# Patient Record
Sex: Male | Born: 1955 | Race: White | Hispanic: No | Marital: Single | Smoking: Former smoker
Health system: Southern US, Community
[De-identification: ages and names within clinical notes are randomized; demographics above are authoritative.]

## PROBLEM LIST (undated history)

## (undated) DIAGNOSIS — E785 Hyperlipidemia, unspecified: Secondary | ICD-10-CM

## (undated) DIAGNOSIS — I872 Venous insufficiency (chronic) (peripheral): Secondary | ICD-10-CM

## (undated) DIAGNOSIS — I10 Essential (primary) hypertension: Secondary | ICD-10-CM

## (undated) DIAGNOSIS — N200 Calculus of kidney: Secondary | ICD-10-CM

## (undated) DIAGNOSIS — G8190 Hemiplegia, unspecified affecting unspecified side: Secondary | ICD-10-CM

## (undated) DIAGNOSIS — G4733 Obstructive sleep apnea (adult) (pediatric): Secondary | ICD-10-CM

## (undated) DIAGNOSIS — K219 Gastro-esophageal reflux disease without esophagitis: Secondary | ICD-10-CM

## (undated) DIAGNOSIS — E669 Obesity, unspecified: Secondary | ICD-10-CM

## (undated) DIAGNOSIS — I639 Cerebral infarction, unspecified: Secondary | ICD-10-CM

## (undated) HISTORY — DX: Cerebral infarction, unspecified: I63.9

## (undated) HISTORY — DX: Essential (primary) hypertension: I10

## (undated) HISTORY — DX: Calculus of kidney: N20.0

## (undated) HISTORY — DX: Obstructive sleep apnea (adult) (pediatric): G47.33

## (undated) HISTORY — DX: Venous insufficiency (chronic) (peripheral): I87.2

## (undated) HISTORY — DX: Hyperlipidemia, unspecified: E78.5

## (undated) HISTORY — PX: LITHOTRIPSY: SUR834

## (undated) HISTORY — DX: Obesity, unspecified: E66.9

## (undated) HISTORY — PX: NASAL POLYP EXCISION: SHX2068

## (undated) HISTORY — DX: Gastro-esophageal reflux disease without esophagitis: K21.9

---

## 2000-10-17 ENCOUNTER — Encounter: Payer: Self-pay | Admitting: Urology

## 2000-10-18 ENCOUNTER — Ambulatory Visit (HOSPITAL_COMMUNITY): Admission: RE | Admit: 2000-10-18 | Discharge: 2000-10-18 | Payer: Self-pay | Admitting: Urology

## 2000-10-18 ENCOUNTER — Encounter: Payer: Self-pay | Admitting: Urology

## 2003-10-10 ENCOUNTER — Inpatient Hospital Stay (HOSPITAL_COMMUNITY): Admission: AD | Admit: 2003-10-10 | Discharge: 2003-10-12 | Payer: Self-pay | Admitting: *Deleted

## 2005-03-07 ENCOUNTER — Encounter: Admission: RE | Admit: 2005-03-07 | Discharge: 2005-06-05 | Payer: Self-pay | Admitting: Family Medicine

## 2006-11-09 ENCOUNTER — Ambulatory Visit: Payer: Self-pay | Admitting: Cardiology

## 2006-11-09 ENCOUNTER — Encounter: Payer: Self-pay | Admitting: Cardiology

## 2006-11-09 ENCOUNTER — Encounter (INDEPENDENT_AMBULATORY_CARE_PROVIDER_SITE_OTHER): Payer: Self-pay | Admitting: Neurology

## 2006-11-09 ENCOUNTER — Inpatient Hospital Stay (HOSPITAL_COMMUNITY): Admission: EM | Admit: 2006-11-09 | Discharge: 2006-12-06 | Payer: Self-pay | Admitting: Emergency Medicine

## 2006-11-19 ENCOUNTER — Ambulatory Visit: Payer: Self-pay | Admitting: Physical Medicine & Rehabilitation

## 2007-10-08 ENCOUNTER — Ambulatory Visit (HOSPITAL_BASED_OUTPATIENT_CLINIC_OR_DEPARTMENT_OTHER): Admission: RE | Admit: 2007-10-08 | Discharge: 2007-10-08 | Payer: Self-pay | Admitting: Family Medicine

## 2007-10-12 ENCOUNTER — Ambulatory Visit: Payer: Self-pay | Admitting: Internal Medicine

## 2010-08-14 ENCOUNTER — Encounter: Payer: Self-pay | Admitting: Neurology

## 2010-10-21 ENCOUNTER — Encounter: Payer: Self-pay | Admitting: Family Medicine

## 2010-10-21 DIAGNOSIS — I639 Cerebral infarction, unspecified: Secondary | ICD-10-CM | POA: Insufficient documentation

## 2010-10-21 DIAGNOSIS — G4730 Sleep apnea, unspecified: Secondary | ICD-10-CM

## 2010-10-21 DIAGNOSIS — E119 Type 2 diabetes mellitus without complications: Secondary | ICD-10-CM

## 2010-10-21 DIAGNOSIS — G4733 Obstructive sleep apnea (adult) (pediatric): Secondary | ICD-10-CM | POA: Insufficient documentation

## 2010-10-21 DIAGNOSIS — I10 Essential (primary) hypertension: Secondary | ICD-10-CM

## 2010-10-21 DIAGNOSIS — E785 Hyperlipidemia, unspecified: Secondary | ICD-10-CM

## 2010-12-06 NOTE — Procedures (Signed)
NAME:  Eduardo Young, Eduardo Young               ACCOUNT NO.:  [id number]   MEDICAL RECORD NO.:  [id number]          PATIENT TYPE:  OUT   LOCATION:  SLEEP CENTER                 FACILITY:  [facility]   PHYSICIAN:  Clinton D. Annamaria Boots, MD, FCCP, FACPDATE OF BIRTH:  06/01/58   DATE OF STUDY:  10/08/2007                            NOCTURNAL POLYSOMNOGRAM   REFERRING PHYSICIAN:   INDICATIONS FOR STUDY:  Hypersomnia with sleep apnea.   EPWORTH SLEEPINESS SCORE:  15/24.   BMI 32, weight 260 pounds, height 76 inches.  Neck:  18.5 inches.  </MEDI  CATIONS/>  Home medications are charted and reviewed.   SLEEP ARCHITECTURE:  Total sleep time:  239 minutes with sleep  efficiency 58.5%.  Stage 1 was 17.5%, stage 2 72%, stage 3 absent, REM  10.4% of total sleep time.  Sleep latency 31 minutes.  REM latency 344  minutes.  Awake after sleep onset 133 minutes.  Arousal index 39.3.   Bedtime medication included Glucophage, Zocor, Detrol LA.   RESPIRATORY DATA:  Apnea hypopnea index (AHI) is 78.2 events per hour  indicating severe obstructive sleep apnea/hypopnea syndrome.  There were  312 scored events including 53 obstructive apneas, 2 central apneas, 142  mixed apneas and 115 hypopneas.  Most events were associated with supine  sleep position.  REM AHI 52.8.  The patient qualified for CPAP titration  by split protocol.  He tolerated initial fitting for a large Mirage  Quattro full face mask at the beginning of the study.  When he had  qualified, and the technician began applying CPAP pressure, the patient  became anxious and complained of dyspnea despite multiple pressure and  mode adjustments including trial of bilevel.  Therefore, CPAP efforts  were discontinued, and the study was concluded as a diagnostic MPSG  protocol.   OXYGEN DATA:  Very loud snoring with oxygen desaturation to a nadir of  54%.  Mean oxygen saturation through the study was 90.8%.  A total of  61.3 minutes were spent with oxygen  saturation less than 61%.   CARDIAC DATA:  Sinus rhythm with frequent PVCs.   MOVEMENT/PARASOMNIA:  No movement disturbance.  Six restroom visits  including 2 urinals.   IMPRESSION/RECOMMENDATION:  1. Severe obstructive sleep apnea/hypopnea syndrome, AHI 78.2.  Mostly      was supine.  Snoring was very loud with significant oxygen      desaturation.  2. The patient qualified for CPAP titration but was unable to tolerate      initial application of pressures despite technician efforts.      Recommend that the patient return for daytime assistance at the      sleep center where the technician can work with him to desensitize      for CPAP and complete CPAP titration.  This will be arranged.  The      patient has some timing needs related to insurance that we will try      to expedite on his behalf.  3. Note significant oxygen desaturation with 61.3 minutes spent with      oxygen saturation less than 88% and a saturation nadir of 54%.  Supplemental oxygen with CPAP is likely to be helpful.  4. Technician indicates that the patient made 6 restroom trips and      required 2 urinals suggesting that nocturia related to his medical      problems and medication may be contributing to sleep disruption.      It may be helpful at least to change the time of day which      medications are taken if that is clinically applicable.      Clinton D. Annamaria Boots, MD, [facility], FACP  Diplomate, Tax adviser of Sleep Medicine  Electronically Signed     CDY/MEDQ  D:  10/12/2007 09:07:48  T:  10/12/2007 10:02:29  Job:  [id number]

## 2010-12-09 NOTE — Discharge Summary (Signed)
NAME:  Eduardo Young, Eduardo Young               ACCOUNT NO.:  [id number]   MEDICAL RECORD NO.:  [id number]          PATIENT TYPE:  INP   LOCATION:  3017                         FACILITY:  [facility]   PHYSICIAN:  Pramod P. Leonie Man, MD    DATE OF BIRTH:  Apr 18, 1956   DATE OF ADMISSION:  11/08/2006  DATE OF DISCHARGE:                               DISCHARGE SUMMARY   ADDENDUM   The patient was initially admitted November 09, 2006 and discharged Dec 06, 2006.   Initial discharge summary completed Nov 23, 2006 with diagnosis listed  there.  Since that dictation, the patient has had an additional modified  barium swallow which has increased him to a carb modified dysphagia-3  thin liquid diet.  The patient has also maintained continued elevated  blood pressures for which blood pressure medication adjustments have  been made.  Please see list below.  At this point he is medically stable  for discharge with no significant change in his neurologic exam.  He  needs continuation of PT, OT and speech therapy, a skilled nursing home  facility and likely long-term placement.   DISCHARGE MEDICATIONS:  Catapres patch 0.2 mg per 24 hours changed q.7  days on Tuesdays.  Glucophage 500 mg b.i.d.  Cardizem 240 mg a day.  Allopurinol 100 mg b.i.d.  Charcoal 48 mg a day.  Normodyne 300 mg b.i.d.  Lantus 28 units b.i.d.  Lisinopril 20 mg b.i.d.   There have been ongoing talks with patient's brother, who is power of  attorney, and sister surrounding his future discharge plans.  They are  in agreement with skilled nursing placing and arrangements are in place  for transfer today.      Burnetta Sabin, N.P.    ______________________________  Kathie Rhodes. Leonie Man, MD    SB/MEDQ  D:  12/06/2006  T:  12/06/2006  Job:  [id number]   cc:   Pramod P. Leonie Man, MD  Darletta Moll Bubba Hales, M.D.

## 2010-12-09 NOTE — Discharge Summary (Signed)
NAME:  Eduardo Young, Eduardo Young                         ACCOUNT NO.:  [id number]   MEDICAL RECORD NO.:  [id number]                   PATIENT TYPE:  INP   LOCATION:  2008                                 FACILITY:  [facility]   PHYSICIAN:  Jon Gills, M.D.             DATE OF BIRTH:  Dec 02, 1955   DATE OF ADMISSION:  10/09/2003  DATE OF DISCHARGE:  10/12/2003                                 DISCHARGE SUMMARY   DISCHARGE DIAGNOSES:  1. Atypical chest pain, most likely a combination of GI and/or     musculoskeletal in origin with negative cardiac enzymes and negative     Cardiolite.  2. High risk for coronary artery disease with strong family history in both     parents and both grandfathers, and also patient with significant risks,     including obesity, hypertension, some suggestion of impaired glucose     tolerance and hyperlipidemia.  3. Hyperlipidemia.  4. Gastroesophageal reflux disease.  5. Mild hypokalemia, based on his laboratory workup, thought secondary to     his diuretic intake.  6. Pulmonary nodules, as noted on CT scan, which will need followup in the     next two or three months.   DISCHARGE MEDICATIONS:  1. Zocor 20 mg p.o. q.h.s.  2. Ecotrin 81 mg p.o. q. day.  3. Norvasc 5 mg p.o. q. day.  4. Benicar/HCTZ  40/25 one p.o. q. day.  5. Lopressor 25 mg p.o. b.i.d.  6. Niacin 500 mg p.o. q. day.  He is to take his usual aspirin dose one-half     hour before niacin to minimize flushing.  7. Protonix 40 mg p.o. q. day and he can take this for another month since     his reflux is only intermittent.   ACTIVITY:  No restrictions.  The patient has been encouraged to get some  exercise into his daily routine.   DISCHARGE DIET:  Low salt, low cholesterol with possibility of diabetes type  related diet.   FOLLOWUP INSTRUCTIONS:  The patient is to follow up with Dr. Bubba Hales, his  primary care physician with [facility].  The patient told me at the time of  admission that he actually  had a visit on the 29th, but he tells me today  that he actually cancelled the appointment; therefore, I have advised him to  reschedule that appointment in the next one to two weeks, since Dr. Bubba Hales  has the results of the glucose tolerance test that he had done.  I am fairly  certain that this patient will test positive; if not now, but in the near  future for diabetes if he does not make any significant changes in his  lifestyle.   HOSPITAL COURSE:  This patient was admitted initially by mistake to the  [facility] and subsequently we at [facility] took over his care.  He had come in with complaints of epigastric chest  pain, described as  pushing in nature.  He said it radiated then to his left shoulder and down  into his arm.  In the arm, it was more like numbness.  He also had a sense  of numbness in his left leg.  He did get some diaphoresis, but denied any  other accompanying symptoms.  He denied any chest tightness or pressure,  like what he had when he first presented with elevated blood pressure and  was diagnosed with hypertension.  The patient has significant cardiac risk  factors, including his age and gender and also a very strong family history,  as previously mentioned.  He also has hypertension, hyperlipidemia, obesity  and a very strong suggestion for impaired glucose tolerance.  The patient  was admitted and placed on telemetry.  He ruled out for a myocardial  infarction by serial enzymes and all of his CPKs were less than 100 and  troponins were insignificant also.  He underwent a Cardiolite stress test  and Dr. Martinique just gave me information that that was negative for any  ischemic changes.  There was some suggestion of low attenuation per what he  told me in the inferior wall, but no significant defects were noted.  He had  an EF of 53% with normal wall movements.  Because of the nature of his  complaints, I did do a CT of the chest, abdomen and pelvis to  rule out  aortic dissection and that was negative for aortic dissection.  However, of  mention was pulmonary nodules that were scattered in the lung.  The  recommendation is that he have repeat CT scan in two to three months.   For his hypertension, when he first came in, he was unsure of the medication  doses for his Benicar and hydrochlorothiazide and he was unsure what his  blood pressure medications were.  Therefore, once we found that out, the  patient was switched to Benicar and hydrochlorothiazide, but kept on  metoprolol, but decreased the dose down to 25 p.o. b.i.d.  We also restarted  him back on his Norvasc.  His blood pressure at the time of discharge is  more reasonable at 150/90.  It was certainly more elevated at the time when  he first came in.   The patient's fasting lipid panel was checked and that has come back  elevated with a total cholesterol of  227, triglyceride of 226, HDL of 25,  LDL of 157.  For this reason and because of his risk factors, he was started  on both Zocor and niacin.  Hopefully, this combination will be sufficient in  bringing all of the appropriate numbers down.  It is possible that he may  need a fibric acid group like TriCor or Lopid added to his regimen or his  niacin increased.  We will leave this to the discretion of his primary care  physician.   For his gastroesophageal reflux disease, which he described as  intermittently only, he may go through periods where he has no symptoms and  then he may have it for three or four days at a time.  Because of the  atypical nature in which he presented, we placed him on Protonix and I  advised the patient to complete out the month's treatment at least.  After  stopping the Protonix, if he has recurrence of symptoms, we will leave it to  the discretion of his primary care physician whether he will need to have  any further GI workup prior to just placing him back on a proton pump  inhibitor.   In  regards to the question of diabetes, the patient had described what  sounded like a glucose tolerance test that was done by his primary care  physician.  We do not have the results of it.  We did get fasting sugars  checks on him and they have been variable, some that were higher than 126,  as this morning's was at 132.  However, some of his certainly have been  greater than 100.  His hemoglobin A1P was 6.6, so certainly not quite at the  criteria to make the diagnosis of diabetes mellitus.  I have advised the  patient once again to make sure that he reschedules his appointment with his  primary care physician to endure that the issue of whether he is a diabetic  or not be addressed and to make sure that all things are going well post  hospitalization.   For his mild hypokalemia, he has been given replacements and on the day of  discharge, his potassium was 3.4, which was minimally decreased.  We will  give him KCL, a total of 80 mEq over a course of about a half an hour to 45  minutes, which should be sufficient in getting his potassium back up.  I  would recommend a BMP at the time of followup with his primary care  physician.   Once again, the patient was stressed the importance of risk modification for  minimizing his risks for coronary artery disease.  I have told him that he  is amazingly at high risk with all of his risk factors.  The patient, I  hope, takes it to heart and makes some significant change in his lifestyle,  which I understand he has already done some thereof.   Dr. Martinique, the cardiologist, was consulted on his case and he is the one  who gave [place] the information on his Cardiolite stress test.   The patient is being discharged to home in stable condition.  He has friends  here who were visiting with him.                                                Jon Gills, M.D.    RRV/MEDQ  D:  10/12/2003  T:  10/14/2003  Job:  [id number]   cc:   Darletta Moll. Bubba Hales,  M.D.  [address]  [place]  [place] [address]  Fax: [phone]

## 2010-12-09 NOTE — H&P (Signed)
NAME:  ADDEN, STROUT NO.:  [id number]   MEDICAL RECORD NO.:  [id number]          PATIENT TYPE:  EMS   LOCATION:  MAJO                         FACILITY:  [facility]   PHYSICIAN:  Jill Alexanders, M.D.  DATE OF BIRTH:  [birth year]   DATE OF ADMISSION:  11/08/2006  DATE OF DISCHARGE:                              HISTORY & PHYSICAL   HISTORY OF PRESENT ILLNESS:  Eduardo Young is a 55 year old left-handed  white male born 06-Jun-1956 with a history of hypertension, diabetes,  dyslipidemia who comes to the [facility] emergency room after being  found by his family at home.  The patient was last seen normal on November 06, 2006.  The patient was to go to his brother's house to watch TV but  never showed up.  The brother went to the patient's house broke in and  found him on the floor having urinated, aphasic, with right-sided  weakness.  The patient was brought to the hospital for evaluation.  CT  scan of the brain shows a 50 x 27 x 45 mm intracranial hemorrhage in the  left basal ganglia area.  Patient is being admitted at this point for  further evaluation.  NIH stroke scale is 16.   PAST MEDICAL HISTORY:  The past medical history is significant for:  1. 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  2. Hypertension.  3. Diabetes.  4. Obesity.  5. Gout.  6. Renal calculi.  7. Gastroesophageal reflux disease.  8. Atypical chest pain in the past.  9. Dyslipidemia.  10.History of pulmonary nodules by previous examination.   MEDICATIONS:  The patient is on allopurinol 100 mg b.i.d., diltiazem 240  mg daily, hydrochlorothiazide 12.5 mg daily, lisinopril 20 mg b.i.d.,  metformin 500 b.i.d., possibly is on Sular 40 mg daily, labetalol 200 mg  twice daily, aspirin 325 mg daily.   ALLERGIES:  Patient has no known allergies.   HABITS:  He does not smoke.  He drinks alcohol on occasion.  Has in the  past used cocaine  and marijuana.  The family does not believe he is  currently using this.   PRIMARY CARE PHYSICIAN:  Primary medical doctor is Granville Lewis, M.D.   SOCIAL HISTORY:  This patient is divorced x2, lives alone in the  [place],  [place] area and has no children.  The patient has  not worked since November 2007.  He used to be an Medical illustrator.   FAMILY HISTORY:  Family medical history notable in that the mother died  with an MI at age 57.  Father died of stroke at the age of 37.  The  patient has three sisters and one brother, history of hypertension,  diabetes in the family, high cholesterol in the family.   REVIEW OF SYSTEMS:  The review of systems is essentially unobtainable.  The patient is nonverbal.  The family knows that in the last week the  patient had a bout of kidney stones with severe pain with this.  Otherwise, the review  of systems is unknown.   PHYSICAL EXAMINATION:  VITALS:  Blood pressures 205/106, heart rate 88,  respiratory rate 20, temperature 99.4 rectally.  GENERAL:  In general, this patient is a moderately obese white male who  is sleepy but can be easily aroused at the time examination.  HEENT:  On examination the head is atraumatic.  Eyes: Pupils are equal,  round and react to light.  Disks are flat bilaterally.  NECK:  Neck is supple.  No carotid bruits noted.  RESPIRATORY:  Examination is clear.  CARDIOVASCULAR:  Examination reveals a regular rate and rhythm.  No  obvious murmurs or rubs noted.  EXTREMITIES:  Extremities are without significant edema.  ABDOMEN:  Is obese, positive bowel sounds.  No organomegaly or  tenderness noted.  NEUROLOGICAL:  On neurologic examination the cranial nerves are as  above.  Mild depression of the right nasolabial fold noted.  Patient has  full extraocular movements in horizontal plane; does not really blink to  threat very well from either side.  The patient has a plegic right upper  extremity, has good strength in  the left upper extremity.  There is  drift on the right lower extremity.  The patient however is able to  maintain the leg up off the bed at the count of 5.  The patient has good  strength on the left leg.  Sensory testing is difficult.  The patient  does not really respond well on either side to deep pain stimulation but  probably less well on the right as compared to the left.  Deep tendon  reflexes are fairly symmetric.  There appears to be an upgoing toe on  the right and downgoing the left.  Patient is able to perform finger-  nose-finger with the left arm and heel-to-shin with left leg. He cannot  perform this well on the right side.  The patient has no obvious ataxia.  The patient could not be ambulated.   LABORATORY:  Values notable for white count of 13.1, hemoglobin of 14.1,  hematocrit 40.6, MCV of 79.2, platelets of 259,000.  INR 1.0.  Sodium  136, potassium 3.7, chloride 104, CO2 25, glucose of 247, BUN of 14,  creatinine 1.24.  Calcium 8.4. CK level of 732. Troponin- I less than  0.05, MB fraction 2.9.  Urinalysis reveals specific gravity of 1.029.  Urine glucose greater than 1000 mg/dL, 15 mg/dL ketones, moderate blood,  protein greater than 300, 0-2 white cells, 3-6 red cells.   CLINICAL DATA:  A CT of the head is as above.  Chest x-ray and EKG are  pending.  Urine drug screen pending.   IMPRESSION:  1. Left basal ganglia intracranial hemorrhage without any      intraventricular extension.  2. Hypertension.  3. Diabetes.  4. Dyslipidemia.   DISCUSSION:  This patient clearly has a significant deficit with right  hemiparesis and aphasia.  NIH stroke scale is 16.  Patient is not a TPA  candidate due to intracranial hemorrhage.  The patient was last seen  normal on November 06, 2006.  The patient is brought in for further  evaluation at this point.   PLAN:  1. Admission to [facility] intensive care unit 3100 bed. 2. Repeat CT scan in 12 hours.  3. Blood  pressure control.  4. N.p.o. for now.  5. Bedside swallow protocol.  6. Urine drug screen.  7. Physical and occupational therapy evaluation.  8. To get chest x-ray and EKG.  9. Follow the patient's clinical course while in-house.      Jill Alexanders, M.D.  Electronically Signed     CKW/MEDQ  D:  11/09/2006  T:  11/09/2006  Job:  [id number]   cc:   Darletta Moll. Bubba Hales, M.D.

## 2010-12-09 NOTE — H&P (Signed)
NAME:  Eduardo Young, Eduardo Young                         ACCOUNT NO.:  [id number]   MEDICAL RECORD NO.:  [id number]                   PATIENT TYPE:  INP   LOCATION:  1823                                 FACILITY:  [facility]   PHYSICIAN:  Jon Gills, M.D.             DATE OF BIRTH:  Jul 04, 1956   DATE OF ADMISSION:  10/10/2003  DATE OF DISCHARGE:                                HISTORY & PHYSICAL   ID STATEMENT:  This is a 55 year old gentleman whose primary care physician  is Dr. Bubba Hales with [facility].  His significant past medical history is for  hypertension.   CHIEF COMPLAINT:  Epigastric pain with radiation to his left upper extremity  with numbness and into his left leg.   HISTORY OF PRESENT ILLNESS:  The patient notes that last night he was  sitting and watching TV when he developed the above mentioned symptoms.  The  patient says the epigastric pain feels like something pushing up into it.  The patient then notes that the pain radiated to his left arm and became a  numbness into his left upper extremity and into his left leg.  He did get  some diaphoresis with it but denies any shortness of breath.  The patient  has not had similar symptoms but felt that he has had heartburn episodically  and he may go through periods of three or four days at a time when he may  have significant heartburn and he may take Tums or any other over-the-  counter preparations.   The patient had a sense of chest pressure during the time of diagnosis of  hypertension but he has not had this at all with these current symptoms.   CARDIAC RISK FACTORS:  His gender, his age.  He has a very strong family  history consisting of both his parents dying at the age of 65 from a heart  attacks.  Both paternal grandfathers also had heart attacks in their 27s.  He does have a history of hypertension.  He is not a smoker although he does  marijuana.  He denies any cocaine use.  He is unsure of his cholesterol  value nor his  diabetes status, although he describes what sounds like a 2-  hour glucose tolerance test.   ALLERGIES:  NO KNOWN DRUG ALLERGIES.   CURRENT MEDICATIONS:  The patient says he is on a combination of  Benicar/hydrochlorothiazide which was just started two days ago.  He was  taken off of Trandate at the same time.  He was also taken off of Sular  about six months ago but he cannot remember the name of the medication it  was replaced with but he says it was a once a day dosing.  He also was just  prescribed colchicine and allopurinol for recent acute gout but he has not  started on these.  He does not take any regular aspirin.  PAST MEDICAL HISTORY:  Significant for hypertension and he says he recently  had some extensive blood work and he is scheduled to see Dr. Bubba Hales in  clinic next week for an extensive physical exam.   SOCIAL HISTORY:  He is not married.  He works as an Medical illustrator.  He  seldom drinks alcohol, he says; the brother thought he drank more, but the  patient certainly denies that he may have a few drinks once a month or so.  He does not smoke cigarettes and has not done so since the early 20s.  He  does do marijuana, denies any cocaine use.   FAMILY HISTORY:  There are four siblings.  Both his parents died in their  late 36s from myocardial infarction, also both his paternal grandfathers  died in their 21s from myocardial infarction.  There is diabetes mellitus in  the father and question of whether the mother had it.  The mother was obese.  There is hypertension in everybody.  There is stroke also in the early 26s  in the father.  There is no known prostate, colon, or breast cancer.   REVIEW OF SYSTEMS:  As per HPI.  He otherwise denies a cough, denies any  melena or hematochezia.  Denies any urinary symptoms except he says over the  past day or so he has had some frequency of urination for which he thinks is  related to increase of some of his fluids.   PHYSICAL  EXAMINATION:  VITAL SIGNS:  His latest blood pressure was 180/111,  he had just received metoprolol.  His pulse early on was 58, but it has  ranged from 80-88.  He is saturating at 98% on room air.  GENERAL:  He is in no apparent distress.  He had significant abdominal  obesity.  He is a very tall man.  HEENT:  Sitting upright he has a fat neck.  I did not check the JVD at this  level.  LUNGS:  Clear to auscultation bilaterally without any crackles or wheezes.  HEART:  Regular rate and rhythm.  ABDOMEN:  Significantly obese.  Bowel sounds are normal, soft, nontender in  all quadrants.  No epigastric tenderness either.  LOWER EXTREMITIES:  He has no edema.   LABORATORY WORKUP:  His electrolytes on I-stat showed a sodium of 142,  potassium 3.3, chloride 103, glucose 159.  His white count was 12,200, H&H  of 13.6 and 40.1, MCV is 80.5, MCHC 33.9, platelet count 372,000.   His chest x-ray showed no acute process.  I believe there was no  cardiomegaly noted also.   His initial cardiac markers were negative but this troponin at less than  0.05.  His repeat CPK came back at 91.  The troponin was ordered but I still  do not see one.  It was to have been done with the CPK at 9:20 this morning.  We will go ahead and do one now which is almost time for his next CPK to be  done.   His EKG showed normal sinus rhythm with heart rate of about 78.  The only  abnormality that I see is very minor ST suppression in the lateral leads.   ASSESSMENT/PLAN:  1. Atypical chest pain but with significant cardiac risk factors with very     strong family history with him being hypertensive and obese, question of     diabetes mellitus.  The patient will be admitted for the rule out MI  protocol which sounds like he has ruled out.  One of my concerns is also     for the nature of his complaint, whether he could have dissection of the    aorta so we will set him up for a CT of the chest.  In the meantime, we      will do the usual beta blockers which he has already received in the ER     for his blood pressure.  Continue his aspirin.  We will hold on Plavix at     that time.  We will place him on O2, give him nitroglycerin as needed.  I     have called and spoken to Dr. Martinique who will also come in and see     patient.  The plan is to at this point in time set him up for Cardiolite     stress test rather than doing CT per my discussion over the phone with     Dr. Martinique; this may change depending on what Dr. Martinique thinks.  We will     also do a fasting lipid panel in the morning and make adjustments based     on that.  The patient has been given the importance of risk modifications     considering his high risk for atherosclerosis and the patient seems to     verbalize  understanding of this.  2. Hypertension.  He has been started on beta blockers.  It is possible that     his blood pressure is elevated primarily because he was taken off of the     labetalol  so we are seeing some effect of that.  We will also place him     on an ACE inhibitor.  I am holding off on his hydrochlorothiazide at this     point in time with concerns of his acute gout history.  3. Some of his symptoms may suggest some reflux, therefore, I will do     Protonix b.i.d. dosing for a few days and then come back to once a day     dosing.  4. In regards to whether he might have diabetes mellitus with some frequency     of urination, we will do a UA just to make sure he does not have a     urinary tract infection if one has not been done and I do not see one.     In the meantime, we will check his sugar on the BMP in the morning and     see what it is.  I might set him up for a     hemoglobin A1c also __________ , what sounds like a most recent 2-hour     glucose tolerance test.  5. In regards to his recent gout.  We will place him on colchicine and see     how he does.  He says he normally gets his gout attacks in the feet  and     hands.                                                Jon Gills, M.D.    RRV/MEDQ  D:  10/10/2003  T:  10/12/2003  Job:  [id number]   cc:   Darletta Moll. Bubba Hales, M.D.  [address]  [place]  [place] [address]  Fax: [phone]

## 2010-12-09 NOTE — Discharge Summary (Signed)
NAME:  Eduardo Young, Eduardo Young               ACCOUNT NO.:  [id number]   MEDICAL RECORD NO.:  [id number]          PATIENT TYPE:  INP   LOCATION:  3017                         FACILITY:  [facility]   PHYSICIAN:  Legrand Como L. Reynolds, M.D.DATE OF BIRTH:  Jun 03, 1956   DATE OF ADMISSION:  11/08/2006  DATE OF DISCHARGE:  11/23/2006                               DISCHARGE SUMMARY   ADMISSION DIAGNOSES:  1. 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  2. Hypertension.  3. Diabetes.  4. Dyslipidemia.  5. Obesity.  6. Gout.  7. History of renal calculi.  8. History of gastroesophageal reflux disease.  9. History of pulmonary nodules.   DISCHARGE DIAGNOSES:  1. 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  2. Hypertension.  3. Diabetes.  4. Dyslipidemia.  5. Obesity.  6. Gout.  7. History of renal calculi.  8. History of gastroesophageal reflux disease.  9. History of pulmonary nodules.   DIET AT DISCHARGE:  Dysphagia 3 nectar thick liquids, moderate carb  modified with small sips and supervision.   ACTIVITY AT DISCHARGE:  Out of bed with assistance only.   MEDICATIONS AT DISCHARGE:  1. Catapres patch 0.2 mg weekly.  2. Glucophage 500 mg b.i.d.  3. Lisinopril 10 mg b.i.d.  4. Cardizem CD 240 mg daily.  5. Allopurinol 100 mg b.i.d.  6. Tri-Chlor 48 mg daily.  7. Labetalol 300 mg b.i.d.  8. Lantus insulin 25 units b.i.d.  9. Sliding scale insulin.   STUDIES:  1. CT of the head performed November 09, 2006 demonstrating focal      hematoma in the left cerebral hemisphere and the basal ganglia      measuring 5.2 x 2.7 cm with 6 mm midline shift.  No      intraventricular extension and no hydrocephalus.  2. Serial CTs on April 18, April 20 and April 22 demonstrating      stability of the intracerebral hematoma.  3. Serial chest x-rays April 18, April 21 and April 22 demonstrating  cardiomegaly without acute chest disease.  4. 2D echocardiogram done November 09, 2006 demonstrating normal EF, 55%      to 85% normal LV systolic function, limited windows but no definite      wall motion abnormalities.  5. Carotid Doppler November 09, 2006 demonstrating 40% to 50% ICA      stenosis low in the scale in the proximal ICA region, likely due to      tortuosity rather than plaque.   LABORATORY REVIEW:  CBC on admission:  White count 13.1, hemoglobin  14.1, platelets 259,000 with white counts in the 10-13 range throughout  admission.  Serial chemistries demonstrating elevated glucose's, mildly  elevated BUN with stable creatinine, hemoglobin A1c 10.6, homocysteine  level normal at 14.5.  Lipid panel:  Triglycerides elevated at 309, HDL  low at 20, total cholesterol 191.  Urinalysis on admission demonstrating  hemoglobin, much glucose, no evidence of infection.  Urinalysis on April  22 demonstrating evidence of a urinary tract  infection.  Blood cultures  on April 21 demonstrating no growth.   HOSPITAL COURSE:  Please see admission History and Physical by Dr. Floyde Parkins on November 08, 2006 for full admission details.  Briefly, this is a  55 year old left-handed man with a history of diabetes, hypertension and  dyslipidemia who was found down by his family at home with aphasia and  right hemiparesis with an unknown time of onset.  He was brought to the  emergency department.  CT of the head demonstrated a 5 x 2.5 x 4.5 cm  intracranial hemorrhage in the left basal ganglia area with midline  shift.  He was admitted to the Stroke Service.  He had serial CT scans  which demonstrated stability of the hematoma.  He was initially admitted  to the ICU but transferred to Step-Down on April 19.  Routine stroke  workup was performed and was as noted above.  He had significantly  elevated blood pressures throughout his early hospitalization which were  controlled with an intravenous Cardene  drip.  He was seen at this point  by physical, occupational and speech therapy and underwent swallow  evaluation which he initially failed.  He was able to take some p.o.'s,  but he was noted to be fairly impulsive and having difficulty doing  that.  He was transferred to the floor on November 12, 2006, but shortly  after arriving there was found to be tachypneic and febrile.  He was  transferred back to 3100 where he was started on antibiotics for  presumptive pneumonia or aspiration pneumonitis.  He was started on  vancomycin and Rocephin.  Urinalysis demonstrated evidence of infection.  His white count was slightly elevated.  Chest x-ray remained clear  without evidence of CHF.  He also had some swelling of the left knee and  had an effusion on his x-ray.  He remained on Zosyn and was placed on  prednisone taper and Ultram for presumed gout in the knee.  He was  transferred back to the floor on April 23, at which time his knee was  feeling better.  His IV antibiotics were discontinued and he was started  on Bactrim for UTI, which he remained on through Nov 22, 2006.  On November 16, 2006 he pulled out his Loni Muse and was refusing reinsertion.  At that  time he was started on a dysphagia 1 diet and actually was able to work  up to a dysphagia 3 diet with Speech Pathology clearance.  He was  evaluated by the Inpatient Rehab Service but was thought not to be a  good rehab candidate because he could not be cared for by his family at  home and therefore Skilled Nursing placement was entertained.  His blood  pressure remained well controlled on oral medications.  He did tolerate  his diet reasonably well, although he was noted still to be impulsive  and required very close supervision.  He was noted by Occupational  Therapy on November 21, 2006 to have a possible anterior subluxation of his  right shoulder.  By Nov 22, 2006 he had received a bed offer and on Nov 23, 2006 he was deemed medically stable and  ready for discharge.  Discharge is anticipated on this date.      Michael L. Doy Mince, M.D.  Electronically Signed    MLR/MEDQ  D:  11/23/2006  T:  11/23/2006  Job:  [id number]

## 2010-12-09 NOTE — H&P (Signed)
NAME:  Eduardo Young, Eduardo Young                         ACCOUNT NO.:  [id number]   MEDICAL RECORD NO.:  [id number]                   PATIENT TYPE:  INP   LOCATION:  2008                                 FACILITY:  [facility]   PHYSICIAN:  Annette Stable, M.D.               DATE OF BIRTH:  [birth year]   DATE OF ADMISSION:  10/09/2003  DATE OF DISCHARGE:                                HISTORY & PHYSICAL   PRIMARY PHYSICIAN:  Philipp Deputy, M.D.   CHIEF COMPLAINT:  Epigastric and retrosternal pain.   HISTORY OF PRESENT ILLNESS:  Patient is 55 year old Caucasian gentleman who  experienced epigastric pain radiating up his chest and into his left arm  while watching TV at about 2200 hours last night.  He describes the pain as  mild and rates it on an analog scale as 2/10.  Pain was very short lasting  and fleeting in nature.  He was however, concerned about the numbness of his  left arm which was associated with the pain.  He also states that he broke  out in a sweat.  He denies any nausea or vomiting.  He had no shortness of  breath.  He has not had this experience before.  He was accompanied by two  friends to the emergency room to be seen.   PAST MEDICAL HISTORY:  1. Hypertension.  2. Gouty arthritis.  He has recently been seen for gout that involves mainly     his left arm and wrist.   ALLERGIES:  NKDA.   MEDICATIONS:  He takes two blood pressure medications.   FAMILY HISTORY:  Both parents died from coronary disease in their 25s.  His  father also suffered multiple CVAs.   SOCIAL HISTORY:  Patient is an Medical illustrator.  He denies any smoking or  use of alcohol.   REVIEW OF SYSTEMS:  GENERAL:  Negative for malaise, fever, chills.  NEURO:  No headaches.  CARDIORESPIRATORY:  As stated above in history of present  illness.  GASTROINTESTINAL:  Gastric pain; no nausea/vomiting, no change in  bowel habits.  GENITOURINARY:  No dysuria or hematuria.  MUSCULOSKELETAL:  Pain of gout intermittently  in left wrist as well as other joints.  All  other systems reviewed negative.   PHYSICAL EXAMINATION:  GENERAL:  This is an obese middle-aged adult laying  supine; no acute distress.  VITAL SIGNS:  Temperature is 97.8, blood pressure 149/86, heart rate 80,  respiratory rate 18 per minute.  HEENT:  Exam unremarkable.  NECK:  Obese, supple; no distended veins, no carotid bruits.  LUNGS:  Good bilateral air movement, clear.  CARDIOVASCULAR:  Heart sounds 1 and 2 heard; no murmurs or gallops.  ABDOMEN:  Obese, soft, nontender, normal bowel sounds.  CNS:  Alert/oriented x3 with no focal deficits.  EXTREMITIES:  No pedal edema; good peripheral pulses.   LABORATORIES:  Patient's EKG is in normal sinus rhythm with subtle  ST  depressions in lateral leads and inverted T waves in V6.  White count is  slightly elevated at 12,000, hemoglobin 13, hematocrit 40, platelet count  332.  Troponin is less than 0.05, other cardiac markers are negative.   ASSESSMENT AND PLAN:  Chest pain:  Patient's description of pain is somewhat  atypical for coronary disease.  He currently remains painfree.  I have  ordered four baby aspirins to be given to him down in the emergency room.  He will be admitted to a telemetry bed for observation.  We will rule him  out for myocardial ischemia by serial enzymes and order a stress Cardiolite  exam for him after he rules out.  I have also ordered a fasting lipid panel  on him.  His blood pressure pills are unknown and I will start him on a beta  blocker for blood pressure control pending confirmation of his usual  medications.  Patient remains afebrile and has a slightly elevated white  count which may be stress induced.  There is currently no source of  infection identified.  He has a pending portable chest x-ray.  My suspicion  for any respiratory infection is however, very low.                                                Annette Stable, M.D.    ES/MEDQ  D:   10/10/2003  T:  10/12/2003  Job:  [id number]   cc:   Harden Mo, M.D.  [place] [address]  [place]  [place] [address]  Fax: [phone]

## 2010-12-09 NOTE — Consult Note (Signed)
NAME:  Eduardo Young, Eduardo Young                         ACCOUNT NO.:  [id number]   MEDICAL RECORD NO.:  [id number]                   PATIENT TYPE:  INP   LOCATION:  2008                                 FACILITY:  [facility]   PHYSICIAN:  Peter M. Martinique, M.D.               DATE OF BIRTH:  07-21-1956   DATE OF CONSULTATION:  10/10/2003  DATE OF DISCHARGE:                                   CONSULTATION   HISTORY OF PRESENT ILLNESS:  Mr. Eskew is a 55 year old white male with a  history of obesity, gout and hypertension who presented to the emergency  room last evening.  The patient had eaten a meal and was smoking marijuana  when he developed a boring type epigastric pain radiating to his left  shoulder.  He had no associated chest pain.  He then developed tingling in  his left arm and hand and tingling of his left thigh.  He had no nausea,  vomiting or diarrhea.  He did have diaphoresis.  He denied shortness of  breath.  These symptoms lasted 30-45 minutes and resolved.  He states he is  now pain-free.   PAST MEDICAL HISTORY:  Is significant for:  1. Hypertension.  2. Gout.  3. Renal calculi.  4. Obesity.  5. GERD.  6. Glucose intolerance.   CURRENT MEDICATIONS INCLUDE:  Benicar HCT, colchicine and allopurinol.   ALLERGIES:  NO KNOWN ALLERGIES.   SOCIAL HISTORY:  He is single.  He denies tobacco use.  He does smoke  occasional marijuana.  He denies alcohol use.   FAMILY HISTORY:  Is positive for coronary disease in both parents in their  early 32s and also his grandparents.   REVIEW OF SYSTEMS:  Is otherwise unremarkable.  He has had some recent gouty  symptoms.  He has had previous stone extraction for renal calculi.  Other  review of systems are negative.   PHYSICAL EXAM:  Patient is an obese young white male in no apparent  distress.  Blood pressure 145/92, pulse is 88, he is afebrile, respirations  are 20.  HEENT EXAM:  Pupils equal, round, reactive.  Conjunctiva are clear.  Oropharynx is clear.  NECK:  Is without JVD, adenopathy or bruits.  LUNGS:  Are clear.  CARDIAC EXAM:  Reveals regular rate and rhythm without murmurs, rubs,  gallops or clicks.  ABDOMEN:  Is soft and nontender and obese.  EXTREMITIES:  Are without edema.  Pulses are 2+ and symmetric.  NEUROLOGIC EXAM:  Is nonfocal.   LABORATORY DATA:  Hemoglobin is 13.6, sodium 142, potassium 3.3, chloride  103, CO2 30, BUN 18, creatinine 1.3, glucose of 159.  Cardiac enzymes are  negative x2.  Chest x-ray is normal.  ECG shows normal sinus rhythm with  mild T-wave inversion laterally.   IMPRESSION:  1. Atypical chest pain.  2. Hypertension.  3. Gout.  4. Obesity.  5. Gastroesophageal reflux disease.  6. Family history of early coronary disease.   PLAN:  I agree with plans for CT scan of the chest tonight.  If this is  unremarkable will plan to do a two day stress Cardiolite study in the a.m.                                               Peter M. Martinique, M.D.    PMJ/MEDQ  D:  10/10/2003  T:  10/11/2003  Job:  [id number]   cc:   Jon Gills, M.D.   Darletta Moll Bubba Hales, M.D.  [address]  [place]  [place] [address]  Fax: [phone]

## 2010-12-09 NOTE — Discharge Summary (Signed)
NAME:  Eduardo Young, Eduardo Young               ACCOUNT NO.:  [id number]   MEDICAL RECORD NO.:  [id number]          PATIENT TYPE:  INP   LOCATION:  3017                         FACILITY:  [facility]   PHYSICIAN:  Pramod P. Leonie Man, MD    DATE OF BIRTH:  01/11/1956   DATE OF ADMISSION:  11/08/2006  DATE OF DISCHARGE:                               DISCHARGE SUMMARY   ERASE DICTATION      Burnetta Sabin, N.P.    ______________________________  Kathie Rhodes. Leonie Man, MD    SB/MEDQ  D:  11/29/2006  T:  11/29/2006  Job:  [id number]   cc:   Darletta Moll. Bubba Hales, M.D.

## 2011-10-09 DIAGNOSIS — E119 Type 2 diabetes mellitus without complications: Secondary | ICD-10-CM | POA: Diagnosis not present

## 2011-10-09 DIAGNOSIS — E109 Type 1 diabetes mellitus without complications: Secondary | ICD-10-CM | POA: Diagnosis not present

## 2011-10-09 DIAGNOSIS — I10 Essential (primary) hypertension: Secondary | ICD-10-CM | POA: Diagnosis not present

## 2011-10-09 DIAGNOSIS — E785 Hyperlipidemia, unspecified: Secondary | ICD-10-CM | POA: Diagnosis not present

## 2012-01-19 ENCOUNTER — Ambulatory Visit (INDEPENDENT_AMBULATORY_CARE_PROVIDER_SITE_OTHER): Payer: Medicare Other | Admitting: Internal Medicine

## 2012-01-19 VITALS — BP 144/81 | HR 78 | Temp 98.2°F | Resp 18

## 2012-01-19 DIAGNOSIS — L03119 Cellulitis of unspecified part of limb: Secondary | ICD-10-CM | POA: Diagnosis not present

## 2012-01-19 DIAGNOSIS — M25579 Pain in unspecified ankle and joints of unspecified foot: Secondary | ICD-10-CM

## 2012-01-19 DIAGNOSIS — L02419 Cutaneous abscess of limb, unspecified: Secondary | ICD-10-CM

## 2012-01-19 MED ORDER — DOXYCYCLINE HYCLATE 100 MG PO TABS: 100.0000 mg | ORAL_TABLET | Freq: Two times a day (BID) | ORAL | Status: AC

## 2012-01-19 NOTE — Patient Instructions (Addendum)
This is an abscess Hot compresses are needed frequently to encourage further drainage Doxycycline is to be taken twice a day If not much better in 48-72 hours we should recheck the wound We will call with culture report to be sure this is the right antibiotic

## 2012-01-19 NOTE — Progress Notes (Signed)
  Subjective:    Patient ID: Eduardo Young, male    DOB: 02-14-1956, 56 y.o.   MRN: [id number]  HPIThinks he has a spider bite on his left leg/painful swollen area times 24-hour/no fever/no drainage/no itching    Review of Systems Patient Active Problem List  Diagnosis  . CVA (cerebral infarction)  . DM (diabetes mellitus)  . HLD (hyperlipidemia)  . HTN (hypertension)  . Sleep apnea  Primary care Dr. Dennard Schaumann      Objective:   Physical Exam In no acute distress 4 cm x 3 cm induration with redness and tenderness on the posterior aspect of the upper left thigh/slight pus expressible and cultured Numerous areas of dermatitis with excoriation over the lower extremities with redness and superficial infection perhaps but no abscesses and no cellulitis  Procedure by [place]:  Problem #1 cellulitis left thigh Cultured Doxycycline 100 twice a day #20 Frequent warm compresses to continue drainage Recheck in 72 hours if not progressing Copy note to Dr Dennard Schaumann

## 2012-01-19 NOTE — Progress Notes (Signed)
   Patient ID: DARREK LEASURE MRN: [id number], DOB: [birth year], 56 y.o. Date of Encounter: 01/19/2012, 11:49 AM    PROCEDURE NOTE: Verbal consent obtained. Alcohol prep. Local anesthesia obtained with 2% lidocaine plain  1 cm incision made with 11 blade along lesion.  Culture taken. Minimal purulence expressed. Lesion explored revealing no loculations. Wound not packed due to no cavity.  Dressed. Wound care instructions including precautions with patient. Patient tolerated the procedure well. Recheck if not responding     Signed, Georgiann Mccoy, PA-C 01/19/2012 11:49 AM

## 2012-01-20 ENCOUNTER — Encounter: Payer: Self-pay | Admitting: Internal Medicine

## 2012-01-22 ENCOUNTER — Encounter: Payer: Self-pay | Admitting: Internal Medicine

## 2012-01-22 LAB — WOUND CULTURE: Gram Stain: NONE SEEN

## 2012-01-23 DIAGNOSIS — L608 Other nail disorders: Secondary | ICD-10-CM | POA: Diagnosis not present

## 2012-01-24 DIAGNOSIS — A4902 Methicillin resistant Staphylococcus aureus infection, unspecified site: Secondary | ICD-10-CM | POA: Diagnosis not present

## 2012-01-24 DIAGNOSIS — L02419 Cutaneous abscess of limb, unspecified: Secondary | ICD-10-CM | POA: Diagnosis not present

## 2012-06-04 DIAGNOSIS — E109 Type 1 diabetes mellitus without complications: Secondary | ICD-10-CM | POA: Diagnosis not present

## 2012-06-04 DIAGNOSIS — L0291 Cutaneous abscess, unspecified: Secondary | ICD-10-CM | POA: Diagnosis not present

## 2012-06-04 DIAGNOSIS — I10 Essential (primary) hypertension: Secondary | ICD-10-CM | POA: Diagnosis not present

## 2012-06-04 DIAGNOSIS — E785 Hyperlipidemia, unspecified: Secondary | ICD-10-CM | POA: Diagnosis not present

## 2012-06-04 DIAGNOSIS — Z23 Encounter for immunization: Secondary | ICD-10-CM | POA: Diagnosis not present

## 2012-06-04 DIAGNOSIS — E119 Type 2 diabetes mellitus without complications: Secondary | ICD-10-CM | POA: Diagnosis not present

## 2012-06-28 DIAGNOSIS — Z5189 Encounter for other specified aftercare: Secondary | ICD-10-CM | POA: Diagnosis not present

## 2012-06-28 DIAGNOSIS — Z85820 Personal history of malignant melanoma of skin: Secondary | ICD-10-CM | POA: Diagnosis not present

## 2012-06-28 DIAGNOSIS — M6281 Muscle weakness (generalized): Secondary | ICD-10-CM | POA: Diagnosis not present

## 2012-06-28 DIAGNOSIS — I69998 Other sequelae following unspecified cerebrovascular disease: Secondary | ICD-10-CM | POA: Diagnosis not present

## 2012-06-28 DIAGNOSIS — I10 Essential (primary) hypertension: Secondary | ICD-10-CM | POA: Diagnosis not present

## 2012-06-28 DIAGNOSIS — E119 Type 2 diabetes mellitus without complications: Secondary | ICD-10-CM | POA: Diagnosis not present

## 2012-06-28 DIAGNOSIS — I69959 Hemiplegia and hemiparesis following unspecified cerebrovascular disease affecting unspecified side: Secondary | ICD-10-CM | POA: Diagnosis not present

## 2012-07-03 DIAGNOSIS — I10 Essential (primary) hypertension: Secondary | ICD-10-CM | POA: Diagnosis not present

## 2012-07-03 DIAGNOSIS — E119 Type 2 diabetes mellitus without complications: Secondary | ICD-10-CM | POA: Diagnosis not present

## 2012-07-03 DIAGNOSIS — Z5189 Encounter for other specified aftercare: Secondary | ICD-10-CM | POA: Diagnosis not present

## 2012-07-03 DIAGNOSIS — I69959 Hemiplegia and hemiparesis following unspecified cerebrovascular disease affecting unspecified side: Secondary | ICD-10-CM | POA: Diagnosis not present

## 2012-07-03 DIAGNOSIS — I69998 Other sequelae following unspecified cerebrovascular disease: Secondary | ICD-10-CM | POA: Diagnosis not present

## 2012-07-03 DIAGNOSIS — M6281 Muscle weakness (generalized): Secondary | ICD-10-CM | POA: Diagnosis not present

## 2012-07-05 DIAGNOSIS — M6281 Muscle weakness (generalized): Secondary | ICD-10-CM | POA: Diagnosis not present

## 2012-07-05 DIAGNOSIS — I69998 Other sequelae following unspecified cerebrovascular disease: Secondary | ICD-10-CM | POA: Diagnosis not present

## 2012-07-05 DIAGNOSIS — I69959 Hemiplegia and hemiparesis following unspecified cerebrovascular disease affecting unspecified side: Secondary | ICD-10-CM | POA: Diagnosis not present

## 2012-07-05 DIAGNOSIS — Z5189 Encounter for other specified aftercare: Secondary | ICD-10-CM | POA: Diagnosis not present

## 2012-07-05 DIAGNOSIS — E119 Type 2 diabetes mellitus without complications: Secondary | ICD-10-CM | POA: Diagnosis not present

## 2012-07-05 DIAGNOSIS — I10 Essential (primary) hypertension: Secondary | ICD-10-CM | POA: Diagnosis not present

## 2012-07-06 DIAGNOSIS — I10 Essential (primary) hypertension: Secondary | ICD-10-CM | POA: Diagnosis not present

## 2012-07-06 DIAGNOSIS — M6281 Muscle weakness (generalized): Secondary | ICD-10-CM | POA: Diagnosis not present

## 2012-07-06 DIAGNOSIS — I69998 Other sequelae following unspecified cerebrovascular disease: Secondary | ICD-10-CM | POA: Diagnosis not present

## 2012-07-06 DIAGNOSIS — E119 Type 2 diabetes mellitus without complications: Secondary | ICD-10-CM | POA: Diagnosis not present

## 2012-07-06 DIAGNOSIS — I69959 Hemiplegia and hemiparesis following unspecified cerebrovascular disease affecting unspecified side: Secondary | ICD-10-CM | POA: Diagnosis not present

## 2012-07-06 DIAGNOSIS — Z5189 Encounter for other specified aftercare: Secondary | ICD-10-CM | POA: Diagnosis not present

## 2012-07-11 DIAGNOSIS — I10 Essential (primary) hypertension: Secondary | ICD-10-CM | POA: Diagnosis not present

## 2012-07-11 DIAGNOSIS — Z5189 Encounter for other specified aftercare: Secondary | ICD-10-CM | POA: Diagnosis not present

## 2012-07-11 DIAGNOSIS — I69998 Other sequelae following unspecified cerebrovascular disease: Secondary | ICD-10-CM | POA: Diagnosis not present

## 2012-07-11 DIAGNOSIS — M6281 Muscle weakness (generalized): Secondary | ICD-10-CM | POA: Diagnosis not present

## 2012-07-11 DIAGNOSIS — I69959 Hemiplegia and hemiparesis following unspecified cerebrovascular disease affecting unspecified side: Secondary | ICD-10-CM | POA: Diagnosis not present

## 2012-07-11 DIAGNOSIS — E119 Type 2 diabetes mellitus without complications: Secondary | ICD-10-CM | POA: Diagnosis not present

## 2012-07-15 DIAGNOSIS — I69959 Hemiplegia and hemiparesis following unspecified cerebrovascular disease affecting unspecified side: Secondary | ICD-10-CM | POA: Diagnosis not present

## 2012-07-15 DIAGNOSIS — M6281 Muscle weakness (generalized): Secondary | ICD-10-CM | POA: Diagnosis not present

## 2012-07-15 DIAGNOSIS — Z5189 Encounter for other specified aftercare: Secondary | ICD-10-CM | POA: Diagnosis not present

## 2012-07-15 DIAGNOSIS — I10 Essential (primary) hypertension: Secondary | ICD-10-CM | POA: Diagnosis not present

## 2012-07-15 DIAGNOSIS — I69998 Other sequelae following unspecified cerebrovascular disease: Secondary | ICD-10-CM | POA: Diagnosis not present

## 2012-07-15 DIAGNOSIS — E119 Type 2 diabetes mellitus without complications: Secondary | ICD-10-CM | POA: Diagnosis not present

## 2012-07-23 DIAGNOSIS — M6281 Muscle weakness (generalized): Secondary | ICD-10-CM | POA: Diagnosis not present

## 2012-07-23 DIAGNOSIS — E119 Type 2 diabetes mellitus without complications: Secondary | ICD-10-CM | POA: Diagnosis not present

## 2012-07-23 DIAGNOSIS — I10 Essential (primary) hypertension: Secondary | ICD-10-CM | POA: Diagnosis not present

## 2012-07-23 DIAGNOSIS — I69998 Other sequelae following unspecified cerebrovascular disease: Secondary | ICD-10-CM | POA: Diagnosis not present

## 2012-07-23 DIAGNOSIS — Z5189 Encounter for other specified aftercare: Secondary | ICD-10-CM | POA: Diagnosis not present

## 2012-07-23 DIAGNOSIS — I69959 Hemiplegia and hemiparesis following unspecified cerebrovascular disease affecting unspecified side: Secondary | ICD-10-CM | POA: Diagnosis not present

## 2012-08-02 DIAGNOSIS — I69998 Other sequelae following unspecified cerebrovascular disease: Secondary | ICD-10-CM | POA: Diagnosis not present

## 2012-08-02 DIAGNOSIS — I69959 Hemiplegia and hemiparesis following unspecified cerebrovascular disease affecting unspecified side: Secondary | ICD-10-CM | POA: Diagnosis not present

## 2012-08-02 DIAGNOSIS — I10 Essential (primary) hypertension: Secondary | ICD-10-CM | POA: Diagnosis not present

## 2012-08-02 DIAGNOSIS — E119 Type 2 diabetes mellitus without complications: Secondary | ICD-10-CM | POA: Diagnosis not present

## 2012-08-02 DIAGNOSIS — Z5189 Encounter for other specified aftercare: Secondary | ICD-10-CM | POA: Diagnosis not present

## 2012-08-02 DIAGNOSIS — M6281 Muscle weakness (generalized): Secondary | ICD-10-CM | POA: Diagnosis not present

## 2012-08-08 DIAGNOSIS — I10 Essential (primary) hypertension: Secondary | ICD-10-CM | POA: Diagnosis not present

## 2012-08-08 DIAGNOSIS — I69998 Other sequelae following unspecified cerebrovascular disease: Secondary | ICD-10-CM | POA: Diagnosis not present

## 2012-08-08 DIAGNOSIS — Z5189 Encounter for other specified aftercare: Secondary | ICD-10-CM | POA: Diagnosis not present

## 2012-08-08 DIAGNOSIS — M6281 Muscle weakness (generalized): Secondary | ICD-10-CM | POA: Diagnosis not present

## 2012-08-08 DIAGNOSIS — E119 Type 2 diabetes mellitus without complications: Secondary | ICD-10-CM | POA: Diagnosis not present

## 2012-08-08 DIAGNOSIS — I69959 Hemiplegia and hemiparesis following unspecified cerebrovascular disease affecting unspecified side: Secondary | ICD-10-CM | POA: Diagnosis not present

## 2012-08-09 DIAGNOSIS — Z5189 Encounter for other specified aftercare: Secondary | ICD-10-CM | POA: Diagnosis not present

## 2012-08-09 DIAGNOSIS — E119 Type 2 diabetes mellitus without complications: Secondary | ICD-10-CM | POA: Diagnosis not present

## 2012-08-09 DIAGNOSIS — I69998 Other sequelae following unspecified cerebrovascular disease: Secondary | ICD-10-CM | POA: Diagnosis not present

## 2012-08-09 DIAGNOSIS — I69959 Hemiplegia and hemiparesis following unspecified cerebrovascular disease affecting unspecified side: Secondary | ICD-10-CM | POA: Diagnosis not present

## 2012-08-09 DIAGNOSIS — M6281 Muscle weakness (generalized): Secondary | ICD-10-CM | POA: Diagnosis not present

## 2012-08-09 DIAGNOSIS — I10 Essential (primary) hypertension: Secondary | ICD-10-CM | POA: Diagnosis not present

## 2012-08-21 DIAGNOSIS — I10 Essential (primary) hypertension: Secondary | ICD-10-CM | POA: Diagnosis not present

## 2012-08-21 DIAGNOSIS — Z5189 Encounter for other specified aftercare: Secondary | ICD-10-CM | POA: Diagnosis not present

## 2012-08-21 DIAGNOSIS — I69998 Other sequelae following unspecified cerebrovascular disease: Secondary | ICD-10-CM | POA: Diagnosis not present

## 2012-08-21 DIAGNOSIS — E119 Type 2 diabetes mellitus without complications: Secondary | ICD-10-CM | POA: Diagnosis not present

## 2012-08-21 DIAGNOSIS — M6281 Muscle weakness (generalized): Secondary | ICD-10-CM | POA: Diagnosis not present

## 2012-08-21 DIAGNOSIS — I69959 Hemiplegia and hemiparesis following unspecified cerebrovascular disease affecting unspecified side: Secondary | ICD-10-CM | POA: Diagnosis not present

## 2012-11-21 ENCOUNTER — Other Ambulatory Visit: Payer: Self-pay | Admitting: Family Medicine

## 2012-11-21 ENCOUNTER — Telehealth: Payer: Self-pay | Admitting: Physician Assistant

## 2012-11-21 MED ORDER — LISINOPRIL 20 MG PO TABS: 20.0000 mg | ORAL_TABLET | Freq: Two times a day (BID) | ORAL | Status: DC

## 2012-11-21 NOTE — Telephone Encounter (Signed)
Pt pharmacy entered into system and finally sent to pharmacy for refill

## 2012-11-21 NOTE — Telephone Encounter (Signed)
Lisinopril 20 mg take one tablet twice daily #60 last refill 10/31/2012

## 2012-11-21 NOTE — Telephone Encounter (Signed)
Medication refilled per protocol. 

## 2012-11-30 ENCOUNTER — Other Ambulatory Visit: Payer: Self-pay | Admitting: Physician Assistant

## 2012-12-04 NOTE — Telephone Encounter (Signed)
Medication refilled per protocol. 

## 2012-12-10 ENCOUNTER — Telehealth: Payer: Self-pay | Admitting: Family Medicine

## 2012-12-10 ENCOUNTER — Other Ambulatory Visit: Payer: Self-pay | Admitting: Family Medicine

## 2012-12-10 MED ORDER — SIMVASTATIN 40 MG PO TABS: 40.0000 mg | ORAL_TABLET | Freq: Every day | ORAL | Status: DC

## 2012-12-10 NOTE — Telephone Encounter (Signed)
Medication refilled per protocol. 

## 2013-01-03 ENCOUNTER — Other Ambulatory Visit: Payer: Self-pay | Admitting: Physician Assistant

## 2013-02-04 ENCOUNTER — Other Ambulatory Visit: Payer: Self-pay | Admitting: Physician Assistant

## 2013-02-04 DIAGNOSIS — L608 Other nail disorders: Secondary | ICD-10-CM | POA: Diagnosis not present

## 2013-02-04 DIAGNOSIS — I872 Venous insufficiency (chronic) (peripheral): Secondary | ICD-10-CM | POA: Diagnosis not present

## 2013-02-04 DIAGNOSIS — L97309 Non-pressure chronic ulcer of unspecified ankle with unspecified severity: Secondary | ICD-10-CM | POA: Diagnosis not present

## 2013-02-05 ENCOUNTER — Encounter: Payer: Self-pay | Admitting: Family Medicine

## 2013-02-05 NOTE — Telephone Encounter (Signed)
Need chart

## 2013-02-05 NOTE — Telephone Encounter (Signed)
need ov been 32mo,left message to return call/ss

## 2013-02-05 NOTE — Telephone Encounter (Signed)
Medication refill for one time only.  Patient needs to be seen.  Letter sent for patient to call and schedule 

## 2013-02-08 DIAGNOSIS — E119 Type 2 diabetes mellitus without complications: Secondary | ICD-10-CM | POA: Diagnosis not present

## 2013-02-08 DIAGNOSIS — L97809 Non-pressure chronic ulcer of other part of unspecified lower leg with unspecified severity: Secondary | ICD-10-CM | POA: Diagnosis not present

## 2013-02-14 DIAGNOSIS — L97809 Non-pressure chronic ulcer of other part of unspecified lower leg with unspecified severity: Secondary | ICD-10-CM | POA: Diagnosis not present

## 2013-02-14 DIAGNOSIS — E119 Type 2 diabetes mellitus without complications: Secondary | ICD-10-CM | POA: Diagnosis not present

## 2013-02-20 ENCOUNTER — Encounter: Payer: Self-pay | Admitting: Family Medicine

## 2013-02-20 ENCOUNTER — Ambulatory Visit (INDEPENDENT_AMBULATORY_CARE_PROVIDER_SITE_OTHER): Payer: Medicare Other | Admitting: Family Medicine

## 2013-02-20 ENCOUNTER — Ambulatory Visit: Payer: Medicare Other | Admitting: Family Medicine

## 2013-02-20 VITALS — BP 110/78 | HR 78 | Temp 98.1°F | Resp 22

## 2013-02-20 DIAGNOSIS — I872 Venous insufficiency (chronic) (peripheral): Secondary | ICD-10-CM

## 2013-02-20 DIAGNOSIS — I10 Essential (primary) hypertension: Secondary | ICD-10-CM | POA: Diagnosis not present

## 2013-02-20 DIAGNOSIS — E119 Type 2 diabetes mellitus without complications: Secondary | ICD-10-CM

## 2013-02-20 DIAGNOSIS — E785 Hyperlipidemia, unspecified: Secondary | ICD-10-CM

## 2013-02-20 LAB — COMPLETE METABOLIC PANEL WITH GFR
ALT: 44 U/L (ref 0–53)
AST: 25 U/L (ref 0–37)
Albumin: 4.1 g/dL (ref 3.5–5.2)
Alkaline Phosphatase: 85 U/L (ref 39–117)
BUN: 16 mg/dL (ref 6–23)
CO2: 26 mEq/L (ref 19–32)
Calcium: 9.4 mg/dL (ref 8.4–10.5)
Chloride: 107 mEq/L (ref 96–112)
Creat: 1.55 mg/dL — ABNORMAL HIGH (ref 0.50–1.35)
GFR, Est African American: 58 mL/min — ABNORMAL LOW
GFR, Est Non African American: 50 mL/min — ABNORMAL LOW
Glucose, Bld: 122 mg/dL — ABNORMAL HIGH (ref 70–99)
Potassium: 4.4 mEq/L (ref 3.5–5.3)
Sodium: 146 mEq/L — ABNORMAL HIGH (ref 135–145)
Total Bilirubin: 0.4 mg/dL (ref 0.3–1.2)
Total Protein: 7 g/dL (ref 6.0–8.3)

## 2013-02-20 LAB — LIPID PANEL
Cholesterol: 149 mg/dL (ref 0–200)
HDL: 26 mg/dL — ABNORMAL LOW (ref >39)
LDL Cholesterol: 73 mg/dL (ref 0–99)
Total CHOL/HDL Ratio: 5.7 Ratio
Triglycerides: 252 mg/dL — ABNORMAL HIGH (ref <150)
VLDL: 50 mg/dL — ABNORMAL HIGH (ref 0–40)

## 2013-02-20 LAB — HEMOGLOBIN A1C
Hgb A1c MFr Bld: 6.5 % — ABNORMAL HIGH (ref <5.7)
Mean Plasma Glucose: 140 mg/dL — ABNORMAL HIGH (ref <117)

## 2013-02-20 NOTE — Progress Notes (Signed)
Subjective:    Patient ID: Eduardo Young, male    DOB: 05/05/1956, 57 y.o.   MRN: [id number]  HPI  Patient is here for followup of his hypertension. He is currently taking Norvasc 10 mg by mouth daily, Cardura 2 mg by mouth daily, labetalol 300 mg, lisinopril 20 mg by mouth daily.  Blood pressure is well controlled. He denies any chest pain, shortness of breath, dyspnea on exertion. He does have +1 pitting edema which is made worse by the amlodipine in both legs. This leads to chronic venous stasis ulcers and bullae.  He has been wearing an Haematologist on his right leg which has significantly helped the edema and allowed to be ulcers to heal. There are chronic venous stasis changes on the dorsum of the right shin which is worse than the left shin. The edema tends to be worse in his right arm and right leg due to his history of CVA and right hemiparesis. He also has hyperlipidemia for which he takes simvastatin 40 mg by mouth daily. There are no myalgias or right upper quadrant pain. He also has insulin-dependent diabetes mellitus.  He is currently on Lantus 12 units twice a day and metformin. Past Medical History  Diagnosis Date  . Diabetes mellitus   . Hyperlipidemia   . Hypertension   . Gout   . Sleep apnea, obstructive   . Stroke     [id number]  . GERD (gastroesophageal reflux disease)   . Obesity   . Renal calculi   . Chronic venous insufficiency    Current Outpatient Prescriptions on File Prior to Visit  Medication Sig Dispense Refill  . allopurinol (ZYLOPRIM) 100 MG tablet TAKE ONE TABLET BY MOUTH TWICE DAILY  60 tablet  2  . amLODipine (NORVASC) 10 MG tablet TAKE ONE TABLET BY MOUTH EVERY DAY  30 tablet  2  . aspirin 81 MG tablet Take 81 mg by mouth daily.      Marland Kitchen doxazosin (CARDURA) 2 MG tablet Take 2 mg by mouth at bedtime.        . insulin glargine (LANTUS) 100 UNIT/ML injection Inject 12 Units into the skin 2 (two) times daily.       Marland Kitchen labetalol (NORMODYNE) 300 MG tablet TAKE ONE  TABLET BY MOUTH TWICE DAILY  60 tablet  0  . lisinopril (PRINIVIL,ZESTRIL) 20 MG tablet Take 1 tablet (20 mg total) by mouth 2 (two) times daily.  60 tablet  2  . metFORMIN (GLUCOPHAGE) 500 MG tablet TAKE ONE TABLET BY MOUTH TWICE DAILY  60 tablet  2  . simvastatin (ZOCOR) 40 MG tablet TAKE ONE TABLET BY MOUTH AT BEDTIME  30 tablet  2  . mometasone (ELOCON) 0.1 % ointment Apply topically daily.       No current facility-administered medications on file prior to visit.   No Known Allergies History   Social History  . Marital Status: Single    Spouse Name: N/A    Number of Children: N/A  . Years of Education: N/A   Occupational History  . Not on file.   Social History Main Topics  . Smoking status: Former Research scientist (life sciences)  . Smokeless tobacco: Not on file  . Alcohol Use: Yes     Comment: Occasional  . Drug Use: Yes    Special: Cocaine, Marijuana     Comment: Past Hx  . Sexually Active: Not on file   Other Topics Concern  . Not on file   Social History Narrative  .  No narrative on file   No family history on file.   Review of Systems  All other systems reviewed and are negative.       Objective:   Physical Exam  Vitals reviewed. Constitutional: He is oriented to person, place, and time. He appears well-developed and well-nourished.  Cardiovascular: Normal rate, regular rhythm, normal heart sounds and intact distal pulses.   No murmur heard. Pulmonary/Chest: Effort normal and breath sounds normal. No respiratory distress. He has no wheezes. He has no rales. He exhibits no tenderness.  Abdominal: Soft. Bowel sounds are normal. He exhibits no distension and no mass. There is no tenderness. There is no rebound and no guarding.  Musculoskeletal: He exhibits edema.  Neurological: He is alert and oriented to person, place, and time. No cranial nerve deficit. He exhibits abnormal muscle tone.  Skin: Rash noted.   patient has right hemiparesis and is unable to use his right arm or  right leg. He has dependent edema in the right arm and the right leg. He has +1 pitting edema in the right leg with chronic venous stasis changes on the right shin. There are no active venous ulcers or bullae.  Diabetic foot exam is performed. He is unable to feel his right foot due to his stroke.        Assessment & Plan:  1. Type II or unspecified type diabetes mellitus without mention of complication, not stated as uncontrolled Check hemoglobin A1c. The goal A1c is less than 6.5.   - COMPLETE METABOLIC PANEL WITH GFR - Hemoglobin A1c  2. HTN (hypertension) Blood pressure is currently well controlled. Continue current medications at the present dosages. - COMPLETE METABOLIC PANEL WITH GFR  3. HLD (hyperlipidemia) Check fasting lipid panel. Goal LDL is less than 70 due to his history of stroke he - COMPLETE METABOLIC PANEL WITH GFR - Lipid panel  4. Chronic venous insufficiency Rather than put the patient back in Unna boot, I recommended wearing compression stockings that are knee-high prescription for 20-30 mm of mercury  Bilaterally.  He is these every day. His venous stasis ulcers develop we can then use and inability as needed. Can also consider referral to a vascular surgeon. We can also consider discontinuing amlodipine and adding a diuretic. Patient is hesitant to add a diuretic due to his hemiparesis and difficulty going to the restroom.

## 2013-02-25 ENCOUNTER — Encounter: Payer: Self-pay | Admitting: Family Medicine

## 2013-02-26 ENCOUNTER — Telehealth: Payer: Self-pay | Admitting: Family Medicine

## 2013-02-27 NOTE — Telephone Encounter (Signed)
Manuela Schwartz called back and I gave her the results and told her that we sent a letter on 02/25/13.

## 2013-03-08 ENCOUNTER — Other Ambulatory Visit: Payer: Self-pay | Admitting: Physician Assistant

## 2013-03-24 ENCOUNTER — Encounter (HOSPITAL_COMMUNITY): Payer: Self-pay | Admitting: Emergency Medicine

## 2013-03-24 ENCOUNTER — Emergency Department (HOSPITAL_COMMUNITY): Payer: Medicare Other

## 2013-03-24 ENCOUNTER — Emergency Department (HOSPITAL_COMMUNITY)
Admission: EM | Admit: 2013-03-24 | Discharge: 2013-03-25 | Disposition: A | Discharge status: Home / Self Care | Payer: Medicare Other | Attending: Emergency Medicine | Admitting: Emergency Medicine

## 2013-03-24 ENCOUNTER — Emergency Department (INDEPENDENT_AMBULATORY_CARE_PROVIDER_SITE_OTHER)
Admission: EM | Admit: 2013-03-24 | Discharge: 2013-03-24 | Disposition: A | Discharge status: Home / Self Care | Payer: Medicare Other | Source: Home / Self Care | Attending: Emergency Medicine | Admitting: Emergency Medicine

## 2013-03-24 ENCOUNTER — Emergency Department (INDEPENDENT_AMBULATORY_CARE_PROVIDER_SITE_OTHER): Payer: Medicare Other

## 2013-03-24 DIAGNOSIS — J189 Pneumonia, unspecified organism: Secondary | ICD-10-CM

## 2013-03-24 DIAGNOSIS — Z87891 Personal history of nicotine dependence: Secondary | ICD-10-CM | POA: Insufficient documentation

## 2013-03-24 DIAGNOSIS — E669 Obesity, unspecified: Secondary | ICD-10-CM | POA: Insufficient documentation

## 2013-03-24 DIAGNOSIS — R0902 Hypoxemia: Secondary | ICD-10-CM

## 2013-03-24 DIAGNOSIS — Z8669 Personal history of other diseases of the nervous system and sense organs: Secondary | ICD-10-CM | POA: Diagnosis not present

## 2013-03-24 DIAGNOSIS — R0602 Shortness of breath: Secondary | ICD-10-CM | POA: Diagnosis not present

## 2013-03-24 DIAGNOSIS — Z79899 Other long term (current) drug therapy: Secondary | ICD-10-CM | POA: Diagnosis not present

## 2013-03-24 DIAGNOSIS — E785 Hyperlipidemia, unspecified: Secondary | ICD-10-CM | POA: Diagnosis not present

## 2013-03-24 DIAGNOSIS — Z794 Long term (current) use of insulin: Secondary | ICD-10-CM | POA: Insufficient documentation

## 2013-03-24 DIAGNOSIS — E119 Type 2 diabetes mellitus without complications: Secondary | ICD-10-CM | POA: Diagnosis not present

## 2013-03-24 DIAGNOSIS — M109 Gout, unspecified: Secondary | ICD-10-CM | POA: Insufficient documentation

## 2013-03-24 DIAGNOSIS — H109 Unspecified conjunctivitis: Secondary | ICD-10-CM | POA: Diagnosis not present

## 2013-03-24 DIAGNOSIS — Z8673 Personal history of transient ischemic attack (TIA), and cerebral infarction without residual deficits: Secondary | ICD-10-CM | POA: Diagnosis not present

## 2013-03-24 DIAGNOSIS — Z87442 Personal history of urinary calculi: Secondary | ICD-10-CM | POA: Insufficient documentation

## 2013-03-24 DIAGNOSIS — J159 Unspecified bacterial pneumonia: Secondary | ICD-10-CM | POA: Diagnosis not present

## 2013-03-24 DIAGNOSIS — H1030 Unspecified acute conjunctivitis, unspecified eye: Secondary | ICD-10-CM | POA: Diagnosis not present

## 2013-03-24 DIAGNOSIS — R141 Gas pain: Secondary | ICD-10-CM | POA: Diagnosis not present

## 2013-03-24 DIAGNOSIS — K5900 Constipation, unspecified: Secondary | ICD-10-CM | POA: Diagnosis not present

## 2013-03-24 DIAGNOSIS — Z7982 Long term (current) use of aspirin: Secondary | ICD-10-CM | POA: Insufficient documentation

## 2013-03-24 DIAGNOSIS — J398 Other specified diseases of upper respiratory tract: Secondary | ICD-10-CM | POA: Diagnosis not present

## 2013-03-24 DIAGNOSIS — I10 Essential (primary) hypertension: Secondary | ICD-10-CM | POA: Diagnosis not present

## 2013-03-24 LAB — CBC WITH DIFFERENTIAL/PLATELET
Basophils Absolute: 0 10*3/uL (ref 0.0–0.1)
Basophils Relative: 0 % (ref 0–1)
Eosinophils Absolute: 0.2 10*3/uL (ref 0.0–0.7)
Eosinophils Relative: 2 % (ref 0–5)
HCT: 37.3 % — ABNORMAL LOW (ref 39.0–52.0)
Hemoglobin: 12.9 g/dL — ABNORMAL LOW (ref 13.0–17.0)
Lymphocytes Relative: 15 % (ref 12–46)
Lymphs Abs: 1.6 10*3/uL (ref 0.7–4.0)
MCH: 29.3 pg (ref 26.0–34.0)
MCHC: 34.6 g/dL (ref 30.0–36.0)
MCV: 84.8 fL (ref 78.0–100.0)
Monocytes Absolute: 1.4 10*3/uL — ABNORMAL HIGH (ref 0.1–1.0)
Monocytes Relative: 13 % — ABNORMAL HIGH (ref 3–12)
Neutro Abs: 7.7 10*3/uL (ref 1.7–7.7)
Neutrophils Relative %: 71 % (ref 43–77)
Platelets: 233 10*3/uL (ref 150–400)
RBC: 4.4 MIL/uL (ref 4.22–5.81)
RDW: 15.2 % (ref 11.5–15.5)
WBC: 10.9 10*3/uL — ABNORMAL HIGH (ref 4.0–10.5)

## 2013-03-24 LAB — POCT I-STAT, CHEM 8
BUN: 22 mg/dL (ref 6–23)
Calcium, Ion: 1.03 mmol/L — ABNORMAL LOW (ref 1.12–1.23)
Chloride: 105 mEq/L (ref 96–112)
Creatinine, Ser: 1.8 mg/dL — ABNORMAL HIGH (ref 0.50–1.35)
Glucose, Bld: 134 mg/dL — ABNORMAL HIGH (ref 70–99)
HCT: 39 % (ref 39.0–52.0)
Hemoglobin: 13.3 g/dL (ref 13.0–17.0)
Potassium: 3.8 mEq/L (ref 3.5–5.1)
Sodium: 140 mEq/L (ref 135–145)
TCO2: 25 mmol/L (ref 0–100)

## 2013-03-24 LAB — PRO B NATRIURETIC PEPTIDE: Pro B Natriuretic peptide (BNP): 1278 pg/mL — ABNORMAL HIGH (ref 0–125)

## 2013-03-24 MED ORDER — MINERAL OIL RE ENEM
1.0000 | ENEMA | Freq: Once | RECTAL | Status: AC
  Administered 2013-03-24: 1 via RECTAL
  Filled 2013-03-24: qty 1

## 2013-03-24 MED ORDER — ERYTHROMYCIN 5 MG/GM OP OINT
TOPICAL_OINTMENT | Freq: Once | OPHTHALMIC | Status: AC
  Administered 2013-03-24: 23:00:00 via OPHTHALMIC
  Filled 2013-03-24: qty 1

## 2013-03-24 MED ORDER — SODIUM CHLORIDE 0.9 % IV BOLUS (SEPSIS)
1000.0000 mL | Freq: Once | INTRAVENOUS | Status: AC
  Administered 2013-03-24: 1000 mL via INTRAVENOUS

## 2013-03-24 NOTE — ED Notes (Signed)
Pt has hx of CVA with minimal movement to right leg and no movement to right arm. Pt has minimal ambulation at home.

## 2013-03-24 NOTE — ED Notes (Signed)
[facility] does not have a truck.  [facility] called

## 2013-03-24 NOTE — ED Provider Notes (Signed)
Medical screening examination/treatment/procedure(s) were performed by non-physician practitioner and as supervising physician I was immediately available for consultation/collaboration.  Philipp Deputy, M.D.  Harden Mo, MD 03/24/13 2149

## 2013-03-24 NOTE — ED Notes (Addendum)
Patient has multiple complaints.  Patient reports flu-like symptoms:  Achy, cough, sore throat, eys with drainage, fever ( 100.9)  Patient also reports no bm for 6 days.  No abdominal pain, does have nausea, no appetite, took 2 dulcolax tabs last night and 1 today--no results.  History of constipation, but never this bad.

## 2013-03-24 NOTE — ED Provider Notes (Addendum)
CSN: [id number]     Arrival date & time 03/24/13  2141 History   First MD Initiated Contact with Patient 03/24/13 2155     Chief Complaint  Patient presents with  . Pneumonia   (Consider location/radiation/quality/duration/timing/severity/associated sxs/prior Treatment) HPI Comments: For the past 5 days has had non productive cough for the past 3 days has had chills, appetite change- eats at home rather than out because he did not feels well, but no decrease in amount or frequency consumed.  R eye discharge for the past 24 hours, and no BM for 5 days   Normal bowel pattern daily has tried Dulcolax tabs without results.  Denies abdominal pain X ray at [facility] shows stool in colon without obstruction  Reviewed labs drawn at [facility] there is a slight increase in BUN and Creatinine from 16-22 and 1.55- 1.8 which is insignificant   Patient is a 57 y.o. male presenting with pneumonia. The history is provided by the patient.  Pneumonia This is a new problem. The problem has been unchanged. Associated symptoms include chills and coughing. Pertinent negatives include no abdominal pain, fever or nausea. Nothing aggravates the symptoms. He has tried nothing for the symptoms.    Past Medical History  Diagnosis Date  . Diabetes mellitus   . Hyperlipidemia   . Hypertension   . Gout   . Sleep apnea, obstructive   . Stroke     [id number]  . GERD (gastroesophageal reflux disease)   . Obesity   . Renal calculi   . Chronic venous insufficiency    History reviewed. No pertinent past surgical history. No family history on file. History  Substance Use Topics  . Smoking status: Former Research scientist (life sciences)  . Smokeless tobacco: Not on file  . Alcohol Use: Yes     Comment: Occasional    Review of Systems  Constitutional: Positive for chills. Negative for fever.  Respiratory: Positive for cough.   Gastrointestinal: Negative for nausea and abdominal pain.    Allergies  Review of patient's allergies indicates no known  allergies.  Home Medications   Current Outpatient Rx  Name  Route  Sig  Dispense  Refill  . allopurinol (ZYLOPRIM) 100 MG tablet   Oral   Take 100 mg by mouth 2 (two) times daily.         Marland Kitchen amLODipine (NORVASC) 10 MG tablet   Oral   Take 10 mg by mouth daily.         Marland Kitchen aspirin 81 MG tablet   Oral   Take 81 mg by mouth every evening.          . bisacodyl (DULCOLAX) 5 MG EC tablet   Oral   Take 5 mg by mouth daily as needed for constipation.         Marland Kitchen doxazosin (CARDURA) 2 MG tablet   Oral   Take 2 mg by mouth every morning.          . insulin glargine (LANTUS) 100 UNIT/ML injection   Subcutaneous   Inject 12 Units into the skin 2 (two) times daily.          Marland Kitchen labetalol (NORMODYNE) 300 MG tablet   Oral   Take 300 mg by mouth 2 (two) times daily.         Marland Kitchen lisinopril (PRINIVIL,ZESTRIL) 20 MG tablet   Oral   Take 1 tablet (20 mg total) by mouth 2 (two) times daily.   60 tablet   2   . metFORMIN (  GLUCOPHAGE) 500 MG tablet   Oral   Take 500 mg by mouth 2 (two) times daily with a meal.         . Multiple Vitamin (MULTIVITAMIN WITH MINERALS) TABS tablet   Oral   Take 1 tablet by mouth daily.         . Pseudoeph-Doxylamine-DM-APAP (DAYQUIL/NYQUIL COLD/FLU RELIEF PO)   Oral   Take 2 capsules by mouth 2 (two) times daily as needed (for cold).         . simvastatin (ZOCOR) 40 MG tablet   Oral   Take 40 mg by mouth every evening.         Marland Kitchen azithromycin (ZITHROMAX) 250 MG tablet   Oral   Take 1 tablet (250 mg total) by mouth daily.   4 tablet   0   . levofloxacin (LEVAQUIN) 500 MG tablet   Oral   Take 1 tablet (500 mg total) by mouth daily. X 7 days   7 tablet   0   . polyethylene glycol powder (GLYCOLAX/MIRALAX) powder   Oral   Take 17 g by mouth daily.   255 g   0    BP 144/60  Pulse 91  Temp(Src) 99.7 F (37.6 C) (Oral)  Resp 18  SpO2 93% Physical Exam  Nursing note and vitals reviewed. Constitutional: He is oriented to  person, place, and time. He appears well-developed and well-nourished.  HENT:  Head: Normocephalic.  Mouth/Throat: Oropharynx is clear and moist.  Eyes: Pupils are equal, round, and reactive to light. Right eye exhibits discharge.  Neck: Normal range of motion.  Cardiovascular: Normal rate.   Pulmonary/Chest: Effort normal. No respiratory distress. He has no wheezes. He exhibits no tenderness.  Abdominal: Soft. Bowel sounds are normal. He exhibits distension.  Musculoskeletal: Normal range of motion.  Neurological: He is alert and oriented to person, place, and time.  Skin: Skin is warm.    ED Course  Procedures (including critical care time) Labs Review Labs Reviewed - No data to display Imaging Review No results found.  MDM   1. CAP (community acquired pneumonia)   2. Conjunctivitis   3. Constipation     Will obtain AP/Lat chest xray rather than portable for definitive Dx of pneumonia  Will treat conjunctivitis and give patient fleets enema.   Moderate results from enema  Vital signs with normal range  Patient does not want to stay in hospital.  He's been informed of worsening symptoms to prompt a return for further evaluation and possible admission.  Garald Balding, NP 03/25/13 0217  Garald Balding, NP 03/31/13 0054  Garald Balding, NP 03/31/13 0054  Garald Balding, NP 04/14/13 1955

## 2013-03-24 NOTE — ED Notes (Signed)
Per EMS - patient from urgent care. Xray done at urgent care and patient diagnosed with pneumonia.

## 2013-03-24 NOTE — ED Provider Notes (Signed)
CSN: [id number]     Arrival date & time 03/24/13  1900 History   First MD Initiated Contact with Patient 03/24/13 1930     Chief Complaint  Patient presents with  . URI  . Constipation   (Consider location/radiation/quality/duration/timing/severity/associated sxs/prior Treatment) HPI Comments: 57 year old male with a history of hypertension, diabetes, stroke with residual right-sided weakness presents complaining of cough, sore throat, fever, chills, body aches, eye drainage, and constipation. This began one week ago with cough. The rest of his symptoms have begun since then. He has mild subjective shortness of breath and pleuritic discomfort. All symptoms have been getting gradually worse throughout the week. He has taken over-the-counter medicines to try to help his symptoms but they have not helped at all. He has not had a bowel movement for 6 days either. This is very abnormal for him. His diet has changed in the last 6 days because he is taking at home instead of eating out due to feeling ill. No abdominal pain. He is taking Dulcolax yesterday and today but this has not helped.  Patient is a 57 y.o. male presenting with URI and constipation.  URI Presenting symptoms: cough and sore throat   Presenting symptoms: no fatigue and no fever   Associated symptoms: arthralgias and myalgias   Associated symptoms: no neck pain   Constipation Associated symptoms: no abdominal pain, no diarrhea, no dysuria, no fever, no nausea and no vomiting     Past Medical History  Diagnosis Date  . Diabetes mellitus   . Hyperlipidemia   . Hypertension   . Gout   . Sleep apnea, obstructive   . Stroke     [id number]  . GERD (gastroesophageal reflux disease)   . Obesity   . Renal calculi   . Chronic venous insufficiency    History reviewed. No pertinent past surgical history. History reviewed. No pertinent family history. History  Substance Use Topics  . Smoking status: Former Research scientist (life sciences)  . Smokeless  tobacco: Not on file  . Alcohol Use: Yes     Comment: Occasional    Review of Systems  Constitutional: Negative for fever, chills and fatigue.  HENT: Positive for sore throat. Negative for neck pain and neck stiffness.   Eyes: Positive for discharge and itching. Negative for pain, redness and visual disturbance.  Respiratory: Positive for cough, chest tightness and shortness of breath.   Cardiovascular: Negative for chest pain, palpitations and leg swelling.  Gastrointestinal: Positive for constipation. Negative for nausea, vomiting, abdominal pain and diarrhea.  Genitourinary: Negative for dysuria, urgency, frequency and hematuria.  Musculoskeletal: Positive for myalgias, arthralgias and gait problem (confined to wheelchair, stroke sequela).  Skin: Negative for rash.  Neurological: Positive for facial asymmetry (Chronic, stroke sequela) and weakness (chronic, stroke sequela). Negative for dizziness and light-headedness.    Allergies  Review of patient's allergies indicates no known allergies.  Home Medications   Current Outpatient Rx  Name  Route  Sig  Dispense  Refill  . allopurinol (ZYLOPRIM) 100 MG tablet      TAKE ONE TABLET BY MOUTH TWICE DAILY   60 tablet   2   . amLODipine (NORVASC) 10 MG tablet      TAKE ONE TABLET BY MOUTH EVERY DAY   30 tablet   2   . aspirin 81 MG tablet   Oral   Take 81 mg by mouth daily.         Marland Kitchen doxazosin (CARDURA) 2 MG tablet   Oral   Take  2 mg by mouth at bedtime.           . insulin glargine (LANTUS) 100 UNIT/ML injection   Subcutaneous   Inject 12 Units into the skin 2 (two) times daily.          Marland Kitchen labetalol (NORMODYNE) 300 MG tablet      TAKE ONE TABLET BY MOUTH TWICE DAILY **PATIENT NEEDS TO BE SEEN BEFORE ANY FURTHER REFILLS PER MD**   60 tablet   5     Please take out pt needs to be seen as i am unable ...   . labetalol (NORMODYNE) 300 MG tablet      TAKE ONE TABLET BY MOUTH TWICE DAILY   60 tablet   3   .  lisinopril (PRINIVIL,ZESTRIL) 20 MG tablet   Oral   Take 1 tablet (20 mg total) by mouth 2 (two) times daily.   60 tablet   2   . metFORMIN (GLUCOPHAGE) 500 MG tablet      TAKE ONE TABLET BY MOUTH TWICE DAILY   60 tablet   2   . mometasone (ELOCON) 0.1 % ointment   Topical   Apply topically daily.         . simvastatin (ZOCOR) 40 MG tablet      TAKE ONE TABLET BY MOUTH AT BEDTIME   30 tablet   2    BP 130/76  Pulse 55  Temp(Src) 100.9 F (38.3 C) (Oral)  Resp 18  SpO2 94% Physical Exam  Nursing note and vitals reviewed. Constitutional: He is oriented to person, place, and time. He appears well-developed and well-nourished. No distress.  Morbidly obese body habitus  HENT:  Head: Normocephalic and atraumatic.  Eyes: Conjunctivae are normal. Right eye exhibits discharge. Left eye exhibits discharge.  Cardiovascular: Normal rate, regular rhythm and normal heart sounds.  Exam reveals no gallop and no friction rub.   No murmur heard. Pulmonary/Chest: Effort normal. No respiratory distress. He has no wheezes. He has rales (left lower lobe).  Abdominal: Soft. He exhibits distension. He exhibits no mass. There is no tenderness. There is no rebound and no guarding.  Neurological: He is alert and oriented to person, place, and time. Coordination normal.  Skin: Skin is warm and dry. No rash noted. He is not diaphoretic.  Psychiatric: He has a normal mood and affect. Judgment normal.    ED Course  Procedures (including critical care time) Labs Review Labs Reviewed  POCT I-STAT, CHEM 8 - Abnormal; Notable for the following:    Creatinine, Ser 1.80 (*)    Glucose, Bld 134 (*)    Calcium, Ion 1.03 (*)    All other components within normal limits  CBC WITH DIFFERENTIAL  PRO B NATRIURETIC PEPTIDE   Imaging Review Dg Chest 1 View  03/24/2013   *RADIOLOGY REPORT*  Clinical Data: Cough and shortness of breath  CHEST - 1 VIEW  Comparison: 11/13/2006 and 11/12/2006  Findings:  Stable heart, mediastinal, hilar contours.  Normal pulmonary vascularity.  Low lung volumes. Peribronchial thickening is seen at the lung bases, and may be accentuated by low lung volumes. No definite pleural effusion.  No visible pneumothorax.  IMPRESSION:   Low lung volumes and bibasilar peribronchial thickening.  Peribronchial thickening may be accentuated by low lung volumes and can be seen in the setting of viral infection/bronchitis/smoking.  If there is clinical concern for pneumonia, consider follow-up PA and lateral chest radiographs when the patient can tolerate.   Original Report Authenticated By:  Curlene Dolphin, M.D.   Dg Abd 1 View  03/24/2013   *RADIOLOGY REPORT*  Clinical Data: Abdominal distention  ABDOMEN - 1 VIEW  Comparison: 11/15/2006  Findings: Retained stool noted in the transverse colon.  Negative for obstruction or ileus.  Calcifications over the renal shadows bilaterally suspicious for nonobstructing nephrolithiasis. Degenerative changes of both hips.  IMPRESSION: Nonobstructive bowel gas pattern.   Original Report Authenticated By: Jerilynn Mages. Annamaria Boots, M.D.    MDM   1. Community acquired pneumonia   2. Hypoxemia    Chest x-ray shows probable pneumonia. There is a significant creatinine bump. He will probably need at least monitoring and IV fluids, may be admitted for observation. Transferred to the emergency department for further evaluation and treatment/disposition    Liam Graham, PA-C 03/24/13 2126

## 2013-03-24 NOTE — ED Notes (Signed)
Report to ems

## 2013-03-25 MED ORDER — DEXTROSE 5 % IV SOLN
1.0000 g | Freq: Once | INTRAVENOUS | Status: AC
  Administered 2013-03-25: 1 g via INTRAVENOUS
  Filled 2013-03-25: qty 10

## 2013-03-25 MED ORDER — POLYETHYLENE GLYCOL 3350 17 GM/SCOOP PO POWD: 17.0000 g | Freq: Every day | ORAL | Status: DC

## 2013-03-25 MED ORDER — LACTULOSE 10 GM/15ML PO SOLN
20.0000 g | Freq: Once | ORAL | Status: AC
  Administered 2013-03-25: 20 g via ORAL
  Filled 2013-03-25: qty 30

## 2013-03-25 MED ORDER — AZITHROMYCIN 250 MG PO TABS
500.0000 mg | ORAL_TABLET | Freq: Once | ORAL | Status: AC
  Administered 2013-03-25: 500 mg via ORAL
  Filled 2013-03-25: qty 2

## 2013-03-25 MED ORDER — AZITHROMYCIN 250 MG PO TABS: 250.0000 mg | ORAL_TABLET | Freq: Every day | ORAL | Status: DC

## 2013-03-25 NOTE — Discharge Instructions (Signed)
Bacterial Conjunctivitis Bacterial conjunctivitis (commonly called pink eye) is redness, soreness, or puffiness (inflammation) of the white part of your eye. It is caused by a germ called bacteria. These germs can easily spread from person to person (contagious). Your eye often will become red or pink. Your eye may also become irritated, watery, or have a thick discharge.  HOME CARE   Apply a cool, clean washcloth over closed eyelids. Do this for 10 20 minutes, 3 4 times a day while you have pain.  Gently wipe away any fluid coming from the eye with a warm, wet washcloth or cotton ball.  Wash your hands often with soap and water. Use paper towels to dry your hands.  Do not share towels or washcloths.  Change or wash your pillowcase every day.  Do not use eye makeup until the infection is gone.  Do not use machines or drive if your vision is blurry.  Stop using contact lenses. Do not use them again until your doctor says it is okay.  Do not touch the tip of the eye drop bottle or medicine tube with your fingers when you put medicine on the eye. GET HELP RIGHT AWAY IF:   Your eye is not better after 3 days of starting your medicine.  You have a yellowish fluid coming out of the eye.  You have more pain in the eye.  Your eye redness is spreading.  Your vision becomes blurry.  You have a fever or lasting symptoms for more than 2-3 days.  You have a fever and your symptoms suddenly get worse.  You have pain in the face.  Your face gets red or puffy (swollen). MAKE SURE YOU:   Understand these instructions.  Will watch this condition.  Will get help right away if you are not doing well or get worse. Document Released: 04/18/2008 Document Revised: 06/26/2012 Document Reviewed: 04/18/2008 [facility] Patient Information 2014 [place].  Constipation, Adult Constipation is when a person:  Poops (bowel movement) less than 3 times a week.  Has a hard time  pooping.  Has poop that is dry, hard, or bigger than normal. HOME CARE   Eat more fiber, such as fruits, vegetables, whole grains like brown rice, and beans.  Eat less fatty foods and sugar. This includes [place] fries, hamburgers, cookies, candy, and soda.  If you are not getting enough fiber from food, take products with added fiber in them (supplements).  Drink enough fluid to keep your pee (urine) clear or pale yellow.  Go to the restroom when you feel like you need to poop. Do not hold it.  Only take medicine as told by your doctor. Do not take medicines that help you poop (laxatives) without talking to your doctor first.  Exercise on a regular basis, or as told by your doctor. GET HELP RIGHT AWAY IF:   You have bright red blood in your poop (stool).  Your constipation lasts more than 4 days or gets worse.  You have belly (abdomen) or butt (rectal) pain.  You have thin poop (as thin as a pencil).  You lose weight, and it cannot be explained. MAKE SURE YOU:   Understand these instructions.  Will watch your condition.  Will get help right away if you are not doing well or get worse. Document Released: 12/27/2007 Document Revised: 10/02/2011 Document Reviewed: 06/13/2011 [facility] Patient Information 2014 [place], [place]. Take the antibiotic starting tomorrow, as directed, one pill at bedtime.  Use the provided erythromycin ointment  4 times a day, to your right eye, uses for 24 hours.  After symptoms have resolved.  Also, start taking MiraLAX on a regular basis to bulk his stools if you find that you are having too frequent bowel movements.  Back off to every other day.  Please followup with your primary care physician if you develop worsening.  Symptoms.  Fever.  That will not resolve with the use of Tylenol or ibuprofen, he become acutely short of breath.  Please return for further treatment and possible admission

## 2013-03-28 ENCOUNTER — Telehealth: Payer: Self-pay | Admitting: Family Medicine

## 2013-03-28 MED ORDER — LEVOFLOXACIN 500 MG PO TABS: 500.0000 mg | ORAL_TABLET | Freq: Every day | ORAL | Status: DC

## 2013-03-28 NOTE — Telephone Encounter (Signed)
Eduardo Young aware and med sent to [facility]

## 2013-03-28 NOTE — Telephone Encounter (Signed)
levaquin 500 poqd for 7 days

## 2013-04-01 NOTE — ED Provider Notes (Signed)
Medical screening examination/treatment/procedure(s) were performed by non-physician practitioner and as supervising physician I was immediately available for consultation/collaboration. Rolland Porter, MD, Abram Sander   Janice Norrie, MD 04/01/13 [phone]

## 2013-04-08 ENCOUNTER — Other Ambulatory Visit: Payer: Self-pay | Admitting: Family Medicine

## 2013-04-08 NOTE — Telephone Encounter (Signed)
Medication refilled per protocol. 

## 2013-04-16 NOTE — ED Provider Notes (Signed)
Medical screening examination/treatment/procedure(s) were performed by non-physician practitioner and as supervising physician I was immediately available for consultation/collaboration. Rolland Porter, MD, FACEP   Janice Norrie, MD 04/16/13 [phone]

## 2013-05-06 DIAGNOSIS — E119 Type 2 diabetes mellitus without complications: Secondary | ICD-10-CM | POA: Diagnosis not present

## 2013-05-06 DIAGNOSIS — L608 Other nail disorders: Secondary | ICD-10-CM | POA: Diagnosis not present

## 2013-05-06 DIAGNOSIS — I872 Venous insufficiency (chronic) (peripheral): Secondary | ICD-10-CM | POA: Diagnosis not present

## 2013-05-07 ENCOUNTER — Other Ambulatory Visit: Payer: Self-pay | Admitting: Family Medicine

## 2013-06-08 ENCOUNTER — Other Ambulatory Visit: Payer: Self-pay | Admitting: Family Medicine

## 2013-06-20 ENCOUNTER — Other Ambulatory Visit: Payer: Self-pay | Admitting: Physician Assistant

## 2013-06-20 NOTE — Telephone Encounter (Signed)
Medication refilled per protocol. 

## 2013-07-09 ENCOUNTER — Other Ambulatory Visit: Payer: Self-pay | Admitting: Family Medicine

## 2013-07-09 DIAGNOSIS — I872 Venous insufficiency (chronic) (peripheral): Secondary | ICD-10-CM | POA: Diagnosis not present

## 2013-07-09 DIAGNOSIS — L608 Other nail disorders: Secondary | ICD-10-CM | POA: Diagnosis not present

## 2013-07-09 DIAGNOSIS — L97309 Non-pressure chronic ulcer of unspecified ankle with unspecified severity: Secondary | ICD-10-CM | POA: Diagnosis not present

## 2013-07-09 DIAGNOSIS — E119 Type 2 diabetes mellitus without complications: Secondary | ICD-10-CM | POA: Diagnosis not present

## 2013-08-10 ENCOUNTER — Other Ambulatory Visit: Payer: Self-pay | Admitting: Family Medicine

## 2013-09-06 ENCOUNTER — Other Ambulatory Visit: Payer: Self-pay | Admitting: Family Medicine

## 2013-09-30 ENCOUNTER — Telehealth: Payer: Self-pay | Admitting: Family Medicine

## 2013-09-30 DIAGNOSIS — E119 Type 2 diabetes mellitus without complications: Secondary | ICD-10-CM | POA: Diagnosis not present

## 2013-09-30 DIAGNOSIS — L608 Other nail disorders: Secondary | ICD-10-CM | POA: Diagnosis not present

## 2013-09-30 NOTE — Telephone Encounter (Signed)
Call back number is [phone] Pt is needing a referral for a new mattress and new wheelchair

## 2013-10-01 NOTE — Telephone Encounter (Signed)
Needs order for new mattress faxed to Adult & Peds specialist - Attn Forde Radon at [phone] Also needs order for new wheel chair, will need to call - Dian Situ [phone]

## 2013-10-03 NOTE — Telephone Encounter (Signed)
Faxed order for Mattress and received transmission complete.

## 2013-10-08 NOTE — Telephone Encounter (Signed)
Spoke to [place] - need to fax over a rx for a manual wheelchair to [phone]

## 2013-10-08 NOTE — Telephone Encounter (Signed)
Order faxed.

## 2013-10-08 NOTE — Telephone Encounter (Signed)
Call placed to [facility], had to leave message to call back.

## 2013-10-09 ENCOUNTER — Ambulatory Visit: Payer: Medicare Other | Admitting: Family Medicine

## 2013-10-24 ENCOUNTER — Telehealth: Payer: Self-pay | Admitting: Family Medicine

## 2013-10-24 NOTE — Telephone Encounter (Signed)
Spoke to [employer] and she stated that the Adult and peds specialist could not get him another mattress and wanted another order faxed to [facility]. I [facility]ed in another order to [facility] and received confirmation that they received it. Diana aware.

## 2013-10-24 NOTE — Telephone Encounter (Signed)
Message copied by Alyson Locket on Fri Oct 24, 2013  9:10 AM ------      Message from: Lenore Manner      Created: Mon Oct 20, 2013  4:49 PM      Regarding: RX for Bed      Contact: [phone]       Vincents sister Konrad Hoak is wanting to speak to you about the prescription for the bed. She is stating that it is needing to be re faxed to [facility] [address] ------

## 2013-11-15 ENCOUNTER — Encounter (HOSPITAL_COMMUNITY): Payer: Self-pay | Admitting: Emergency Medicine

## 2013-11-15 ENCOUNTER — Emergency Department (HOSPITAL_COMMUNITY): Payer: Medicare Other

## 2013-11-15 ENCOUNTER — Emergency Department (HOSPITAL_COMMUNITY)
Admission: EM | Admit: 2013-11-15 | Discharge: 2013-11-15 | Disposition: A | Discharge status: Home / Self Care | Payer: Medicare Other | Attending: Emergency Medicine | Admitting: Emergency Medicine

## 2013-11-15 DIAGNOSIS — Z8669 Personal history of other diseases of the nervous system and sense organs: Secondary | ICD-10-CM | POA: Diagnosis not present

## 2013-11-15 DIAGNOSIS — I10 Essential (primary) hypertension: Secondary | ICD-10-CM | POA: Diagnosis not present

## 2013-11-15 DIAGNOSIS — Z792 Long term (current) use of antibiotics: Secondary | ICD-10-CM | POA: Diagnosis not present

## 2013-11-15 DIAGNOSIS — Z87891 Personal history of nicotine dependence: Secondary | ICD-10-CM | POA: Insufficient documentation

## 2013-11-15 DIAGNOSIS — R748 Abnormal levels of other serum enzymes: Secondary | ICD-10-CM | POA: Insufficient documentation

## 2013-11-15 DIAGNOSIS — N509 Disorder of male genital organs, unspecified: Secondary | ICD-10-CM | POA: Insufficient documentation

## 2013-11-15 DIAGNOSIS — Z8719 Personal history of other diseases of the digestive system: Secondary | ICD-10-CM | POA: Diagnosis not present

## 2013-11-15 DIAGNOSIS — Z7982 Long term (current) use of aspirin: Secondary | ICD-10-CM | POA: Insufficient documentation

## 2013-11-15 DIAGNOSIS — E785 Hyperlipidemia, unspecified: Secondary | ICD-10-CM | POA: Diagnosis not present

## 2013-11-15 DIAGNOSIS — Z8673 Personal history of transient ischemic attack (TIA), and cerebral infarction without residual deficits: Secondary | ICD-10-CM | POA: Diagnosis not present

## 2013-11-15 DIAGNOSIS — E669 Obesity, unspecified: Secondary | ICD-10-CM | POA: Insufficient documentation

## 2013-11-15 DIAGNOSIS — M7989 Other specified soft tissue disorders: Secondary | ICD-10-CM | POA: Insufficient documentation

## 2013-11-15 DIAGNOSIS — M109 Gout, unspecified: Secondary | ICD-10-CM | POA: Diagnosis not present

## 2013-11-15 DIAGNOSIS — Z79899 Other long term (current) drug therapy: Secondary | ICD-10-CM | POA: Insufficient documentation

## 2013-11-15 DIAGNOSIS — N200 Calculus of kidney: Secondary | ICD-10-CM | POA: Diagnosis not present

## 2013-11-15 DIAGNOSIS — N201 Calculus of ureter: Secondary | ICD-10-CM | POA: Diagnosis not present

## 2013-11-15 DIAGNOSIS — E119 Type 2 diabetes mellitus without complications: Secondary | ICD-10-CM | POA: Insufficient documentation

## 2013-11-15 HISTORY — DX: Hemiplegia, unspecified affecting unspecified side: G81.90

## 2013-11-15 LAB — URINALYSIS, ROUTINE W REFLEX MICROSCOPIC
Bilirubin Urine: NEGATIVE
Glucose, UA: NEGATIVE mg/dL
Ketones, ur: NEGATIVE mg/dL
Leukocytes, UA: NEGATIVE
Nitrite: NEGATIVE
Protein, ur: 30 mg/dL — AB
Specific Gravity, Urine: 1.013 (ref 1.005–1.030)
Urobilinogen, UA: 1 mg/dL (ref 0.0–1.0)
pH: 6 (ref 5.0–8.0)

## 2013-11-15 LAB — COMPREHENSIVE METABOLIC PANEL
ALT: 64 U/L — ABNORMAL HIGH (ref 0–53)
AST: 64 U/L — ABNORMAL HIGH (ref 0–37)
Albumin: 3.6 g/dL (ref 3.5–5.2)
Alkaline Phosphatase: 89 U/L (ref 39–117)
BUN: 23 mg/dL (ref 6–23)
CO2: 25 mEq/L (ref 19–32)
Calcium: 9.5 mg/dL (ref 8.4–10.5)
Chloride: 100 mEq/L (ref 96–112)
Creatinine, Ser: 1.39 mg/dL — ABNORMAL HIGH (ref 0.50–1.35)
GFR calc Af Amer: 64 mL/min — ABNORMAL LOW (ref >90)
GFR calc non Af Amer: 56 mL/min — ABNORMAL LOW (ref >90)
Glucose, Bld: 165 mg/dL — ABNORMAL HIGH (ref 70–99)
Potassium: 3.7 mEq/L (ref 3.7–5.3)
Sodium: 140 mEq/L (ref 137–147)
Total Bilirubin: 0.5 mg/dL (ref 0.3–1.2)
Total Protein: 7.3 g/dL (ref 6.0–8.3)

## 2013-11-15 LAB — CBC WITH DIFFERENTIAL/PLATELET
Basophils Absolute: 0 10*3/uL (ref 0.0–0.1)
Basophils Relative: 0 % (ref 0–1)
Eosinophils Absolute: 0.9 10*3/uL — ABNORMAL HIGH (ref 0.0–0.7)
Eosinophils Relative: 8 % — ABNORMAL HIGH (ref 0–5)
HCT: 37.5 % — ABNORMAL LOW (ref 39.0–52.0)
Hemoglobin: 12.6 g/dL — ABNORMAL LOW (ref 13.0–17.0)
Lymphocytes Relative: 16 % (ref 12–46)
Lymphs Abs: 1.7 10*3/uL (ref 0.7–4.0)
MCH: 28.6 pg (ref 26.0–34.0)
MCHC: 33.6 g/dL (ref 30.0–36.0)
MCV: 85 fL (ref 78.0–100.0)
Monocytes Absolute: 0.8 10*3/uL (ref 0.1–1.0)
Monocytes Relative: 7 % (ref 3–12)
Neutro Abs: 7.8 10*3/uL — ABNORMAL HIGH (ref 1.7–7.7)
Neutrophils Relative %: 70 % (ref 43–77)
Platelets: 223 10*3/uL (ref 150–400)
RBC: 4.41 MIL/uL (ref 4.22–5.81)
RDW: 14.5 % (ref 11.5–15.5)
WBC: 11.2 10*3/uL — ABNORMAL HIGH (ref 4.0–10.5)

## 2013-11-15 LAB — URINE MICROSCOPIC-ADD ON

## 2013-11-15 MED ORDER — OXYCODONE-ACETAMINOPHEN 5-325 MG PO TABS
2.0000 | ORAL_TABLET | Freq: Once | ORAL | Status: AC
  Administered 2013-11-15: 2 via ORAL
  Filled 2013-11-15: qty 2

## 2013-11-15 MED ORDER — KETOROLAC TROMETHAMINE 30 MG/ML IJ SOLN
30.0000 mg | Freq: Once | INTRAMUSCULAR | Status: AC
  Administered 2013-11-15: 30 mg via INTRAVENOUS
  Filled 2013-11-15: qty 1

## 2013-11-15 MED ORDER — SODIUM CHLORIDE 0.9 % IV BOLUS (SEPSIS)
1000.0000 mL | Freq: Once | INTRAVENOUS | Status: AC
  Administered 2013-11-15: 1000 mL via INTRAVENOUS

## 2013-11-15 MED ORDER — ONDANSETRON 4 MG PO TBDP: 4.0000 mg | ORAL_TABLET | Freq: Three times a day (TID) | ORAL | Status: DC | PRN

## 2013-11-15 MED ORDER — HYDROMORPHONE HCL PF 1 MG/ML IJ SOLN
0.5000 mg | Freq: Once | INTRAMUSCULAR | Status: AC
  Administered 2013-11-15: 0.5 mg via INTRAVENOUS
  Filled 2013-11-15: qty 1

## 2013-11-15 MED ORDER — OXYCODONE-ACETAMINOPHEN 5-325 MG PO TABS: 2.0000 | ORAL_TABLET | ORAL | Status: DC | PRN

## 2013-11-15 MED ORDER — TAMSULOSIN HCL 0.4 MG PO CAPS: 0.4000 mg | ORAL_CAPSULE | Freq: Every day | ORAL | Status: DC

## 2013-11-15 MED ORDER — HYDROMORPHONE HCL PF 1 MG/ML IJ SOLN
1.0000 mg | Freq: Once | INTRAMUSCULAR | Status: AC
  Administered 2013-11-15: 1 mg via INTRAVENOUS
  Filled 2013-11-15: qty 1

## 2013-11-15 NOTE — ED Provider Notes (Signed)
Medical screening examination/treatment/procedure(s) were performed by non-physician practitioner and as supervising physician I was immediately available for consultation/collaboration.   EKG Interpretation None       Leota Jacobsen, MD 11/15/13 [phone]

## 2013-11-15 NOTE — ED Provider Notes (Signed)
CSN: [id number]     Arrival date & time 11/15/13  3545 History   None    Chief Complaint  Patient presents with  . Flank Pain   HPI  Eduardo Young is a 58 y.o. male with a PMH of kidney stones, diabetes mellitus, hyperlipidemia, HTN, gout, sleep apnea, stroke with residual left sided hemiparesis, GERD, chronic venous insufficiency who presents to the ED for evaluation of flank pain. History was provided by the patient. Patient states he developed left flank pain suddenly over the past two hours PTA. Patient states the pain woke up out of his sleep. Pain is described as a constant sharp pain with radiation to the LLQ, left lower back and left testicle. Movement makes his pain worse. Nothing improves his pain. He did not take anything for pain PTA. Has hx of kidney stones but unsure if this is similar to his pain today. Has had kidney stones pass at home off and on for years but states this is worse than his usual pain. He has a hx of lithotripsy in the past. No urologist. No previous abdominal surgeries. No emesis, nausea, diarrhea, constipation, dysuria, hematuria. Patient otherwise has been well with no fevers, chills, change in appetite/activity. Has bilateral leg edema which is his baseline. No chest pain, SOB, or wheezing.     Past Medical History  Diagnosis Date  . Diabetes mellitus   . Hyperlipidemia   . Hypertension   . Gout   . Sleep apnea, obstructive   . Stroke     [id number]  . GERD (gastroesophageal reflux disease)   . Obesity   . Renal calculi   . Chronic venous insufficiency   . Hemiparesis    History reviewed. No pertinent past surgical history. No family history on file. History  Substance Use Topics  . Smoking status: Former Research scientist (life sciences)  . Smokeless tobacco: Not on file  . Alcohol Use: Yes     Comment: Occasional    Review of Systems  Constitutional: Negative for fever, chills, diaphoresis, activity change, appetite change and fatigue.  Respiratory: Negative for  cough and shortness of breath.   Cardiovascular: Positive for leg swelling (baseline). Negative for chest pain.  Gastrointestinal: Positive for abdominal pain. Negative for nausea, vomiting, diarrhea, constipation and blood in stool.  Genitourinary: Positive for flank pain and testicular pain. Negative for dysuria, urgency, frequency, hematuria, decreased urine volume, discharge, difficulty urinating, genital sores and penile pain.  Musculoskeletal: Positive for back pain. Negative for joint swelling and myalgias.  Neurological: Negative for dizziness, weakness, light-headedness and headaches.    Allergies  Review of patient's allergies indicates no known allergies.  Home Medications   Prior to Admission medications   Medication Sig Start Date End Date Taking? Authorizing Provider  allopurinol (ZYLOPRIM) 100 MG tablet Take 100 mg by mouth 2 (two) times daily.    Historical Provider, MD  allopurinol (ZYLOPRIM) 100 MG tablet TAKE ONE TABLET BY MOUTH TWICE DAILY 08/10/13   Susy Frizzle, MD  amLODipine (NORVASC) 10 MG tablet TAKE ONE TABLET BY MOUTH ONCE DAILY 08/10/13   Susy Frizzle, MD  aspirin 81 MG tablet Take 81 mg by mouth every evening.     Historical Provider, MD  azithromycin (ZITHROMAX) 250 MG tablet Take 1 tablet (250 mg total) by mouth daily. 03/25/13   Garald Balding, NP  bisacodyl (DULCOLAX) 5 MG EC tablet Take 5 mg by mouth daily as needed for constipation.    Historical Provider, MD  doxazosin (CARDURA)  2 MG tablet Take 2 mg by mouth every morning.     Historical Provider, MD  doxazosin (CARDURA) 4 MG tablet Take 1 tablet (4 mg total) by mouth daily. 08/10/13   Susy Frizzle, MD  labetalol (NORMODYNE) 300 MG tablet Take 300 mg by mouth 2 (two) times daily.    Historical Provider, MD  labetalol (NORMODYNE) 300 MG tablet TAKE ONE TABLET BY MOUTH TWICE DAILY    Susy Frizzle, MD  LANTUS 100 UNIT/ML injection INJECT 12 UNITS SUBCUTANEOUSLY TWICE DAILY 06/20/13   Susy Frizzle, MD  levofloxacin (LEVAQUIN) 500 MG tablet Take 1 tablet (500 mg total) by mouth daily. X 7 days 03/28/13   Susy Frizzle, MD  lisinopril (PRINIVIL,ZESTRIL) 20 MG tablet TAKE ONE TABLET BY MOUTH TWICE DAILY 08/10/13   Susy Frizzle, MD  metFORMIN (GLUCOPHAGE) 500 MG tablet TAKE ONE TABLET BY MOUTH TWICE DAILY 08/10/13   Susy Frizzle, MD  Multiple Vitamin (MULTIVITAMIN WITH MINERALS) TABS tablet Take 1 tablet by mouth daily.    Historical Provider, MD  polyethylene glycol powder (GLYCOLAX/MIRALAX) powder Take 17 g by mouth daily. 03/25/13   Garald Balding, NP  Pseudoeph-Doxylamine-DM-APAP (DAYQUIL/NYQUIL COLD/FLU RELIEF PO) Take 2 capsules by mouth 2 (two) times daily as needed (for cold).    Historical Provider, MD  simvastatin (ZOCOR) 40 MG tablet Take 1 tablet (40 mg total) by mouth at bedtime. 08/10/13   Susy Frizzle, MD   BP 174/91  Pulse 66  Temp(Src) 97.9 F (36.6 C) (Oral)  Resp 18  Ht 6' 4"  (1.93 m)  Wt 295 lb (133.811 kg)  BMI 35.92 kg/m2  SpO2 93%  Filed Vitals:   11/15/13 0637 11/15/13 0703 11/15/13 0832  BP: 174/91 169/94 162/87  Pulse: 66 62 57  Temp: 97.9 F (36.6 C)    TempSrc: Oral    Resp: 18 20 18   Height: 6' 4"  (1.93 m)    Weight: 295 lb (133.811 kg)    SpO2: 93% 94% 94%    Physical Exam  Nursing note and vitals reviewed. Constitutional: He is oriented to person, place, and time. He appears well-developed and well-nourished. No distress.  HENT:  Head: Normocephalic and atraumatic.  Right Ear: External ear normal.  Left Ear: External ear normal.  Nose: Nose normal.  Mouth/Throat: Oropharynx is clear and moist.  Eyes: Conjunctivae are normal. Right eye exhibits no discharge. Left eye exhibits no discharge.  Neck: Normal range of motion. Neck supple.  Cardiovascular: Normal rate, regular rhythm and normal heart sounds.  Exam reveals no gallop and no friction rub.   No murmur heard. Pulmonary/Chest: Effort normal and breath sounds normal. No  respiratory distress. He has no wheezes. He has no rales. He exhibits no tenderness.  Abdominal: Soft. Bowel sounds are normal. He exhibits no distension and no mass. There is tenderness. There is no rebound and no guarding.  Left flank, LLQ, and left lumbar tenderness to palpation.   Genitourinary:  No testicular tenderness bilaterally. No genital sores, penile swelling/discharge, or scrotal edema/erythema.   Musculoskeletal: Normal range of motion. He exhibits edema. He exhibits no tenderness.  Pitting 1+ edema bilaterally. Right arm weakness (baseline).   Neurological: He is alert and oriented to person, place, and time.  Skin: Skin is warm and dry. He is not diaphoretic.    ED Course  Procedures (including critical care time) Labs Review Labs Reviewed - No data to display  Imaging Review Ct Abdomen Pelvis Wo Contrast  11/15/2013   CLINICAL DATA:  Left flank pain with history of kidney stones and lithotripsy  EXAM: CT ABDOMEN AND PELVIS WITHOUT CONTRAST  TECHNIQUE: Multidetector CT imaging of the abdomen and pelvis was performed following the standard protocol without intravenous contrast.  COMPARISON:  DG ABD 1 VIEW dated 03/24/2013; DG ABD PORTABLE 1V dated 11/15/2006; CT ABD W/CM dated 10/10/2003  FINDINGS: No acute musculoskeletal abnormalities. Visualized portions of the lung bases are clear.  Liver, gallbladder, spleen, pancreas, and right adrenal gland are normal. 1 cm nodule inferior left adrenal gland stable from 10/10/2003. Numerous bilateral renal calculi. No hydronephrosis on the right. On the left, the calices and renal pelvis are mildly to moderately dilated as is the proximal left ureter. There is a 9 x 5 x 5 mm calculus at the junction of the proximal and middle thirds of the left ureter. There are no stones distally. Within the left kidney there are numerous calculi in the middle and lower pole, largest measuring about 7 mm.  Bowel is normal. No ascites. Bladder and reproductive  organs are normal.  IMPRESSION: Obstructive nephropathy on the left due to the presence of a stone in the proximal left ureter.   Electronically Signed   By: Skipper Cliche M.D.   On: 11/15/2013 08:15     EKG Interpretation None      Results for orders placed during the hospital encounter of 11/15/13  CBC WITH DIFFERENTIAL      Result Value Ref Range   WBC 11.2 (*) 4.0 - 10.5 K/uL   RBC 4.41  4.22 - 5.81 MIL/uL   Hemoglobin 12.6 (*) 13.0 - 17.0 g/dL   HCT 37.5 (*) 39.0 - 52.0 %   MCV 85.0  78.0 - 100.0 fL   MCH 28.6  26.0 - 34.0 pg   MCHC 33.6  30.0 - 36.0 g/dL   RDW 14.5  11.5 - 15.5 %   Platelets 223  150 - 400 K/uL   Neutrophils Relative % 70  43 - 77 %   Neutro Abs 7.8 (*) 1.7 - 7.7 K/uL   Lymphocytes Relative 16  12 - 46 %   Lymphs Abs 1.7  0.7 - 4.0 K/uL   Monocytes Relative 7  3 - 12 %   Monocytes Absolute 0.8  0.1 - 1.0 K/uL   Eosinophils Relative 8 (*) 0 - 5 %   Eosinophils Absolute 0.9 (*) 0.0 - 0.7 K/uL   Basophils Relative 0  0 - 1 %   Basophils Absolute 0.0  0.0 - 0.1 K/uL  COMPREHENSIVE METABOLIC PANEL      Result Value Ref Range   Sodium 140  137 - 147 mEq/L   Potassium 3.7  3.7 - 5.3 mEq/L   Chloride 100  96 - 112 mEq/L   CO2 25  19 - 32 mEq/L   Glucose, Bld 165 (*) 70 - 99 mg/dL   BUN 23  6 - 23 mg/dL   Creatinine, Ser 1.39 (*) 0.50 - 1.35 mg/dL   Calcium 9.5  8.4 - 10.5 mg/dL   Total Protein 7.3  6.0 - 8.3 g/dL   Albumin 3.6  3.5 - 5.2 g/dL   AST 64 (*) 0 - 37 U/L   ALT 64 (*) 0 - 53 U/L   Alkaline Phosphatase 89  39 - 117 U/L   Total Bilirubin 0.5  0.3 - 1.2 mg/dL   GFR calc non Af Amer 56 (*) >90 mL/min   GFR calc Af Amer 64 (*) >  90 mL/min  URINALYSIS, ROUTINE W REFLEX MICROSCOPIC      Result Value Ref Range   Color, Urine YELLOW  YELLOW   APPearance CLEAR  CLEAR   Specific Gravity, Urine 1.013  1.005 - 1.030   pH 6.0  5.0 - 8.0   Glucose, UA NEGATIVE  NEGATIVE mg/dL   Hgb urine dipstick SMALL (*) NEGATIVE   Bilirubin Urine NEGATIVE   NEGATIVE   Ketones, ur NEGATIVE  NEGATIVE mg/dL   Protein, ur 30 (*) NEGATIVE mg/dL   Urobilinogen, UA 1.0  0.0 - 1.0 mg/dL   Nitrite NEGATIVE  NEGATIVE   Leukocytes, UA NEGATIVE  NEGATIVE  URINE MICROSCOPIC-ADD ON      Result Value Ref Range   Squamous Epithelial / LPF RARE  RARE   RBC / HPF 7-10  <3 RBC/hpf    MDM   MANAV PIEROTTI is a 58 y.o. male with a PMH of kidney stones, diabetes mellitus, hyperlipidemia, HTN, gout, sleep apnea, stroke with residual left sided hemiparesis, GERD, chronic venous insufficiency who presents to the ED for evaluation of flank pain  Rechecks  8:45 AM = Pain 8/10. Improved since Dilaudid, however, not controlled. Ordering Toradol.  11:00 AM = Pain controlled. States ok to be discharged home. States did not take BP medications this morning.    Etiology of left flank pain likely due to to an obstructed left kidney stone (9 x 5 x 5 mm). No evidence of a UTI. Pain controlled during ED visit. Patient afebrile and non-toxic in appearance. Mile leukocytosis (11.2). Mild elevated creatinine (1.39) improved from baseline. Mild elevated liver enzymes. CT report given to family per request. Incidental findings discussed. Patient instructed to follow-up with PCP and was given urology referral. BP elevated during ED visit. Did not take BP medications. Instructed to take these when he returns home. Return precautions, discharge instructions, and follow-up was discussed with the patient before discharge.     Discharge Medication List as of 11/15/2013 11:05 AM    START taking these medications   Details  ondansetron (ZOFRAN ODT) 4 MG disintegrating tablet Take 1 tablet (4 mg total) by mouth every 8 (eight) hours as needed for nausea., Starting 11/15/2013, Until Discontinued, Print    oxyCODONE-acetaminophen (PERCOCET/ROXICET) 5-325 MG per tablet Take 2 tablets by mouth every 4 (four) hours as needed for severe pain., Starting 11/15/2013, Until Discontinued, Print     tamsulosin (FLOMAX) 0.4 MG CAPS capsule Take 1 capsule (0.4 mg total) by mouth daily., Starting 11/15/2013, Until Discontinued, Print         Final impressions: 1. Left nephrolithiasis   2. Elevated liver enzymes   3. Hypertension      Mercy Moore PA-C   This patient was discussed with Dr. Marella Bile, PA-C 11/15/13 1251

## 2013-11-15 NOTE — Discharge Instructions (Signed)
Take Percocet for severe pain - Please be careful with this medication.  It can cause drowsiness.  Use caution while driving, operating machinery, drinking alcohol, or any other activities that may impair your physical or mental abilities.   Take Zofran as needed for nausea and vomiting  Take Flomax - this will help you pass the stone  Your labs showed mild elevation in liver enzymes and kidney injury (improved from your baseline) - please follow-up with your doctor regarding this  Drink plenty of fluids and rest  Strain your urine to try to catch the stone  Your BP was elevated during your ED visit - please follow-up with your doctor regarding this  Follow-up with urology as soon as possible - you may need intervention  Return to the emergency department if you develop any changing/worsening condition, worsening pain, fever, repeated vomiting, or any other concerns (please read additional information regarding your condition below)   Kidney Stones Kidney stones (urolithiasis) are deposits that form inside your kidneys. The intense pain is caused by the stone moving through the urinary tract. When the stone moves, the ureter goes into spasm around the stone. The stone is usually passed in the urine.  CAUSES   A disorder that makes certain neck glands produce too much parathyroid hormone (primary hyperparathyroidism).  A buildup of uric acid crystals, similar to gout in your joints.  Narrowing (stricture) of the ureter.  A kidney obstruction present at birth (congenital obstruction).  Previous surgery on the kidney or ureters.  Numerous kidney infections. SYMPTOMS   Feeling sick to your stomach (nauseous).  Throwing up (vomiting).  Blood in the urine (hematuria).  Pain that usually spreads (radiates) to the groin.  Frequency or urgency of urination. DIAGNOSIS   Taking a history and physical exam.  Blood or urine tests.  CT scan.  Occasionally, an examination of the inside  of the urinary bladder (cystoscopy) is performed. TREATMENT   Observation.  Increasing your fluid intake.  Extracorporeal shock wave lithotripsy This is a noninvasive procedure that uses shock waves to break up kidney stones.  Surgery may be needed if you have severe pain or persistent obstruction. There are various surgical procedures. Most of the procedures are performed with the use of small instruments. Only small incisions are needed to accommodate these instruments, so recovery time is minimized. The size, location, and chemical composition are all important variables that will determine the proper choice of action for you. Talk to your health care provider to better understand your situation so that you will minimize the risk of injury to yourself and your kidney.  HOME CARE INSTRUCTIONS   Drink enough water and fluids to keep your urine clear or pale yellow. This will help you to pass the stone or stone fragments.  Strain all urine through the provided strainer. Keep all particulate matter and stones for your health care provider to see. The stone causing the pain may be as small as a grain of salt. It is very important to use the strainer each and every time you pass your urine. The collection of your stone will allow your health care provider to analyze it and verify that a stone has actually passed. The stone analysis will often identify what you can do to reduce the incidence of recurrences.  Only take over-the-counter or prescription medicines for pain, discomfort, or fever as directed by your health care provider.  Make a follow-up appointment with your health care provider as directed.  Get follow-up  X-rays if required. The absence of pain does not always mean that the stone has passed. It may have only stopped moving. If the urine remains completely obstructed, it can cause loss of kidney function or even complete destruction of the kidney. It is your responsibility to make sure  X-rays and follow-ups are completed. Ultrasounds of the kidney can show blockages and the status of the kidney. Ultrasounds are not associated with any radiation and can be performed easily in a matter of minutes. SEEK MEDICAL CARE IF:  You experience pain that is progressive and unresponsive to any pain medicine you have been prescribed. SEEK IMMEDIATE MEDICAL CARE IF:   Pain cannot be controlled with the prescribed medicine.  You have a fever or shaking chills.  The severity or intensity of pain increases over 18 hours and is not relieved by pain medicine.  You develop a new onset of abdominal pain.  You feel faint or pass out.  You are unable to urinate. MAKE SURE YOU:   Understand these instructions.  Will watch your condition.  Will get help right away if you are not doing well or get worse. Document Released: 07/10/2005 Document Revised: 03/12/2013 Document Reviewed: 12/11/2012 [facility] Patient Information 2014 [place].   Emergency Department Resource Guide 1) Find a Doctor and Pay Out of Pocket Although you won't have to find out who is covered by your insurance plan, it is a good idea to ask around and get recommendations. You will then need to call the office and see if the doctor you have chosen will accept you as a new patient and what types of options they offer for patients who are self-pay. Some doctors offer discounts or will set up payment plans for their patients who do not have insurance, but you will need to ask so you aren't surprised when you get to your appointment.  2) Contact Your Local Health Department Not all health departments have doctors that can see patients for sick visits, but many do, so it is worth a call to see if yours does. If you don't know where your local health department is, you can check in your phone book. The CDC also has a tool to help you locate your state's health department, and many state websites also have listings of all of  their local health departments.  3) Find a [facility] If your illness is not likely to be very severe or complicated, you may want to try a walk in clinic. These are popping up all over the country in pharmacies, drugstores, and shopping centers. They're usually staffed by nurse practitioners or physician assistants that have been trained to treat common illnesses and complaints. They're usually fairly quick and inexpensive. However, if you have serious medical issues or chronic medical problems, these are probably not your best option.  No Primary Care Doctor: - Call Health Connect at  [phone] - they can help you locate a primary care doctor that  accepts your insurance, provides certain services, etc. - Physician Referral Service- [phone]  Chronic Pain Problems: Organization         Address  Phone   Notes  [facility]  [phone] Patients need to be referred by their primary care doctor.   Medication Assistance: Organization         Address  Phone   Notes  [facility] [address] [place], [place] [address] [phone] --Must be a resident of  [place] -- Must have NO insurance coverage whatsoever (no Medicaid/ Medicare, etc.) -- The pt. MUST have a primary care doctor that directs their care regularly and follows them in the community   MedAssist  [phone]   [employer]  [phone]    Agencies that provide inexpensive medical care: Organization         Address  Phone   Notes  [place]  [phone]   [facility]    [phone]   [facility] [place], [place] [address] [phone]   [place] [address] [place] [phone]   Planned Parenthood    [phone]   [facility]    [phone]   [place] and [place],  [place] Phone:  [phone], Fax:  [phone] Hours of Operation:  9 am - 6 pm, M-F.  Also accepts Medicaid/Medicare and self-pay.  [facility] for [place] [address], [address], [place] Phone: [phone], Fax: [phone]. Hours of Operation:  8:30 am - 5:30 pm, M-F.  Also accepts Medicaid and self-pay.  [facility] [place] [address] [place] Phone: [phone]   [place], [employer], [place] [phone], Ext. [phone] Mondays & Thursdays: 7-9 AM.  First 15 patients are seen on a first come, first serve basis.    [place] Providers:  Organization         Address  Phone   Notes  [facility] [address], [place] [phone] Also accepts self-pay patients.  [facility] [address], [place]  [phone]   [place], [address], [place] [phone]   [facility] [address], [place] [phone]   Lucianne Lei [address], [place]   [phone] Only accepts [place] Access [place] patients after they have their name applied to their card.   Self-Pay (no insurance) in [facility]:  Organization         Address  Phone   Notes  Sickle Cell Patients, [facility] [place] [phone]   [facility] [phone]   [facility] [place], [place], [place] [phone]   [facility]/Dr. Osei-Bonsu  [address] [place] or [address], [place] [phone] Phone number for both [place] and [place] locations is the same.  Urgent Medical and [facility] [address], [place] [phone]   [facility] [address] [place] or [address] [phone]   [facility] [address] [place] [phone], phone; [phone], fax Sees patients 1st and 3rd Saturday of every month.  Must not qualify for public or private insurance (i.e. Medicaid, Medicare, [place] Health Choice, Veterans' Benefits)  Household income should be no more than 200% of the poverty level The clinic cannot treat you if you are pregnant or think you are pregnant  Sexually transmitted diseases are not treated at the clinic.    Dental Care: Organization         Address  Phone  Notes  [facility] Department of [facility] [place], [place] [phone] Accepts children up to age 77 who are enrolled in [place] or [place]; pregnant women with a Medicaid card; and children who have applied for Medicaid or [place] Health Choice, but were declined, whose parents can pay a reduced fee at time of service.  [facility] Department of [facility]  [address], [place] [phone] Accepts children up to age 33 who are enrolled in [place] or [place]; pregnant women with a Medicaid card; and children who have applied for Medicaid or [place] Health Choice, but were declined, whose parents can pay a reduced fee at time of service.  [place] [facility] Access PROGRAM  [place] [phone] Patients are seen by appointment only. Walk-ins are not accepted. [place] will see patients 26 years of age and older. Monday - Tuesday (8am-5pm) Most Wednesdays (8:30-5pm) $30 per visit, cash only  [facility] Access PRO[facility]  [address], [facility] [phone] Patients are seen by appointment only. Walk-ins are not accepted. [place] will see patients 36 years of age and older. One Wednesday Evening (Monthly: Volunteer Based).  $30 per visit, cash only  [place]   [phone] for adults; Children under age 23, call Graduate Pediatric Dentistry at [phone]. Children aged 29-14, please call [phone] to request a pediatric application.  Dental services are provided in all areas of dental care including fillings, crowns and bridges, complete and partial dentures, implants, gum treatment, root canals, and extractions. Preventive care is also provided. Treatment is provided to both adults and children. Patients are selected via a lottery and there is often a waiting list.   [facility] [address], [place]  [phone] [email or web address]   [facility] [address] [place], [place] [phone], Ext. [phone] Second and Fourth Thursday of each month, opens at 6:30 AM; Clinic ends at 9 AM.  Patients are seen on a first-come first-served basis, and a limited number are seen during each clinic.   [facility]  [address] Hillard Danker [place], [place] [phone]   Eligibility Requirements You must have lived in [place], [place], or [place] counties for at least the last three months.   You cannot be eligible for state or federal sponsored [employer], including [employer], [place], or [employer].   You generally cannot be eligible for healthcare insurance through your employer.    How to apply: Eligibility screenings are held every Tuesday and Wednesday afternoon from 1:00 pm until 4:00 pm. You do not need an appointment for the interview!  [facility] [address], [place], [place]   [place]  [place] Department  [place]  [phone]    Behavioral Health Resources in the Community: Intensive Outpatient Programs Organization         Address  Phone  Notes  [place]. [address], [place], [place] [phone]   [facility] [address] [place], [place]   ADS: Alcohol & Drug Svcs [address] [place], [place]   [place] [address]  [place], [place] or [phone]   Substance Abuse Resources Organization  Address  Phone  Notes  Alcohol and Drug Services  [phone]   [place]  [phone]   [facility] [place]  [phone]   [place]inita Pester  [phone]   Residential & Outpatient Substance Abuse Program  [phone]   Psychological Services Organization         Address  Phone  Notes  [facility] [place]  [place]  [phone]   [place] [address], [place] or [phone]    Mobile Crisis Teams Organization         Address  Phone  Notes  [facility], Mobile Crisis Care Unit  [phone]   [facility]  [address] [place], [place]   [place] [address], [place] [phone]    Self-Help/Support Groups Organization         Address  Phone             Notes  [place] of [place] - variety of support groups  [place] Call for more information  Narcotics Anonymous (NA), [facility] [address], [employer] [place]  2 meetings at this location   Special educational needs teacher         Address  Phone  Notes  [facility] [place],    [place]  [phone]   [facility]  [address], [place] 381771, [place], [place]   [place], [place] [phone] Admissions: 8am-3pm M-F  Incentives Substance [place] [address]    [place], [place] [phone]   [facility] [address] [place], [place], [place]   [facility] [address],  [place], [place]     [facility] Programs - Intensive Outpatient [address] Kristeen [address], [place], [place]   [facility] ([place].) [place].,  [place], [place] [phone] or [phone]   [facility] (RTS) [address], [place], [place] Accepts Medicaid  [facility] [place] [address]  [place] [phone] Substance Abuse/Addiction Treatment   [facility] Organization         Address  Phone  Notes  [facility]  [phone]   Domenic Schwab, PhD [address] Arlis Porta [place], [place]   [phone] or [phone]   [facility] [place] [facility] [place], [place] [phone]   [facility] [address], [place], [place] [phone] Insurance/Medicaid/sponsorship through [facility] and Families [address] [place]                                    [place], [place] [phone] [place] [address], [place] [phone]    Dr. Adele Schilder  [phone]   [facility] [place] Dept. 1) [address] [place] 2) [place] 3)  [place] [address], [place] [phone]  [phone]   [place] [phone] or [phone] (After Hours)

## 2013-11-15 NOTE — ED Notes (Signed)
PA at bedside.

## 2013-11-15 NOTE — ED Notes (Signed)
Pt c/o L flank pain x 2 hours, severe, sharp radiating to LLQ, hx of kidney stones.

## 2013-11-15 NOTE — ED Notes (Signed)
Pt aware of the need for a urine sample.

## 2013-12-09 DIAGNOSIS — I872 Venous insufficiency (chronic) (peripheral): Secondary | ICD-10-CM | POA: Diagnosis not present

## 2013-12-09 DIAGNOSIS — L608 Other nail disorders: Secondary | ICD-10-CM | POA: Diagnosis not present

## 2013-12-09 DIAGNOSIS — L2089 Other atopic dermatitis: Secondary | ICD-10-CM | POA: Diagnosis not present

## 2013-12-09 DIAGNOSIS — E119 Type 2 diabetes mellitus without complications: Secondary | ICD-10-CM | POA: Diagnosis not present

## 2014-01-14 ENCOUNTER — Other Ambulatory Visit: Payer: Self-pay | Admitting: Family Medicine

## 2014-02-10 ENCOUNTER — Other Ambulatory Visit: Payer: Self-pay | Admitting: Family Medicine

## 2014-02-10 DIAGNOSIS — E119 Type 2 diabetes mellitus without complications: Secondary | ICD-10-CM | POA: Diagnosis not present

## 2014-02-10 DIAGNOSIS — L608 Other nail disorders: Secondary | ICD-10-CM | POA: Diagnosis not present

## 2014-02-10 NOTE — Telephone Encounter (Signed)
Pt has not been seen in the office in over one year.  Called and spoke to sister Manuela Schwartz.  Appt made for next week.  Refills for one month only.

## 2014-02-11 ENCOUNTER — Other Ambulatory Visit: Payer: Self-pay | Admitting: Family Medicine

## 2014-02-18 ENCOUNTER — Other Ambulatory Visit: Payer: Self-pay | Admitting: Podiatry

## 2014-02-18 DIAGNOSIS — I872 Venous insufficiency (chronic) (peripheral): Secondary | ICD-10-CM

## 2014-02-19 ENCOUNTER — Ambulatory Visit (INDEPENDENT_AMBULATORY_CARE_PROVIDER_SITE_OTHER): Payer: Medicare Other | Admitting: Family Medicine

## 2014-02-19 ENCOUNTER — Encounter: Payer: Self-pay | Admitting: Family Medicine

## 2014-02-19 VITALS — BP 126/72 | HR 84 | Temp 98.2°F | Resp 22 | Ht 76.0 in | Wt 295.0 lb

## 2014-02-19 DIAGNOSIS — E785 Hyperlipidemia, unspecified: Secondary | ICD-10-CM

## 2014-02-19 DIAGNOSIS — Z8673 Personal history of transient ischemic attack (TIA), and cerebral infarction without residual deficits: Secondary | ICD-10-CM | POA: Diagnosis not present

## 2014-02-19 DIAGNOSIS — E119 Type 2 diabetes mellitus without complications: Secondary | ICD-10-CM | POA: Diagnosis not present

## 2014-02-19 DIAGNOSIS — I10 Essential (primary) hypertension: Secondary | ICD-10-CM | POA: Diagnosis not present

## 2014-02-19 LAB — COMPLETE METABOLIC PANEL WITH GFR
ALT: 51 U/L (ref 0–53)
AST: 59 U/L — ABNORMAL HIGH (ref 0–37)
Albumin: 3.7 g/dL (ref 3.5–5.2)
Alkaline Phosphatase: 76 U/L (ref 39–117)
BUN: 17 mg/dL (ref 6–23)
CO2: 25 mEq/L (ref 19–32)
Calcium: 8.8 mg/dL (ref 8.4–10.5)
Chloride: 105 mEq/L (ref 96–112)
Creat: 1.24 mg/dL (ref 0.50–1.35)
GFR, Est African American: 75 mL/min
GFR, Est Non African American: 65 mL/min
Glucose, Bld: 191 mg/dL — ABNORMAL HIGH (ref 70–99)
Potassium: 3.5 mEq/L (ref 3.5–5.3)
Sodium: 143 mEq/L (ref 135–145)
Total Bilirubin: 0.3 mg/dL (ref 0.2–1.2)
Total Protein: 6.1 g/dL (ref 6.0–8.3)

## 2014-02-19 LAB — LIPID PANEL
Cholesterol: 148 mg/dL (ref 0–200)
HDL: 24 mg/dL — ABNORMAL LOW (ref >39)
LDL Cholesterol: 73 mg/dL (ref 0–99)
Total CHOL/HDL Ratio: 6.2 Ratio
Triglycerides: 255 mg/dL — ABNORMAL HIGH (ref <150)
VLDL: 51 mg/dL — ABNORMAL HIGH (ref 0–40)

## 2014-02-19 LAB — HEMOGLOBIN A1C
Hgb A1c MFr Bld: 8.3 % — ABNORMAL HIGH (ref <5.7)
Mean Plasma Glucose: 192 mg/dL — ABNORMAL HIGH (ref <117)

## 2014-02-19 NOTE — Progress Notes (Signed)
Subjective:    Patient ID: Eduardo Young, male    DOB: 10/20/1955, 58 y.o.   MRN: [id number]  HPI Patient is here for followup of his multiple medical problems. He has a history of stroke with right hemiparalysis, hypertension, hyperlipidemia, insulin-dependent diabetes mellitus. He is currently on Lantus 12 units twice a day. He is not checking his sugars. He denies any hypoglycemic episodes. He denies any polyuria, polydipsia, or blurred vision. His blood pressure is well-controlled at 126/72. He denies any chest pain or shortness of breath. Patient is inactive and wheelchair-bound. Therefore he does not get a lot of physical activity. He denies any myalgias right quadrant pain on his statin medication. He is overdue for fasting lab work. He is also overdue for an annual eye exam. He sees a podiatrist for diabetic foot exams. He is currently taking aspirin 81 mg by mouth daily. Past Medical History  Diagnosis Date  . Diabetes mellitus   . Hyperlipidemia   . Hypertension   . Gout   . Sleep apnea, obstructive   . Stroke     [id number]  . GERD (gastroesophageal reflux disease)   . Obesity   . Renal calculi   . Chronic venous insufficiency   . Hemiparesis    No past surgical history on file. Current Outpatient Prescriptions on File Prior to Visit  Medication Sig Dispense Refill  . allopurinol (ZYLOPRIM) 100 MG tablet TAKE ONE TABLET BY MOUTH TWICE DAILY  60 tablet  0  . amLODipine (NORVASC) 10 MG tablet TAKE ONE TABLET BY MOUTH ONCE DAILY  30 tablet  0  . aspirin 81 MG tablet Take 81 mg by mouth every evening.       . bisacodyl (DULCOLAX) 5 MG EC tablet Take 5 mg by mouth daily as needed for constipation.      Marland Kitchen doxazosin (CARDURA) 4 MG tablet TAKE ONE TABLET BY MOUTH ONCE DAILY  30 tablet  0  . insulin glargine (LANTUS) 100 UNIT/ML injection Inject 12 Units into the skin 2 (two) times daily.      Marland Kitchen labetalol (NORMODYNE) 300 MG tablet Take 1 tablet (300 mg total) by mouth 2 (two) times  daily.  60 tablet  0  . lisinopril (PRINIVIL,ZESTRIL) 20 MG tablet TAKE ONE TABLET BY MOUTH TWICE DAILY  60 tablet  0  . metFORMIN (GLUCOPHAGE) 500 MG tablet TAKE ONE TABLET BY MOUTH TWICE DAILY  60 tablet  0  . Multiple Vitamin (MULTIVITAMIN WITH MINERALS) TABS tablet Take 1 tablet by mouth daily.      . simvastatin (ZOCOR) 40 MG tablet TAKE ONE TABLET BY MOUTH AT BEDTIME  30 tablet  0   No current facility-administered medications on file prior to visit.   No Known Allergies History   Social History  . Marital Status: Single    Spouse Name: N/A    Number of Children: N/A  . Years of Education: N/A   Occupational History  . Not on file.   Social History Main Topics  . Smoking status: Former Research scientist (life sciences)  . Smokeless tobacco: Not on file  . Alcohol Use: Yes     Comment: Occasional  . Drug Use: Yes    Special: Cocaine, Marijuana     Comment: Past Hx  . Sexual Activity: Not on file   Other Topics Concern  . Not on file   Social History Narrative  . No narrative on file       Review of Systems  All other systems  reviewed and are negative.      Objective:   Physical Exam  Vitals reviewed. Neck: No JVD present. No thyromegaly present.  Cardiovascular: Normal rate, regular rhythm and normal heart sounds.  Exam reveals no gallop.   No murmur heard. Pulmonary/Chest: Effort normal and breath sounds normal. No respiratory distress. He has no wheezes. He has no rales. He exhibits no tenderness.  Abdominal: Soft. Bowel sounds are normal. He exhibits no distension. There is no tenderness. There is no rebound and no guarding.  Musculoskeletal: He exhibits edema.  Lymphadenopathy:    He has no cervical adenopathy.   chronic venous stasis changes in both legs.        Assessment & Plan:  1. Type II or unspecified type diabetes mellitus without mention of complication, not stated as uncontrolled Check hemoglobin A1c. I recommended the patient see his eye doctor for an annual  eye exam. Have also asked the patient to bring [place] a urine sample when he is able to void and we can check for urine microalbumin. - COMPLETE METABOLIC PANEL WITH GFR - Hemoglobin A1c - Microalbumin, urine  2. Essential hypertension Patient's blood pressures except. On a no changes in his medication. - COMPLETE METABOLIC PANEL WITH GFR  3. HLD (hyperlipidemia) Check fasting lipid panel. Due to his history of stroke, the patient's LDL cholesterol goal is less than 70. - COMPLETE METABOLIC PANEL WITH GFR - Lipid panel  4. History of stroke - Lipid panel

## 2014-03-03 ENCOUNTER — Ambulatory Visit
Admission: RE | Admit: 2014-03-03 | Discharge: 2014-03-03 | Disposition: A | Discharge status: Home / Self Care | Payer: Medicare Other | Source: Ambulatory Visit | Attending: Podiatry | Admitting: Podiatry

## 2014-03-03 ENCOUNTER — Encounter (INDEPENDENT_AMBULATORY_CARE_PROVIDER_SITE_OTHER): Payer: Self-pay

## 2014-03-03 ENCOUNTER — Other Ambulatory Visit: Payer: Self-pay | Admitting: Family Medicine

## 2014-03-03 DIAGNOSIS — M799 Soft tissue disorder, unspecified: Secondary | ICD-10-CM | POA: Diagnosis not present

## 2014-03-03 DIAGNOSIS — L299 Pruritus, unspecified: Secondary | ICD-10-CM | POA: Diagnosis not present

## 2014-03-03 DIAGNOSIS — I872 Venous insufficiency (chronic) (peripheral): Secondary | ICD-10-CM

## 2014-03-03 DIAGNOSIS — R238 Other skin changes: Secondary | ICD-10-CM | POA: Diagnosis not present

## 2014-03-03 MED ORDER — PIOGLITAZONE HCL 30 MG PO TABS: 30.0000 mg | ORAL_TABLET | Freq: Every day | ORAL | Status: DC

## 2014-03-13 ENCOUNTER — Other Ambulatory Visit: Payer: Medicare Other

## 2014-03-13 ENCOUNTER — Other Ambulatory Visit: Payer: Self-pay | Admitting: Family Medicine

## 2014-03-13 DIAGNOSIS — E119 Type 2 diabetes mellitus without complications: Secondary | ICD-10-CM | POA: Diagnosis not present

## 2014-03-14 LAB — MICROALBUMIN, URINE: Microalb, Ur: 59.15 mg/dL — ABNORMAL HIGH (ref 0.00–1.89)

## 2014-03-14 NOTE — Telephone Encounter (Signed)
Refill appropriate and filled per protocol. 

## 2014-04-14 DIAGNOSIS — L608 Other nail disorders: Secondary | ICD-10-CM | POA: Diagnosis not present

## 2014-04-14 DIAGNOSIS — E119 Type 2 diabetes mellitus without complications: Secondary | ICD-10-CM | POA: Diagnosis not present

## 2014-06-23 DIAGNOSIS — E1159 Type 2 diabetes mellitus with other circulatory complications: Secondary | ICD-10-CM | POA: Diagnosis not present

## 2014-06-23 DIAGNOSIS — L602 Onychogryphosis: Secondary | ICD-10-CM | POA: Diagnosis not present

## 2014-06-23 DIAGNOSIS — L97201 Non-pressure chronic ulcer of unspecified calf limited to breakdown of skin: Secondary | ICD-10-CM | POA: Diagnosis not present

## 2014-07-20 ENCOUNTER — Other Ambulatory Visit: Payer: Self-pay | Admitting: Family Medicine

## 2014-09-02 ENCOUNTER — Telehealth: Payer: Self-pay | Admitting: Family Medicine

## 2014-09-02 NOTE — Telephone Encounter (Signed)
[phone]  Eduardo Young the pt sister is calling to see if we could call a z-pack or something in for this pt because he is sick and he is not getting better on any over counter medications.

## 2014-09-02 NOTE — Telephone Encounter (Signed)
Call placed to patient sister, Manuela Schwartz.   Reports that patient has cold and congestion x2 weeks.   Reports that she has been trying OTC medications with no relief.   Advised that patient would need appointment to be seen.   Appointment scheduled.

## 2014-09-03 ENCOUNTER — Encounter: Payer: Self-pay | Admitting: Family Medicine

## 2014-09-03 ENCOUNTER — Ambulatory Visit (INDEPENDENT_AMBULATORY_CARE_PROVIDER_SITE_OTHER): Payer: Medicare Other | Admitting: Family Medicine

## 2014-09-03 VITALS — BP 120/70 | HR 88 | Temp 98.0°F | Resp 22

## 2014-09-03 DIAGNOSIS — B370 Candidal stomatitis: Secondary | ICD-10-CM

## 2014-09-03 DIAGNOSIS — J069 Acute upper respiratory infection, unspecified: Secondary | ICD-10-CM

## 2014-09-03 DIAGNOSIS — H10023 Other mucopurulent conjunctivitis, bilateral: Secondary | ICD-10-CM | POA: Diagnosis not present

## 2014-09-03 NOTE — Progress Notes (Signed)
Subjective:    Patient ID: Eduardo Young, male    DOB: September 08, 1955, 59 y.o.   MRN: [id number]  HPI Patient presents with congestion, sore throat, and nonproductive cough for 4 days. He had body aches and fever for the first 3 days but the fever finally broke. He continues to have a sore throat. He also has bilateral pinkeye with purulent discharge that is white pus. He denies any shortness of breath. He denies any chest pain. The cough is nonproductive. He denies any hemoptysis. Past Medical History  Diagnosis Date  . Diabetes mellitus   . Hyperlipidemia   . Hypertension   . Gout   . Sleep apnea, obstructive   . Stroke     [id number]  . GERD (gastroesophageal reflux disease)   . Obesity   . Renal calculi   . Chronic venous insufficiency   . Hemiparesis    No past surgical history on file. Current Outpatient Prescriptions on File Prior to Visit  Medication Sig Dispense Refill  . allopurinol (ZYLOPRIM) 100 MG tablet Take 1 tablet (100 mg total) by mouth 2 (two) times daily. 60 tablet 6  . amLODipine (NORVASC) 10 MG tablet Take 1 tablet (10 mg total) by mouth daily. 30 tablet 6  . aspirin 81 MG tablet Take 81 mg by mouth every evening.     . bisacodyl (DULCOLAX) 5 MG EC tablet Take 5 mg by mouth daily as needed for constipation.    Marland Kitchen doxazosin (CARDURA) 4 MG tablet Take 1 tablet (4 mg total) by mouth daily. 30 tablet 6  . insulin glargine (LANTUS) 100 UNIT/ML injection Inject 12 Units into the skin 2 (two) times daily.    Marland Kitchen labetalol (NORMODYNE) 300 MG tablet Take 1 tablet (300 mg total) by mouth 2 (two) times daily. 60 tablet 6  . LANTUS 100 UNIT/ML injection INJECT 12 UNITS SUBCUTANEOUSLY TWICE DAILY 10 mL 0  . lisinopril (PRINIVIL,ZESTRIL) 20 MG tablet Take 1 tablet (20 mg total) by mouth 2 (two) times daily. 60 tablet 6  . metFORMIN (GLUCOPHAGE) 500 MG tablet Take 1 tablet (500 mg total) by mouth 2 (two) times daily with a meal. 60 tablet 6  . Multiple Vitamin (MULTIVITAMIN WITH  MINERALS) TABS tablet Take 1 tablet by mouth daily.    . pioglitazone (ACTOS) 30 MG tablet Take 1 tablet (30 mg total) by mouth daily. 30 tablet 3  . simvastatin (ZOCOR) 40 MG tablet Take 1 tablet (40 mg total) by mouth at bedtime. 30 tablet 6   No current facility-administered medications on file prior to visit.   No Known Allergies History   Social History  . Marital Status: Single    Spouse Name: N/A  . Number of Children: N/A  . Years of Education: N/A   Occupational History  . Not on file.   Social History Main Topics  . Smoking status: Former Research scientist (life sciences)  . Smokeless tobacco: Not on file  . Alcohol Use: Yes     Comment: Occasional  . Drug Use: Yes    Special: Cocaine, Marijuana     Comment: Past Hx  . Sexual Activity: Not on file   Other Topics Concern  . Not on file   Social History Narrative      Review of Systems  All other systems reviewed and are negative.      Objective:   Physical Exam  Constitutional: He appears well-developed and well-nourished.  HENT:  Right Ear: External ear normal.  Left Ear: External ear  normal.  Nose: Nose normal.  Mouth/Throat: Oropharyngeal exudate present.  Eyes: Right eye exhibits discharge. Left eye exhibits discharge. Right conjunctiva is injected. Left conjunctiva is injected.  Neck: Neck supple.  Cardiovascular: Normal rate, regular rhythm and normal heart sounds.   Pulmonary/Chest: Effort normal and breath sounds normal. No respiratory distress. He has no wheezes. He has no rales.  Lymphadenopathy:    He has no cervical adenopathy.  Vitals reviewed.         Assessment & Plan:  URI, acute  Pink eye disease of both eyes  Thrush  Patient has a viral upper respiratory infection. However it has been complicated by thrush and pink eye. I will treat the pinkeye with Polytrim drops 2 drops each eye every 6 hours for 7 days. I will treat the thrush with 1 teaspoon of nystatin every 6 hours for 7 days. I did give the  patient prescription for a Z-Pak but I explained to him I did not want him to fill the prescription unless his symptoms worsen dramatically.

## 2014-09-23 ENCOUNTER — Other Ambulatory Visit: Payer: Self-pay | Admitting: Family Medicine

## 2014-09-23 DIAGNOSIS — L602 Onychogryphosis: Secondary | ICD-10-CM | POA: Diagnosis not present

## 2014-09-23 DIAGNOSIS — E1151 Type 2 diabetes mellitus with diabetic peripheral angiopathy without gangrene: Secondary | ICD-10-CM | POA: Diagnosis not present

## 2014-09-24 NOTE — Telephone Encounter (Signed)
Refill appropriate and filled per protocol. 

## 2014-10-29 ENCOUNTER — Other Ambulatory Visit: Payer: Self-pay | Admitting: *Deleted

## 2014-10-29 MED ORDER — ALLOPURINOL 100 MG PO TABS: 100.0000 mg | ORAL_TABLET | Freq: Two times a day (BID) | ORAL | Status: DC

## 2014-10-29 MED ORDER — LABETALOL HCL 300 MG PO TABS: 300.0000 mg | ORAL_TABLET | Freq: Two times a day (BID) | ORAL | Status: DC

## 2014-10-29 MED ORDER — METFORMIN HCL 500 MG PO TABS: 500.0000 mg | ORAL_TABLET | Freq: Two times a day (BID) | ORAL | Status: DC

## 2014-10-29 MED ORDER — LISINOPRIL 20 MG PO TABS: 20.0000 mg | ORAL_TABLET | Freq: Two times a day (BID) | ORAL | Status: DC

## 2014-10-29 MED ORDER — SIMVASTATIN 40 MG PO TABS
40.0000 mg | ORAL_TABLET | Freq: Every day | ORAL | Status: DC
Start: 2014-10-29 — End: 2015-06-14

## 2014-10-29 MED ORDER — AMLODIPINE BESYLATE 10 MG PO TABS: 10.0000 mg | ORAL_TABLET | Freq: Every day | ORAL | Status: DC

## 2014-10-29 MED ORDER — PIOGLITAZONE HCL 30 MG PO TABS: 30.0000 mg | ORAL_TABLET | Freq: Every day | ORAL | Status: DC

## 2014-10-29 MED ORDER — DOXAZOSIN MESYLATE 4 MG PO TABS: 4.0000 mg | ORAL_TABLET | Freq: Every day | ORAL | Status: DC

## 2014-10-29 NOTE — Telephone Encounter (Signed)
Received call from patient sister, Manuela Schwartz.   Requested refill on medications.   Prescription sent to pharmacy.

## 2014-11-25 DIAGNOSIS — E1151 Type 2 diabetes mellitus with diabetic peripheral angiopathy without gangrene: Secondary | ICD-10-CM | POA: Diagnosis not present

## 2014-11-25 DIAGNOSIS — L602 Onychogryphosis: Secondary | ICD-10-CM | POA: Diagnosis not present

## 2014-12-10 ENCOUNTER — Encounter: Payer: Self-pay | Admitting: Family Medicine

## 2014-12-10 ENCOUNTER — Ambulatory Visit (INDEPENDENT_AMBULATORY_CARE_PROVIDER_SITE_OTHER): Payer: Medicare Other | Admitting: Family Medicine

## 2014-12-10 VITALS — BP 136/68 | HR 80 | Temp 98.5°F | Resp 24

## 2014-12-10 DIAGNOSIS — I10 Essential (primary) hypertension: Secondary | ICD-10-CM

## 2014-12-10 DIAGNOSIS — L97529 Non-pressure chronic ulcer of other part of left foot with unspecified severity: Secondary | ICD-10-CM

## 2014-12-10 MED ORDER — SILVER SULFADIAZINE 1 % EX CREA: 1.0000 "application " | TOPICAL_CREAM | Freq: Every day | CUTANEOUS | Status: DC

## 2014-12-10 MED ORDER — HYDROCHLOROTHIAZIDE 25 MG PO TABS: 25.0000 mg | ORAL_TABLET | Freq: Every day | ORAL | Status: DC

## 2014-12-10 NOTE — Progress Notes (Signed)
Subjective:    Patient ID: Eduardo Young, male    DOB: [birth year], 59 y.o.   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  He subsequently has developed dependent edema in his right leg and right arm.  Because of this he has a history of venous stasis ulcers in the right leg. Recently he developed a blister/weeping ulcer on the dorsum of the right first MP joint. Earlier this year I started the patient on Actos as an insulin sensitizer. This is contributed more to his edema. Now he is developed a weeping ulcer on the dorsum of his left foot at the MTP joints.  Ulcer on the left foot is 8 cm x 3 cm. Ulcer on the first MP joint is 2 cm x 3 cm. Past Medical History  Diagnosis Date  . Diabetes mellitus   . Hyperlipidemia   . Hypertension   . Gout   . Sleep apnea, obstructive   . Stroke     [id number]  . GERD (gastroesophageal reflux disease)   . Obesity   . Renal calculi   . Chronic venous insufficiency   . Hemiparesis    No past surgical history on file. Current Outpatient Prescriptions on File Prior to Visit  Medication Sig Dispense Refill  . allopurinol (ZYLOPRIM) 100 MG tablet Take 1 tablet (100 mg total) by mouth 2 (two) times daily. 60 tablet 6  . amLODipine (NORVASC) 10 MG tablet Take 1 tablet (10 mg total) by mouth daily. 30 tablet 6  . aspirin 81 MG tablet Take 81 mg by mouth every evening.     . bisacodyl (DULCOLAX) 5 MG EC tablet Take 5 mg by mouth daily as needed for constipation.    Marland Kitchen doxazosin (CARDURA) 4 MG tablet Take 1 tablet (4 mg total) by mouth daily. 30 tablet 6  . insulin glargine (LANTUS) 100 UNIT/ML injection Inject 0.12 mLs (12 Units total) into the skin 2 (two) times daily. 10 vial 3  . labetalol (NORMODYNE) 300 MG tablet Take 1 tablet (300 mg total) by mouth 2 (two) times daily. 60 tablet 6  . lisinopril (PRINIVIL,ZESTRIL) 20 MG tablet Take 1 tablet (20 mg total) by mouth 2 (two) times daily. 60 tablet 6  . metFORMIN (GLUCOPHAGE) 500 MG  tablet Take 1 tablet (500 mg total) by mouth 2 (two) times daily with a meal. 60 tablet 6  . Multiple Vitamin (MULTIVITAMIN WITH MINERALS) TABS tablet Take 1 tablet by mouth daily.    . pioglitazone (ACTOS) 30 MG tablet Take 1 tablet (30 mg total) by mouth daily. 30 tablet 3  . simvastatin (ZOCOR) 40 MG tablet Take 1 tablet (40 mg total) by mouth at bedtime. 30 tablet 6   No current facility-administered medications on file prior to visit.   No Known Allergies History   Social History  . Marital Status: Single    Spouse Name: N/A  . Number of Children: N/A  . Years of Education: N/A   Occupational History  . Not on file.   Social History Main Topics  . Smoking status: Former Research scientist (life sciences)  . Smokeless tobacco: Not on file  . Alcohol Use: Yes     Comment: Occasional  . Drug Use: Yes    Special: Cocaine, Marijuana     Comment: Past Hx  . Sexual Activity: Not on file   Other Topics Concern  . Not on file   Social History Narrative      Review of Systems  All other systems reviewed and are negative.      Objective:   Physical Exam  Cardiovascular: Normal rate, regular rhythm and normal heart sounds.   Pulmonary/Chest: Effort normal and breath sounds normal.  Skin: Rash noted. There is erythema.  Vitals reviewed.         Assessment & Plan:  Ulcer of left foot - Plan: silver sulfADIAZINE (SILVADENE) 1 % cream  Benign essential HTN - Plan: hydrochlorothiazide (HYDRODIURIL) 25 MG tablet  All the patient's ulcers are due to dependent edema and swelling. If we can focus on managing the swelling, I believe his ulcers from forming. Therefore I will have the patient discontinue amlodipine and discontinued Actos.  I will replace amlodipine with hydrochlorothiazide 25 mg a day to both address his blood pressure and help manage the swelling. I will recheck his blood pressure and renal function next week. I recommended the patient wear compression stockings on his legs at all  times. I recommended he elevate his right arm as well as his legs is much as possible. I will treat disorders on his hand and feet similar to a burn. Cover them with Silvadene cream, nonadherent gauze, and Coban and on a daily basis. Recheck in one week

## 2014-12-24 ENCOUNTER — Ambulatory Visit (INDEPENDENT_AMBULATORY_CARE_PROVIDER_SITE_OTHER): Payer: Medicare Other | Admitting: Family Medicine

## 2014-12-24 ENCOUNTER — Encounter: Payer: Self-pay | Admitting: Family Medicine

## 2014-12-24 VITALS — BP 148/86 | HR 84 | Temp 98.7°F | Resp 22

## 2014-12-24 DIAGNOSIS — Z794 Long term (current) use of insulin: Secondary | ICD-10-CM

## 2014-12-24 DIAGNOSIS — IMO0001 Reserved for inherently not codable concepts without codable children: Secondary | ICD-10-CM

## 2014-12-24 DIAGNOSIS — L97529 Non-pressure chronic ulcer of other part of left foot with unspecified severity: Secondary | ICD-10-CM

## 2014-12-24 DIAGNOSIS — E119 Type 2 diabetes mellitus without complications: Secondary | ICD-10-CM

## 2014-12-24 DIAGNOSIS — I10 Essential (primary) hypertension: Secondary | ICD-10-CM

## 2014-12-24 NOTE — Progress Notes (Signed)
Subjective:    Patient ID: Eduardo Young, male    DOB: Jun 22, 1956, 59 y.o.   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  He subsequently has developed dependent edema in his right leg and right arm.  Because of this he has a history of venous stasis ulcers in the right leg. Recently he developed a blister/weeping ulcer on the dorsum of the right first MP joint. Earlier this year I started the patient on Actos as an insulin sensitizer. This is contributed more to his edema. Now he is developed a weeping ulcer on the dorsum of his left foot at the MTP joints.  Ulcer on the left foot is 8 cm x 3 cm. Ulcer on the first MP joint is 2 cm x 3 cm.  At that time, my plan was: All the patient's ulcers are due to dependent edema and swelling. If we can focus on managing the swelling, I believe we can prevent his ulcers from forming. Therefore I will have the patient discontinue amlodipine and discontinue Actos.  I will replace amlodipine with hydrochlorothiazide 25 mg a day to both address his blood pressure and help manage the swelling. I will recheck his blood pressure and renal function next week. I recommended the patient wear compression stockings on his legs at all times. I recommended he elevate his right arm as well as his legs is much as possible. I will treat the ulcers on his hand and feet similar to a burn. Cover them with Silvadene cream, nonadherent gauze, and Coban and on a daily basis. Recheck in one week.  12/24/14 Patient is here today for recheck.  The ulcer on left foot has completely healed. The sore and wound on the right hand has completely healed. The swelling in both legs and in both arms is much improved with the medication changes we made last time. Unfortunately his blood pressure slightly elevated at 148/86. He is not checking his blood pressure at home. He is not checking his blood sugar at home. He is overdue for fasting lab work  Past Medical History    Diagnosis Date  . Diabetes mellitus   . Hyperlipidemia   . Hypertension   . Gout   . Sleep apnea, obstructive   . Stroke     [id number]  . GERD (gastroesophageal reflux disease)   . Obesity   . Renal calculi   . Chronic venous insufficiency   . Hemiparesis    No past surgical history on file. Current Outpatient Prescriptions on File Prior to Visit  Medication Sig Dispense Refill  . allopurinol (ZYLOPRIM) 100 MG tablet Take 1 tablet (100 mg total) by mouth 2 (two) times daily. 60 tablet 6  . amLODipine (NORVASC) 10 MG tablet Take 1 tablet (10 mg total) by mouth daily. 30 tablet 6  . aspirin 81 MG tablet Take 81 mg by mouth every evening.     . bisacodyl (DULCOLAX) 5 MG EC tablet Take 5 mg by mouth daily as needed for constipation.    Marland Kitchen doxazosin (CARDURA) 4 MG tablet Take 1 tablet (4 mg total) by mouth daily. 30 tablet 6  . hydrochlorothiazide (HYDRODIURIL) 25 MG tablet Take 1 tablet (25 mg total) by mouth daily. 90 tablet 3  . insulin glargine (LANTUS) 100 UNIT/ML injection Inject 0.12 mLs (12 Units total) into the skin 2 (two) times daily. 10 vial 3  . labetalol (NORMODYNE) 300 MG tablet Take 1 tablet (300 mg total)  by mouth 2 (two) times daily. 60 tablet 6  . lisinopril (PRINIVIL,ZESTRIL) 20 MG tablet Take 1 tablet (20 mg total) by mouth 2 (two) times daily. 60 tablet 6  . metFORMIN (GLUCOPHAGE) 500 MG tablet Take 1 tablet (500 mg total) by mouth 2 (two) times daily with a meal. 60 tablet 6  . Multiple Vitamin (MULTIVITAMIN WITH MINERALS) TABS tablet Take 1 tablet by mouth daily.    . pioglitazone (ACTOS) 30 MG tablet Take 1 tablet (30 mg total) by mouth daily. 30 tablet 3  . silver sulfADIAZINE (SILVADENE) 1 % cream Apply 1 application topically daily. 50 g 0  . simvastatin (ZOCOR) 40 MG tablet Take 1 tablet (40 mg total) by mouth at bedtime. 30 tablet 6   No current facility-administered medications on file prior to visit.   No Known Allergies History   Social History  .  Marital Status: Single    Spouse Name: N/A  . Number of Children: N/A  . Years of Education: N/A   Occupational History  . Not on file.   Social History Main Topics  . Smoking status: Former Research scientist (life sciences)  . Smokeless tobacco: Not on file  . Alcohol Use: Yes     Comment: Occasional  . Drug Use: Yes    Special: Cocaine, Marijuana     Comment: Past Hx  . Sexual Activity: Not on file   Other Topics Concern  . Not on file   Social History Narrative      Review of Systems  All other systems reviewed and are negative.      Objective:   Physical Exam  Cardiovascular: Normal rate, regular rhythm and normal heart sounds.   Pulmonary/Chest: Effort normal and breath sounds normal.  Skin: No rash noted. No erythema.  Vitals reviewed.         Assessment & Plan:  Benign essential HTN  IDDM (insulin dependent diabetes mellitus) - Plan: CBC with Differential/Platelet, COMPLETE METABOLIC PANEL WITH GFR, Lipid panel, Hemoglobin A1c  Ulcer of left foot  It is apparent to me that the blisters were coming from chronic venous insufficiency and peripheral edema. This is much better since we made the medication changes last time. However we now need to make a focus controlling his blood pressure and controlling his blood sugar. I have asked his sister to check his blood pressure everyday for the next week and report the values to me. I would also like to check fasting blood sugars and two-hour postprandial sugars. I will get hemoglobin A1c today as well as a fasting lipid panel. I suspect that we will start the patient on Invokana 300 mg poqday to help address the swelling, possibly lower his blood pressure, and also help address his diabetes as long as his kidney function can tolerate it

## 2014-12-25 LAB — COMPLETE METABOLIC PANEL WITH GFR
ALT: 27 U/L (ref 0–53)
AST: 21 U/L (ref 0–37)
Albumin: 3.7 g/dL (ref 3.5–5.2)
Alkaline Phosphatase: 80 U/L (ref 39–117)
BUN: 25 mg/dL — ABNORMAL HIGH (ref 6–23)
CO2: 27 mEq/L (ref 19–32)
Calcium: 8.5 mg/dL (ref 8.4–10.5)
Chloride: 101 mEq/L (ref 96–112)
Creat: 1.76 mg/dL — ABNORMAL HIGH (ref 0.50–1.35)
GFR, Est African American: 49 mL/min — ABNORMAL LOW
GFR, Est Non African American: 42 mL/min — ABNORMAL LOW
Glucose, Bld: 125 mg/dL — ABNORMAL HIGH (ref 70–99)
Potassium: 3.2 mEq/L — ABNORMAL LOW (ref 3.5–5.3)
Sodium: 142 mEq/L (ref 135–145)
Total Bilirubin: 0.4 mg/dL (ref 0.2–1.2)
Total Protein: 6.3 g/dL (ref 6.0–8.3)

## 2014-12-25 LAB — CBC WITH DIFFERENTIAL/PLATELET
Basophils Absolute: 0 10*3/uL (ref 0.0–0.1)
Basophils Relative: 0 % (ref 0–1)
Eosinophils Absolute: 0.7 10*3/uL (ref 0.0–0.7)
Eosinophils Relative: 7 % — ABNORMAL HIGH (ref 0–5)
HCT: 39.5 % (ref 39.0–52.0)
Hemoglobin: 12.7 g/dL — ABNORMAL LOW (ref 13.0–17.0)
Lymphocytes Relative: 22 % (ref 12–46)
Lymphs Abs: 2.1 10*3/uL (ref 0.7–4.0)
MCH: 28.3 pg (ref 26.0–34.0)
MCHC: 32.2 g/dL (ref 30.0–36.0)
MCV: 88 fL (ref 78.0–100.0)
MPV: 9.8 fL (ref 8.6–12.4)
Monocytes Absolute: 0.5 10*3/uL (ref 0.1–1.0)
Monocytes Relative: 5 % (ref 3–12)
Neutro Abs: 6.3 10*3/uL (ref 1.7–7.7)
Neutrophils Relative %: 66 % (ref 43–77)
Platelets: 238 10*3/uL (ref 150–400)
RBC: 4.49 MIL/uL (ref 4.22–5.81)
RDW: 15.6 % — ABNORMAL HIGH (ref 11.5–15.5)
WBC: 9.6 10*3/uL (ref 4.0–10.5)

## 2014-12-25 LAB — LIPID PANEL
Cholesterol: 132 mg/dL (ref 0–200)
HDL: 20 mg/dL — ABNORMAL LOW (ref >=40)
LDL Cholesterol: 58 mg/dL (ref 0–99)
Total CHOL/HDL Ratio: 6.6 Ratio
Triglycerides: 271 mg/dL — ABNORMAL HIGH (ref <150)
VLDL: 54 mg/dL — ABNORMAL HIGH (ref 0–40)

## 2014-12-25 LAB — HEMOGLOBIN A1C
Hgb A1c MFr Bld: 6.8 % — ABNORMAL HIGH (ref <5.7)
Mean Plasma Glucose: 148 mg/dL — ABNORMAL HIGH (ref <117)

## 2015-02-10 DIAGNOSIS — E1151 Type 2 diabetes mellitus with diabetic peripheral angiopathy without gangrene: Secondary | ICD-10-CM | POA: Diagnosis not present

## 2015-02-10 DIAGNOSIS — L602 Onychogryphosis: Secondary | ICD-10-CM | POA: Diagnosis not present

## 2015-02-22 ENCOUNTER — Telehealth: Payer: Self-pay | Admitting: Family Medicine

## 2015-02-22 NOTE — Telephone Encounter (Signed)
Sister called in states that she has been recently dx with ovarian cancer and she was supposed to be monitoring patient's blood sugar and blood pressure. She hasn't been able to monitor that as requested and would like to discuss with you.

## 2015-02-22 NOTE — Telephone Encounter (Signed)
Pt's FBS are running 130-140 and his BP's are 140-130/80-70. She has had a hysterectomy and is unable to bring him in for f/u BW but should be able to get him in here in the next couple of weeks. He is off the actos and amlodipine.

## 2015-02-23 NOTE — Telephone Encounter (Signed)
Eduardo Young aware via vm

## 2015-02-23 NOTE — Telephone Encounter (Signed)
These sound good.  No changes in meds at this time.

## 2015-04-15 DIAGNOSIS — M79672 Pain in left foot: Secondary | ICD-10-CM | POA: Diagnosis not present

## 2015-04-15 DIAGNOSIS — E1151 Type 2 diabetes mellitus with diabetic peripheral angiopathy without gangrene: Secondary | ICD-10-CM | POA: Diagnosis not present

## 2015-04-15 DIAGNOSIS — L138 Other specified bullous disorders: Secondary | ICD-10-CM | POA: Diagnosis not present

## 2015-04-19 DIAGNOSIS — E119 Type 2 diabetes mellitus without complications: Secondary | ICD-10-CM | POA: Diagnosis not present

## 2015-04-19 DIAGNOSIS — L03116 Cellulitis of left lower limb: Secondary | ICD-10-CM | POA: Diagnosis not present

## 2015-04-19 DIAGNOSIS — E1151 Type 2 diabetes mellitus with diabetic peripheral angiopathy without gangrene: Secondary | ICD-10-CM | POA: Diagnosis not present

## 2015-04-19 DIAGNOSIS — M79672 Pain in left foot: Secondary | ICD-10-CM | POA: Diagnosis not present

## 2015-04-19 DIAGNOSIS — L84 Corns and callosities: Secondary | ICD-10-CM | POA: Diagnosis not present

## 2015-04-19 DIAGNOSIS — L602 Onychogryphosis: Secondary | ICD-10-CM | POA: Diagnosis not present

## 2015-04-19 DIAGNOSIS — L138 Other specified bullous disorders: Secondary | ICD-10-CM | POA: Diagnosis not present

## 2015-04-27 DIAGNOSIS — L03116 Cellulitis of left lower limb: Secondary | ICD-10-CM | POA: Diagnosis not present

## 2015-05-12 DIAGNOSIS — I639 Cerebral infarction, unspecified: Secondary | ICD-10-CM | POA: Diagnosis not present

## 2015-05-12 DIAGNOSIS — H31003 Unspecified chorioretinal scars, bilateral: Secondary | ICD-10-CM | POA: Diagnosis not present

## 2015-05-12 DIAGNOSIS — E109 Type 1 diabetes mellitus without complications: Secondary | ICD-10-CM | POA: Diagnosis not present

## 2015-06-09 ENCOUNTER — Other Ambulatory Visit: Payer: Self-pay | Admitting: Family Medicine

## 2015-06-10 ENCOUNTER — Encounter: Payer: Self-pay | Admitting: Family Medicine

## 2015-06-14 ENCOUNTER — Other Ambulatory Visit: Payer: Self-pay | Admitting: Family Medicine

## 2015-06-14 NOTE — Telephone Encounter (Signed)
Refill appropriate and filled per protocol. 

## 2015-06-29 ENCOUNTER — Other Ambulatory Visit: Payer: Medicare Other

## 2015-06-29 ENCOUNTER — Other Ambulatory Visit: Payer: Self-pay | Admitting: Family Medicine

## 2015-06-29 DIAGNOSIS — E785 Hyperlipidemia, unspecified: Secondary | ICD-10-CM

## 2015-06-29 DIAGNOSIS — I10 Essential (primary) hypertension: Secondary | ICD-10-CM

## 2015-06-29 DIAGNOSIS — E119 Type 2 diabetes mellitus without complications: Secondary | ICD-10-CM

## 2015-06-29 DIAGNOSIS — Z79899 Other long term (current) drug therapy: Secondary | ICD-10-CM

## 2015-06-29 LAB — CBC WITH DIFFERENTIAL/PLATELET
Basophils Absolute: 0 10*3/uL (ref 0.0–0.1)
Basophils Relative: 0 % (ref 0–1)
Eosinophils Absolute: 0.9 10*3/uL — ABNORMAL HIGH (ref 0.0–0.7)
Eosinophils Relative: 9 % — ABNORMAL HIGH (ref 0–5)
HCT: 39.4 % (ref 39.0–52.0)
Hemoglobin: 13 g/dL (ref 13.0–17.0)
Lymphocytes Relative: 21 % (ref 12–46)
Lymphs Abs: 2.1 10*3/uL (ref 0.7–4.0)
MCH: 28.4 pg (ref 26.0–34.0)
MCHC: 33 g/dL (ref 30.0–36.0)
MCV: 86 fL (ref 78.0–100.0)
MPV: 9.8 fL (ref 8.6–12.4)
Monocytes Absolute: 0.4 10*3/uL (ref 0.1–1.0)
Monocytes Relative: 4 % (ref 3–12)
Neutro Abs: 6.6 10*3/uL (ref 1.7–7.7)
Neutrophils Relative %: 66 % (ref 43–77)
Platelets: 219 10*3/uL (ref 150–400)
RBC: 4.58 MIL/uL (ref 4.22–5.81)
RDW: 15.4 % (ref 11.5–15.5)
WBC: 10 10*3/uL (ref 4.0–10.5)

## 2015-06-29 LAB — COMPLETE METABOLIC PANEL WITH GFR
ALT: 36 U/L (ref 9–46)
AST: 28 U/L (ref 10–35)
Albumin: 3.6 g/dL (ref 3.6–5.1)
Alkaline Phosphatase: 82 U/L (ref 40–115)
BUN: 17 mg/dL (ref 7–25)
CO2: 29 mmol/L (ref 20–31)
Calcium: 8.3 mg/dL — ABNORMAL LOW (ref 8.6–10.3)
Chloride: 103 mmol/L (ref 98–110)
Creat: 1.42 mg/dL — ABNORMAL HIGH (ref 0.70–1.33)
GFR, Est African American: 63 mL/min (ref >=60)
GFR, Est Non African American: 54 mL/min — ABNORMAL LOW (ref >=60)
Glucose, Bld: 191 mg/dL — ABNORMAL HIGH (ref 70–99)
Potassium: 3.2 mmol/L — ABNORMAL LOW (ref 3.5–5.3)
Sodium: 142 mmol/L (ref 135–146)
Total Bilirubin: 0.4 mg/dL (ref 0.2–1.2)
Total Protein: 6 g/dL — ABNORMAL LOW (ref 6.1–8.1)

## 2015-06-29 LAB — LIPID PANEL
Cholesterol: 136 mg/dL (ref 125–200)
HDL: 19 mg/dL — ABNORMAL LOW (ref >=40)
LDL Cholesterol: 61 mg/dL (ref <130)
Total CHOL/HDL Ratio: 7.2 Ratio — ABNORMAL HIGH (ref <=5.0)
Triglycerides: 282 mg/dL — ABNORMAL HIGH (ref <150)
VLDL: 56 mg/dL — ABNORMAL HIGH (ref <30)

## 2015-06-29 LAB — TSH: TSH: 3.659 u[IU]/mL (ref 0.350–4.500)

## 2015-06-30 DIAGNOSIS — E1351 Other specified diabetes mellitus with diabetic peripheral angiopathy without gangrene: Secondary | ICD-10-CM | POA: Diagnosis not present

## 2015-06-30 DIAGNOSIS — L602 Onychogryphosis: Secondary | ICD-10-CM | POA: Diagnosis not present

## 2015-06-30 LAB — MICROALBUMIN / CREATININE URINE RATIO
Creatinine, Urine: 117 mg/dL (ref 20–370)
Microalb Creat Ratio: 334 mcg/mg creat — ABNORMAL HIGH (ref <30)
Microalb, Ur: 39.1 mg/dL

## 2015-06-30 LAB — HEMOGLOBIN A1C
Hgb A1c MFr Bld: 8.5 % — ABNORMAL HIGH (ref <5.7)
Mean Plasma Glucose: 197 mg/dL — ABNORMAL HIGH (ref <117)

## 2015-07-07 ENCOUNTER — Other Ambulatory Visit: Payer: Self-pay | Admitting: Family Medicine

## 2015-07-08 ENCOUNTER — Encounter: Payer: Self-pay | Admitting: Family Medicine

## 2015-07-08 ENCOUNTER — Ambulatory Visit (INDEPENDENT_AMBULATORY_CARE_PROVIDER_SITE_OTHER): Payer: Medicare Other | Admitting: Family Medicine

## 2015-07-08 VITALS — BP 178/98 | HR 80 | Temp 98.7°F | Resp 18

## 2015-07-08 DIAGNOSIS — E119 Type 2 diabetes mellitus without complications: Secondary | ICD-10-CM | POA: Diagnosis not present

## 2015-07-08 DIAGNOSIS — IMO0001 Reserved for inherently not codable concepts without codable children: Secondary | ICD-10-CM

## 2015-07-08 DIAGNOSIS — I10 Essential (primary) hypertension: Secondary | ICD-10-CM | POA: Diagnosis not present

## 2015-07-08 DIAGNOSIS — Z794 Long term (current) use of insulin: Secondary | ICD-10-CM

## 2015-07-08 DIAGNOSIS — E785 Hyperlipidemia, unspecified: Secondary | ICD-10-CM

## 2015-07-08 DIAGNOSIS — Z23 Encounter for immunization: Secondary | ICD-10-CM | POA: Diagnosis not present

## 2015-07-08 DIAGNOSIS — Z8673 Personal history of transient ischemic attack (TIA), and cerebral infarction without residual deficits: Secondary | ICD-10-CM

## 2015-07-08 MED ORDER — HYDRALAZINE HCL 25 MG PO TABS: 25.0000 mg | ORAL_TABLET | Freq: Three times a day (TID) | ORAL | Status: DC

## 2015-07-08 NOTE — Progress Notes (Signed)
Subjective:    Patient ID: Eduardo Young, male    DOB: Jun 06, 1956, 59 y.o.   MRN: [id number]  HPI 12/10/14 Has history of right hemiparesis due to CVA.  He subsequently has developed dependent edema in his right leg and right arm.  Because of this he has a history of venous stasis ulcers in the right leg. Recently he developed a blister/weeping ulcer on the dorsum of the right first MP joint. Earlier this year I started the patient on Actos as an insulin sensitizer. This is contributed more to his edema. Now he is developed a weeping ulcer on the dorsum of his left foot at the MTP joints.  Ulcer on the left foot is 8 cm x 3 cm. Ulcer on the first MP joint is 2 cm x 3 cm.  At that time, my plan was: All the patient's ulcers are due to dependent edema and swelling. If we can focus on managing the swelling, I believe we can prevent his ulcers from forming. Therefore I will have the patient discontinue amlodipine and discontinue Actos.  I will replace amlodipine with hydrochlorothiazide 25 mg a day to both address his blood pressure and help manage the swelling. I will recheck his blood pressure and renal function next week. I recommended the patient wear compression stockings on his legs at all times. I recommended he elevate his right arm as well as his legs is much as possible. I will treat the ulcers on his hand and feet similar to a burn. Cover them with Silvadene cream, nonadherent gauze, and Coban and on a daily basis. Recheck in one week.  12/24/14 Patient is here today for recheck.  The ulcer on left foot has completely healed. The sore and wound on the right hand has completely healed. The swelling in both legs and in both arms is much improved with the medication changes we made last time. Unfortunately his blood pressure slightly elevated at 148/86. He is not checking his blood pressure at home. He is not checking his blood sugar at home. He is overdue for fasting lab work.  At that time, my plan  was: It is apparent to me that the blisters were coming from chronic venous insufficiency and peripheral edema. This is much better since we made the medication changes last time. However we now need to make a focus controlling his blood pressure and controlling his blood sugar. I have asked his sister to check his blood pressure everyday for the next week and report the values to me. I would also like to check fasting blood sugars and two-hour postprandial sugars. I will get hemoglobin A1c today as well as a fasting lipid panel. I suspect that we will start the patient on Invokana 300 mg poqday to help address the swelling, possibly lower his blood pressure, and also help address his diabetes as long as his kidney function can tolerate it.  07/08/15 Most recent LABS are listed below.  Never added invokana as HgA1c was 6.8 at last ov. No visits with results within 1 Week(s) from this visit. Latest known visit with results is:  Appointment on 06/29/2015  Component Date Value Ref Range Status  . Creatinine, Urine 06/29/2015 117  20 - 370 mg/dL Final  . Microalb, Ur 06/29/2015 39.1  Not estab mg/dL Final   Comment: Result confirmed by automatic dilution. Result repeated and verified.   . Microalb Creat Ratio 06/29/2015 334* <30 mcg/mg creat Final   Comment: The ADA has defined  abnormalities in albumin excretion as follows:           Category           Result                            (mcg/mg creatinine)                 Normal:    <30       Microalbuminuria:    30 - 299   Clinical albuminuria:    > or = 300   The ADA recommends that at least two of three specimens collected within a 3 - 6 month period be abnormal before considering a patient to be within a diagnostic category.     . Hgb A1c MFr Bld 06/29/2015 8.5* <5.7 % Final   Comment:                                                                        According to the ADA Clinical Practice Recommendations for 2011, when HbA1c is  used as a screening test:     >=6.5%   Diagnostic of Diabetes Mellitus            (if abnormal result is confirmed)   5.7-6.4%   Increased risk of developing Diabetes Mellitus   References:Diagnosis and Classification of Diabetes Mellitus,Diabetes WUJW,1191,47(WGNFA 1):S62-S69 and Standards of Medical Care in         Diabetes - 2011,Diabetes OZHY,8657,84 (Suppl 1):S11-S61.     . Mean Plasma Glucose 06/29/2015 197* <117 mg/dL Final  . WBC 06/29/2015 10.0  4.0 - 10.5 K/uL Final  . RBC 06/29/2015 4.58  4.22 - 5.81 MIL/uL Final  . Hemoglobin 06/29/2015 13.0  13.0 - 17.0 g/dL Final  . HCT 06/29/2015 39.4  39.0 - 52.0 % Final  . MCV 06/29/2015 86.0  78.0 - 100.0 fL Final  . MCH 06/29/2015 28.4  26.0 - 34.0 pg Final  . MCHC 06/29/2015 33.0  30.0 - 36.0 g/dL Final  . RDW 06/29/2015 15.4  11.5 - 15.5 % Final  . Platelets 06/29/2015 219  150 - 400 K/uL Final  . MPV 06/29/2015 9.8  8.6 - 12.4 fL Final  . Neutrophils Relative % 06/29/2015 66  43 - 77 % Final  . Neutro Abs 06/29/2015 6.6  1.7 - 7.7 K/uL Final  . Lymphocytes Relative 06/29/2015 21  12 - 46 % Final  . Lymphs Abs 06/29/2015 2.1  0.7 - 4.0 K/uL Final  . Monocytes Relative 06/29/2015 4  3 - 12 % Final  . Monocytes Absolute 06/29/2015 0.4  0.1 - 1.0 K/uL Final  . Eosinophils Relative 06/29/2015 9* 0 - 5 % Final  . Eosinophils Absolute 06/29/2015 0.9* 0.0 - 0.7 K/uL Final  . Basophils Relative 06/29/2015 0  0 - 1 % Final  . Basophils Absolute 06/29/2015 0.0  0.0 - 0.1 K/uL Final  . Smear Review 06/29/2015 Criteria for review not met   Final  . Cholesterol 06/29/2015 136  125 - 200 mg/dL Final  . Triglycerides 06/29/2015 282* <150 mg/dL Final  . HDL 06/29/2015 19* >=40 mg/dL Final  . Total CHOL/HDL Ratio 06/29/2015 7.2* <=5.0 Ratio Final  .  VLDL 06/29/2015 56* <30 mg/dL Final  . LDL Cholesterol 06/29/2015 61  <130 mg/dL Final   Comment:   Total Cholesterol/HDL Ratio:CHD Risk                        Coronary Heart Disease Risk  Table                                        Men       Women          1/2 Average Risk              3.4        3.3              Average Risk              5.0        4.4           2X Average Risk              9.6        7.1           3X Average Risk             23.4       11.0 Use the calculated Patient Ratio above and the CHD Risk table  to determine the patient's CHD Risk.   Marland Kitchen TSH 06/29/2015 3.659  0.350 - 4.500 uIU/mL Final  . Sodium 06/29/2015 142  135 - 146 mmol/L Final  . Potassium 06/29/2015 3.2* 3.5 - 5.3 mmol/L Final  . Chloride 06/29/2015 103  98 - 110 mmol/L Final  . CO2 06/29/2015 29  20 - 31 mmol/L Final  . Glucose, Bld 06/29/2015 191* 70 - 99 mg/dL Final  . BUN 06/29/2015 17  7 - 25 mg/dL Final  . Creat 06/29/2015 1.42* 0.70 - 1.33 mg/dL Final  . Total Bilirubin 06/29/2015 0.4  0.2 - 1.2 mg/dL Final  . Alkaline Phosphatase 06/29/2015 82  40 - 115 U/L Final  . AST 06/29/2015 28  10 - 35 U/L Final  . ALT 06/29/2015 36  9 - 46 U/L Final  . Total Protein 06/29/2015 6.0* 6.1 - 8.1 g/dL Final  . Albumin 06/29/2015 3.6  3.6 - 5.1 g/dL Final  . Calcium 06/29/2015 8.3* 8.6 - 10.3 mg/dL Final  . GFR, Est African American 06/29/2015 63  >=60 mL/min Final  . GFR, Est Non African American 06/29/2015 54* >=60 mL/min Final   Comment:   The estimated GFR is a calculation valid for adults (>=11 years old) that uses the CKD-EPI algorithm to adjust for age and sex. It is   not to be used for children, pregnant women, hospitalized patients,    patients on dialysis, or with rapidly changing kidney function. According to the NKDEP, eGFR >89 is normal, 60-89 shows mild impairment, 30-59 shows moderate impairment, 15-29 shows severe impairment and <15 is ESRD.      Since his last office visit his edema in his legs have dramatically improved off Actos and off amlodipine. He no longer has bullae with weeping edema in either leg.  Leg pain and itching has improved dramatically. Unfortunately  blood pressure has risen dangerously high at 178/98. Furthermore diabetes has fallen out of control his hemoglobin A1c has risen to greater than 8.5. He is currently taking 12 units of  Lantus twice a day. He denies any chest pain shortness of breath or dyspnea on exertion. He denies any myalgias or right upper quadrant pain. He denies any hypoglycemic episodes or blurry vision. He denies any changes in his diet  Past Medical History  Diagnosis Date  . Diabetes mellitus   . Hyperlipidemia   . Hypertension   . Gout   . Sleep apnea, obstructive   . Stroke     [id number]  . GERD (gastroesophageal reflux disease)   . Obesity   . Renal calculi   . Chronic venous insufficiency   . Hemiparesis    No past surgical history on file. Current Outpatient Prescriptions on File Prior to Visit  Medication Sig Dispense Refill  . allopurinol (ZYLOPRIM) 100 MG tablet TAKE ONE TABLET BY MOUTH TWICE DAILY 60 tablet 0  . amLODipine (NORVASC) 10 MG tablet Take 1 tablet (10 mg total) by mouth daily. (Patient not taking: Reported on 12/24/2014) 30 tablet 6  . aspirin 81 MG tablet Take 81 mg by mouth every evening.     . bisacodyl (DULCOLAX) 5 MG EC tablet Take 5 mg by mouth daily as needed for constipation.    Marland Kitchen doxazosin (CARDURA) 4 MG tablet TAKE ONE TABLET BY MOUTH ONCE DAILY 30 tablet 0  . hydrochlorothiazide (HYDRODIURIL) 25 MG tablet Take 1 tablet (25 mg total) by mouth daily. 90 tablet 3  . insulin glargine (LANTUS) 100 UNIT/ML injection Inject 0.12 mLs (12 Units total) into the skin 2 (two) times daily. 10 vial 3  . labetalol (NORMODYNE) 300 MG tablet TAKE ONE TABLET BY MOUTH TWICE DAILY 60 tablet 0  . lisinopril (PRINIVIL,ZESTRIL) 20 MG tablet TAKE ONE TABLET BY MOUTH TWICE DAILY 60 tablet 0  . metFORMIN (GLUCOPHAGE) 500 MG tablet Take 1 tablet (500 mg total) by mouth 2 (two) times daily with a meal. 60 tablet 5  . Multiple Vitamin (MULTIVITAMIN WITH MINERALS) TABS tablet Take 1 tablet by mouth daily.    .  pioglitazone (ACTOS) 30 MG tablet Take 1 tablet (30 mg total) by mouth daily. (Patient not taking: Reported on 12/24/2014) 30 tablet 3  . silver sulfADIAZINE (SILVADENE) 1 % cream Apply 1 application topically daily. 50 g 0  . simvastatin (ZOCOR) 40 MG tablet TAKE ONE TABLET BY MOUTH AT BEDTIME 30 tablet 3   No current facility-administered medications on file prior to visit.   No Known Allergies Social History   Social History  . Marital Status: Single    Spouse Name: N/A  . Number of Children: N/A  . Years of Education: N/A   Occupational History  . Not on file.   Social History Main Topics  . Smoking status: Former Research scientist (life sciences)  . Smokeless tobacco: Not on file  . Alcohol Use: Yes     Comment: Occasional  . Drug Use: Yes    Special: Cocaine, Marijuana     Comment: Past Hx  . Sexual Activity: Not on file   Other Topics Concern  . Not on file   Social History Narrative      Review of Systems  All other systems reviewed and are negative.      Objective:   Physical Exam  Constitutional: He appears well-developed and well-nourished.  Cardiovascular: Normal rate, regular rhythm and normal heart sounds.   Pulmonary/Chest: Effort normal and breath sounds normal. No respiratory distress. He has no wheezes. He has no rales.  Abdominal: Soft. Bowel sounds are normal. He exhibits no distension. There is no tenderness.  There is no rebound and no guarding.  Skin: No rash noted. No erythema.  Vitals reviewed. Sitting in wheelchair with chronic right hemiplegia.         Assessment & Plan:  Benign essential HTN - Plan: hydrALAZINE (APRESOLINE) 25 MG tablet  IDDM (insulin dependent diabetes mellitus) (HCC)  HLD (hyperlipidemia)  History of stroke  Blood pressure is elevated. Begin hydralazine 25 mg by mouth 3 times a day and recheck blood pressure in one month. LDL cholesterol is acceptable at less than 70 given his history of stroke.  Continue simvastatin at his current dose  as it seems to be working well. Blood sugar is out of control. Begin Lantus 24 units subcutaneous every morning. Increase Lantus 1 unit every day until fasting blood sugar falls below 130. Once fasting blood sugar has fallen below 130, I would target postprandial sugars and add rapid acting insulin with meals until two-hour postprandial sugars are less than 160. Recheck in one month. He received his flu shot today.

## 2015-07-09 ENCOUNTER — Encounter: Payer: Self-pay | Admitting: Family Medicine

## 2015-07-09 MED ORDER — METFORMIN HCL 500 MG PO TABS: 500.0000 mg | ORAL_TABLET | Freq: Two times a day (BID) | ORAL | Status: DC

## 2015-07-09 MED ORDER — ALLOPURINOL 100 MG PO TABS: 100.0000 mg | ORAL_TABLET | Freq: Two times a day (BID) | ORAL | Status: DC

## 2015-07-09 MED ORDER — HYDRALAZINE HCL 25 MG PO TABS: 25.0000 mg | ORAL_TABLET | Freq: Three times a day (TID) | ORAL | Status: DC

## 2015-07-09 MED ORDER — DOXAZOSIN MESYLATE 4 MG PO TABS: 4.0000 mg | ORAL_TABLET | Freq: Every day | ORAL | Status: DC

## 2015-07-09 MED ORDER — SIMVASTATIN 40 MG PO TABS: 40.0000 mg | ORAL_TABLET | Freq: Every day | ORAL | Status: DC

## 2015-07-09 MED ORDER — LISINOPRIL 20 MG PO TABS: 20.0000 mg | ORAL_TABLET | Freq: Two times a day (BID) | ORAL | Status: DC

## 2015-07-09 MED ORDER — LABETALOL HCL 300 MG PO TABS: 300.0000 mg | ORAL_TABLET | Freq: Two times a day (BID) | ORAL | Status: DC

## 2015-07-09 MED ORDER — HYDROCHLOROTHIAZIDE 25 MG PO TABS: 25.0000 mg | ORAL_TABLET | Freq: Every day | ORAL | Status: DC

## 2015-07-09 MED ORDER — INSULIN GLARGINE 100 UNIT/ML ~~LOC~~ SOLN: 12.0000 [IU] | Freq: Two times a day (BID) | SUBCUTANEOUS | Status: DC

## 2015-07-09 NOTE — Addendum Note (Signed)
Addended by: Shary Decamp B on: 07/09/2015 08:19 AM   Modules accepted: Orders

## 2015-08-11 DIAGNOSIS — H31011 Macula scars of posterior pole (postinflammatory) (post-traumatic), right eye: Secondary | ICD-10-CM | POA: Diagnosis not present

## 2015-09-01 DIAGNOSIS — E1351 Other specified diabetes mellitus with diabetic peripheral angiopathy without gangrene: Secondary | ICD-10-CM | POA: Diagnosis not present

## 2015-09-01 DIAGNOSIS — L602 Onychogryphosis: Secondary | ICD-10-CM | POA: Diagnosis not present

## 2015-09-14 ENCOUNTER — Telehealth: Payer: Self-pay | Admitting: Family Medicine

## 2015-09-14 MED ORDER — INSULIN GLARGINE 100 UNIT/ML ~~LOC~~ SOLN: 100.0000 [IU] | Freq: Every day | SUBCUTANEOUS | Status: DC

## 2015-09-14 NOTE — Telephone Encounter (Addendum)
Patient's sister and caregiver calling to talk to you regarding his insulin  [phone] Eduardo Young she was going to call every hour because he is out of insulin

## 2015-09-14 NOTE — Telephone Encounter (Signed)
Spoke to pt's sister and she is needing a refill on his insulin he is now taking 100 units qd and he uses vials. Med sent to pharm. Also needed a f/u one month after starting new dose and appt made. She will check 2 pp for the next few nights and bring those in with her to his appt.

## 2015-09-17 ENCOUNTER — Encounter: Payer: Self-pay | Admitting: Family Medicine

## 2015-09-17 ENCOUNTER — Ambulatory Visit (INDEPENDENT_AMBULATORY_CARE_PROVIDER_SITE_OTHER): Payer: Medicare Other | Admitting: Family Medicine

## 2015-09-17 VITALS — BP 190/90 | HR 88 | Temp 98.0°F | Resp 20

## 2015-09-17 DIAGNOSIS — I10 Essential (primary) hypertension: Secondary | ICD-10-CM | POA: Diagnosis not present

## 2015-09-17 DIAGNOSIS — Z794 Long term (current) use of insulin: Secondary | ICD-10-CM

## 2015-09-17 DIAGNOSIS — IMO0001 Reserved for inherently not codable concepts without codable children: Secondary | ICD-10-CM

## 2015-09-17 DIAGNOSIS — E119 Type 2 diabetes mellitus without complications: Secondary | ICD-10-CM | POA: Diagnosis not present

## 2015-09-17 DIAGNOSIS — E785 Hyperlipidemia, unspecified: Secondary | ICD-10-CM

## 2015-09-17 MED ORDER — HYDRALAZINE HCL 25 MG PO TABS: 50.0000 mg | ORAL_TABLET | Freq: Three times a day (TID) | ORAL | Status: DC

## 2015-09-17 NOTE — Progress Notes (Signed)
Subjective:    Patient ID: Eduardo Young, male    DOB: 10-05-1955, 60 y.o.   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  He subsequently has developed dependent edema in his right leg and right arm.  Because of this he has a history of venous stasis ulcers in the right leg. Recently he developed a blister/weeping ulcer on the dorsum of the right first MP joint. Earlier this year I started the patient on Actos as an insulin sensitizer. This is contributed more to his edema. Now he is developed a weeping ulcer on the dorsum of his left foot at the MTP joints.  Ulcer on the left foot is 8 cm x 3 cm. Ulcer on the first MP joint is 2 cm x 3 cm.  At that time, my plan was: All the patient's ulcers are due to dependent edema and swelling. If we can focus on managing the swelling, I believe we can prevent his ulcers from forming. Therefore I will have the patient discontinue amlodipine and discontinue Actos.  I will replace amlodipine with hydrochlorothiazide 25 mg a day to both address his blood pressure and help manage the swelling. I will recheck his blood pressure and renal function next week. I recommended the patient wear compression stockings on his legs at all times. I recommended he elevate his right arm as well as his legs is much as possible. I will treat the ulcers on his hand and feet similar to a burn. Cover them with Silvadene cream, nonadherent gauze, and Coban and on a daily basis. Recheck in one week.  12/24/14 Patient is here today for recheck.  The ulcer on left foot has completely healed. The sore and wound on the right hand has completely healed. The swelling in both legs and in both arms is much improved with the medication changes we made last time. Unfortunately his blood pressure slightly elevated at 148/86. He is not checking his blood pressure at home. He is not checking his blood sugar at home. He is overdue for fasting lab work.  At that time, my plan  was: It is apparent to me that the blisters were coming from chronic venous insufficiency and peripheral edema. This is much better since we made the medication changes last time. However we now need to make a focus controlling his blood pressure and controlling his blood sugar. I have asked his sister to check his blood pressure everyday for the next week and report the values to me. I would also like to check fasting blood sugars and two-hour postprandial sugars. I will get hemoglobin A1c today as well as a fasting lipid panel. I suspect that we will start the patient on Invokana 300 mg poqday to help address the swelling, possibly lower his blood pressure, and also help address his diabetes as long as his kidney function can tolerate it.  07/08/15 Most recent LABS are listed below.  Never added invokana as HgA1c was 6.8 at last ov. No visits with results within 1 Week(s) from this visit. Latest known visit with results is:  Appointment on 06/29/2015  Component Date Value Ref Range Status  . Creatinine, Urine 06/29/2015 117  20 - 370 mg/dL Final  . Microalb, Ur 06/29/2015 39.1  Not estab mg/dL Final   Comment: Result confirmed by automatic dilution. Result repeated and verified.   . Microalb Creat Ratio 06/29/2015 334* <30 mcg/mg creat Final   Comment: The ADA has defined  abnormalities in albumin excretion as follows:           Category           Result                            (mcg/mg creatinine)                 Normal:    <30       Microalbuminuria:    30 - 299   Clinical albuminuria:    > or = 300   The ADA recommends that at least two of three specimens collected within a 3 - 6 month period be abnormal before considering a patient to be within a diagnostic category.     . Hgb A1c MFr Bld 06/29/2015 8.5* <5.7 % Final   Comment:                                                                        According to the ADA Clinical Practice Recommendations for 2011, when HbA1c is  used as a screening test:     >=6.5%   Diagnostic of Diabetes Mellitus            (if abnormal result is confirmed)   5.7-6.4%   Increased risk of developing Diabetes Mellitus   References:Diagnosis and Classification of Diabetes Mellitus,Diabetes DGLO,7564,33(IRJJO 1):S62-S69 and Standards of Medical Care in         Diabetes - 2011,Diabetes ACZY,6063,01 (Suppl 1):S11-S61.     . Mean Plasma Glucose 06/29/2015 197* <117 mg/dL Final  . WBC 06/29/2015 10.0  4.0 - 10.5 K/uL Final  . RBC 06/29/2015 4.58  4.22 - 5.81 MIL/uL Final  . Hemoglobin 06/29/2015 13.0  13.0 - 17.0 g/dL Final  . HCT 06/29/2015 39.4  39.0 - 52.0 % Final  . MCV 06/29/2015 86.0  78.0 - 100.0 fL Final  . MCH 06/29/2015 28.4  26.0 - 34.0 pg Final  . MCHC 06/29/2015 33.0  30.0 - 36.0 g/dL Final  . RDW 06/29/2015 15.4  11.5 - 15.5 % Final  . Platelets 06/29/2015 219  150 - 400 K/uL Final  . MPV 06/29/2015 9.8  8.6 - 12.4 fL Final  . Neutrophils Relative % 06/29/2015 66  43 - 77 % Final  . Neutro Abs 06/29/2015 6.6  1.7 - 7.7 K/uL Final  . Lymphocytes Relative 06/29/2015 21  12 - 46 % Final  . Lymphs Abs 06/29/2015 2.1  0.7 - 4.0 K/uL Final  . Monocytes Relative 06/29/2015 4  3 - 12 % Final  . Monocytes Absolute 06/29/2015 0.4  0.1 - 1.0 K/uL Final  . Eosinophils Relative 06/29/2015 9* 0 - 5 % Final  . Eosinophils Absolute 06/29/2015 0.9* 0.0 - 0.7 K/uL Final  . Basophils Relative 06/29/2015 0  0 - 1 % Final  . Basophils Absolute 06/29/2015 0.0  0.0 - 0.1 K/uL Final  . Smear Review 06/29/2015 Criteria for review not met   Final  . Cholesterol 06/29/2015 136  125 - 200 mg/dL Final  . Triglycerides 06/29/2015 282* <150 mg/dL Final  . HDL 06/29/2015 19* >=40 mg/dL Final  . Total CHOL/HDL Ratio 06/29/2015 7.2* <=5.0 Ratio Final  .  VLDL 06/29/2015 56* <30 mg/dL Final  . LDL Cholesterol 06/29/2015 61  <130 mg/dL Final   Comment:   Total Cholesterol/HDL Ratio:CHD Risk                        Coronary Heart Disease Risk  Table                                        Men       Women          1/2 Average Risk              3.4        3.3              Average Risk              5.0        4.4           2X Average Risk              9.6        7.1           3X Average Risk             23.4       11.0 Use the calculated Patient Ratio above and the CHD Risk table  to determine the patient's CHD Risk.   Marland Kitchen TSH 06/29/2015 3.659  0.350 - 4.500 uIU/mL Final  . Sodium 06/29/2015 142  135 - 146 mmol/L Final  . Potassium 06/29/2015 3.2* 3.5 - 5.3 mmol/L Final  . Chloride 06/29/2015 103  98 - 110 mmol/L Final  . CO2 06/29/2015 29  20 - 31 mmol/L Final  . Glucose, Bld 06/29/2015 191* 70 - 99 mg/dL Final  . BUN 06/29/2015 17  7 - 25 mg/dL Final  . Creat 06/29/2015 1.42* 0.70 - 1.33 mg/dL Final  . Total Bilirubin 06/29/2015 0.4  0.2 - 1.2 mg/dL Final  . Alkaline Phosphatase 06/29/2015 82  40 - 115 U/L Final  . AST 06/29/2015 28  10 - 35 U/L Final  . ALT 06/29/2015 36  9 - 46 U/L Final  . Total Protein 06/29/2015 6.0* 6.1 - 8.1 g/dL Final  . Albumin 06/29/2015 3.6  3.6 - 5.1 g/dL Final  . Calcium 06/29/2015 8.3* 8.6 - 10.3 mg/dL Final  . GFR, Est African American 06/29/2015 63  >=60 mL/min Final  . GFR, Est Non African American 06/29/2015 54* >=60 mL/min Final   Comment:   The estimated GFR is a calculation valid for adults (>=33 years old) that uses the CKD-EPI algorithm to adjust for age and sex. It is   not to be used for children, pregnant women, hospitalized patients,    patients on dialysis, or with rapidly changing kidney function. According to the NKDEP, eGFR >89 is normal, 60-89 shows mild impairment, 30-59 shows moderate impairment, 15-29 shows severe impairment and <15 is ESRD.      Since his last office visit his edema in his legs have dramatically improved off Actos and off amlodipine. He no longer has bullae with weeping edema in either leg.  Leg pain and itching has improved dramatically. Unfortunately  blood pressure has risen dangerously high at 178/98. Furthermore diabetes has fallen out of control his hemoglobin A1c has risen to greater than 8.5. He is currently taking 12 units of  Lantus twice a day. He denies any chest pain shortness of breath or dyspnea on exertion. He denies any myalgias or right upper quadrant pain. He denies any hypoglycemic episodes or blurry vision. He denies any changes in his diet.  At that time, my plan was: Blood pressure is elevated. Begin hydralazine 25 mg by mouth 3 times a day and recheck blood pressure in one month. LDL cholesterol is acceptable at less than 70 given his history of stroke.  Continue simvastatin at his current dose as it seems to be working well. Blood sugar is out of control. Begin Lantus 24 units subcutaneous every morning. Increase Lantus 1 unit every day until fasting blood sugar falls below 130. Once fasting blood sugar has fallen below 130, I would target postprandial sugars and add rapid acting insulin with meals until two-hour postprandial sugars are less than 160. Recheck in one month. He received his flu shot today.  09/17/15 He is here for follow up.  Patient ultimately wound up being on 100 units of Lantus once a day. However on that dosage, his fasting blood sugars are between 101 120 consistently. He denies any hypoglycemic episodes. He has started the hydralazine prescribed at last office visit however his blood pressure today is markedly elevated at 190/90. He denies any chest pain shortness of breath or dyspnea on exertion. He is compliant with his medication.  Past Medical History  Diagnosis Date  . Diabetes mellitus   . Hyperlipidemia   . Hypertension   . Gout   . Sleep apnea, obstructive   . Stroke [facility])     [id number]  . GERD (gastroesophageal reflux disease)   . Obesity   . Renal calculi   . Chronic venous insufficiency   . Hemiparesis ([place])    No past surgical history on file. Current Outpatient Prescriptions on File  Prior to Visit  Medication Sig Dispense Refill  . allopurinol (ZYLOPRIM) 100 MG tablet Take 1 tablet (100 mg total) by mouth 2 (two) times daily. 60 tablet 11  . aspirin 81 MG tablet Take 81 mg by mouth every evening.     . bisacodyl (DULCOLAX) 5 MG EC tablet Take 5 mg by mouth daily as needed for constipation.    Marland Kitchen doxazosin (CARDURA) 4 MG tablet Take 1 tablet (4 mg total) by mouth daily. 30 tablet 11  . hydrALAZINE (APRESOLINE) 25 MG tablet Take 1 tablet (25 mg total) by mouth 3 (three) times daily. 90 tablet 11  . hydrochlorothiazide (HYDRODIURIL) 25 MG tablet Take 1 tablet (25 mg total) by mouth daily. 30 tablet 11  . insulin glargine (LANTUS) 100 UNIT/ML injection Inject 1 mL (100 Units total) into the skin daily. 30 vial 3  . labetalol (NORMODYNE) 300 MG tablet Take 1 tablet (300 mg total) by mouth 2 (two) times daily. 60 tablet 11  . lisinopril (PRINIVIL,ZESTRIL) 20 MG tablet Take 1 tablet (20 mg total) by mouth 2 (two) times daily. 60 tablet 11  . metFORMIN (GLUCOPHAGE) 500 MG tablet Take 1 tablet (500 mg total) by mouth 2 (two) times daily with a meal. 60 tablet 5  . Multiple Vitamin (MULTIVITAMIN WITH MINERALS) TABS tablet Take 1 tablet by mouth daily.    . simvastatin (ZOCOR) 40 MG tablet Take 1 tablet (40 mg total) by mouth at bedtime. 30 tablet 5   No current facility-administered medications on file prior to visit.   No Known Allergies Social History   Social History  . Marital Status: Single  Spouse Name: N/A  . Number of Children: N/A  . Years of Education: N/A   Occupational History  . Not on file.   Social History Main Topics  . Smoking status: Former Research scientist (life sciences)  . Smokeless tobacco: Not on file  . Alcohol Use: Yes     Comment: Occasional  . Drug Use: Yes    Special: Cocaine, Marijuana     Comment: Past Hx  . Sexual Activity: Not on file   Other Topics Concern  . Not on file   Social History Narrative      Review of Systems  All other systems reviewed  and are negative.      Objective:   Physical Exam  Constitutional: He appears well-developed and well-nourished.  Cardiovascular: Normal rate, regular rhythm and normal heart sounds.   Pulmonary/Chest: Effort normal and breath sounds normal. No respiratory distress. He has no wheezes. He has no rales.  Abdominal: Soft. Bowel sounds are normal. He exhibits no distension. There is no tenderness. There is no rebound and no guarding.  Skin: No rash noted. No erythema.  Vitals reviewed. Sitting in wheelchair with chronic right hemiplegia.         Assessment & Plan:  Benign essential HTN - Plan: hydrALAZINE (APRESOLINE) 25 MG tablet  IDDM (insulin dependent diabetes mellitus) (HCC)  HLD (hyperlipidemia)  I am very happy with his blood sugars. However I would split the dose of Lantus to try to improve his body's absorption of the subcutaneous insulin. Decrease Lantus dose to 45 units subcutaneous twice daily. This should be adequate to control his blood sugars. I would like him to return fasting for hemoglobin A1c and fasting lipid panel in 3 weeks. His blood pressure is poorly controlled. Increase hydralazine to 50 mg by mouth 3 times a day and recheck blood pressures next week to further titrate medication. I will check his blood pressure 2-3 times a day at home and reporting these values to me next week to determine if this is white coat syndrome or fingers consistent.

## 2015-11-03 DIAGNOSIS — L602 Onychogryphosis: Secondary | ICD-10-CM | POA: Diagnosis not present

## 2015-11-03 DIAGNOSIS — I70293 Other atherosclerosis of native arteries of extremities, bilateral legs: Secondary | ICD-10-CM | POA: Diagnosis not present

## 2015-11-03 DIAGNOSIS — E1351 Other specified diabetes mellitus with diabetic peripheral angiopathy without gangrene: Secondary | ICD-10-CM | POA: Diagnosis not present

## 2016-01-19 DIAGNOSIS — E1351 Other specified diabetes mellitus with diabetic peripheral angiopathy without gangrene: Secondary | ICD-10-CM | POA: Diagnosis not present

## 2016-01-19 DIAGNOSIS — L602 Onychogryphosis: Secondary | ICD-10-CM | POA: Diagnosis not present

## 2016-03-28 DIAGNOSIS — L602 Onychogryphosis: Secondary | ICD-10-CM | POA: Diagnosis not present

## 2016-03-28 DIAGNOSIS — E1351 Other specified diabetes mellitus with diabetic peripheral angiopathy without gangrene: Secondary | ICD-10-CM | POA: Diagnosis not present

## 2016-04-05 ENCOUNTER — Other Ambulatory Visit: Payer: Self-pay | Admitting: Family Medicine

## 2016-04-15 ENCOUNTER — Other Ambulatory Visit: Payer: Self-pay | Admitting: Family Medicine

## 2016-04-25 ENCOUNTER — Other Ambulatory Visit: Payer: Self-pay | Admitting: Family Medicine

## 2016-06-07 DIAGNOSIS — L602 Onychogryphosis: Secondary | ICD-10-CM | POA: Diagnosis not present

## 2016-06-07 DIAGNOSIS — E1151 Type 2 diabetes mellitus with diabetic peripheral angiopathy without gangrene: Secondary | ICD-10-CM | POA: Diagnosis not present

## 2016-07-13 ENCOUNTER — Other Ambulatory Visit: Payer: Self-pay | Admitting: Family Medicine

## 2016-07-13 DIAGNOSIS — I10 Essential (primary) hypertension: Secondary | ICD-10-CM

## 2016-08-22 ENCOUNTER — Ambulatory Visit (INDEPENDENT_AMBULATORY_CARE_PROVIDER_SITE_OTHER): Payer: Medicare Other | Admitting: Family Medicine

## 2016-08-22 ENCOUNTER — Encounter: Payer: Self-pay | Admitting: Family Medicine

## 2016-08-22 VITALS — BP 176/94 | HR 78 | Temp 98.7°F | Resp 20

## 2016-08-22 DIAGNOSIS — E1100 Type 2 diabetes mellitus with hyperosmolarity without nonketotic hyperglycemic-hyperosmolar coma (NKHHC): Secondary | ICD-10-CM

## 2016-08-22 DIAGNOSIS — I10 Essential (primary) hypertension: Secondary | ICD-10-CM

## 2016-08-22 DIAGNOSIS — Z23 Encounter for immunization: Secondary | ICD-10-CM

## 2016-08-22 LAB — COMPLETE METABOLIC PANEL WITH GFR
ALT: 20 U/L (ref 9–46)
AST: 18 U/L (ref 10–35)
Albumin: 3.6 g/dL (ref 3.6–5.1)
Alkaline Phosphatase: 74 U/L (ref 40–115)
BUN: 20 mg/dL (ref 7–25)
CO2: 29 mmol/L (ref 20–31)
Calcium: 8.7 mg/dL (ref 8.6–10.3)
Chloride: 104 mmol/L (ref 98–110)
Creat: 1.7 mg/dL — ABNORMAL HIGH (ref 0.70–1.33)
GFR, Est African American: 50 mL/min — ABNORMAL LOW (ref >=60)
GFR, Est Non African American: 43 mL/min — ABNORMAL LOW (ref >=60)
Glucose, Bld: 166 mg/dL — ABNORMAL HIGH (ref 70–99)
Potassium: 3.4 mmol/L — ABNORMAL LOW (ref 3.5–5.3)
Sodium: 143 mmol/L (ref 135–146)
Total Bilirubin: 0.3 mg/dL (ref 0.2–1.2)
Total Protein: 6.2 g/dL (ref 6.1–8.1)

## 2016-08-22 LAB — LIPID PANEL
Cholesterol: 140 mg/dL (ref <200)
HDL: 21 mg/dL — ABNORMAL LOW (ref >40)
LDL Cholesterol: 68 mg/dL (ref <100)
Total CHOL/HDL Ratio: 6.7 Ratio — ABNORMAL HIGH (ref <5.0)
Triglycerides: 255 mg/dL — ABNORMAL HIGH (ref <150)
VLDL: 51 mg/dL — ABNORMAL HIGH (ref <30)

## 2016-08-22 LAB — CBC WITH DIFFERENTIAL/PLATELET
Basophils Absolute: 0 cells/uL (ref 0–200)
Basophils Relative: 0 %
Eosinophils Absolute: 828 cells/uL — ABNORMAL HIGH (ref 15–500)
Eosinophils Relative: 9 %
HCT: 37.7 % — ABNORMAL LOW (ref 38.5–50.0)
Hemoglobin: 12.3 g/dL — ABNORMAL LOW (ref 13.0–17.0)
Lymphocytes Relative: 22 %
Lymphs Abs: 2024 cells/uL (ref 850–3900)
MCH: 27.9 pg (ref 27.0–33.0)
MCHC: 32.6 g/dL (ref 32.0–36.0)
MCV: 85.5 fL (ref 80.0–100.0)
MPV: 9.2 fL (ref 7.5–12.5)
Monocytes Absolute: 460 cells/uL (ref 200–950)
Monocytes Relative: 5 %
Neutro Abs: 5888 cells/uL (ref 1500–7800)
Neutrophils Relative %: 64 %
Platelets: 214 10*3/uL (ref 140–400)
RBC: 4.41 MIL/uL (ref 4.20–5.80)
RDW: 15.7 % — ABNORMAL HIGH (ref 11.0–15.0)
WBC: 9.2 10*3/uL (ref 3.8–10.8)

## 2016-08-22 MED ORDER — CLONIDINE HCL 0.1 MG PO TABS: 0.1000 mg | ORAL_TABLET | Freq: Three times a day (TID) | ORAL | 3 refills | Status: DC

## 2016-08-22 NOTE — Addendum Note (Signed)
Addended by: Shary Decamp B on: 08/22/2016 02:14 PM   Modules accepted: Orders

## 2016-08-22 NOTE — Progress Notes (Signed)
Subjective:    Patient ID: Eduardo Young, male    DOB: [birth year], 61 y.o.   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  He subsequently has developed dependent edema in his right leg and right arm.  Because of this he has a history of venous stasis ulcers in the right leg. Recently he developed a blister/weeping ulcer on the dorsum of the right first MP joint. Earlier this year I started the patient on Actos as an insulin sensitizer. This is contributed more to his edema. Now he is developed a weeping ulcer on the dorsum of his left foot at the MTP joints.  Ulcer on the left foot is 8 cm x 3 cm. Ulcer on the first MP joint is 2 cm x 3 cm.  At that time, my plan was: All the patient's ulcers are due to dependent edema and swelling. If we can focus on managing the swelling, I believe we can prevent his ulcers from forming. Therefore I will have the patient discontinue amlodipine and discontinue Actos.  I will replace amlodipine with hydrochlorothiazide 25 mg a day to both address his blood pressure and help manage the swelling. I will recheck his blood pressure and renal function next week. I recommended the patient wear compression stockings on his legs at all times. I recommended he elevate his right arm as well as his legs is much as possible. I will treat the ulcers on his hand and feet similar to a burn. Cover them with Silvadene cream, nonadherent gauze, and Coban and on a daily basis. Recheck in one week.  12/24/14 Patient is here today for recheck.  The ulcer on left foot has completely healed. The sore and wound on the right hand has completely healed. The swelling in both legs and in both arms is much improved with the medication changes we made last time. Unfortunately his blood pressure slightly elevated at 148/86. He is not checking his blood pressure at home. He is not checking his blood sugar at home. He is overdue for fasting lab work.  At that time, my plan  was: It is apparent to me that the blisters were coming from chronic venous insufficiency and peripheral edema. This is much better since we made the medication changes last time. However we now need to make a focus controlling his blood pressure and controlling his blood sugar. I have asked his sister to check his blood pressure everyday for the next week and report the values to me. I would also like to check fasting blood sugars and two-hour postprandial sugars. I will get hemoglobin A1c today as well as a fasting lipid panel. I suspect that we will start the patient on Invokana 300 mg poqday to help address the swelling, possibly lower his blood pressure, and also help address his diabetes as long as his kidney function can tolerate it.  07/08/15 Most recent LABS are listed below.  Never added invokana as HgA1c was 6.8 at last ov. No visits with results within 1 Week(s) from this visit.  Latest known visit with results is:  Appointment on 06/29/2015  Component Date Value Ref Range Status  . Creatinine, Urine 06/29/2015 117  20 - 370 mg/dL Final  . Microalb, Ur 06/29/2015 39.1  Not estab mg/dL Final   Comment: Result confirmed by automatic dilution. Result repeated and verified.   . Microalb Creat Ratio 06/29/2015 334* <30 mcg/mg creat Final   Comment: The ADA has defined  abnormalities in albumin excretion as follows:           Category           Result                            (mcg/mg creatinine)                 Normal:    <30       Microalbuminuria:    30 - 299   Clinical albuminuria:    > or = 300   The ADA recommends that at least two of three specimens collected within a 3 - 6 month period be abnormal before considering a patient to be within a diagnostic category.     . Hgb A1c MFr Bld 06/29/2015 8.5* <5.7 % Final   Comment:                                                                        According to the ADA Clinical Practice Recommendations for 2011, when HbA1c is  used as a screening test:     >=6.5%   Diagnostic of Diabetes Mellitus            (if abnormal result is confirmed)   5.7-6.4%   Increased risk of developing Diabetes Mellitus   References:Diagnosis and Classification of Diabetes Mellitus,Diabetes NGEX,5284,13(KGMWN 1):S62-S69 and Standards of Medical Care in         Diabetes - 2011,Diabetes UUVO,5366,44 (Suppl 1):S11-S61.     . Mean Plasma Glucose 06/29/2015 197* <117 mg/dL Final  . WBC 06/29/2015 10.0  4.0 - 10.5 K/uL Final  . RBC 06/29/2015 4.58  4.22 - 5.81 MIL/uL Final  . Hemoglobin 06/29/2015 13.0  13.0 - 17.0 g/dL Final  . HCT 06/29/2015 39.4  39.0 - 52.0 % Final  . MCV 06/29/2015 86.0  78.0 - 100.0 fL Final  . MCH 06/29/2015 28.4  26.0 - 34.0 pg Final  . MCHC 06/29/2015 33.0  30.0 - 36.0 g/dL Final  . RDW 06/29/2015 15.4  11.5 - 15.5 % Final  . Platelets 06/29/2015 219  150 - 400 K/uL Final  . MPV 06/29/2015 9.8  8.6 - 12.4 fL Final  . Neutrophils Relative % 06/29/2015 66  43 - 77 % Final  . Neutro Abs 06/29/2015 6.6  1.7 - 7.7 K/uL Final  . Lymphocytes Relative 06/29/2015 21  12 - 46 % Final  . Lymphs Abs 06/29/2015 2.1  0.7 - 4.0 K/uL Final  . Monocytes Relative 06/29/2015 4  3 - 12 % Final  . Monocytes Absolute 06/29/2015 0.4  0.1 - 1.0 K/uL Final  . Eosinophils Relative 06/29/2015 9* 0 - 5 % Final  . Eosinophils Absolute 06/29/2015 0.9* 0.0 - 0.7 K/uL Final  . Basophils Relative 06/29/2015 0  0 - 1 % Final  . Basophils Absolute 06/29/2015 0.0  0.0 - 0.1 K/uL Final  . Smear Review 06/29/2015 Criteria for review not met   Final  . Cholesterol 06/29/2015 136  125 - 200 mg/dL Final  . Triglycerides 06/29/2015 282* <150 mg/dL Final  . HDL 06/29/2015 19* >=40 mg/dL Final  . Total CHOL/HDL Ratio 06/29/2015 7.2* <=5.0 Ratio Final  .  VLDL 06/29/2015 56* <30 mg/dL Final  . LDL Cholesterol 06/29/2015 61  <130 mg/dL Final   Comment:   Total Cholesterol/HDL Ratio:CHD Risk                        Coronary Heart Disease Risk  Table                                        Men       Women          1/2 Average Risk              3.4        3.3              Average Risk              5.0        4.4           2X Average Risk              9.6        7.1           3X Average Risk             23.4       11.0 Use the calculated Patient Ratio above and the CHD Risk table  to determine the patient's CHD Risk.   Marland Kitchen TSH 06/29/2015 3.659  0.350 - 4.500 uIU/mL Final  . Sodium 06/29/2015 142  135 - 146 mmol/L Final  . Potassium 06/29/2015 3.2* 3.5 - 5.3 mmol/L Final  . Chloride 06/29/2015 103  98 - 110 mmol/L Final  . CO2 06/29/2015 29  20 - 31 mmol/L Final  . Glucose, Bld 06/29/2015 191* 70 - 99 mg/dL Final  . BUN 06/29/2015 17  7 - 25 mg/dL Final  . Creat 06/29/2015 1.42* 0.70 - 1.33 mg/dL Final  . Total Bilirubin 06/29/2015 0.4  0.2 - 1.2 mg/dL Final  . Alkaline Phosphatase 06/29/2015 82  40 - 115 U/L Final  . AST 06/29/2015 28  10 - 35 U/L Final  . ALT 06/29/2015 36  9 - 46 U/L Final  . Total Protein 06/29/2015 6.0* 6.1 - 8.1 g/dL Final  . Albumin 06/29/2015 3.6  3.6 - 5.1 g/dL Final  . Calcium 06/29/2015 8.3* 8.6 - 10.3 mg/dL Final  . GFR, Est African American 06/29/2015 63  >=60 mL/min Final  . GFR, Est Non African American 06/29/2015 54* >=60 mL/min Final   Comment:   The estimated GFR is a calculation valid for adults (>=52 years old) that uses the CKD-EPI algorithm to adjust for age and sex. It is   not to be used for children, pregnant women, hospitalized patients,    patients on dialysis, or with rapidly changing kidney function. According to the NKDEP, eGFR >89 is normal, 60-89 shows mild impairment, 30-59 shows moderate impairment, 15-29 shows severe impairment and <15 is ESRD.      Since his last office visit his edema in his legs have dramatically improved off Actos and off amlodipine. He no longer has bullae with weeping edema in either leg.  Leg pain and itching has improved dramatically. Unfortunately  blood pressure has risen dangerously high at 178/98. Furthermore diabetes has fallen out of control his hemoglobin A1c has risen to greater than 8.5. He is currently taking 12 units of  Lantus twice a day. He denies any chest pain shortness of breath or dyspnea on exertion. He denies any myalgias or right upper quadrant pain. He denies any hypoglycemic episodes or blurry vision. He denies any changes in his diet.  At that time, my plan was: Blood pressure is elevated. Begin hydralazine 25 mg by mouth 3 times a day and recheck blood pressure in one month. LDL cholesterol is acceptable at less than 70 given his history of stroke.  Continue simvastatin at his current dose as it seems to be working well. Blood sugar is out of control. Begin Lantus 24 units subcutaneous every morning. Increase Lantus 1 unit every day until fasting blood sugar falls below 130. Once fasting blood sugar has fallen below 130, I would target postprandial sugars and add rapid acting insulin with meals until two-hour postprandial sugars are less than 160. Recheck in one month. He received his flu shot today.  09/17/15 He is here for follow up.  Patient ultimately wound up being on 100 units of Lantus once a day. However on that dosage, his fasting blood sugars are between 101 120 consistently. He denies any hypoglycemic episodes. He has started the hydralazine prescribed at last office visit however his blood pressure today is markedly elevated at 190/90. He denies any chest pain shortness of breath or dyspnea on exertion. He is compliant with his medication.  At that time, my plan was: I am very happy with his blood sugars. However I would split the dose of Lantus to try to improve his body's absorption of the subcutaneous insulin. Decrease Lantus dose to 45 units subcutaneous twice daily. This should be adequate to control his blood sugars. I would like him to return fasting for hemoglobin A1c and fasting lipid panel in 3 weeks. His blood  pressure is poorly controlled. Increase hydralazine to 50 mg by mouth 3 times a day and recheck blood pressures next week to further titrate medication. I will check his blood pressure 2-3 times a day at home and reporting these values to me next week to determine if this is white coat syndrome or fingers consistent.  08/22/16 Patient has not been seen in almost a year. His insurance stopped paying for his Lantus. Therefore he is been taking less insulin. He is using approximately 50 units of Lantus a day. He is not checking his blood sugar at all. He denies any hypoglycemic episodes. He denies any polyuria or polydipsia or blurry vision. He is not checking his blood pressure. His blood pressure today is extremely high. His sister states that he is only taking hydralazine 25 mg 3 times a day but otherwise he is compliant with his blood pressure medication. He denies any chest pain shortness of breath or dyspnea on exertion  Past Medical History:  Diagnosis Date  . Chronic venous insufficiency   . Diabetes mellitus   . GERD (gastroesophageal reflux disease)   . Gout   . Hemiparesis ([place])   . Hyperlipidemia   . Hypertension   . Obesity   . Renal calculi   . Sleep apnea, obstructive   . Stroke [facility])    [id number]   No past surgical history on file. Current Outpatient Prescriptions on File Prior to Visit  Medication Sig Dispense Refill  . allopurinol (ZYLOPRIM) 100 MG tablet TAKE ONE TABLET BY MOUTH TWICE DAILY 60 tablet 11  . aspirin 81 MG tablet Take 81 mg by mouth every evening.     . bisacodyl (DULCOLAX) 5 MG EC  tablet Take 5 mg by mouth daily as needed for constipation.    Marland Kitchen doxazosin (CARDURA) 4 MG tablet TAKE ONE TABLET BY MOUTH ONCE DAILY 30 tablet 11  . hydrALAZINE (APRESOLINE) 25 MG tablet Take 2 tablets (50 mg total) by mouth 3 (three) times daily. 180 tablet 11  . hydrochlorothiazide (HYDRODIURIL) 25 MG tablet TAKE ONE TABLET BY MOUTH ONCE DAILY 30 tablet 11  . labetalol (NORMODYNE)  300 MG tablet TAKE ONE TABLET BY MOUTH TWICE DAILY 60 tablet 11  . LANTUS 100 UNIT/ML injection INJECT 100 UNITS SUBCUTANEOUSLY ONCE DAILY (Patient taking differently: INJECT 25 UNITS SUBCUTANEOUSLY BID) 30 mL 3  . lisinopril (PRINIVIL,ZESTRIL) 20 MG tablet TAKE ONE TABLET BY MOUTH TWICE DAILY 60 tablet 11  . metFORMIN (GLUCOPHAGE) 500 MG tablet TAKE ONE TABLET BY MOUTH TWICE DAILY WITH A MEAL 60 tablet 5  . Multiple Vitamin (MULTIVITAMIN WITH MINERALS) TABS tablet Take 1 tablet by mouth daily.    . simvastatin (ZOCOR) 40 MG tablet TAKE ONE TABLET BY MOUTH AT BEDTIME 30 tablet 3   No current facility-administered medications on file prior to visit.    No Known Allergies Social History   Social History  . Marital status: Single    Spouse name: N/A  . Number of children: N/A  . Years of education: N/A   Occupational History  . Not on file.   Social History Main Topics  . Smoking status: Former Research scientist (life sciences)  . Smokeless tobacco: Never Used  . Alcohol use Yes     Comment: Occasional  . Drug use: Yes    Types: Cocaine, Marijuana     Comment: Past Hx  . Sexual activity: Not on file   Other Topics Concern  . Not on file   Social History Narrative  . No narrative on file      Review of Systems  All other systems reviewed and are negative.      Objective:   Physical Exam  Constitutional: He appears well-developed and well-nourished.  Cardiovascular: Normal rate, regular rhythm and normal heart sounds.   Pulmonary/Chest: Effort normal and breath sounds normal. No respiratory distress. He has no wheezes. He has no rales.  Abdominal: Soft. Bowel sounds are normal. He exhibits no distension. There is no tenderness. There is no rebound and no guarding.  Skin: No rash noted. No erythema.  Vitals reviewed. Sitting in wheelchair with chronic right hemiplegia.         Assessment & Plan:  Benign essential HTN - Plan: cloNIDine (CATAPRES) 0.1 MG tablet  Uncontrolled type 2 diabetes  mellitus with hyperosmolarity without coma, without long-term current use of insulin (HCC) - Plan: CBC with Differential/Platelet, COMPLETE METABOLIC PANEL WITH GFR, Hemoglobin A1c, Lipid panel  I spent 20 minutes today explaining what's going to happen to this patient. This was done in an effort to illustrate what can happen if he continues to disregard his diabetes. He has suffered a lot in his life. He is confined to wheelchair with right hemiplegia. I explained to the patient that things can actually get worse. I expect that he will go blind and be on dialysis given his uncontrolled blood pressure and blood sugars. I begged the patient to help me help him. He drinks grape soda every day. He drinks multiple grape sodas. He eats a high carbohydrate diet rich in bread and potatoes rice and pasta. He is making no effort to control her sugars. In no uncertain terms, I explained to the patient that this disease  will eat him alive. It is up to him and him alone to control his blood sugars. I will do everything in my power to help him but he must make an effort. I will check his hemoglobin A1c today. I will likely start the patient on basaglar 45 units twice a day. Add clonidine 0.1 mg by mouth 3 times a day and recheck blood pressure in one week. Spent 20 minutes explaining a low carbohydrate diet again and the dietary changes that need to happen in order for this patient to control his sugars. Time will tell

## 2016-08-23 LAB — HEMOGLOBIN A1C
Hgb A1c MFr Bld: 7.2 % — ABNORMAL HIGH (ref <5.7)
Mean Plasma Glucose: 160 mg/dL

## 2016-08-25 ENCOUNTER — Telehealth: Payer: Self-pay | Admitting: Family Medicine

## 2016-08-25 MED ORDER — BASAGLAR KWIKPEN 100 UNIT/ML ~~LOC~~ SOPN: 55.0000 [IU] | PEN_INJECTOR | Freq: Every day | SUBCUTANEOUS | 3 refills | Status: DC

## 2016-08-25 NOTE — Telephone Encounter (Signed)
Pt calling in to see about new diabetic med that would be called in after A1C test.

## 2016-08-25 NOTE — Telephone Encounter (Signed)
Pt's sister aware and med sent to pharmacy

## 2016-09-06 DIAGNOSIS — E1351 Other specified diabetes mellitus with diabetic peripheral angiopathy without gangrene: Secondary | ICD-10-CM | POA: Diagnosis not present

## 2016-09-06 DIAGNOSIS — L602 Onychogryphosis: Secondary | ICD-10-CM | POA: Diagnosis not present

## 2016-09-07 ENCOUNTER — Encounter (HOSPITAL_COMMUNITY): Payer: Self-pay

## 2016-09-07 ENCOUNTER — Observation Stay (HOSPITAL_COMMUNITY)
Admission: EM | Admit: 2016-09-07 | Discharge: 2016-09-08 | Disposition: A | Discharge status: Rehabilitation Facility | Payer: Medicare Other | Attending: Internal Medicine | Admitting: Internal Medicine

## 2016-09-07 ENCOUNTER — Emergency Department (HOSPITAL_COMMUNITY): Payer: Medicare Other

## 2016-09-07 DIAGNOSIS — N189 Chronic kidney disease, unspecified: Secondary | ICD-10-CM | POA: Diagnosis not present

## 2016-09-07 DIAGNOSIS — E876 Hypokalemia: Secondary | ICD-10-CM | POA: Insufficient documentation

## 2016-09-07 DIAGNOSIS — G459 Transient cerebral ischemic attack, unspecified: Principal | ICD-10-CM | POA: Diagnosis present

## 2016-09-07 DIAGNOSIS — E119 Type 2 diabetes mellitus without complications: Secondary | ICD-10-CM

## 2016-09-07 DIAGNOSIS — I639 Cerebral infarction, unspecified: Secondary | ICD-10-CM | POA: Diagnosis present

## 2016-09-07 DIAGNOSIS — Z6837 Body mass index (BMI) 37.0-37.9, adult: Secondary | ICD-10-CM | POA: Diagnosis not present

## 2016-09-07 DIAGNOSIS — R9431 Abnormal electrocardiogram [ECG] [EKG]: Secondary | ICD-10-CM | POA: Diagnosis not present

## 2016-09-07 DIAGNOSIS — Z7982 Long term (current) use of aspirin: Secondary | ICD-10-CM | POA: Diagnosis not present

## 2016-09-07 DIAGNOSIS — K219 Gastro-esophageal reflux disease without esophagitis: Secondary | ICD-10-CM | POA: Insufficient documentation

## 2016-09-07 DIAGNOSIS — I69351 Hemiplegia and hemiparesis following cerebral infarction affecting right dominant side: Secondary | ICD-10-CM | POA: Diagnosis not present

## 2016-09-07 DIAGNOSIS — E785 Hyperlipidemia, unspecified: Secondary | ICD-10-CM | POA: Diagnosis present

## 2016-09-07 DIAGNOSIS — Z87891 Personal history of nicotine dependence: Secondary | ICD-10-CM | POA: Insufficient documentation

## 2016-09-07 DIAGNOSIS — Z823 Family history of stroke: Secondary | ICD-10-CM | POA: Diagnosis not present

## 2016-09-07 DIAGNOSIS — G4733 Obstructive sleep apnea (adult) (pediatric): Secondary | ICD-10-CM | POA: Diagnosis present

## 2016-09-07 DIAGNOSIS — R569 Unspecified convulsions: Secondary | ICD-10-CM

## 2016-09-07 DIAGNOSIS — R269 Unspecified abnormalities of gait and mobility: Secondary | ICD-10-CM | POA: Insufficient documentation

## 2016-09-07 DIAGNOSIS — G4730 Sleep apnea, unspecified: Secondary | ICD-10-CM | POA: Diagnosis present

## 2016-09-07 DIAGNOSIS — Z79899 Other long term (current) drug therapy: Secondary | ICD-10-CM | POA: Diagnosis not present

## 2016-09-07 DIAGNOSIS — Z794 Long term (current) use of insulin: Secondary | ICD-10-CM | POA: Insufficient documentation

## 2016-09-07 DIAGNOSIS — R531 Weakness: Secondary | ICD-10-CM

## 2016-09-07 DIAGNOSIS — I872 Venous insufficiency (chronic) (peripheral): Secondary | ICD-10-CM | POA: Insufficient documentation

## 2016-09-07 DIAGNOSIS — R404 Transient alteration of awareness: Secondary | ICD-10-CM | POA: Diagnosis not present

## 2016-09-07 DIAGNOSIS — I10 Essential (primary) hypertension: Secondary | ICD-10-CM | POA: Diagnosis present

## 2016-09-07 DIAGNOSIS — I129 Hypertensive chronic kidney disease with stage 1 through stage 4 chronic kidney disease, or unspecified chronic kidney disease: Secondary | ICD-10-CM | POA: Insufficient documentation

## 2016-09-07 DIAGNOSIS — G253 Myoclonus: Secondary | ICD-10-CM

## 2016-09-07 LAB — URINALYSIS, ROUTINE W REFLEX MICROSCOPIC
Bacteria, UA: NONE SEEN
Bilirubin Urine: NEGATIVE
Glucose, UA: NEGATIVE mg/dL
Hgb urine dipstick: NEGATIVE
Ketones, ur: NEGATIVE mg/dL
Leukocytes, UA: NEGATIVE
Nitrite: NEGATIVE
Protein, ur: 30 mg/dL — AB
Specific Gravity, Urine: 1.017 (ref 1.005–1.030)
pH: 5 (ref 5.0–8.0)

## 2016-09-07 LAB — PROTIME-INR
INR: 0.98
Prothrombin Time: 12.9 seconds (ref 11.4–15.2)

## 2016-09-07 LAB — COMPREHENSIVE METABOLIC PANEL
ALT: 27 U/L (ref 17–63)
AST: 24 U/L (ref 15–41)
Albumin: 3.7 g/dL (ref 3.5–5.0)
Alkaline Phosphatase: 72 U/L (ref 38–126)
Anion gap: 12 (ref 5–15)
BUN: 24 mg/dL — ABNORMAL HIGH (ref 6–20)
CO2: 26 mmol/L (ref 22–32)
Calcium: 9.1 mg/dL (ref 8.9–10.3)
Chloride: 101 mmol/L (ref 101–111)
Creatinine, Ser: 1.8 mg/dL — ABNORMAL HIGH (ref 0.61–1.24)
GFR calc Af Amer: 46 mL/min — ABNORMAL LOW (ref >60)
GFR calc non Af Amer: 40 mL/min — ABNORMAL LOW (ref >60)
Glucose, Bld: 112 mg/dL — ABNORMAL HIGH (ref 65–99)
Potassium: 3.1 mmol/L — ABNORMAL LOW (ref 3.5–5.1)
Sodium: 139 mmol/L (ref 135–145)
Total Bilirubin: 0.8 mg/dL (ref 0.3–1.2)
Total Protein: 6.5 g/dL (ref 6.5–8.1)

## 2016-09-07 LAB — CBC
HCT: 38 % — ABNORMAL LOW (ref 39.0–52.0)
Hemoglobin: 12.3 g/dL — ABNORMAL LOW (ref 13.0–17.0)
MCH: 27.7 pg (ref 26.0–34.0)
MCHC: 32.4 g/dL (ref 30.0–36.0)
MCV: 85.6 fL (ref 78.0–100.0)
Platelets: 224 10*3/uL (ref 150–400)
RBC: 4.44 MIL/uL (ref 4.22–5.81)
RDW: 15 % (ref 11.5–15.5)
WBC: 11.3 10*3/uL — ABNORMAL HIGH (ref 4.0–10.5)

## 2016-09-07 LAB — I-STAT TROPONIN, ED: Troponin i, poc: 0 ng/mL (ref 0.00–0.08)

## 2016-09-07 LAB — DIFFERENTIAL
Basophils Absolute: 0 10*3/uL (ref 0.0–0.1)
Basophils Relative: 0 %
Eosinophils Absolute: 0.7 10*3/uL (ref 0.0–0.7)
Eosinophils Relative: 6 %
Lymphocytes Relative: 13 %
Lymphs Abs: 1.5 10*3/uL (ref 0.7–4.0)
Monocytes Absolute: 0.5 10*3/uL (ref 0.1–1.0)
Monocytes Relative: 4 %
Neutro Abs: 8.5 10*3/uL — ABNORMAL HIGH (ref 1.7–7.7)
Neutrophils Relative %: 77 %

## 2016-09-07 LAB — RAPID URINE DRUG SCREEN, HOSP PERFORMED
Amphetamines: NOT DETECTED
Barbiturates: NOT DETECTED
Benzodiazepines: NOT DETECTED
Cocaine: NOT DETECTED
Opiates: NOT DETECTED
Tetrahydrocannabinol: NOT DETECTED

## 2016-09-07 LAB — ETHANOL: Alcohol, Ethyl (B): <5 mg/dL (ref <5)

## 2016-09-07 LAB — APTT: aPTT: 39 seconds — ABNORMAL HIGH (ref 24–36)

## 2016-09-07 MED ORDER — SODIUM CHLORIDE 0.9 % IV SOLN
Freq: Once | INTRAVENOUS | Status: AC
  Administered 2016-09-07: 23:00:00 via INTRAVENOUS
  Filled 2016-09-07: qty 1000

## 2016-09-07 NOTE — ED Triage Notes (Addendum)
Pt presents to the ed with ems from home, he had a stroke in 2008 and is paralyzed in his right arm, has weakness in his right leg and has right facial droop deficit, today at 1830 he felt as if his right leg is weaker than normal, he could not walk as well as normal at home, he feels his speech is the same and is not having any pain, he feels back to normal now patient alert and oriented, edp notified of patients complaints on arrival

## 2016-09-07 NOTE — H&P (Signed)
History and Physical    Eduardo Young:[id number] DOB: 12/11/1955 DOA: 09/07/2016  PCP: Odette Fraction, MD   Patient coming from: Home.  Chief Complaint: Right lower extremity weakness.  HPI: Eduardo Young is a 61 y.o. male with medical history significant of chronic venous insufficiency, type 2 diabetes, GERD, gout, CVA with residual right-sided hemiparesis, hyperlipidemia, hypertension, morbid obesity, obstructive sleep apnea not using CPAP since 2 years ago, urolithiasis who was brought to the emergency department with complaints of worsening right lower extremity weakness.  Per patient, he noticed this while he was going to the bathroom. He mentions that he sat down on the toilet and was unable to get up. He states that he gets some of these episodes several times a week, but never has been this intense. Today EMS had to be called since he was unable to stand on his own. He and his sister deny headache, vision changes, or slurred speech. He denies chest pain, palpitations, dizziness, diaphoresis or pitting edema of the lower extremities. His sister mentions that he frequently has pulses while sleeping and has not used his CPAP in a while.  ED Course: CT scan of the brain did not show any acute abnormalities. UDS was negative. Urine analysis showed mild proteinuria 30 mg/dL, PT/INR/PTT within normal limits, WBC 11.3, hemoglobin 12.3 and platelets 224. Troponin was negative, alcohol level was less than 5 mg/dL. His sodium 139, potassium 3.1, chloride 101, bicarbonate 26 mmol/L. BUN was 24, creatinine 1.8 and glucose 112 mg/dL.   Review of Systems: As per HPI otherwise 10 point review of systems negative.    Past Medical History:  Diagnosis Date  . Chronic venous insufficiency   . Diabetes mellitus   . GERD (gastroesophageal reflux disease)   . Gout   . Hemiparesis ([place])   . Hyperlipidemia   . Hypertension   . Obesity   . Renal calculi   . Sleep apnea, obstructive   .  Stroke [facility])    [id number]    History reviewed. No pertinent surgical history.   reports that he has quit smoking. He has never used smokeless tobacco. He reports that he drinks alcohol. He reports that he uses drugs, including Cocaine and Marijuana.  No Known Allergies  Family History  Problem Relation Age of Onset  . Heart attack Mother   . Hypertension Mother   . Hyperlipidemia Mother   . Hypertension Father   . Hyperlipidemia Father   . CVA Father   . Heart attack Sister   . Lung cancer Brother   . Heart attack Brother   . Ovarian cancer Sister   . Heart attack Sister   . CVA Sister     Prior to Admission medications   Medication Sig Start Date End Date Taking? Authorizing Provider  allopurinol (ZYLOPRIM) 100 MG tablet TAKE ONE TABLET BY MOUTH TWICE DAILY 07/13/16  Yes Eduardo Frizzle, MD  aspirin 81 MG tablet Take 81 mg by mouth every evening.    Yes Historical Provider, MD  bisacodyl (DULCOLAX) 5 MG EC tablet Take 5 mg by mouth daily as needed for constipation.   Yes Historical Provider, MD  cloNIDine (CATAPRES) 0.1 MG tablet Take 1 tablet (0.1 mg total) by mouth 3 (three) times daily. Patient taking differently: Take 0.1 mg by mouth at bedtime.  08/22/16  Yes Eduardo Frizzle, MD  doxazosin (CARDURA) 4 MG tablet TAKE ONE TABLET BY MOUTH ONCE DAILY 07/13/16  Yes Eduardo Frizzle, MD  hydrALAZINE (  APRESOLINE) 25 MG tablet Take 2 tablets (50 mg total) by mouth 3 (three) times daily. 09/17/15  Yes Eduardo Frizzle, MD  hydrochlorothiazide (HYDRODIURIL) 25 MG tablet TAKE ONE TABLET BY MOUTH ONCE DAILY 07/13/16  Yes Eduardo Frizzle, MD  insulin glargine (LANTUS) 100 UNIT/ML injection Inject 20 Units into the skin 2 (two) times daily.   Yes Historical Provider, MD  labetalol (NORMODYNE) 300 MG tablet TAKE ONE TABLET BY MOUTH TWICE DAILY 07/13/16  Yes Eduardo Frizzle, MD  lisinopril (PRINIVIL,ZESTRIL) 20 MG tablet TAKE ONE TABLET BY MOUTH TWICE DAILY 07/13/16  Yes Eduardo Frizzle, MD  metFORMIN (GLUCOPHAGE) 500 MG tablet TAKE ONE TABLET BY MOUTH TWICE DAILY WITH A MEAL 07/13/16  Yes Eduardo Frizzle, MD  Multiple Vitamin (MULTIVITAMIN WITH MINERALS) TABS tablet Take 1 tablet by mouth daily.   Yes Historical Provider, MD  simvastatin (ZOCOR) 40 MG tablet TAKE ONE TABLET BY MOUTH AT BEDTIME 04/25/16  Yes Eduardo Frizzle, MD  Insulin Glargine (BASAGLAR KWIKPEN) 100 UNIT/ML SOPN Inject 0.55 mLs (55 Units total) into the skin daily. Patient not taking: Reported on 09/07/2016 08/25/16   Eduardo Frizzle, MD    Physical Exam:  Constitutional: NAD, calm, comfortable Vitals:   09/07/16 2200 09/07/16 2230 09/07/16 2300 09/07/16 2323  BP: 164/96 181/90 188/94 198/94  Pulse: (!) 56 (!) 57 (!) 56 63  Resp: 19 20 17 25   Temp:    97.7 F (36.5 C)  TempSrc:    Oral  SpO2: 95% 94% 94% 95%  Weight:      Height:       Eyes: PERRL, lids and conjunctivae normal ENMT: Mucous membranes are moist. Posterior pharynx clear of any exudate or lesions. Neck: normal, supple, no masses, no thyromegaly Respiratory: clear to auscultation bilaterally, no wheezing, no crackles. Normal respiratory effort. No accessory muscle use.  Cardiovascular: Regular rate and rhythm, no murmurs / rubs / gallops. No extremity edema. 2+ pedal pulses. No carotid bruits.  Abdomen: Obese, soft, no tenderness, no masses palpated. No hepatosplenomegaly. Bowel sounds positive.  Musculoskeletal: no clubbing / cyanosis. Right sided extremities decreased ROM, no contractures. Spastic paresis of right upper extremity.  Skin: no rashes, lesions, ulcers on limited skin exam. Neurologic: CN 2-12 grossly intact. Decreased sensation on right extremities, RUA > LUA, RUA 0/5 spastic, RLE 3/5. Psychiatric: Normal judgment and insight. Alert and oriented x 3. Mildly anxious mood.    Labs on Admission: I have personally reviewed following labs and imaging studies  CBC:  Recent Labs Lab 09/07/16 2036  WBC 11.3*    NEUTROABS 8.5*  HGB 12.3*  HCT 38.0*  MCV 85.6  PLT 161   Basic Metabolic Panel:  Recent Labs Lab 09/07/16 2036  NA 139  K 3.1*  CL 101  CO2 26  GLUCOSE 112*  BUN 24*  CREATININE 1.80*  CALCIUM 9.1   GFR: Estimated Creatinine Clearance: 66.5 mL/min (by C-G formula based on SCr of 1.8 mg/dL (H)). Liver Function Tests:  Recent Labs Lab 09/07/16 2036  AST 24  ALT 27  ALKPHOS 72  BILITOT 0.8  PROT 6.5  ALBUMIN 3.7   No results for input(s): LIPASE, AMYLASE in the last 168 hours. No results for input(s): AMMONIA in the last 168 hours. Coagulation Profile:  Recent Labs Lab 09/07/16 2036  INR 0.98   Cardiac Enzymes: No results for input(s): CKTOTAL, CKMB, CKMBINDEX, TROPONINI in the last 168 hours. BNP (last 3 results) No results for input(s): PROBNP in the  last 8760 hours. HbA1C: No results for input(s): HGBA1C in the last 72 hours. CBG: No results for input(s): GLUCAP in the last 168 hours. Lipid Profile: No results for input(s): CHOL, HDL, LDLCALC, TRIG, CHOLHDL, LDLDIRECT in the last 72 hours. Thyroid Function Tests: No results for input(s): TSH, T4TOTAL, FREET4, T3FREE, THYROIDAB in the last 72 hours. Anemia Panel: No results for input(s): VITAMINB12, FOLATE, FERRITIN, TIBC, IRON, RETICCTPCT in the last 72 hours. Urine analysis:    Component Value Date/Time   COLORURINE YELLOW 09/07/2016 2243   APPEARANCEUR CLEAR 09/07/2016 2243   LABSPEC 1.017 09/07/2016 2243   PHURINE 5.0 09/07/2016 2243   GLUCOSEU NEGATIVE 09/07/2016 2243   HGBUR NEGATIVE 09/07/2016 2243   BILIRUBINUR NEGATIVE 09/07/2016 2243   KETONESUR NEGATIVE 09/07/2016 2243   PROTEINUR 30 (A) 09/07/2016 2243   UROBILINOGEN 1.0 11/15/2013 0952   NITRITE NEGATIVE 09/07/2016 2243   LEUKOCYTESUR NEGATIVE 09/07/2016 2243    Radiological Exams on Admission: Ct Head Wo Contrast  Result Date: 09/07/2016 CLINICAL DATA:  Right leg weakness.  History of stroke. EXAM: CT HEAD WITHOUT CONTRAST  TECHNIQUE: Contiguous axial images were obtained from the base of the skull through the vertex without intravenous contrast. 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 Slight ex vacuo dilatation of body of the left lateral ventricle. Chronic small vessel ischemic disease of periventricular white matter. No acute intracranial hemorrhage, midline shift or edema. No extra-axial fluid. Vascular: No hyperdense vessels or unexpected calcifications. Skull: Intact. Sinuses/Orbits: Clear mastoids. Moderate ethmoid and frontal sinus mucosal thickening. Mild bilateral maxillary and ethmoid sinus mucosal thickening. Other: None IMPRESSION: 1. Chronic small vessel ischemic disease of periventricular white matter. 2. 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 3. No acute intracranial abnormality. 4. Chronic paranasal sinusitis. Electronically Signed   By: Ashley Royalty M.D.   On: 09/07/2016 21:11    EKG: Independently reviewed. Vent. rate 67 BPM PR interval * ms QRS duration 123 ms QT/QTc 441/466 ms P-R-T axes 13 16 65 Sinus rhythm Nonspecific intraventricular conduction delay Consider anterior infarct Baseline wander in lead(s) V3  Assessment/Plan Principal Problem:   TIA (transient ischemic attack) Admit to telemetry/observation. Frequent neuro checks. Check fasting lipids and hemoglobin A1c. Check echocardiogram and carotid Doppler. Check MRI of the brain. EEG per neurology. He needs to start [place].  Active Problems:   History of cerebral infarction (HCC) Right-sided hemiparesis. Supportive care. PT/OT evaluation in a.m.    DM (diabetes mellitus) ([place]) Carbohydrate modified diet. Continue long-acting insulin. CBG monitoring with regular insulin sliding scale.    HLD (hyperlipidemia)  Continue simvastatin 40 mg by mouth at bedtime. Monitor LFTs periodically.    HTN  (hypertension) Continue Cardura 4 mg by mouth daily. Continue hydralazine 50 mg by mouth 3 times a day. Continue lisinopril 20 mg by mouth daily. Continue labetalol 300 mg by mouth twice a day Continue hydrochlorothiazide 25 mg by mouth daily during I will start potassium supplementation due to hypokalemia.    Sleep apnea Per patient's sister, he is having significant apnea episodes. Restart CPAP daily at bedtime. I advised the patient to continue use at home to decrease the risk of complications.    DVT prophylaxis: Lovenox SQ. Code Status: Full code. Family Communication: Sister was present in the room. Disposition Plan: Admit for overnight observation and TIA workup. Consults called: Neurology Roland Rack, MD) Admission status: Observation/telemetry.   Reubin Milan MD [facility] Pager [phone]  If 7PM-7AM,  please contact night-coverage [email or web address] Password [email or web address]  09/07/2016, 11:40 PM

## 2016-09-07 NOTE — ED Provider Notes (Signed)
[facility] DEPT Provider Note   CSN: [id number] Arrival date & time: 09/07/16  2009     History   Chief Complaint Chief Complaint  Patient presents with  . Weakness    HPI Eduardo Young is a 61 y.o. male.  HPI Patient with history of right-sided weakness after stroke 10 years ago. He has some recovery of the strength of his right lower extremity. Continues to have paralysis of his right upper extremity. Has residual right-sided facial droop. Patient states around 6:30 pmhe was using the bathroom and noticed he had increased right leg weakness while trying to get up from the toilet. Denies numbness, visual changes or speech changes. The weakness has since resolved. Patient states he is at his baseline. Denies any lightheadedness, chest pain or shortness of breath. No nausea or vomiting. Past Medical History:  Diagnosis Date  . Chronic venous insufficiency   . Diabetes mellitus   . GERD (gastroesophageal reflux disease)   . Gout   . Hemiparesis ([place])   . Hyperlipidemia   . Hypertension   . Obesity   . Renal calculi   . Sleep apnea, obstructive   . Stroke [facility])    [id number]    Patient Active Problem List   Diagnosis Date Noted  . TIA (transient ischemic attack) 09/07/2016  . Chronic venous insufficiency   . CVA (cerebral infarction) 10/21/2010  . DM (diabetes mellitus) ([place]) 10/21/2010  . HLD (hyperlipidemia) 10/21/2010  . HTN (hypertension) 10/21/2010  . Sleep apnea 10/21/2010    History reviewed. No pertinent surgical history.     Home Medications    Prior to Admission medications   Medication Sig Start Date End Date Taking? Authorizing Provider  allopurinol (ZYLOPRIM) 100 MG tablet TAKE ONE TABLET BY MOUTH TWICE DAILY 07/13/16  Yes Susy Frizzle, MD  aspirin 81 MG tablet Take 81 mg by mouth every evening.    Yes Historical Provider, MD  bisacodyl (DULCOLAX) 5 MG EC tablet Take 5 mg by mouth daily as needed for constipation.   Yes Historical Provider,  MD  cloNIDine (CATAPRES) 0.1 MG tablet Take 1 tablet (0.1 mg total) by mouth 3 (three) times daily. Patient taking differently: Take 0.1 mg by mouth at bedtime.  08/22/16  Yes Susy Frizzle, MD  doxazosin (CARDURA) 4 MG tablet TAKE ONE TABLET BY MOUTH ONCE DAILY 07/13/16  Yes Susy Frizzle, MD  hydrALAZINE (APRESOLINE) 25 MG tablet Take 2 tablets (50 mg total) by mouth 3 (three) times daily. 09/17/15  Yes Susy Frizzle, MD  hydrochlorothiazide (HYDRODIURIL) 25 MG tablet TAKE ONE TABLET BY MOUTH ONCE DAILY 07/13/16  Yes Susy Frizzle, MD  insulin glargine (LANTUS) 100 UNIT/ML injection Inject 20 Units into the skin 2 (two) times daily.   Yes Historical Provider, MD  labetalol (NORMODYNE) 300 MG tablet TAKE ONE TABLET BY MOUTH TWICE DAILY 07/13/16  Yes Susy Frizzle, MD  lisinopril (PRINIVIL,ZESTRIL) 20 MG tablet TAKE ONE TABLET BY MOUTH TWICE DAILY 07/13/16  Yes Susy Frizzle, MD  metFORMIN (GLUCOPHAGE) 500 MG tablet TAKE ONE TABLET BY MOUTH TWICE DAILY WITH A MEAL 07/13/16  Yes Susy Frizzle, MD  Multiple Vitamin (MULTIVITAMIN WITH MINERALS) TABS tablet Take 1 tablet by mouth daily.   Yes Historical Provider, MD  simvastatin (ZOCOR) 40 MG tablet TAKE ONE TABLET BY MOUTH AT BEDTIME 04/25/16  Yes Susy Frizzle, MD  Insulin Glargine (BASAGLAR KWIKPEN) 100 UNIT/ML SOPN Inject 0.55 mLs (55 Units total) into the skin daily. Patient  not taking: Reported on 09/07/2016 08/25/16   Susy Frizzle, MD    Family History No family history on file.  Social History Social History  Substance Use Topics  . Smoking status: Former Research scientist (life sciences)  . Smokeless tobacco: Never Used  . Alcohol use Yes     Comment: Occasional     Allergies   Patient has no known allergies.   Review of Systems Review of Systems  Constitutional: Negative for chills and fever.  HENT: Negative for facial swelling.   Eyes: Negative for visual disturbance.  Respiratory: Negative for cough and shortness of breath.     Cardiovascular: Negative for chest pain.  Gastrointestinal: Negative for abdominal pain, diarrhea, nausea and vomiting.  Musculoskeletal: Negative for back pain, neck pain and neck stiffness.  Skin: Negative for rash and wound.  Neurological: Positive for weakness. Negative for dizziness, syncope, speech difficulty, numbness and headaches.  All other systems reviewed and are negative.    Physical Exam Updated Vital Signs BP 198/94 (BP Location: Right Arm)   Pulse 63   Temp 97.7 F (36.5 C) (Oral)   Resp 25   Ht 6' 3"  (1.905 m)   Wt 300 lb (136.1 kg)   SpO2 95%   BMI 37.50 kg/m   Physical Exam  Constitutional: He is oriented to person, place, and time. He appears well-developed and well-nourished. No distress.  HENT:  Head: Normocephalic and atraumatic.  Mouth/Throat: Oropharynx is clear and moist. No oropharyngeal exudate.  Patient has mild right lower facial droop. Slightly slurred speech.  Eyes: EOM are normal. Pupils are equal, round, and reactive to light.  Neck: Normal range of motion. Neck supple.  Cardiovascular: Normal rate and regular rhythm.  Exam reveals no gallop and no friction rub.   No murmur heard. Pulmonary/Chest: Effort normal and breath sounds normal. No respiratory distress. He has no wheezes. He has no rales. He exhibits no tenderness.  Abdominal: Soft. Bowel sounds are normal. There is no tenderness. There is no rebound and no guarding.  Musculoskeletal: Normal range of motion. He exhibits no edema or tenderness.  No lower extremity swelling or asymmetry.  Neurological: He is alert and oriented to person, place, and time.  Patient is alert and oriented x3 with mildly slurred speech. Patient has 5/5 motor in left upper and lower extremities. Has 3/5 motor in right lower extremity. Patient has 0/5 motor in right upper extremity. Sensation grossly intact. Finger to nose testing with left upper extremity is intact   Skin: Skin is warm and dry. No rash noted.  No erythema.  Psychiatric: He has a normal mood and affect. His behavior is normal.  Nursing note and vitals reviewed.    ED Treatments / Results  Labs (all labs ordered are listed, but only abnormal results are displayed) Labs Reviewed  APTT - Abnormal; Notable for the following:       Result Value   aPTT 39 (*)    All other components within normal limits  CBC - Abnormal; Notable for the following:    WBC 11.3 (*)    Hemoglobin 12.3 (*)    HCT 38.0 (*)    All other components within normal limits  DIFFERENTIAL - Abnormal; Notable for the following:    Neutro Abs 8.5 (*)    All other components within normal limits  COMPREHENSIVE METABOLIC PANEL - Abnormal; Notable for the following:    Potassium 3.1 (*)    Glucose, Bld 112 (*)    BUN 24 (*)  Creatinine, Ser 1.80 (*)    GFR calc non Af Amer 40 (*)    GFR calc Af Amer 46 (*)    All other components within normal limits  URINALYSIS, ROUTINE W REFLEX MICROSCOPIC - Abnormal; Notable for the following:    Protein, ur 30 (*)    Squamous Epithelial / LPF 0-5 (*)    All other components within normal limits  ETHANOL  PROTIME-INR  RAPID URINE DRUG SCREEN, HOSP PERFORMED  I-STAT TROPOININ, ED    EKG  EKG Interpretation  Date/Time:  Thursday September 07 2016 20:21:41 EST Ventricular Rate:  67 PR Interval:    QRS Duration: 123 QT Interval:  441 QTC Calculation: 466 R Axis:   16 Text Interpretation:  Sinus rhythm Nonspecific intraventricular conduction delay Consider anterior infarct Baseline wander in lead(s) V3 Confirmed by Lita Mains  MD, Simrat Kendrick ([id number]) on 09/07/2016 8:54:50 PM       Radiology Ct Head Wo Contrast  Result Date: 09/07/2016 CLINICAL DATA:  Right leg weakness.  History of stroke. EXAM: CT HEAD WITHOUT CONTRAST TECHNIQUE: Contiguous axial images were obtained from the base of the skull through the vertex without intravenous contrast. COMPARISON:  11/13/2006 CT FINDINGS: Brain: Encephalomalacia of the left  basal ganglia from chronic left basal ganglial hematoma. Slight ex vacuo dilatation of body of the left lateral ventricle. Chronic small vessel ischemic disease of periventricular white matter. No acute intracranial hemorrhage, midline shift or edema. No extra-axial fluid. Vascular: No hyperdense vessels or unexpected calcifications. Skull: Intact. Sinuses/Orbits: Clear mastoids. Moderate ethmoid and frontal sinus mucosal thickening. Mild bilateral maxillary and ethmoid sinus mucosal thickening. Other: None IMPRESSION: 1. Chronic small vessel ischemic disease of periventricular white matter. 2. 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 3. No acute intracranial abnormality. 4. Chronic paranasal sinusitis. Electronically Signed   By: Ashley Royalty M.D.   On: 09/07/2016 21:11    Procedures Procedures (including critical care time)  Medications Ordered in ED Medications  sodium chloride 0.9 % 1,000 mL with potassium chloride 80 mEq infusion ( Intravenous Given 09/07/16 2245)     Initial Impression / Assessment and Plan / ED Course  I have reviewed the triage vital signs and the nursing notes.  Pertinent labs & imaging results that were available during my care of the patient were reviewed by me and considered in my medical decision making (see chart for details).    CT without evidence of acute change. Hospitalist to see and admit. Discussed with Dr. Leonel Ramsay and will consult on patient. Initiated potassium replacement in the emergency department.  Final Clinical Impressions(s) / ED Diagnoses   Final diagnoses:  Weakness  Hypokalemia    New Prescriptions New Prescriptions   No medications on file     Julianne Rice, MD 09/07/16 2330

## 2016-09-08 ENCOUNTER — Observation Stay (HOSPITAL_COMMUNITY): Payer: Medicare Other

## 2016-09-08 ENCOUNTER — Inpatient Hospital Stay (HOSPITAL_COMMUNITY)
Admission: RE | Admit: 2016-09-08 | Discharge: 2016-09-17 | DRG: 057 | Disposition: A | Discharge status: Home / Self Care | Payer: Medicare Other | Source: Intra-hospital | Attending: Physical Medicine & Rehabilitation | Admitting: Physical Medicine & Rehabilitation

## 2016-09-08 ENCOUNTER — Encounter (HOSPITAL_COMMUNITY): Payer: Self-pay | Admitting: Internal Medicine

## 2016-09-08 DIAGNOSIS — E785 Hyperlipidemia, unspecified: Secondary | ICD-10-CM | POA: Diagnosis present

## 2016-09-08 DIAGNOSIS — E1122 Type 2 diabetes mellitus with diabetic chronic kidney disease: Secondary | ICD-10-CM | POA: Diagnosis present

## 2016-09-08 DIAGNOSIS — E1140 Type 2 diabetes mellitus with diabetic neuropathy, unspecified: Secondary | ICD-10-CM | POA: Diagnosis present

## 2016-09-08 DIAGNOSIS — I69351 Hemiplegia and hemiparesis following cerebral infarction affecting right dominant side: Principal | ICD-10-CM

## 2016-09-08 DIAGNOSIS — I129 Hypertensive chronic kidney disease with stage 1 through stage 4 chronic kidney disease, or unspecified chronic kidney disease: Secondary | ICD-10-CM | POA: Diagnosis present

## 2016-09-08 DIAGNOSIS — D62 Acute posthemorrhagic anemia: Secondary | ICD-10-CM | POA: Diagnosis not present

## 2016-09-08 DIAGNOSIS — G8111 Spastic hemiplegia affecting right dominant side: Secondary | ICD-10-CM | POA: Diagnosis not present

## 2016-09-08 DIAGNOSIS — I10 Essential (primary) hypertension: Secondary | ICD-10-CM

## 2016-09-08 DIAGNOSIS — Z8249 Family history of ischemic heart disease and other diseases of the circulatory system: Secondary | ICD-10-CM | POA: Diagnosis not present

## 2016-09-08 DIAGNOSIS — Z794 Long term (current) use of insulin: Secondary | ICD-10-CM | POA: Diagnosis not present

## 2016-09-08 DIAGNOSIS — Z7982 Long term (current) use of aspirin: Secondary | ICD-10-CM

## 2016-09-08 DIAGNOSIS — G253 Myoclonus: Secondary | ICD-10-CM

## 2016-09-08 DIAGNOSIS — N183 Chronic kidney disease, stage 3 unspecified: Secondary | ICD-10-CM

## 2016-09-08 DIAGNOSIS — I872 Venous insufficiency (chronic) (peripheral): Secondary | ICD-10-CM | POA: Diagnosis present

## 2016-09-08 DIAGNOSIS — F4323 Adjustment disorder with mixed anxiety and depressed mood: Secondary | ICD-10-CM | POA: Diagnosis not present

## 2016-09-08 DIAGNOSIS — M109 Gout, unspecified: Secondary | ICD-10-CM | POA: Diagnosis present

## 2016-09-08 DIAGNOSIS — Z79899 Other long term (current) drug therapy: Secondary | ICD-10-CM | POA: Diagnosis not present

## 2016-09-08 DIAGNOSIS — N1831 Chronic kidney disease, stage 3a: Secondary | ICD-10-CM

## 2016-09-08 DIAGNOSIS — N179 Acute kidney failure, unspecified: Secondary | ICD-10-CM | POA: Diagnosis not present

## 2016-09-08 DIAGNOSIS — R531 Weakness: Secondary | ICD-10-CM | POA: Diagnosis not present

## 2016-09-08 DIAGNOSIS — R29810 Facial weakness: Secondary | ICD-10-CM | POA: Diagnosis present

## 2016-09-08 DIAGNOSIS — R29898 Other symptoms and signs involving the musculoskeletal system: Secondary | ICD-10-CM | POA: Diagnosis not present

## 2016-09-08 DIAGNOSIS — G4730 Sleep apnea, unspecified: Secondary | ICD-10-CM | POA: Diagnosis present

## 2016-09-08 DIAGNOSIS — K219 Gastro-esophageal reflux disease without esophagitis: Secondary | ICD-10-CM | POA: Diagnosis present

## 2016-09-08 DIAGNOSIS — E1142 Type 2 diabetes mellitus with diabetic polyneuropathy: Secondary | ICD-10-CM | POA: Diagnosis present

## 2016-09-08 DIAGNOSIS — R569 Unspecified convulsions: Secondary | ICD-10-CM | POA: Diagnosis not present

## 2016-09-08 DIAGNOSIS — E876 Hypokalemia: Secondary | ICD-10-CM

## 2016-09-08 DIAGNOSIS — G4733 Obstructive sleep apnea (adult) (pediatric): Secondary | ICD-10-CM | POA: Diagnosis present

## 2016-09-08 DIAGNOSIS — K5909 Other constipation: Secondary | ICD-10-CM | POA: Diagnosis present

## 2016-09-08 DIAGNOSIS — Z823 Family history of stroke: Secondary | ICD-10-CM

## 2016-09-08 DIAGNOSIS — G459 Transient cerebral ischemic attack, unspecified: Principal | ICD-10-CM

## 2016-09-08 DIAGNOSIS — E119 Type 2 diabetes mellitus without complications: Secondary | ICD-10-CM | POA: Diagnosis not present

## 2016-09-08 LAB — CBC
HCT: 35.7 % — ABNORMAL LOW (ref 39.0–52.0)
Hemoglobin: 11.5 g/dL — ABNORMAL LOW (ref 13.0–17.0)
MCH: 27.4 pg (ref 26.0–34.0)
MCHC: 32.2 g/dL (ref 30.0–36.0)
MCV: 85.2 fL (ref 78.0–100.0)
Platelets: 205 10*3/uL (ref 150–400)
RBC: 4.19 MIL/uL — ABNORMAL LOW (ref 4.22–5.81)
RDW: 14.9 % (ref 11.5–15.5)
WBC: 9.9 10*3/uL (ref 4.0–10.5)

## 2016-09-08 LAB — LIPID PANEL
Cholesterol: 124 mg/dL (ref 0–200)
HDL: 20 mg/dL — ABNORMAL LOW (ref >40)
LDL Cholesterol: 51 mg/dL (ref 0–99)
Total CHOL/HDL Ratio: 6.2 RATIO
Triglycerides: 263 mg/dL — ABNORMAL HIGH (ref <150)
VLDL: 53 mg/dL — ABNORMAL HIGH (ref 0–40)

## 2016-09-08 LAB — PHOSPHORUS: Phosphorus: 3.3 mg/dL (ref 2.5–4.6)

## 2016-09-08 LAB — GLUCOSE, CAPILLARY
Glucose-Capillary: 114 mg/dL — ABNORMAL HIGH (ref 65–99)
Glucose-Capillary: 142 mg/dL — ABNORMAL HIGH (ref 65–99)

## 2016-09-08 LAB — CREATININE, SERUM
Creatinine, Ser: 1.76 mg/dL — ABNORMAL HIGH (ref 0.61–1.24)
GFR calc Af Amer: 47 mL/min — ABNORMAL LOW (ref >60)
GFR calc non Af Amer: 41 mL/min — ABNORMAL LOW (ref >60)

## 2016-09-08 LAB — MAGNESIUM: Magnesium: 1.8 mg/dL (ref 1.7–2.4)

## 2016-09-08 MED ORDER — HYDRALAZINE HCL 50 MG PO TABS
50.0000 mg | ORAL_TABLET | Freq: Three times a day (TID) | ORAL | Status: DC
  Administered 2016-09-08 – 2016-09-17 (×26): 50 mg via ORAL
  Filled 2016-09-08 (×26): qty 1

## 2016-09-08 MED ORDER — DOXAZOSIN MESYLATE 4 MG PO TABS
4.0000 mg | ORAL_TABLET | Freq: Every day | ORAL | Status: DC
  Administered 2016-09-08: 4 mg via ORAL
  Filled 2016-09-08: qty 1

## 2016-09-08 MED ORDER — ENOXAPARIN SODIUM 80 MG/0.8ML ~~LOC~~ SOLN: 0.5000 mg/kg | SUBCUTANEOUS | Status: DC

## 2016-09-08 MED ORDER — METFORMIN HCL 500 MG PO TABS
500.0000 mg | ORAL_TABLET | Freq: Two times a day (BID) | ORAL | Status: DC
  Administered 2016-09-08: 500 mg via ORAL
  Filled 2016-09-08: qty 1

## 2016-09-08 MED ORDER — ALLOPURINOL 100 MG PO TABS
100.0000 mg | ORAL_TABLET | Freq: Two times a day (BID) | ORAL | Status: DC
  Administered 2016-09-08 – 2016-09-17 (×18): 100 mg via ORAL
  Filled 2016-09-08 (×19): qty 1

## 2016-09-08 MED ORDER — PROCHLORPERAZINE EDISYLATE 5 MG/ML IJ SOLN: 5.0000 mg | Freq: Four times a day (QID) | INTRAMUSCULAR | Status: DC | PRN

## 2016-09-08 MED ORDER — SIMVASTATIN 40 MG PO TABS
40.0000 mg | ORAL_TABLET | Freq: Every day | ORAL | Status: DC
  Administered 2016-09-08: 40 mg via ORAL
  Filled 2016-09-08: qty 1

## 2016-09-08 MED ORDER — ACETAMINOPHEN 160 MG/5ML PO SOLN: 650.0000 mg | ORAL | Status: DC | PRN

## 2016-09-08 MED ORDER — ASPIRIN EC 81 MG PO TBEC
81.0000 mg | DELAYED_RELEASE_TABLET | Freq: Every evening | ORAL | Status: DC
  Administered 2016-09-08: 81 mg via ORAL
  Filled 2016-09-08: qty 1

## 2016-09-08 MED ORDER — SENNOSIDES-DOCUSATE SODIUM 8.6-50 MG PO TABS
2.0000 | ORAL_TABLET | Freq: Every day | ORAL | Status: DC
  Administered 2016-09-08 – 2016-09-16 (×9): 2 via ORAL
  Filled 2016-09-08 (×9): qty 2

## 2016-09-08 MED ORDER — ENOXAPARIN SODIUM 80 MG/0.8ML ~~LOC~~ SOLN
70.0000 mg | SUBCUTANEOUS | Status: DC
  Administered 2016-09-09 – 2016-09-16 (×8): 70 mg via SUBCUTANEOUS
  Filled 2016-09-08 (×10): qty 0.8

## 2016-09-08 MED ORDER — DIPHENHYDRAMINE HCL 12.5 MG/5ML PO ELIX: 12.5000 mg | ORAL_SOLUTION | Freq: Four times a day (QID) | ORAL | Status: DC | PRN

## 2016-09-08 MED ORDER — LISINOPRIL 20 MG PO TABS
20.0000 mg | ORAL_TABLET | Freq: Two times a day (BID) | ORAL | Status: DC
  Administered 2016-09-08 (×2): 20 mg via ORAL
  Filled 2016-09-08 (×2): qty 1

## 2016-09-08 MED ORDER — ALUM & MAG HYDROXIDE-SIMETH 200-200-20 MG/5ML PO SUSP: 30.0000 mL | ORAL | Status: DC | PRN

## 2016-09-08 MED ORDER — BISACODYL 5 MG PO TBEC: 5.0000 mg | DELAYED_RELEASE_TABLET | Freq: Every day | ORAL | Status: DC | PRN

## 2016-09-08 MED ORDER — GUAIFENESIN-DM 100-10 MG/5ML PO SYRP: 5.0000 mL | ORAL_SOLUTION | Freq: Four times a day (QID) | ORAL | Status: DC | PRN

## 2016-09-08 MED ORDER — FLEET ENEMA 7-19 GM/118ML RE ENEM
1.0000 | ENEMA | Freq: Once | RECTAL | Status: DC | PRN
  Filled 2016-09-08: qty 1

## 2016-09-08 MED ORDER — TRAZODONE HCL 50 MG PO TABS: 25.0000 mg | ORAL_TABLET | Freq: Every evening | ORAL | Status: DC | PRN

## 2016-09-08 MED ORDER — LORAZEPAM 2 MG/ML IJ SOLN
1.0000 mg | Freq: Once | INTRAMUSCULAR | Status: AC
  Administered 2016-09-08: 1 mg via INTRAVENOUS
  Filled 2016-09-08: qty 1

## 2016-09-08 MED ORDER — ACETAMINOPHEN 325 MG PO TABS: 325.0000 mg | ORAL_TABLET | ORAL | Status: DC | PRN

## 2016-09-08 MED ORDER — INSULIN ASPART 100 UNIT/ML ~~LOC~~ SOLN
0.0000 [IU] | Freq: Three times a day (TID) | SUBCUTANEOUS | Status: DC
  Administered 2016-09-09: 3 [IU] via SUBCUTANEOUS
  Administered 2016-09-09 – 2016-09-10 (×4): 2 [IU] via SUBCUTANEOUS
  Administered 2016-09-11: 3 [IU] via SUBCUTANEOUS
  Administered 2016-09-11 – 2016-09-17 (×14): 2 [IU] via SUBCUTANEOUS

## 2016-09-08 MED ORDER — HYDROCHLOROTHIAZIDE 25 MG PO TABS
25.0000 mg | ORAL_TABLET | Freq: Every day | ORAL | Status: DC
  Administered 2016-09-09 – 2016-09-17 (×9): 25 mg via ORAL
  Filled 2016-09-08 (×9): qty 1

## 2016-09-08 MED ORDER — LABETALOL HCL 300 MG PO TABS
300.0000 mg | ORAL_TABLET | Freq: Two times a day (BID) | ORAL | Status: DC
  Administered 2016-09-08 – 2016-09-17 (×18): 300 mg via ORAL
  Filled 2016-09-08 (×18): qty 1

## 2016-09-08 MED ORDER — CLONIDINE HCL 0.1 MG PO TABS
0.1000 mg | ORAL_TABLET | Freq: Every day | ORAL | Status: DC
  Administered 2016-09-08: 0.1 mg via ORAL
  Filled 2016-09-08: qty 1

## 2016-09-08 MED ORDER — ASPIRIN EC 81 MG PO TBEC
81.0000 mg | DELAYED_RELEASE_TABLET | Freq: Every evening | ORAL | Status: DC
  Administered 2016-09-09 – 2016-09-16 (×8): 81 mg via ORAL
  Filled 2016-09-08 (×8): qty 1

## 2016-09-08 MED ORDER — HYDRALAZINE HCL 50 MG PO TABS
50.0000 mg | ORAL_TABLET | Freq: Three times a day (TID) | ORAL | Status: DC
  Administered 2016-09-08 (×3): 50 mg via ORAL
  Filled 2016-09-08 (×3): qty 1

## 2016-09-08 MED ORDER — PROCHLORPERAZINE 25 MG RE SUPP: 12.5000 mg | Freq: Four times a day (QID) | RECTAL | Status: DC | PRN

## 2016-09-08 MED ORDER — CLONAZEPAM 0.5 MG PO TABS
0.2500 mg | ORAL_TABLET | Freq: Two times a day (BID) | ORAL | Status: DC
  Administered 2016-09-08 – 2016-09-10 (×4): 0.25 mg via ORAL
  Filled 2016-09-08 (×4): qty 1

## 2016-09-08 MED ORDER — INSULIN GLARGINE 100 UNIT/ML ~~LOC~~ SOLN
20.0000 [IU] | Freq: Two times a day (BID) | SUBCUTANEOUS | Status: DC
  Administered 2016-09-08 (×2): 20 [IU] via SUBCUTANEOUS
  Filled 2016-09-08 (×4): qty 0.2

## 2016-09-08 MED ORDER — DOXAZOSIN MESYLATE 4 MG PO TABS
4.0000 mg | ORAL_TABLET | Freq: Every day | ORAL | Status: DC
  Administered 2016-09-09 – 2016-09-17 (×9): 4 mg via ORAL
  Filled 2016-09-08 (×9): qty 1

## 2016-09-08 MED ORDER — LISINOPRIL 20 MG PO TABS
20.0000 mg | ORAL_TABLET | Freq: Two times a day (BID) | ORAL | Status: DC
  Administered 2016-09-08 – 2016-09-17 (×18): 20 mg via ORAL
  Filled 2016-09-08 (×18): qty 1

## 2016-09-08 MED ORDER — ACETAMINOPHEN 325 MG PO TABS: 650.0000 mg | ORAL_TABLET | ORAL | Status: DC | PRN

## 2016-09-08 MED ORDER — INSULIN ASPART 100 UNIT/ML ~~LOC~~ SOLN
0.0000 [IU] | Freq: Every day | SUBCUTANEOUS | Status: DC
  Administered 2016-09-15: 2 [IU] via SUBCUTANEOUS

## 2016-09-08 MED ORDER — ACETAMINOPHEN 650 MG RE SUPP: 650.0000 mg | RECTAL | Status: DC | PRN

## 2016-09-08 MED ORDER — PROCHLORPERAZINE MALEATE 5 MG PO TABS: 5.0000 mg | ORAL_TABLET | Freq: Four times a day (QID) | ORAL | Status: DC | PRN

## 2016-09-08 MED ORDER — STROKE: EARLY STAGES OF RECOVERY BOOK
Freq: Once | Status: AC
  Administered 2016-09-08: 02:00:00
  Filled 2016-09-08 (×2): qty 1

## 2016-09-08 MED ORDER — ENOXAPARIN SODIUM 40 MG/0.4ML ~~LOC~~ SOLN
40.0000 mg | Freq: Every day | SUBCUTANEOUS | Status: DC
  Administered 2016-09-08: 40 mg via SUBCUTANEOUS
  Filled 2016-09-08: qty 0.4

## 2016-09-08 MED ORDER — POTASSIUM CHLORIDE CRYS ER 20 MEQ PO TBCR
40.0000 meq | EXTENDED_RELEASE_TABLET | Freq: Every day | ORAL | Status: DC
  Administered 2016-09-09: 40 meq via ORAL
  Filled 2016-09-08: qty 2

## 2016-09-08 MED ORDER — INSULIN GLARGINE 100 UNIT/ML ~~LOC~~ SOLN
20.0000 [IU] | Freq: Two times a day (BID) | SUBCUTANEOUS | Status: DC
  Administered 2016-09-08 – 2016-09-17 (×17): 20 [IU] via SUBCUTANEOUS
  Filled 2016-09-08 (×18): qty 0.2

## 2016-09-08 MED ORDER — LABETALOL HCL 200 MG PO TABS
300.0000 mg | ORAL_TABLET | Freq: Two times a day (BID) | ORAL | Status: DC
  Administered 2016-09-08 (×2): 300 mg via ORAL
  Filled 2016-09-08 (×2): qty 2

## 2016-09-08 MED ORDER — POTASSIUM CHLORIDE CRYS ER 20 MEQ PO TBCR
40.0000 meq | EXTENDED_RELEASE_TABLET | Freq: Every day | ORAL | Status: DC
  Administered 2016-09-08: 40 meq via ORAL
  Filled 2016-09-08: qty 2

## 2016-09-08 MED ORDER — ALLOPURINOL 100 MG PO TABS
100.0000 mg | ORAL_TABLET | Freq: Two times a day (BID) | ORAL | Status: DC
  Administered 2016-09-08 (×2): 100 mg via ORAL
  Filled 2016-09-08 (×2): qty 1

## 2016-09-08 MED ORDER — SIMVASTATIN 40 MG PO TABS
40.0000 mg | ORAL_TABLET | Freq: Every day | ORAL | Status: DC
  Administered 2016-09-08 – 2016-09-16 (×9): 40 mg via ORAL
  Filled 2016-09-08 (×9): qty 1

## 2016-09-08 MED ORDER — HYDROCHLOROTHIAZIDE 25 MG PO TABS
25.0000 mg | ORAL_TABLET | Freq: Every day | ORAL | Status: DC
  Administered 2016-09-08: 25 mg via ORAL
  Filled 2016-09-08: qty 1

## 2016-09-08 MED ORDER — BISACODYL 10 MG RE SUPP
10.0000 mg | Freq: Every day | RECTAL | Status: DC | PRN
  Administered 2016-09-13: 10 mg via RECTAL
  Filled 2016-09-08: qty 1

## 2016-09-08 NOTE — H&P (Signed)
Physical Medicine and Rehabilitation Admission H&P     Chief Complaint  Patient presents with  . Myoclonus in setting of CVA with right spastic hemiparesis  HPI: Eduardo Young is a 61 y.o. 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 MRI with old infarct in left hemisphere affecting thalamus, basal gangli and deep white matter--unchanged. Patient with reports of intermittent uncontrolled movements of RLE followed by weakness. Therapy evaluations done and patient noted to have limitations in mobility and self care tasks. CIR recommended for follow up therapy.   Review of Systems  HENT: Negative for hearing loss and tinnitus.  Eyes: Negative for blurred vision and double vision.  Respiratory: Negative for cough and sputum production.  Cardiovascular: Negative for chest pain and palpitations.  Gastrointestinal: Positive for constipation ("tennis balls every 3 days" ). Negative for heartburn and nausea.  Genitourinary: Negative for dysuria and urgency.  Musculoskeletal: Negative for myalgias.  Skin: Negative for itching and rash.  Neurological: Positive for sensory change, speech change, focal weakness and weakness.  Psychiatric/Behavioral: The patient has insomnia (up at nighs and sleeps till afternoon).       Past Medical History:  Diagnosis Date  . Chronic venous insufficiency   . Diabetes mellitus   . GERD (gastroesophageal reflux disease)   . Gout   . Hemiparesis ([place])   . Hyperlipidemia   . Hypertension   . Obesity   . Renal calculi   . Sleep apnea, obstructive   . Stroke [facility])    [id number]        Past Surgical History:  Procedure Laterality Date  . LITHOTRIPSY    . NASAL POLYP EXCISION          Family History  Problem Relation Age of Onset  . Heart attack Mother   . Hypertension Mother   . Hyperlipidemia Mother   . Hypertension  Father   . Hyperlipidemia Father   . CVA Father   . Heart attack Sister   . Lung cancer Brother   . Heart attack Brother   . Ovarian cancer Sister   . Heart attack Sister   . CVA Sister    Social History:  Allergies: No Known Allergies        Medications Prior to Admission  Medication Sig Dispense Refill  . allopurinol (ZYLOPRIM) 100 MG tablet TAKE ONE TABLET BY MOUTH TWICE DAILY 60 tablet 11  . aspirin 81 MG tablet Take 81 mg by mouth every evening.     . bisacodyl (DULCOLAX) 5 MG EC tablet Take 5 mg by mouth daily as needed for constipation.    . cloNIDine (CATAPRES) 0.1 MG tablet Take 1 tablet (0.1 mg total) by mouth 3 (three) times daily. (Patient taking differently: Take 0.1 mg by mouth at bedtime. ) 90 tablet 3  . doxazosin (CARDURA) 4 MG tablet TAKE ONE TABLET BY MOUTH ONCE DAILY 30 tablet 11  . hydrALAZINE (APRESOLINE) 25 MG tablet Take 2 tablets (50 mg total) by mouth 3 (three) times daily. 180 tablet 11  . hydrochlorothiazide (HYDRODIURIL) 25 MG tablet TAKE ONE TABLET BY MOUTH ONCE DAILY 30 tablet 11  . insulin glargine (LANTUS) 100 UNIT/ML injection Inject 20 Units into the skin 2 (two) times daily.    Marland Kitchen labetalol (NORMODYNE) 300 MG tablet TAKE ONE TABLET BY MOUTH TWICE DAILY 60 tablet 11  . lisinopril (PRINIVIL,ZESTRIL) 20 MG tablet TAKE  ONE TABLET BY MOUTH TWICE DAILY 60 tablet 11  . metFORMIN (GLUCOPHAGE) 500 MG tablet TAKE ONE TABLET BY MOUTH TWICE DAILY WITH A MEAL 60 tablet 5  . Multiple Vitamin (MULTIVITAMIN WITH MINERALS) TABS tablet Take 1 tablet by mouth daily.    . simvastatin (ZOCOR) 40 MG tablet TAKE ONE TABLET BY MOUTH AT BEDTIME 30 tablet 3  . Insulin Glargine (BASAGLAR KWIKPEN) 100 UNIT/ML SOPN Inject 0.55 mLs (55 Units total) into the skin daily. (Patient not taking: Reported on 09/07/2016) 6 pen 3   Home:  [place] expects to be discharged to:: Private residence  Living Arrangements: Other relatives (sister)  Available Help at  Discharge: Family, Available PRN/intermittently (sister works 2, 24 hour shifts a week.)  Type of Home: House  Home Access: (a threshold step)  Home Layout: One level  Bathroom Shower/Tub: Administrator, Civil Service: Standard  Bathroom Accessibility: Yes  Home Equipment: Grab bars - toilet, Wheelchair - manual, Tub bench (hemi -walker)  Functional History:  Prior Function  Level of Independence: Needs assistance  Gait / Transfers Assistance Needed: uses hemi-walker to walk 5-10 steps to get into bedroom and bathroom, in w/c primarily  ADL's / Homemaking Assistance Needed: sister does cooking, assist with dressing and transfers out of shower. pt can microwave  Communication / Swallowing Assistance Needed: expressive difficulties at baeline  Functional Status:  Mobility:  Bed Mobility  Overal bed mobility: Needs Assistance  Bed Mobility: Supine to Sit  Supine to sit: Min assist  General bed mobility comments: OOB in chair  Transfers  Overall transfer level: Needs assistance  Equipment used: (hemi-walker)  Transfers: Sit to/from Stand  Sit to Stand: Supervision  Stand pivot transfers: Min assist  General transfer comment: limited due to lack of shoes  Ambulation/Gait  General Gait Details: unable at this time   ADL:  ADL  Overall ADL's : Needs assistance/impaired  Grooming: Set up  Upper Body Bathing: Set up, Sitting  Lower Body Bathing: Minimal assistance  Lower Body Bathing Details (indicate cue type and reason): for pericare  Lower Body Dressing: Minimal assistance  Toilet Transfer: Minimal assistance, Stand-pivot (with hemi walker)  Toileting- Clothing Manipulation and Hygiene: Moderate assistance  Toileting - Clothing Manipulation Details (indicate cue type and reason): Pt incontinenet during session  Functional mobility during ADLs: Minimal assistance (hemi walker; limited as pt does not have his shoes)  General ADL Comments: Pt does not have his shoes or his  glasses and states he does much better with shoes on and when he can see  Cognition:  Cognition  Overall Cognitive Status: Within Functional Limits for tasks assessed  Orientation Level: Oriented X4  Cognition  Arousal/Alertness: Awake/alert  Behavior During Therapy: WFL for tasks assessed/performed  Overall Cognitive Status: Within Functional Limits for tasks assessed  Blood pressure (!) 172/87, pulse 66, temperature 97 F (36.1 C), temperature source Oral, resp. rate 20, height 6' 3"  (1.905 m), weight 136 kg (299 lb 13.2 oz), SpO2 96 %.  Physical Exam  Nursing note and vitals reviewed.  Constitutional: He is oriented to person, place, and time. He appears well-developed and well-nourished.  HENT:  Head: Normocephalic and atraumatic.  Mouth/Throat: Oropharynx is clear and moist.  Eyes: Conjunctivae are normal. Pupils are equal, round, and reactive to light.  Neck: Normal range of motion. Neck supple.  Cardiovascular: Normal rate and regular rhythm.  Respiratory: Effort normal and breath sounds normal. No respiratory distress. He has no wheezes.  GI: Soft. Bowel  sounds are normal. He exhibits no distension.  Musculoskeletal: He exhibits edema.  BLE with stasis changes and healed lesions. Min edema RLE > LLE and right hand.  Neurological: He is alert and oriented to person, place, and time.  Right facial weakness with slow measured speech. Able to follow commands without difficulty. Moves LUE/LLE without difficulty.  LUE and LLE 5/5.  RUE 0/5 RLE HF 3/5 KE 3-/5 0/5 ADF/PF.  Myoclonus in RUE and RLE reproduced during exam. DTR's 3+ RUE and RLE. Plantar flexor contracture right heel.  Skin: Skin is warm and dry.  Psychiatric: He has a normal mood and affect. His behavior is normal.   Lab Results Last 48 Hours  Imaging Results (Last 48 hours)     Medical Problem List and Plan:  1. 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 DVT Prophylaxis/Anticoagulation: Pharmaceutical: Lovenox  3. Pain Management: N/A  4. Mood: LCSW to follow for evaluation and support.  5. Neuropsych: This patient is capable of making decisions on his own behalf.  6. Skin/Wound Care: routine pressure relief measures. TEDs for LE edema control.  7. Fluids/Electrolytes/Nutrition: Monitor I/O. Check lytes in am.  8. HTN: Monitor BP bid. On catapres, labetalol, hydralazine, HCTZ, cardura and lisinopril  9.T2DM with neuropathy and nephropathy: Monitor BS ac/hs. Continue lantus 20 units bid--metformin held due to CKD.  10. CKD: Monitor renal status serially. Baseline SCr- 1.7?  11. Hypokalemia: Will recheck in am. Encourage PO  12. Chronic constipation: will start senna at nights.  13. Spastic hemiparesis with myoclonus: Add low dose klonopin for myoclonus  -consider baclofen trial as well.    Post Admission Physician Evaluation:  1. Functional deficits secondary to spastic left hemiparesis/myoclonus. 2. Patient is admitted to  receive collaborative, interdisciplinary care between the physiatrist, rehab nursing staff, and therapy team. 3. Patient's level of medical complexity and substantial therapy needs in context of that medical necessity cannot be provided at a lesser intensity of care such as a SNF. 4. Patient has experienced substantial functional loss from his/her baseline which was documented above under the "Functional History" and "Functional Status" headings. Judging by the patient's diagnosis, physical exam, and functional history, the patient has potential for functional progress which will result in measurable gains while on inpatient rehab. These gains will be of substantial and practical use upon discharge in facilitating mobility and self-care at the household level. 5. Physiatrist will provide 24 hour management of medical needs as well as oversight of the therapy plan/treatment and provide guidance as appropriate regarding the interaction of the two. 6. The Preadmission Screening has been reviewed and patient status is unchanged unless otherwise stated above. 7. 24 hour rehab nursing will assist with bladder management, bowel management, safety, skin/wound care, disease management, medication administration, pain management and patient education and help integrate therapy concepts, techniques,education, etc. 8. PT will assess and treat for/with: Lower extremity strength, range of motion, stamina, balance, functional mobility, safety, adaptive techniques and equipment, NMR, spasticity mgt, orthotics, family ed. Goals are: supervision. 9. OT will assess and treat for/with: ADL's, functional mobility, safety, upper extremity strength, adaptive techniques and equipment, NMR, spasticity mgt, orthotics, family ed. Goals are: supervision to min assist. Therapy may proceed with showering this patient. 10. SLP will assess and treat for/with: n/a. Goals are: n/a. 11. Case Management and Social Worker will assess and treat  for psychological issues and discharge planning. 12. Team conference will be held weekly to assess progress toward goals and to determine barriers to discharge. 13. Patient will receive at least 3 hours of therapy per day at least 5 days per week. 14. ELOS: 8-15 days  15. Prognosis: excellent   Meredith Staggers, MD, [place] [facility]  09/08/2016   Bary Leriche, Hershal Coria  09/08/2016

## 2016-09-08 NOTE — Care Management Note (Signed)
Case Management Note  Patient Details  Name: Eduardo Young MRN: [id number] Date of Birth: 1956/05/19  Subjective/Objective:    Patient is in with TIA. He lives at home with his sister who is not able to provide 24 hour care. Pt states she works 24 hour shifts and he is home alone during this time. Pt states he has been self sufficient for the most part until yesterday. Pt does have rt sided weakness and uses walker and wheelchair at home. Pt does state he is unable to get into his bedroom and bathroom with his wheelchair but that they are looking into widening the doorways.  CM inquired about private duty sitters and home health. Pt wanting to discuss with his sister when she visits today.           Action/Plan: PT recommending CIR and OT HH with 24 hour supervision. CM following for d/c disposition and will meet with sister when she arrives.  Expected Discharge Date:                  Expected Discharge Plan:  [place]  In-House Referral:     Discharge planning Services  CM Consult  Post Acute Care Choice:    Choice offered to:     DME Arranged:    DME Agency:     HH Arranged:    [place] Agency:     Status of Service:  In process, will continue to follow  If discussed at Long Length of Stay Meetings, dates discussed:    Additional Comments:  Pollie Friar, RN 09/08/2016, 12:46 PM

## 2016-09-08 NOTE — Progress Notes (Signed)
EEG completed, results pending. 

## 2016-09-08 NOTE — Progress Notes (Signed)
Occupational Therapy Evaluation Patient Details Name: Eduardo Young MRN: [id number] DOB: August 21, 1955 Today's Date: 09/08/2016    History of Present Illness Eduardo Young is a 61 y.o. male with medical history significant of chronic venous insufficiency, type 2 diabetes, GERD, gout, CVA with residual right-sided hemiparesis, hyperlipidemia, hypertension, morbid obesity, obstructive sleep apnea not using CPAP since 2 years ago, urolithiasis who was brought to the emergency department with complaints of worsening right lower extremity weakness.. MRI (-) for acute findings.   Clinical Impression   PTA, pt lived at home with his sister and was modified independent primarily at w/c level, although used hemi-walker to walk into bedroom and bathroom as they are not w/c accessible. Sister assisted pt with tub transfers and dressing. Pt currently requiress mod A for hygiene after incontinent episode, otherwise, he appears close to his baseline level with ADL Pt able to complete sit to stand with superivsion at sink with supervsion, pushing up from his recliner to simulate using his w/c.  Unable to safely ambulate because he does not have his shoes or glasses which he states he needs to safely walk. If pt does not qualify for CIR, recommend DC home with [place] and [place]. Also recommend any community resources available to help with home modifications/make home w/c accessible. Will follow acutely to facilitate safe DC home.     Follow Up Recommendations  Supervision - Intermittent;Home health OT;Other (comment) (home health Aide)    Equipment Recommendations  3 in 1 bedside commode (wide BSC - unsure if pt has one)    Recommendations for Other Services       Precautions / Restrictions Precautions Precautions: Fall Precaution Comments: R hemiparesis (R inferiorly subluxed shoulder) Restrictions Weight Bearing Restrictions: No      Mobility Bed Mobility Overal bed mobility: Needs  Assistance Bed Mobility: Supine to Sit     Supine to sit: Min assist     General bed mobility comments: OOB in chair  Transfers Overall transfer level: Needs assistance Equipment used:  (hemi-walker) Transfers: Sit to/from Stand Sit to Stand: Supervision Stand pivot transfers: Min assist       General transfer comment: limited due to lack of shoes    Balance Overall balance assessment: Needs assistance Sitting-balance support: Feet supported;Single extremity supported Sitting balance-Leahy Scale: Good     Standing balance support: Single extremity supported;During functional activity Standing balance-Leahy Scale: Fair Standing balance comment: pt stood at sink for pericare                            ADL Overall ADL's : Needs assistance/impaired     Grooming: Set up   Upper Body Bathing: Set up;Sitting   Lower Body Bathing: Minimal assistance Lower Body Bathing Details (indicate cue type and reason): for pericare     Lower Body Dressing: Minimal assistance   Toilet Transfer: Minimal assistance;Stand-pivot (with hemi walker)   Toileting- Clothing Manipulation and Hygiene: Moderate assistance Toileting - Clothing Manipulation Details (indicate cue type and reason): Pt incontinenet during session  Pt states that is he can't make it to the bathroom, that he will have a BM in the bed and "clean up as best as he can"     Functional mobility during ADLs: Minimal assistance (hemi walker; limited to sit - stand as pt does not have his shoes) General ADL Comments: Pt does not have his shoes or his glasses and states he does much better with  shoes on and when he can see     Vision   Wears glasses "have to have them because I can't see anything"  Perception     Praxis      Pertinent Vitals/Pain Pain Assessment: No/denies pain     Hand Dominance Left   Extremity/Trunk Assessment Upper Extremity Assessment Upper Extremity Assessment: RUE  deficits/detail RUE Deficits / Details: flaccid, can not use functionally, subluxed shoulder. States he has sling at home RUE Coordination: decreased fine motor;decreased gross motor   Lower Extremity Assessment Lower Extremity Assessment: Defer to PT evaluation RLE Deficits / Details: + clonus with movement; R knee hyperextension with mobility   Cervical / Trunk Assessment Cervical / Trunk Assessment: Normal   Communication Communication Communication: Expressive difficulties   Cognition Arousal/Alertness: Awake/alert Behavior During Therapy: WFL for tasks assessed/performed Overall Cognitive Status: Within Functional Limits for tasks assessed                     General Comments   Pt appears aware of his needs and the obstacles he faces daily.    Exercises       Shoulder Instructions      Home Living Family/patient expects to be discharged to:: Private residence Living Arrangements: Other relatives (sister) Available Help at Discharge: Family;Available PRN/intermittently (sister works 2, 24 hour shifts a week.) Type of Home: House Home Access:  (a threshold step)     Home Layout: One level     Bathroom Shower/Tub: Tub/shower unit Shower/tub characteristics: Architectural technologist: Standard Bathroom Accessibility: Yes How Accessible: Accessible via walker Home Equipment: Grab bars - toilet;Wheelchair - manual;Tub bench (hemi -walker)          Prior Functioning/Environment Level of Independence: Needs assistance  Gait / Transfers Assistance Needed: uses hemi-walker to walk 5-10 steps to get into bedroom and bathroom, in w/c primarily ADL's / Homemaking Assistance Needed: sister does cooking, assist with dressing and transfers out of shower. pt can microwave Communication / Swallowing Assistance Needed: expressive difficulties at baeline          OT Problem List: Decreased strength;Impaired balance (sitting and/or standing);Decreased activity  tolerance;Decreased knowledge of use of DME or AE;Impaired sensation;Impaired tone;Obesity;Impaired UE functional use   OT Treatment/Interventions: Self-care/ADL training;Neuromuscular education;DME and/or AE instruction;Therapeutic activities;Patient/family education;Balance training    OT Goals(Current goals can be found in the care plan section) Acute Rehab OT Goals Patient Stated Goal: home OT Goal Formulation: With patient Time For Goal Achievement: 09/15/16 Potential to Achieve Goals: Good  OT Frequency: Min 2X/week   Barriers to D/C:            Co-evaluation              End of Session Equipment Utilized During Treatment: Gait belt (hemi walker) Nurse Communication: Mobility status (need for shoes)  Activity Tolerance: Patient tolerated treatment well Patient left: in chair;with call bell/phone within reach;with chair alarm set   Time: 1023-1050 OT Time Calculation (min): 27 min Charges:  OT General Charges $OT Visit: 1 Procedure OT Evaluation $OT Eval Moderate Complexity: 1 Procedure OT Treatments $Self Care/Home Management : 8-22 mins G-Codes: OT G-codes **NOT FOR INPATIENT CLASS** Functional Assessment Tool Used: clinical judgement Functional Limitation: Self care Self Care Current Status (D1761): At least 40 percent but less than 60 percent impaired, limited or restricted Self Care Goal Status (Y0737): At least 1 percent but less than 20 percent impaired, limited or restricted  [facility] 09/08/2016, 11:26 AM   Maurie Boettcher,  OT/L  [phone] 09/08/2016

## 2016-09-08 NOTE — H&P (Signed)
Physical Medicine and Rehabilitation Admission H&P    Chief Complaint  Patient presents with  . Myoclonus in setting of CVA with right spastic hemiparesis    HPI:  Eduardo Young is a 61 y.o. 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 MRI with old infarct in left hemisphere affecting thalamus, basal gangli and deep white matter--unchanged. Patient with reports of intermittent uncontrolled movements of RLE followed by weakness. Therapy evaluations done and patient noted to have limitations in mobility and self care tasks. CIR recommended for follow up therapy.     Review of Systems  HENT: Negative for hearing loss and tinnitus.   Eyes: Negative for blurred vision and double vision.  Respiratory: Negative for cough and sputum production.   Cardiovascular: Negative for chest pain and palpitations.  Gastrointestinal: Positive for constipation ("tennis balls every 3 days" ). Negative for heartburn and nausea.  Genitourinary: Negative for dysuria and urgency.  Musculoskeletal: Negative for myalgias.  Skin: Negative for itching and rash.  Neurological: Positive for sensory change, speech change, focal weakness and weakness.  Psychiatric/Behavioral: The patient has insomnia (up at  nighs and sleeps till afternoon).       Past Medical History:  Diagnosis Date  . Chronic venous insufficiency   . Diabetes mellitus   . GERD (gastroesophageal reflux disease)   . Gout   . Hemiparesis ([place])   . Hyperlipidemia   . Hypertension   . Obesity   . Renal calculi   . Sleep apnea, obstructive   . Stroke [facility])    [id number]    Past Surgical History:  Procedure Laterality Date  . LITHOTRIPSY    . NASAL POLYP EXCISION      Family History  Problem Relation Age of Onset  . Heart attack Mother   . Hypertension Mother   . Hyperlipidemia Mother   .  Hypertension Father   . Hyperlipidemia Father   . CVA Father   . Heart attack Sister   . Lung cancer Brother   . Heart attack Brother   . Ovarian cancer Sister   . Heart attack Sister   . CVA Sister     Social History:     Allergies: No Known Allergies    Medications Prior to Admission  Medication Sig Dispense Refill  . allopurinol (ZYLOPRIM) 100 MG tablet TAKE ONE TABLET BY MOUTH TWICE DAILY 60 tablet 11  . aspirin 81 MG tablet Take 81 mg by mouth every evening.     . bisacodyl (DULCOLAX) 5 MG EC tablet Take 5 mg by mouth daily as needed for constipation.    . cloNIDine (CATAPRES) 0.1 MG tablet Take 1 tablet (0.1 mg total) by mouth 3 (three) times daily. (Patient taking differently: Take 0.1 mg by mouth at bedtime. ) 90 tablet 3  . doxazosin (CARDURA) 4 MG tablet TAKE ONE TABLET BY MOUTH ONCE DAILY 30 tablet 11  . hydrALAZINE (APRESOLINE) 25 MG tablet Take 2 tablets (50 mg total) by mouth 3 (three) times daily. 180 tablet 11  . hydrochlorothiazide (HYDRODIURIL) 25 MG tablet TAKE ONE TABLET BY MOUTH ONCE DAILY 30 tablet 11  . insulin glargine (LANTUS) 100 UNIT/ML injection Inject 20 Units into the skin 2 (two) times daily.    Marland Kitchen labetalol (NORMODYNE) 300 MG tablet TAKE ONE TABLET BY MOUTH TWICE DAILY 60 tablet 11  . lisinopril (PRINIVIL,ZESTRIL) 20 MG tablet  TAKE ONE TABLET BY MOUTH TWICE DAILY 60 tablet 11  . metFORMIN (GLUCOPHAGE) 500 MG tablet TAKE ONE TABLET BY MOUTH TWICE DAILY WITH A MEAL 60 tablet 5  . Multiple Vitamin (MULTIVITAMIN WITH MINERALS) TABS tablet Take 1 tablet by mouth daily.    . simvastatin (ZOCOR) 40 MG tablet TAKE ONE TABLET BY MOUTH AT BEDTIME 30 tablet 3  . Insulin Glargine (BASAGLAR KWIKPEN) 100 UNIT/ML SOPN Inject 0.55 mLs (55 Units total) into the skin daily. (Patient not taking: Reported on 09/07/2016) 6 pen 3    Home: [place] expects to be discharged to:: Private residence Living Arrangements: Other relatives (sister) Available  Help at Discharge: Family, Available PRN/intermittently (sister works 2, 24 hour shifts a week.) Type of Home: House Home Access:  (a threshold step) Home Layout: One level Bathroom Shower/Tub: Chiropodist: Standard Bathroom Accessibility: Yes Home Equipment: Grab bars - toilet, Wheelchair - manual, Tub bench (hemi -walker)   Functional History: Prior Function Level of Independence: Needs assistance Gait / Transfers Assistance Needed: uses hemi-walker to walk 5-10 steps to get into bedroom and bathroom, in w/c primarily ADL's / Homemaking Assistance Needed: sister does cooking, assist with dressing and transfers out of shower. pt can microwave Communication / Swallowing Assistance Needed: expressive difficulties at baeline  Functional Status:  Mobility: Bed Mobility Overal bed mobility: Needs Assistance Bed Mobility: Supine to Sit Supine to sit: Min assist General bed mobility comments: OOB in chair Transfers Overall transfer level: Needs assistance Equipment used:  (hemi-walker) Transfers: Sit to/from Stand Sit to Stand: Supervision Stand pivot transfers: Min assist General transfer comment: limited due to lack of shoes Ambulation/Gait General Gait Details: unable at this time    ADL: ADL Overall ADL's : Needs assistance/impaired Grooming: Set up Upper Body Bathing: Set up, Sitting Lower Body Bathing: Minimal assistance Lower Body Bathing Details (indicate cue type and reason): for pericare Lower Body Dressing: Minimal assistance Toilet Transfer: Minimal assistance, Stand-pivot (with hemi walker) Toileting- Clothing Manipulation and Hygiene: Moderate assistance Toileting - Clothing Manipulation Details (indicate cue type and reason): Pt incontinenet during session Functional mobility during ADLs: Minimal assistance (hemi walker; limited as pt does not have his shoes) General ADL Comments: Pt does not have his shoes or his glasses and states he does  much better with shoes on and when he can see  Cognition: Cognition Overall Cognitive Status: Within Functional Limits for tasks assessed Orientation Level: Oriented X4 Cognition Arousal/Alertness: Awake/alert Behavior During Therapy: WFL for tasks assessed/performed Overall Cognitive Status: Within Functional Limits for tasks assessed    Blood pressure (!) 172/87, pulse 66, temperature 97 F (36.1 C), temperature source Oral, resp. rate 20, height 6' 3"  (1.905 m), weight 136 kg (299 lb 13.2 oz), SpO2 96 %. Physical Exam  Nursing note and vitals reviewed. Constitutional: He is oriented to person, place, and time. He appears well-developed and well-nourished.  HENT:  Head: Normocephalic and atraumatic.  Mouth/Throat: Oropharynx is clear and moist.  Eyes: Conjunctivae are normal. Pupils are equal, round, and reactive to light.  Neck: Normal range of motion. Neck supple.  Cardiovascular: Normal rate and regular rhythm.   Respiratory: Effort normal and breath sounds normal. No respiratory distress. He has no wheezes.  GI: Soft. Bowel sounds are normal. He exhibits no distension.  Musculoskeletal: He exhibits edema.  BLE with stasis changes and healed lesions. Min edema RLE > LLE and right hand.  Neurological: He is alert and oriented to person, place, and time.  Right  facial weakness with slow measured speech. Able to follow commands without difficulty. Moves LUE/LLE without difficulty.  LUE and LLE 5/5.  RUE 0/5 RLE HF 3/5 KE 3-/5 0/5 ADF/PF.  Myoclonus in RUE and RLE reproduced during exam. DTR's 3+ RUE and RLE. Plantar flexor contracture right heel.     Skin: Skin is warm and dry.  Psychiatric: He has a normal mood and affect. His behavior is normal.    Results for orders placed or performed during the hospital encounter of 09/07/16 (from the past 48 hour(s))  Ethanol     Status: None   Collection Time: 09/07/16  8:36 PM  Result Value Ref Range   Alcohol, Ethyl (B) <5 <5  mg/dL    Comment:        LOWEST DETECTABLE LIMIT FOR SERUM ALCOHOL IS 5 mg/dL FOR MEDICAL PURPOSES ONLY   Protime-INR     Status: None   Collection Time: 09/07/16  8:36 PM  Result Value Ref Range   Prothrombin Time 12.9 11.4 - 15.2 seconds   INR 0.98   APTT     Status: Abnormal   Collection Time: 09/07/16  8:36 PM  Result Value Ref Range   aPTT 39 (H) 24 - 36 seconds    Comment:        IF BASELINE aPTT IS ELEVATED, SUGGEST PATIENT RISK ASSESSMENT BE USED TO DETERMINE APPROPRIATE ANTICOAGULANT THERAPY.   CBC     Status: Abnormal   Collection Time: 09/07/16  8:36 PM  Result Value Ref Range   WBC 11.3 (H) 4.0 - 10.5 K/uL   RBC 4.44 4.22 - 5.81 MIL/uL   Hemoglobin 12.3 (L) 13.0 - 17.0 g/dL   HCT 38.0 (L) 39.0 - 52.0 %   MCV 85.6 78.0 - 100.0 fL   MCH 27.7 26.0 - 34.0 pg   MCHC 32.4 30.0 - 36.0 g/dL   RDW 15.0 11.5 - 15.5 %   Platelets 224 150 - 400 K/uL  Differential     Status: Abnormal   Collection Time: 09/07/16  8:36 PM  Result Value Ref Range   Neutrophils Relative % 77 %   Neutro Abs 8.5 (H) 1.7 - 7.7 K/uL   Lymphocytes Relative 13 %   Lymphs Abs 1.5 0.7 - 4.0 K/uL   Monocytes Relative 4 %   Monocytes Absolute 0.5 0.1 - 1.0 K/uL   Eosinophils Relative 6 %   Eosinophils Absolute 0.7 0.0 - 0.7 K/uL   Basophils Relative 0 %   Basophils Absolute 0.0 0.0 - 0.1 K/uL  Comprehensive metabolic panel     Status: Abnormal   Collection Time: 09/07/16  8:36 PM  Result Value Ref Range   Sodium 139 135 - 145 mmol/L   Potassium 3.1 (L) 3.5 - 5.1 mmol/L   Chloride 101 101 - 111 mmol/L   CO2 26 22 - 32 mmol/L   Glucose, Bld 112 (H) 65 - 99 mg/dL   BUN 24 (H) 6 - 20 mg/dL   Creatinine, Ser 1.80 (H) 0.61 - 1.24 mg/dL   Calcium 9.1 8.9 - 10.3 mg/dL   Total Protein 6.5 6.5 - 8.1 g/dL   Albumin 3.7 3.5 - 5.0 g/dL   AST 24 15 - 41 U/L   ALT 27 17 - 63 U/L   Alkaline Phosphatase 72 38 - 126 U/L   Total Bilirubin 0.8 0.3 - 1.2 mg/dL   GFR calc non Af Amer 40 (L) >60 mL/min    GFR calc Af Amer 46 (L) >60  mL/min    Comment: (NOTE) The eGFR has been calculated using the CKD EPI equation. This calculation has not been validated in all clinical situations. eGFR's persistently <60 mL/min signify possible Chronic Kidney Disease.    Anion gap 12 5 - 15  I-stat troponin, ED (not at [facility], [facility])     Status: None   Collection Time: 09/07/16  8:50 PM  Result Value Ref Range   Troponin i, poc 0.00 0.00 - 0.08 ng/mL   Comment 3            Comment: Due to the release kinetics of cTnI, a negative result within the first hours of the onset of symptoms does not rule out myocardial infarction with certainty. If myocardial infarction is still suspected, repeat the test at appropriate intervals.   Urine rapid drug screen (hosp performed)not at [facility]     Status: None   Collection Time: 09/07/16 10:43 PM  Result Value Ref Range   Opiates NONE DETECTED NONE DETECTED   Cocaine NONE DETECTED NONE DETECTED   Benzodiazepines NONE DETECTED NONE DETECTED   Amphetamines NONE DETECTED NONE DETECTED   Tetrahydrocannabinol NONE DETECTED NONE DETECTED   Barbiturates NONE DETECTED NONE DETECTED    Comment:        DRUG SCREEN FOR MEDICAL PURPOSES ONLY.  IF CONFIRMATION IS NEEDED FOR ANY PURPOSE, NOTIFY LAB WITHIN 5 DAYS.        LOWEST DETECTABLE LIMITS FOR URINE DRUG SCREEN Drug Class       Cutoff (ng/mL) Amphetamine      1000 Barbiturate      200 Benzodiazepine   161 Tricyclics       096 Opiates          300 Cocaine          300 THC              50   Urinalysis, Routine w reflex microscopic     Status: Abnormal   Collection Time: 09/07/16 10:43 PM  Result Value Ref Range   Color, Urine YELLOW YELLOW   APPearance CLEAR CLEAR   Specific Gravity, Urine 1.017 1.005 - 1.030   pH 5.0 5.0 - 8.0   Glucose, UA NEGATIVE NEGATIVE mg/dL   Hgb urine dipstick NEGATIVE NEGATIVE   Bilirubin Urine NEGATIVE NEGATIVE   Ketones, ur NEGATIVE NEGATIVE mg/dL   Protein, ur 30 (A) NEGATIVE  mg/dL   Nitrite NEGATIVE NEGATIVE   Leukocytes, UA NEGATIVE NEGATIVE   RBC / HPF 0-5 0 - 5 RBC/hpf   WBC, UA 0-5 0 - 5 WBC/hpf   Bacteria, UA NONE SEEN NONE SEEN   Squamous Epithelial / LPF 0-5 (A) NONE SEEN   Hyaline Casts, UA PRESENT   Glucose, capillary     Status: Abnormal   Collection Time: 09/08/16  1:17 AM  Result Value Ref Range   Glucose-Capillary 114 (H) 65 - 99 mg/dL  Lipid panel     Status: Abnormal   Collection Time: 09/08/16  2:51 AM  Result Value Ref Range   Cholesterol 124 0 - 200 mg/dL   Triglycerides 263 (H) <150 mg/dL   HDL 20 (L) >40 mg/dL   Total CHOL/HDL Ratio 6.2 RATIO   VLDL 53 (H) 0 - 40 mg/dL   LDL Cholesterol 51 0 - 99 mg/dL    Comment:        Total Cholesterol/HDL:CHD Risk Coronary Heart Disease Risk Table  Men   Women  1/2 Average Risk   3.4   3.3  Average Risk       5.0   4.4  2 X Average Risk   9.6   7.1  3 X Average Risk  23.4   11.0        Use the calculated Patient Ratio above and the CHD Risk Table to determine the patient's CHD Risk.        ATP III CLASSIFICATION (LDL):  <100     mg/dL   Optimal  100-129  mg/dL   Near or Above                    Optimal  130-159  mg/dL   Borderline  160-189  mg/dL   High  >190     mg/dL   Very High   CBC     Status: Abnormal   Collection Time: 09/08/16  2:51 AM  Result Value Ref Range   WBC 9.9 4.0 - 10.5 K/uL   RBC 4.19 (L) 4.22 - 5.81 MIL/uL   Hemoglobin 11.5 (L) 13.0 - 17.0 g/dL   HCT 35.7 (L) 39.0 - 52.0 %   MCV 85.2 78.0 - 100.0 fL   MCH 27.4 26.0 - 34.0 pg   MCHC 32.2 30.0 - 36.0 g/dL   RDW 14.9 11.5 - 15.5 %   Platelets 205 150 - 400 K/uL  Creatinine, serum     Status: Abnormal   Collection Time: 09/08/16  2:51 AM  Result Value Ref Range   Creatinine, Ser 1.76 (H) 0.61 - 1.24 mg/dL   GFR calc non Af Amer 41 (L) >60 mL/min   GFR calc Af Amer 47 (L) >60 mL/min    Comment: (NOTE) The eGFR has been calculated using the CKD EPI equation. This calculation has not  been validated in all clinical situations. eGFR's persistently <60 mL/min signify possible Chronic Kidney Disease.   Magnesium     Status: None   Collection Time: 09/08/16  2:51 AM  Result Value Ref Range   Magnesium 1.8 1.7 - 2.4 mg/dL  Phosphorus     Status: None   Collection Time: 09/08/16  2:51 AM  Result Value Ref Range   Phosphorus 3.3 2.5 - 4.6 mg/dL   Dg Chest 2 View  Result Date: 09/08/2016 CLINICAL DATA:  Left-sided weakness EXAM: CHEST  2 VIEW COMPARISON:  03/24/2013 FINDINGS: Cardiac shadow is stable. The lungs are well aerated bilaterally. No focal infiltrate or sizable effusion is seen. No bony abnormality is noted. IMPRESSION: No acute abnormality seen. Electronically Signed   By: Inez Catalina M.D.   On: 09/08/2016 07:44   Ct Head Wo Contrast  Result Date: 09/07/2016 CLINICAL DATA:  Right leg weakness.  History of stroke. EXAM: CT HEAD WITHOUT CONTRAST TECHNIQUE: Contiguous axial images were obtained from the base of the skull through the vertex without intravenous contrast. 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 Slight ex vacuo dilatation of body of the left lateral ventricle. Chronic small vessel ischemic disease of periventricular white matter. No acute intracranial hemorrhage, midline shift or edema. No extra-axial fluid. Vascular: No hyperdense vessels or unexpected calcifications. Skull: Intact. Sinuses/Orbits: Clear mastoids. Moderate ethmoid and frontal sinus mucosal thickening. Mild bilateral maxillary and ethmoid sinus mucosal thickening. Other: None IMPRESSION: 1. Chronic small vessel ischemic disease of periventricular white matter. 2. 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 3. No acute  intracranial abnormality. 4. Chronic paranasal sinusitis. Electronically Signed   By: Ashley Royalty M.D.   On: 09/07/2016 21:11   Mr Brain Wo Contrast  Result Date:  09/08/2016 CLINICAL DATA:  Acute onset of weakness last night. EXAM: MRI HEAD WITHOUT CONTRAST TECHNIQUE: Multiplanar, multiecho pulse sequences of the brain and surrounding structures were obtained without intravenous contrast. COMPARISON:  Head CT 09/07/2016 FINDINGS: Brain: Diffusion imaging does not show any acute or subacute infarction. Old Wallerian degeneration of the brainstem on the left. The cerebellum is normal. Cerebral hemispheres show moderate chronic small-vessel ischemic changes of the deep white matter with an old lacunar infarction in the right basal ganglia and an extensive old infarction in the left thalamus, posterior basal ganglia and radiating white matter tracts with atrophy and gliosis. Hemosiderin deposition is present throughout that region. No evidence of neoplastic mass lesion, acute hemorrhage, hydrocephalus or extra-axial collection. There is ex vacuo enlargement of the left lateral ventricle. No pituitary mass. Vascular: Major vessels at the base of the brain show flow. Skull and upper cervical spine: Negative Sinuses and orbits: Mucosal inflammatory changes of the paranasal sinuses. Orbits negative. Other: None significant IMPRESSION: Old infarction in the left hemisphere affecting the thalamus, basal ganglia and deep white matter. Unchanged from previous studies. No acute finding. Electronically Signed   By: Nelson Chimes M.D.   On: 09/08/2016 06:40       Medical Problem List and Plan: 1.  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  DVT Prophylaxis/Anticoagulation: Pharmaceutical: Lovenox 3. Pain Management: N/A 4. Mood: LCSW to follow for evaluation and support.  5. Neuropsych: This patient is capable of making decisions on his own behalf. 6. Skin/Wound Care: routine pressure relief measures. TEDs for LE edema control.  7. Fluids/Electrolytes/Nutrition:  Monitor I/O. Check lytes in am.  8. HTN: Monitor BP bid. On catapres, labetalol, hydralazine, HCTZ, cardura and lisinopril 9.T2DM with neuropathy and nephropathy: Monitor BS ac/hs. Continue lantus 20 units bid--metformin held due to CKD.  10. CKD:  Monitor renal status serially. Baseline SCr- 1.7?  11. Hypokalemia: Will recheck in am. Encourage PO  12. Chronic constipation: will start senna at nights.  13. Spastic hemiparesis with myoclonus: Add low dose klonopin for myoclonus  -consider baclofen trial as well.     Post Admission Physician Evaluation: 1. Functional deficits secondary  to spastic left hemiparesis/myoclonus. 2. Patient is admitted to receive collaborative, interdisciplinary care between the physiatrist, rehab nursing staff, and therapy team. 3. Patient's level of medical complexity and substantial therapy needs in context of that medical necessity cannot be provided at a lesser intensity of care such as a SNF. 4. Patient has experienced substantial functional loss from his/her baseline which was documented above under the "Functional History" and "Functional Status" headings.  Judging by the patient's diagnosis, physical exam, and functional history, the patient has potential for functional progress which will result in measurable gains while on inpatient rehab.  These gains will be of substantial and practical use upon discharge  in facilitating mobility and self-care at the household level. 5. Physiatrist will provide 24 hour management of medical needs as well as oversight of the therapy plan/treatment and provide guidance as appropriate regarding the interaction of the two. 6. The Preadmission Screening has been reviewed and patient status is unchanged unless otherwise stated above. 7. 24 hour rehab nursing will assist with bladder management, bowel management, safety, skin/wound care, disease management, medication  administration, pain management and patient education  and help  integrate therapy concepts, techniques,education, etc. 8. PT will assess and treat for/with: Lower extremity strength, range of motion, stamina, balance, functional mobility, safety, adaptive techniques and equipment, NMR, spasticity mgt, orthotics, family ed.   Goals are: supervision. 9. OT will assess and treat for/with: ADL's, functional mobility, safety, upper extremity strength, adaptive techniques and equipment, NMR, spasticity mgt, orthotics, family ed.   Goals are: supervision to min assist. Therapy may proceed with showering this patient. 10. SLP will assess and treat for/with: n/a.  Goals are: n/a. 11. Case Management and Social Worker will assess and treat for psychological issues and discharge planning. 12. Team conference will be held weekly to assess progress toward goals and to determine barriers to discharge. 13. Patient will receive at least 3 hours of therapy per day at least 5 days per week. 14. ELOS: 8-15 days        15. Prognosis:  excellent     Meredith Staggers, MD, [facility] 09/08/2016  Bary Leriche, Hershal Coria 09/08/2016

## 2016-09-08 NOTE — Evaluation (Addendum)
Physical Therapy Evaluation Patient Details Name: Eduardo Young MRN: [id number] DOB: 1956/05/24 Today's Date: 09/08/2016   History of Present Illness  Eduardo Young is a 61 y.o. male with medical history significant of chronic venous insufficiency, type 2 diabetes, GERD, gout, CVA with residual right-sided hemiparesis, hyperlipidemia, hypertension, morbid obesity, obstructive sleep apnea not using CPAP since 2 years ago, urolithiasis who was brought to the emergency department with complaints of worsening right lower extremity weakness.  Clinical Impression  Pt presenting with decreased balance, R UE flaccidity, decreased R LE strength, and impaired co-ordination. PTA pt was able to transfers self in/out of w/c, indep at w/c level and could amb with hemi-walker 10-12 steps into room and bathroom. Pt now required modAx2 for sit to stand and std pvt. Recommend CIR for aggressive therapy to achieve mod I w/c level of function for safe transition home.    Follow Up Recommendations CIR    Equipment Recommendations       Recommendations for Other Services Rehab consult     Precautions / Restrictions Precautions Precautions: Fall Precaution Comments: R hemiparesis Restrictions Weight Bearing Restrictions: No      Mobility  Bed Mobility Overal bed mobility: Needs Assistance Bed Mobility: Supine to Sit     Supine to sit: Min assist     General bed mobility comments: labored effort, definite use of bed rail, unable to use R UE functionally  Transfers Overall transfer level: Needs assistance Equipment used: Rolling walker (2 wheeled) (to mimic hemi-walker) Transfers: Sit to/from [facility] Sit to Stand: Mod assist;+2 physical assistance Stand pivot transfers: Mod assist;+2 physical assistance       General transfer comment: modAx2 for initial power up, R knee blocked. R UE shaking, pt with increased fear of falling and decreased trust in R LE. Pt given RW to  mimic hemi walker and pt able to complete 3 steps to chair with max v/c's and minAx2. pt did not clear L foot but pivoted on ball of foot. pt fatigued quickly  Ambulation/Gait             General Gait Details: unable at this time  Stairs            Wheelchair Mobility    Modified Rankin (Stroke Patients Only)       Balance Overall balance assessment: Needs assistance Sitting-balance support: Feet supported;Single extremity supported Sitting balance-Leahy Scale: Fair     Standing balance support: Single extremity supported Standing balance-Leahy Scale: Poor Standing balance comment: pt dependent on PT and walker to maintain standing                             Pertinent Vitals/Pain Pain Assessment: No/denies pain    Home Living Family/patient expects to be discharged to:: Private residence Living Arrangements: Other relatives (sister) Available Help at Discharge: Family;Available PRN/intermittently (sister works 2, 24 hour shifts a week.) Type of Home: House Home Access:  (a threshold step)     Home Layout: One level Home Equipment: Grab bars - toilet;Wheelchair - manual;Shower seat (hemi -walker)      Prior Function Level of Independence: Needs assistance   Gait / Transfers Assistance Needed: uses hemi-walker to walk 5-10 steps to get into bedroom and bathroom, in w/c primarily  ADL's / Homemaking Assistance Needed: sister does cooking, assist with dressing and transfers out of shower. pt can microwave        Hand Dominance  Dominant Hand: Left    Extremity/Trunk Assessment   Upper Extremity Assessment Upper Extremity Assessment: RUE deficits/detail RUE Deficits / Details: flaccid, minimal initiation t/o UE, can not use functionally    Lower Extremity Assessment Lower Extremity Assessment: RLE deficits/detail RLE Deficits / Details: grossly 2+/5    Cervical / Trunk Assessment Cervical / Trunk Assessment: Normal  Communication    Communication: Expressive difficulties  Cognition Arousal/Alertness: Awake/alert Behavior During Therapy: WFL for tasks assessed/performed Overall Cognitive Status: Within Functional Limits for tasks assessed                      General Comments      Exercises     Assessment/Plan    PT Assessment Patient needs continued PT services  PT Problem List Decreased strength;Decreased range of motion;Decreased activity tolerance;Decreased balance;Decreased mobility;Decreased safety awareness;Decreased coordination;Impaired sensation;Impaired tone          PT Treatment Interventions DME instruction;Gait training;Stair training;Functional mobility training;Therapeutic activities;Therapeutic exercise;Balance training;Neuromuscular re-education    PT Goals (Current goals can be found in the Care Plan section)  Acute Rehab PT Goals Patient Stated Goal: home PT Goal Formulation: With patient Time For Goal Achievement: 09/15/16 Potential to Achieve Goals: Good    Frequency Min 5X/week   Barriers to discharge Decreased caregiver support sister works 24 hours on, 72 hours off    Co-evaluation               End of Session Equipment Utilized During Treatment: Gait belt Activity Tolerance:  (limited by anxiety/fear of falling) Patient left: in chair;with chair alarm set Nurse Communication: Mobility status    Functional Assessment Tool Used: clinical judgmenet Functional Limitation: Mobility: Walking and moving around Mobility: Walking and Moving Around Current Status [phone]): At least 40 percent but less than 60 percent impaired, limited or restricted Mobility: Walking and Moving Around Goal Status [phone]): At least 20 percent but less than 40 percent impaired, limited or restricted    Time: 0814-0900 PT Time Calculation (min) (ACUTE ONLY): 46 min   Charges:   PT Evaluation $PT Eval Moderate Complexity: 1 Procedure PT Treatments $Therapeutic Activity: 23-37  mins   PT G Codes:   PT G-Codes **NOT FOR INPATIENT CLASS** Functional Assessment Tool Used: clinical judgmenet Functional Limitation: Mobility: Walking and moving around Mobility: Walking and Moving Around Current Status ([id number]): At least 40 percent but less than 60 percent impaired, limited or restricted Mobility: Walking and Moving Around Goal Status [phone]): At least 20 percent but less than 40 percent impaired, limited or restricted    Berline Lopes 09/08/2016, 9:38 AM   Kittie Plater, PT, DPT Pager #: [phone] Office #: [phone]

## 2016-09-08 NOTE — Procedures (Signed)
CLINICAL HISTORY This is a 61yo M with prior stroke and residual right hemiparesis (arm > leg) with shaking episodes concerning for seizures. There are no relevant meds.   TECHNIQUE This is a routine inpatient EEG done with standard technique.  The patient is listed as being awake and drowsy during the recording.  Activating procedures were not done.  DESCRIPTION Background activity shows 8-9 Hz alpha rhythm that is symmetric.  The background is slightly reduced in amplitude and slightly disorganized at times.  In addition, there is frontal beta activity seen on occasion.  Artifact from muscle/movement along with eye-blink and noise is seen throughout the trace.  The patient does become drowsy at times with increased generalized slowing.  IMPRESSION This is a within-normal-limits EEG.  INTERPRETATION The presence of frontal beta activity may be secondary to medication effect.  There is no evidence for focal, lateralizing or epileptiform activity at this time.  An essentially normal EEG does not exclude the possibility of seizures.  Dr. Lawana Pai [facility] [phone]  09/08/2016, 6:11 PM

## 2016-09-08 NOTE — Discharge Summary (Signed)
Physician Discharge Summary  [name] TFT:[id number] DOB: 05/12/1956 DOA: 09/07/2016  PCP: Odette Fraction, MD  Admit date: 09/07/2016 Discharge date: 09/08/2016  Admitted From: Home  Disposition:  CIR  Recommendations for Outpatient Follow-up:  1. Follow up with PCP in 1-2 weeks 2. Please obtain BMP/CBC in one week 3. Please follow up on the following pending results:    Discharge Condition: Stable.  CODE STATUS: full code.  Diet recommendation: Carb Modified  Brief/Interim Summary: Eduardo Young Young is Eduardo Young 61 y.o. male with medical history significant of chronic venous insufficiency, type 2 diabetes, GERD, gout, CVA with residual right-sided hemiparesis, hyperlipidemia, hypertension, morbid obesity, obstructive sleep apnea not using CPAP since 2 years ago, urolithiasis who was brought to the emergency department with complaints of worsening right lower extremity weakness.  Per patient, he noticed this while he was going to the bathroom. He mentions that he sat down on the toilet and was unable to get up. He states that he gets some of these episodes several times Eduardo Young week, but never has been this intense. Today EMS had to be called since he was unable to stand on his own. He and his sister deny headache, vision changes, or slurred speech. He denies chest pain, palpitations, dizziness, diaphoresis or pitting edema of the lower extremities. His sister mentions that he frequently has pulses while sleeping and has not used his CPAP in Eduardo Young while.    Discharge Diagnoses:  Principal Problem:   TIA (transient ischemic attack) Active Problems:   History of cerebral infarction ([place])   DM (diabetes mellitus) ([place])   HLD (hyperlipidemia)   HTN (hypertension)   Sleep apnea   Weakness   Myoclonus   Seizures ([facility])  Worsening right side weakness;  Hemoglobin A1c pending  MRI negative for acute stroke.  EEG per neurology. Will try to get EEG done. Discussed with neurology they could follow  results. If EEG positive for seizure patient Will need to be started on Keppra.  Please follow EEG and discussed results with neurology.   History of cerebral infarction ([place]) Right-sided hemiparesis. Supportive care. PT/OT evaluation in Eduardo Young.m.[facility] Kitchen PT recommend CIR.   DM (diabetes mellitus) ([place]) Carbohydrate modified diet. Continue long-acting insulin. CBG monitoring with regular insulin sliding scale. Will discontinue metformin due to CKD.     HLD (hyperlipidemia)  Continue simvastatin 40 mg by mouth at bedtime. Carb modified diet.     HTN (hypertension) Continue Cardura 4 mg by mouth daily. Continue hydralazine 50 mg by mouth 3 times Eduardo Young day. Continue lisinopril 20 mg by mouth daily. Monitor renal function on lisinopril/  Continue labetalol 300 mg by mouth twice Eduardo Young day Continue hydrochlorothiazide 25 mg by mouth daily during Takes clonidine daily     Sleep apnea Recommend CPAP daily at bedtime.   Discharge Instructions    No current facility-administered medications on file prior to encounter.    Current Outpatient Prescriptions on File Prior to Encounter  Medication Sig Dispense Refill  . allopurinol (ZYLOPRIM) 100 MG tablet TAKE ONE TABLET BY MOUTH TWICE DAILY 60 tablet 11  . aspirin 81 MG tablet Take 81 mg by mouth every evening.     . bisacodyl (DULCOLAX) 5 MG EC tablet Take 5 mg by mouth daily as needed for constipation.    . cloNIDine (CATAPRES) 0.1 MG tablet Take 1 tablet (0.1 mg total) by mouth 3 (three) times daily. (Patient taking differently: Take 0.1 mg by mouth at bedtime. ) 90 tablet 3  . doxazosin (CARDURA) 4 MG  tablet TAKE ONE TABLET BY MOUTH ONCE DAILY 30 tablet 11  . hydrALAZINE (APRESOLINE) 25 MG tablet Take 2 tablets (50 mg total) by mouth 3 (three) times daily. 180 tablet 11  . hydrochlorothiazide (HYDRODIURIL) 25 MG tablet TAKE ONE TABLET BY MOUTH ONCE DAILY 30 tablet 11  . labetalol (NORMODYNE) 300 MG tablet TAKE ONE TABLET BY MOUTH TWICE DAILY 60  tablet 11  . lisinopril (PRINIVIL,ZESTRIL) 20 MG tablet TAKE ONE TABLET BY MOUTH TWICE DAILY 60 tablet 11  . metFORMIN (GLUCOPHAGE) 500 MG tablet TAKE ONE TABLET BY MOUTH TWICE DAILY WITH Eduardo Young MEAL 60 tablet 5  . Multiple Vitamin (MULTIVITAMIN WITH MINERALS) TABS tablet Take 1 tablet by mouth daily.    . simvastatin (ZOCOR) 40 MG tablet TAKE ONE TABLET BY MOUTH AT BEDTIME 30 tablet 3  . Insulin Glargine (BASAGLAR KWIKPEN) 100 UNIT/ML SOPN Inject 0.55 mLs (55 Units total) into the skin daily. (Patient not taking: Reported on 09/07/2016) 6 pen 3     No Known Allergies  Consultations:  Neurology    Procedures/Studies: Dg Chest 2 View  Result Date: 09/08/2016 CLINICAL DATA:  Left-sided weakness EXAM: CHEST  2 VIEW COMPARISON:  03/24/2013 FINDINGS: Cardiac shadow is stable. The lungs are well aerated bilaterally. No focal infiltrate or sizable effusion is seen. No bony abnormality is noted. IMPRESSION: No acute abnormality seen. Electronically Signed   By: Inez Catalina M.D.   On: 09/08/2016 07:44   Ct Head Wo Contrast  Result Date: 09/07/2016 CLINICAL DATA:  Right leg weakness.  History of stroke. EXAM: CT HEAD WITHOUT CONTRAST TECHNIQUE: Contiguous axial images were obtained from the base of the skull through the vertex without intravenous contrast. 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 Slight ex vacuo dilatation of body of the left lateral ventricle. Chronic small vessel ischemic disease of periventricular white matter. No acute intracranial hemorrhage, midline shift or edema. No extra-axial fluid. Vascular: No hyperdense vessels or unexpected calcifications. Skull: Intact. Sinuses/Orbits: Clear mastoids. Moderate ethmoid and frontal sinus mucosal thickening. Mild bilateral maxillary and ethmoid sinus mucosal thickening. Other: None IMPRESSION: 1. Chronic small vessel ischemic disease of periventricular white  matter. 2. 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 3. No acute intracranial abnormality. 4. Chronic paranasal sinusitis. Electronically Signed   By: Ashley Royalty M.D.   On: 09/07/2016 21:11   Mr Brain Wo Contrast  Result Date: 09/08/2016 CLINICAL DATA:  Acute onset of weakness last night. EXAM: MRI HEAD WITHOUT CONTRAST TECHNIQUE: Multiplanar, multiecho pulse sequences of the brain and surrounding structures were obtained without intravenous contrast. COMPARISON:  Head CT 09/07/2016 FINDINGS: Brain: Diffusion imaging does not show any acute or subacute infarction. Old Wallerian degeneration of the brainstem on the left. The cerebellum is normal. Cerebral hemispheres show moderate chronic small-vessel ischemic changes of the deep white matter with an old lacunar infarction in the right basal ganglia and an extensive old infarction in the left thalamus, posterior basal ganglia and radiating white matter tracts with atrophy and gliosis. Hemosiderin deposition is present throughout that region. No evidence of neoplastic mass lesion, acute hemorrhage, hydrocephalus or extra-axial collection. There is ex vacuo enlargement of the left lateral ventricle. No pituitary mass. Vascular: Major vessels at the base of the brain show flow. Skull and upper cervical spine: Negative Sinuses and orbits: Mucosal inflammatory changes of the paranasal sinuses. Orbits negative. Other: None significant IMPRESSION: Old infarction in the left hemisphere affecting the thalamus, basal ganglia  and deep white matter. Unchanged from previous studies. No acute finding. Electronically Signed   By: Nelson Chimes M.D.   On: 09/08/2016 06:40       Subjective: He is feeling ok, right side weakness persist   Discharge Exam: Vitals:   09/08/16 1049 09/08/16 1249  BP: 140/78 (!) 172/87  Pulse: 76 66  Resp: 16 20  Temp: 98.2 F (36.8 C) 97 F (36.1 C)   Vitals:   09/08/16 0710 09/08/16 0849 09/08/16 1049 09/08/16  1249  BP: (!) 158/90 140/75 140/78 (!) 172/87  Pulse: 65 89 76 66  Resp: 18 20 16 20   Temp:  98.6 F (37 C) 98.2 F (36.8 C) 97 F (36.1 C)  TempSrc:  Oral Oral Oral  SpO2: 96% 94% 96% 96%  Weight:      Height:        General: Pt is alert, awake, not in acute distress Cardiovascular: RRR, S1/S2 +, no rubs, no gallops Respiratory: CTA bilaterally, no wheezing, no rhonchi Abdominal: Soft, NT, ND, bowel sounds + Extremities: no edema, no cyanosis    The results of significant diagnostics from this hospitalization (including imaging, microbiology, ancillary and laboratory) are listed below for reference.     Microbiology: No results found for this or any previous visit (from the past 240 hour(s)).   Labs: BNP (last 3 results) No results for input(s): BNP in the last 8760 hours. Basic Metabolic Panel:  Recent Labs Lab 09/07/16 2036 09/08/16 0251  NA 139  --   K 3.1*  --   CL 101  --   CO2 26  --   GLUCOSE 112*  --   BUN 24*  --   CREATININE 1.80* 1.76*  CALCIUM 9.1  --   MG  --  1.8  PHOS  --  3.3   Liver Function Tests:  Recent Labs Lab 09/07/16 2036  AST 24  ALT 27  ALKPHOS 72  BILITOT 0.8  PROT 6.5  ALBUMIN 3.7   No results for input(s): LIPASE, AMYLASE in the last 168 hours. No results for input(s): AMMONIA in the last 168 hours. CBC:  Recent Labs Lab 09/07/16 2036 09/08/16 0251  WBC 11.3* 9.9  NEUTROABS 8.5*  --   HGB 12.3* 11.5*  HCT 38.0* 35.7*  MCV 85.6 85.2  PLT 224 205   Cardiac Enzymes: No results for input(s): CKTOTAL, CKMB, CKMBINDEX, TROPONINI in the last 168 hours. BNP: Invalid input(s): POCBNP CBG:  Recent Labs Lab 09/08/16 0117  GLUCAP 114*   D-Dimer No results for input(s): DDIMER in the last 72 hours. Hgb A1c No results for input(s): HGBA1C in the last 72 hours. Lipid Profile  Recent Labs  09/08/16 0251  CHOL 124  HDL 20*  LDLCALC 51  TRIG 263*  CHOLHDL 6.2   Thyroid function studies No results for  input(s): TSH, T4TOTAL, T3FREE, THYROIDAB in the last 72 hours.  Invalid input(s): FREET3 Anemia work up No results for input(s): VITAMINB12, FOLATE, FERRITIN, TIBC, IRON, RETICCTPCT in the last 72 hours. Urinalysis    Component Value Date/Time   COLORURINE YELLOW 09/07/2016 2243   APPEARANCEUR CLEAR 09/07/2016 2243   LABSPEC 1.017 09/07/2016 2243   PHURINE 5.0 09/07/2016 2243   GLUCOSEU NEGATIVE 09/07/2016 2243   HGBUR NEGATIVE 09/07/2016 2243   BILIRUBINUR NEGATIVE 09/07/2016 2243   KETONESUR NEGATIVE 09/07/2016 2243   PROTEINUR 30 (Eduardo Young) 09/07/2016 2243   UROBILINOGEN 1.0 11/15/2013 0952   NITRITE NEGATIVE 09/07/2016 [place] 09/07/2016 2243  Sepsis Labs Invalid input(s): PROCALCITONIN,  WBC,  LACTICIDVEN Microbiology No results found for this or any previous visit (from the past 240 hour(s)).   Time coordinating discharge: Over 30 minutes  SIGNED:   Elmarie Shiley, MD  [facility] 09/08/2016, 3:00 PM Pager [phone]  If 7PM-7AM, please contact night-coverage [email or web address] Password [email or web address]

## 2016-09-08 NOTE — Progress Notes (Signed)
Inpatient Rehabilitation  Patient was screened by Gunnar Fusi for appropriateness for an Inpatient Acute Rehab consult.  At this time we are recommending an Inpatient Rehab consult.  MD notified; please order if you are agreeable.    Carmelia Roller., CCC/SLP Admission Coordinator  [place]  Cell [phone]

## 2016-09-08 NOTE — Progress Notes (Signed)
RT Note:  Patient refuses CPAP.  States that he has not worn one in years because it makes him feel like he is smothered.  Patient refused hospital options.  Placed on 2lpm [facility] for QHS.  Pt will call if he changes his mind.  HR: 70 RR: 20 SpO2: 98% RA

## 2016-09-08 NOTE — Progress Notes (Signed)
OT Cancellation Note  Patient Details Name: Eduardo Young MRN: [id number] DOB: September 03, 1955   Cancelled Treatment:    Reason Eval/Treat Not Completed: Patient at procedure or test/ unavailable. MRI  [place], OT/L  [phone] 09/08/2016 09/08/2016, 8:59 AM

## 2016-09-08 NOTE — PMR Pre-admission (Signed)
PMR Admission Coordinator Pre-Admission Assessment  Patient: Eduardo Young is an 61 y.o., male MRN: [id number] DOB: 07-Nov-1955 Height: 6' 3"  (190.5 cm) Weight: 136 kg (299 lb 13.2 oz)              Insurance Information HMO:     PPO:      PCP:      IPA:      80/20:      OTHER:  PRIMARY: Medicare A & B       Policy#: [id number]      Subscriber: Self CM Name:       Phone#:      Fax#:  Pre-Cert#: eligible       Employer:  Benefits:  Phone #:      Name:  Eff. 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 According to his sister he has been slowly declining for a year or more. Developed increased involuntary movements in his right leg followed by a period of lethargy which brought up question of seizures. In speaking with his sister the spasms/"jerks" are not too dissimilar from what has happened before. I was able to reproduce the same myoclonic jerks at bedside (sister witnessed and affirmed).   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 MRI with old infarct in left hemisphere affecting thalamus, basal gangli and deep white matter--unchanged. Patient with reports of intermittent uncontrolled movements of RLE followed by weakness. Therapy  evaluations done and patient noted to have limitations in mobility and self care tasks. CIR recommended for follow up therapy and patient admitted 09/08/16.    NIH Total: 8    Past Medical History  Past Medical History:  Diagnosis Date  . Chronic venous insufficiency   . Diabetes mellitus   . GERD (gastroesophageal reflux disease)   . Gout   . Hemiparesis ([place])   . Hyperlipidemia   . Hypertension   . Obesity   . Renal calculi   . Sleep apnea, obstructive   . Stroke [facility])    [id number]    Family History  family history includes CVA in his father and sister; Heart attack in his brother, mother, sister, and sister; Hyperlipidemia in his father and mother; Hypertension in his father and mother; Lung cancer in his brother; Ovarian cancer in his sister.  Prior Rehab/Hospitalizations:  Has the patient had major surgery during 100 days prior to admission? No  Current Medications   Current Facility-Administered Medications:  .  acetaminophen (TYLENOL) tablet 650 mg, 650 mg, Oral, Q4H PRN **OR** acetaminophen (TYLENOL) solution 650 mg, 650 mg, Per Tube,  Q4H PRN **OR** acetaminophen (TYLENOL) suppository 650 mg, 650 mg, Rectal, Q4H PRN, Reubin Milan, MD .  allopurinol (ZYLOPRIM) tablet 100 mg, 100 mg, Oral, BID, Reubin Milan, MD, 100 mg at 09/08/16 1005 .  aspirin EC tablet 81 mg, 81 mg, Oral, QPM, Reubin Milan, MD .  bisacodyl (DULCOLAX) EC tablet 5 mg, 5 mg, Oral, Daily PRN, Reubin Milan, MD .  cloNIDine (CATAPRES) tablet 0.1 mg, 0.1 mg, Oral, QHS, Reubin Milan, MD, 0.1 mg at 09/08/16 0124 .  doxazosin (CARDURA) tablet 4 mg, 4 mg, Oral, Daily, Reubin Milan, MD, 4 mg at 09/08/16 1000 .  [START ON 09/09/2016] enoxaparin (LOVENOX) injection 70 mg, 0.5 mg/kg, Subcutaneous, Q24H, Ihor Austin, RPH .  hydrALAZINE (APRESOLINE) tablet 50 mg, 50 mg, Oral, Q8H, Reubin Milan, MD, 50 mg at 09/08/16 3212 .  hydrochlorothiazide (HYDRODIURIL) tablet 25 mg, 25  mg, Oral, Daily, Reubin Milan, MD, 25 mg at 09/08/16 1003 .  insulin glargine (LANTUS) injection 20 Units, 20 Units, Subcutaneous, BID, Reubin Milan, MD, 20 Units at 09/08/16 1002 .  labetalol (NORMODYNE) tablet 300 mg, 300 mg, Oral, BID, Reubin Milan, MD, 300 mg at 09/08/16 1004 .  lisinopril (PRINIVIL,ZESTRIL) tablet 20 mg, 20 mg, Oral, BID, Reubin Milan, MD, 20 mg at 09/08/16 1003 .  potassium chloride SA (K-DUR,KLOR-CON) CR tablet 40 mEq, 40 mEq, Oral, Daily, Reubin Milan, MD, 40 mEq at 09/08/16 0804 .  simvastatin (ZOCOR) tablet 40 mg, 40 mg, Oral, QHS, Reubin Milan, MD, 40 mg at 09/08/16 0133  Patients Current Diet: Diet heart healthy/carb modified Room service appropriate? Yes; Fluid consistency: Thin  Precautions / Restrictions Precautions Precautions: Fall Precaution Comments: R hemiparesis (R inferiorly subluxed shoulder) Restrictions Weight Bearing Restrictions: No   Has the patient had 2 or more falls or a fall with injury in the past year? Yes two fall out of his wheelchair   Prior Activity Level Limited Community (1-2x/wk): Prior to admission patient mostly wheelchair level at home and would go out with sister.  Sister, Collie Siad also assisted with some bathing, dressing, and all medication and money management tasks.  She works as a Financial planner.    Home Assistive Devices / Equipment Home Assistive Devices/Equipment: Wheelchair Home Equipment: Grab bars - toilet, Wheelchair - manual, Tub bench (hemi -walker)  Prior Device Use: Indicate devices/aids used by the patient prior to current illness, exacerbation or injury? Manual wheelchair and Hemi-Walker  Prior Functional Level Prior Function Level of Independence: Needs assistance Gait / Transfers Assistance Needed: uses hemi-walker to walk 5-10 steps to get into bedroom and bathroom, in w/c primarily ADL's / Homemaking Assistance Needed: sister does cooking, assist with dressing and transfers out  of shower. pt can microwave Communication / Swallowing Assistance Needed: expressive difficulties at baeline  Self Care: Did the patient need help bathing, dressing, using the toilet or eating?  Needed some help  Indoor Mobility: Did the patient need assistance with walking from room to room (with or without device)? Independent  Stairs: Did the patient need assistance with internal or external stairs (with or without device)? Needed some help  Functional Cognition: Did the patient need help planning regular tasks such as shopping or remembering to take medications? Dependent  Current Functional Level Cognition  Overall Cognitive Status: Within Functional Limits for tasks assessed Orientation Level: Oriented X4    Extremity Assessment (includes Sensation/Coordination)  Upper Extremity Assessment: RUE deficits/detail RUE Deficits / Details: flaccid, can not  use functionally, subluxed shoulder. States he has sling at home RUE Coordination: decreased fine motor, decreased gross motor  Lower Extremity Assessment: Defer to PT evaluation RLE Deficits / Details: + clonus with movement; R knee hyperextension with mobility    ADLs  Overall ADL's : Needs assistance/impaired Grooming: Set up Upper Body Bathing: Set up, Sitting Lower Body Bathing: Minimal assistance Lower Body Bathing Details (indicate cue type and reason): for pericare Lower Body Dressing: Minimal assistance Toilet Transfer: Minimal assistance, Stand-pivot (with hemi walker) Toileting- Clothing Manipulation and Hygiene: Moderate assistance Toileting - Clothing Manipulation Details (indicate cue type and reason): Pt incontinenet during session Functional mobility during ADLs: Minimal assistance (hemi walker; limited as pt does not have his shoes) General ADL Comments: Pt does not have his shoes or his glasses and states he does much better with shoes on and when he can see    Mobility  Overal bed mobility: Needs  Assistance Bed Mobility: Supine to Sit Supine to sit: Min assist General bed mobility comments: OOB in chair    Transfers  Overall transfer level: Needs assistance Equipment used:  (hemi-walker) Transfers: Sit to/from Stand Sit to Stand: Supervision Stand pivot transfers: Min assist General transfer comment: limited due to lack of shoes    Ambulation / Gait / Stairs / Wheelchair Mobility  Ambulation/Gait General Gait Details: unable at this time    Posture / Balance Balance Overall balance assessment: Needs assistance Sitting-balance support: Feet supported, Single extremity supported Sitting balance-Leahy Scale: Good Standing balance support: Single extremity supported, During functional activity Standing balance-Leahy Scale: Fair Standing balance comment: pt stood at sink for pericare    Special needs/care consideration BiPAP/CPAP: Has worn in the past but not recently  CPM: No Continuous Drip IV: No Dialysis: No        Life Vest: No Oxygen: No Special Bed: No has a hospital bed at home Trach Size: No Wound Vac (area): No       Skin: WDL per chart review                               Bowel mgmt: 4 continent bowel movement today  Bladder mgmt: continent with use of urinal  Diabetic mgmt: Yes: diet, oral meds and insulin PTA     Previous Home Environment Living Arrangements: Other relatives (sister) Available Help at Discharge: Family, Available PRN/intermittently (sister works 2, 24 hour shifts a week.) Type of Home: House Home Layout: One level Home Access:  (a threshold step) Bathroom Shower/Tub: Chiropodist: Standard Bathroom Accessibility: Yes How Accessible: Accessible via walker  Discharge Living Setting Plans for Discharge Living Setting: Patient's home, Lives with (comment) (sister Collie Siad) Type of Home at Discharge: House Discharge Home Layout: One level Discharge Home Access: Other (comment) (threshold) Discharge Bathroom Shower/Tub:  Tub/shower unit, Curtain, Other (comment) (have a tub bench ) Discharge Bathroom Toilet: Standard (elevated with equippment ) Discharge Bathroom Accessibility: Yes How Accessible: Accessible via walker (used hemi walker PTA) Does the patient have any problems obtaining your medications?: No  Social/Family/Support Systems Patient Roles: Other (Comment) (sibling) Contact Information: Joylene Grapes [phone] Anticipated Caregiver: Sister, provided intermittent assist PTA can take time off or schedule assist if 24/7 supervision is needed at discharge  Anticipated Caregiver's Contact Information: see above Ability/Limitations of Caregiver: she works 24 on 72 off as a Architect Availability: Intermittent Is Caregiver In Agreement with Plan?: Yes Does Caregiver/Family have Issues with Lodging/Transportation  while Pt is in Rehab?: No  Goals/Additional Needs Patient/Family Goal for Rehab: PT Mod I-Supervision; OT Supervision-Min A Expected length of stay: 8-12 days  Cultural Considerations: None Dietary Needs: Carb Mod  Equipment Needs: TBD Special Service Needs: None Additional Information: had deficits prior to admission but was Mod I transfers and to walk into bed and bath with hemiwalker short distances Pt/Family Agrees to Admission and willing to participate: Yes Program Orientation Provided & Reviewed with Pt/Caregiver Including Roles  & Responsibilities: Yes Additional Information Needs: None Information Needs to be Provided By: N/A  Decrease burden of Care through IP rehab admission: No   Possible need for SNF placement upon discharge: Not anticipated   Patient Condition: This patient's condition remains as documented in the consult dated 09/08/16, in which the Rehabilitation Physician determined and documented that the patient's condition is appropriate for intensive rehabilitative care in an inpatient rehabilitation facility. Will admit to inpatient rehab  today.  Preadmission Screen Completed By:  Gunnar Fusi, 09/08/2016 3:54 PM ______________________________________________________________________   Discussed status with Dr. Naaman Plummer on 09/08/16 at 1550 and received telephone approval for admission today.  Admission Coordinator:  Gunnar Fusi, time 1550/Date 09/08/16

## 2016-09-08 NOTE — Care Management Obs Status (Signed)
[facility] NOTIFICATION   Patient Details  Name: MALLORY ENRIQUES MRN: [id number] Date of Birth: Oct 31, 1955   Medicare Observation Status Notification Given:  Yes    Pollie Friar, RN 09/08/2016, 2:33 PM

## 2016-09-08 NOTE — Progress Notes (Signed)
Inpatient Rehabilitation  Met with patient to discuss team's recommendation for IP Rehab.  Shared booklets and answered questions.  Patient and sister are eager for IP Rehab.  Plan to admit patient today.  Please call with questions.   Carmelia Roller., CCC/SLP Admission Coordinator  [facility]  Cell [phone]

## 2016-09-08 NOTE — Progress Notes (Signed)
Arrived from [facility] at [facility]. Alert and oriented. Sister at bedside. Oriented to room and call light within reach.

## 2016-09-08 NOTE — Consult Note (Signed)
Neurology Consultation Reason for Consult: Right leg weakness Referring Physician: Lita Mains  CC: Right leg weakness  History is obtained from: Patient  HPI: Eduardo Young is a 61 y.o. male the history of previous stroke resulting in right hemiparesis who presents with worsening right leg weakness.  He states that at least once a week, he has involuntary shaking of his right lower extremity. This lasts for anywhere from a few seconds to 2 minutes and following these episodes his leg is weak for 20 or 30 minutes slowly resolving. Occasionally, his arm will be involved as well, but never his face.  The episode that he had tonight was typical of one of these episodes lasted for significantly longer than typical and he was weak for longer afterwards. He had been on the toilet, and then laid down but was unable to get up. Due to being unable to get up, EMS was called and he was brought into the emergency department.  He currently feels that he is back to baseline.    ROS: A 14 point ROS was performed and is negative except as noted in the HPI.   Past Medical History:  Diagnosis Date  . Chronic venous insufficiency   . Diabetes mellitus   . GERD (gastroesophageal reflux disease)   . Gout   . Hemiparesis ([place])   . Hyperlipidemia   . Hypertension   . Obesity   . Renal calculi   . Sleep apnea, obstructive   . Stroke [facility])    [id number]     Family History  Problem Relation Age of Onset  . Heart attack Mother   . Hypertension Mother   . Hyperlipidemia Mother   . Hypertension Father   . Hyperlipidemia Father   . CVA Father   . Heart attack Sister   . Lung cancer Brother   . Heart attack Brother   . Ovarian cancer Sister   . Heart attack Sister   . CVA Sister      Social History:  reports that he has quit smoking. He has never used smokeless tobacco. He reports that he drinks alcohol. He reports that he uses drugs, including Cocaine and Marijuana.   Exam: Current vital  signs: BP (!) 201/97 (BP Location: Right Arm)   Pulse 72   Temp 97.4 F (36.3 C) (Oral)   Resp 18   Ht 6' 3"  (1.905 m)   Wt 136 kg (299 lb 13.2 oz)   SpO2 97%   BMI 37.48 kg/m  Vital signs in last 24 hours: Temp:  [97.4 F (36.3 C)-98 F (36.7 C)] 97.4 F (36.3 C) (02/16 0041) Pulse Rate:  [56-72] 72 (02/16 0041) Resp:  [17-25] 18 (02/16 0041) BP: (141-201)/(79-105) 201/97 (02/16 0041) SpO2:  [91 %-97 %] 97 % (02/16 0041) Weight:  [136 kg (299 lb 13.2 oz)-136.1 kg (300 lb)] 136 kg (299 lb 13.2 oz) (02/16 0041)   Physical Exam  Constitutional: Appears well-developed and well-nourished.  Psych: Affect appropriate to situation Eyes: No scleral injection HENT: No OP obstrucion Head: Normocephalic.  Cardiovascular: Normal rate and regular rhythm.  Respiratory: Effort normal and breath sounds normal to anterior ascultation GI: Soft.  No distension. There is no tenderness.  Skin: WDI  Neuro: Mental Status: Patient is awake, alert, oriented to person, place, month, year, and situation. Patient is able to give a clear and coherent history. No signs of aphasia or neglect Cranial Nerves: II: Visual Fields are full. Pupils are equal, round, and reactive to light.  III,IV, VI: EOMI without ptosis or diploplia.  V: Facial sensation is decreased on the right VII: Facial movement is notable for right facial weakness VIII: hearing is intact to voice X: Uvula elevates symmetrically XI: Shoulder shrug is symmetric. XII: tongue is midline without atrophy or fasciculations.  Motor: Tone is normal. Bulk is normal. He has 5/5 strength on the left, 4/5 strength in the right leg, 0/5 in the right arm Sensory: Sensation is decreased throughout the right side Cerebellar: Intact FNF on  left   I have reviewed labs in epic and the results pertinent to this consultation are: Elevated creatinine, low potassium  I have reviewed the images obtained: CT head-no acute findings  Impression:  61 year old male with a history of right hemiparesis with intermittent episodes of uncontrolled movement of the right leg followed by weakness. This is concerning for partial seizures. Though clonus could be considered, I think this is less likely given  That it does not always position dependent and it has weakness following it.  Recommendations: 1) MRI brain 2) no need for TIA/seizure workup in last MRI is positive 3) EEG 4) if cortical lesion which could be a seizure focus is identified on MRI, would start Keppra, could consider starting Keppra even in the absence of clear focus.   Roland Rack, MD [facility] [phone]  If 7pm- 7am, please page neurology on call as listed in [place].

## 2016-09-08 NOTE — Progress Notes (Signed)
Still at MRI. Medicated with ativan prior.

## 2016-09-08 NOTE — Consult Note (Signed)
Physical Medicine and Rehabilitation Consult  Reason for Consult: Increase in RLE weakness Referring Physician: Dr. [name]   HPI: Eduardo Young is a 61 y.o. 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 MRI with old infarct in left hemisphere affecting thalamus, basal gangli and deep white matter--unchanged. Patient with reports of intermittent uncontrolled movements of RLE followed by weakness--EEG ordered to rule out seizures and neurology recommended starting Keppra .    Review of Systems  HENT: Negative for hearing loss and tinnitus.   Eyes: Negative for blurred vision and double vision.  Respiratory: Negative for cough and shortness of breath.   Cardiovascular: Negative for chest pain and palpitations.  Gastrointestinal: Positive for constipation. Negative for diarrhea and heartburn.  Genitourinary: Negative for dysuria and urgency.  Musculoskeletal: Negative for back pain and myalgias.  Skin: Negative for itching and rash.  Neurological: Positive for tingling (on the right), sensory change, focal weakness and weakness.  Psychiatric/Behavioral: The patient has insomnia (goes to bed around 3 am and sleeps till  1-2 pm ).       Past Medical History:  Diagnosis Date  . Chronic venous insufficiency   . Diabetes mellitus   . GERD (gastroesophageal reflux disease)   . Gout   . Hemiparesis ([place])   . Hyperlipidemia   . Hypertension   . Obesity   . Renal calculi   . Sleep apnea, obstructive   . Stroke [facility])    [id number]    Past Surgical History:  Procedure Laterality Date  . LITHOTRIPSY    . NASAL POLYP EXCISION      Family History  Problem Relation Age of Onset  . Heart attack Mother   . Hypertension Mother   . Hyperlipidemia Mother   . Hypertension Father   . Hyperlipidemia Father   . CVA Father   . Heart attack Sister   . Lung  cancer Brother   . Heart attack Brother   . Ovarian cancer Sister   . Heart attack Sister   . CVA Sister     Social History:  Lives with sister. Independent PTA--needs assist to get out of tub. He assists with home care/meal prep. Sister works 2-24 hour shifts weekly for EMS in [place].  He denies tobacco use. He has never used smokeless tobacco. He reports that he has mixed drink 2-3 times a year.  He reports that he has not used Cocaine or Marijuana for years.     Allergies: No Known Allergies    Medications Prior to Admission  Medication Sig Dispense Refill  . allopurinol (ZYLOPRIM) 100 MG tablet TAKE ONE TABLET BY MOUTH TWICE DAILY 60 tablet 11  . aspirin 81 MG tablet Take 81 mg by mouth every evening.     . bisacodyl (DULCOLAX) 5 MG EC tablet Take 5 mg by mouth daily as needed for constipation.    . cloNIDine (CATAPRES) 0.1 MG tablet Take 1 tablet (0.1 mg total) by mouth 3 (three) times daily. (Patient taking differently: Take 0.1 mg by mouth at bedtime. ) 90 tablet 3  . doxazosin (CARDURA) 4 MG tablet TAKE ONE TABLET BY MOUTH ONCE DAILY 30 tablet 11  . hydrALAZINE (APRESOLINE) 25 MG tablet Take 2 tablets (50 mg total) by mouth 3 (three) times daily. 180 tablet 11  . hydrochlorothiazide (HYDRODIURIL) 25 MG tablet TAKE ONE TABLET BY MOUTH ONCE DAILY  30 tablet 11  . insulin glargine (LANTUS) 100 UNIT/ML injection Inject 20 Units into the skin 2 (two) times daily.    Marland Kitchen labetalol (NORMODYNE) 300 MG tablet TAKE ONE TABLET BY MOUTH TWICE DAILY 60 tablet 11  . lisinopril (PRINIVIL,ZESTRIL) 20 MG tablet TAKE ONE TABLET BY MOUTH TWICE DAILY 60 tablet 11  . metFORMIN (GLUCOPHAGE) 500 MG tablet TAKE ONE TABLET BY MOUTH TWICE DAILY WITH A MEAL 60 tablet 5  . Multiple Vitamin (MULTIVITAMIN WITH MINERALS) TABS tablet Take 1 tablet by mouth daily.    . simvastatin (ZOCOR) 40 MG tablet TAKE ONE TABLET BY MOUTH AT BEDTIME 30 tablet 3  . Insulin Glargine (BASAGLAR KWIKPEN) 100 UNIT/ML SOPN Inject  0.55 mLs (55 Units total) into the skin daily. (Patient not taking: Reported on 09/07/2016) 6 pen 3    Home: [place] expects to be discharged to:: Private residence Living Arrangements: Other relatives (sister) Available Help at Discharge: Family, Available PRN/intermittently (sister works 2, 24 hour shifts a week.) Type of Home: House Home Access:  (a threshold step) Home Layout: One level Bathroom Shower/Tub: Chiropodist: Standard Bathroom Accessibility: Yes Home Equipment: Grab bars - toilet, Wheelchair - manual, Tub bench (hemi -walker)  Functional History: Prior Function Level of Independence: Needs assistance Gait / Transfers Assistance Needed: uses hemi-walker to walk 5-10 steps to get into bedroom and bathroom, in w/c primarily ADL's / Homemaking Assistance Needed: sister does cooking, assist with dressing and transfers out of shower. pt can microwave Communication / Swallowing Assistance Needed: expressive difficulties at baeline Functional Status:  Mobility: Bed Mobility Overal bed mobility: Needs Assistance Bed Mobility: Supine to Sit Supine to sit: Min assist General bed mobility comments: OOB in chair Transfers Overall transfer level: Needs assistance Equipment used:  (hemi-walker) Transfers: Sit to/from Stand Sit to Stand: Supervision Stand pivot transfers: Min assist General transfer comment: limited due to lack of shoes Ambulation/Gait General Gait Details: unable at this time    ADL: ADL Overall ADL's : Needs assistance/impaired Grooming: Set up Upper Body Bathing: Set up, Sitting Lower Body Bathing: Minimal assistance Lower Body Bathing Details (indicate cue type and reason): for pericare Lower Body Dressing: Minimal assistance Toilet Transfer: Minimal assistance, Stand-pivot (with hemi walker) Toileting- Clothing Manipulation and Hygiene: Moderate assistance Toileting - Clothing Manipulation Details (indicate  cue type and reason): Pt incontinenet during session Functional mobility during ADLs: Minimal assistance (hemi walker; limited as pt does not have his shoes) General ADL Comments: Pt does not have his shoes or his glasses and states he does much better with shoes on and when he can see  Cognition: Cognition Overall Cognitive Status: Within Functional Limits for tasks assessed Orientation Level: Oriented X4 Cognition Arousal/Alertness: Awake/alert Behavior During Therapy: WFL for tasks assessed/performed Overall Cognitive Status: Within Functional Limits for tasks assessed   Blood pressure 140/75, pulse 89, temperature 98.6 F (37 C), temperature source Oral, resp. rate 20, height 6' 3"  (1.905 m), weight 136 kg (299 lb 13.2 oz), SpO2 94 %. Physical Exam  Nursing note and vitals reviewed. Constitutional: He is oriented to person, place, and time. He appears well-developed and well-nourished.  HENT:  Head: Normocephalic and atraumatic.  Eyes: Conjunctivae are normal. Pupils are equal, round, and reactive to light.  Neck: Normal range of motion. Neck supple.  Cardiovascular: Normal rate and regular rhythm.   Respiratory: Effort normal and breath sounds normal. No stridor. He exhibits no tenderness.  GI: Soft. Bowel sounds are normal. He exhibits no distension.  There is no tenderness.  Musculoskeletal: He exhibits edema (RUE and BLE).  Neurological: He is alert and oriented to person, place, and time.  Right facial weakness with slow measured speech. Able to follow commands without difficulty. Moves LUE/LLE without difficulty. LUE and LLE. RUE 0/5 RLE HF 3/5 KE 3-/5 0/5 ADF/PF. Myoclonus in RUE and RLE reproduced during exam. DTR's 3+ RUE and RLE. Plantar flexor contracture right heel.    Skin: Skin is warm and dry.  Stasis changes BLE    Results for orders placed or performed during the hospital encounter of 09/07/16 (from the past 24 hour(s))  Ethanol     Status: None   Collection  Time: 09/07/16  8:36 PM  Result Value Ref Range   Alcohol, Ethyl (B) <5 <5 mg/dL  Protime-INR     Status: None   Collection Time: 09/07/16  8:36 PM  Result Value Ref Range   Prothrombin Time 12.9 11.4 - 15.2 seconds   INR 0.98   APTT     Status: Abnormal   Collection Time: 09/07/16  8:36 PM  Result Value Ref Range   aPTT 39 (H) 24 - 36 seconds  CBC     Status: Abnormal   Collection Time: 09/07/16  8:36 PM  Result Value Ref Range   WBC 11.3 (H) 4.0 - 10.5 K/uL   RBC 4.44 4.22 - 5.81 MIL/uL   Hemoglobin 12.3 (L) 13.0 - 17.0 g/dL   HCT 38.0 (L) 39.0 - 52.0 %   MCV 85.6 78.0 - 100.0 fL   MCH 27.7 26.0 - 34.0 pg   MCHC 32.4 30.0 - 36.0 g/dL   RDW 15.0 11.5 - 15.5 %   Platelets 224 150 - 400 K/uL  Differential     Status: Abnormal   Collection Time: 09/07/16  8:36 PM  Result Value Ref Range   Neutrophils Relative % 77 %   Neutro Abs 8.5 (H) 1.7 - 7.7 K/uL   Lymphocytes Relative 13 %   Lymphs Abs 1.5 0.7 - 4.0 K/uL   Monocytes Relative 4 %   Monocytes Absolute 0.5 0.1 - 1.0 K/uL   Eosinophils Relative 6 %   Eosinophils Absolute 0.7 0.0 - 0.7 K/uL   Basophils Relative 0 %   Basophils Absolute 0.0 0.0 - 0.1 K/uL  Comprehensive metabolic panel     Status: Abnormal   Collection Time: 09/07/16  8:36 PM  Result Value Ref Range   Sodium 139 135 - 145 mmol/L   Potassium 3.1 (L) 3.5 - 5.1 mmol/L   Chloride 101 101 - 111 mmol/L   CO2 26 22 - 32 mmol/L   Glucose, Bld 112 (H) 65 - 99 mg/dL   BUN 24 (H) 6 - 20 mg/dL   Creatinine, Ser 1.80 (H) 0.61 - 1.24 mg/dL   Calcium 9.1 8.9 - 10.3 mg/dL   Total Protein 6.5 6.5 - 8.1 g/dL   Albumin 3.7 3.5 - 5.0 g/dL   AST 24 15 - 41 U/L   ALT 27 17 - 63 U/L   Alkaline Phosphatase 72 38 - 126 U/L   Total Bilirubin 0.8 0.3 - 1.2 mg/dL   GFR calc non Af Amer 40 (L) >60 mL/min   GFR calc Af Amer 46 (L) >60 mL/min   Anion gap 12 5 - 15  I-stat troponin, ED (not at [facility])     Status: None   Collection Time: 09/07/16  8:50 PM  Result Value Ref  Range   Troponin i, poc  0.00 0.00 - 0.08 ng/mL   Comment 3          Urine rapid drug screen (hosp performed)not at [facility]     Status: None   Collection Time: 09/07/16 10:43 PM  Result Value Ref Range   Opiates NONE DETECTED NONE DETECTED   Cocaine NONE DETECTED NONE DETECTED   Benzodiazepines NONE DETECTED NONE DETECTED   Amphetamines NONE DETECTED NONE DETECTED   Tetrahydrocannabinol NONE DETECTED NONE DETECTED   Barbiturates NONE DETECTED NONE DETECTED  Urinalysis, Routine w reflex microscopic     Status: Abnormal   Collection Time: 09/07/16 10:43 PM  Result Value Ref Range   Color, Urine YELLOW YELLOW   APPearance CLEAR CLEAR   Specific Gravity, Urine 1.017 1.005 - 1.030   pH 5.0 5.0 - 8.0   Glucose, UA NEGATIVE NEGATIVE mg/dL   Hgb urine dipstick NEGATIVE NEGATIVE   Bilirubin Urine NEGATIVE NEGATIVE   Ketones, ur NEGATIVE NEGATIVE mg/dL   Protein, ur 30 (A) NEGATIVE mg/dL   Nitrite NEGATIVE NEGATIVE   Leukocytes, UA NEGATIVE NEGATIVE   RBC / HPF 0-5 0 - 5 RBC/hpf   WBC, UA 0-5 0 - 5 WBC/hpf   Bacteria, UA NONE SEEN NONE SEEN   Squamous Epithelial / LPF 0-5 (A) NONE SEEN   Hyaline Casts, UA PRESENT   Glucose, capillary     Status: Abnormal   Collection Time: 09/08/16  1:17 AM  Result Value Ref Range   Glucose-Capillary 114 (H) 65 - 99 mg/dL  Lipid panel     Status: Abnormal   Collection Time: 09/08/16  2:51 AM  Result Value Ref Range   Cholesterol 124 0 - 200 mg/dL   Triglycerides 263 (H) <150 mg/dL   HDL 20 (L) >40 mg/dL   Total CHOL/HDL Ratio 6.2 RATIO   VLDL 53 (H) 0 - 40 mg/dL   LDL Cholesterol 51 0 - 99 mg/dL  CBC     Status: Abnormal   Collection Time: 09/08/16  2:51 AM  Result Value Ref Range   WBC 9.9 4.0 - 10.5 K/uL   RBC 4.19 (L) 4.22 - 5.81 MIL/uL   Hemoglobin 11.5 (L) 13.0 - 17.0 g/dL   HCT 35.7 (L) 39.0 - 52.0 %   MCV 85.2 78.0 - 100.0 fL   MCH 27.4 26.0 - 34.0 pg   MCHC 32.2 30.0 - 36.0 g/dL   RDW 14.9 11.5 - 15.5 %   Platelets 205 150 - 400 K/uL    Creatinine, serum     Status: Abnormal   Collection Time: 09/08/16  2:51 AM  Result Value Ref Range   Creatinine, Ser 1.76 (H) 0.61 - 1.24 mg/dL   GFR calc non Af Amer 41 (L) >60 mL/min   GFR calc Af Amer 47 (L) >60 mL/min  Magnesium     Status: None   Collection Time: 09/08/16  2:51 AM  Result Value Ref Range   Magnesium 1.8 1.7 - 2.4 mg/dL  Phosphorus     Status: None   Collection Time: 09/08/16  2:51 AM  Result Value Ref Range   Phosphorus 3.3 2.5 - 4.6 mg/dL   Dg Chest 2 View  Result Date: 09/08/2016 CLINICAL DATA:  Left-sided weakness EXAM: CHEST  2 VIEW COMPARISON:  03/24/2013 FINDINGS: Cardiac shadow is stable. The lungs are well aerated bilaterally. No focal infiltrate or sizable effusion is seen. No bony abnormality is noted. IMPRESSION: No acute abnormality seen. Electronically Signed   By: Inez Catalina M.D.   On:  09/08/2016 07:44   Ct Head Wo Contrast  Result Date: 09/07/2016 CLINICAL DATA:  Right leg weakness.  History of stroke. EXAM: CT HEAD WITHOUT CONTRAST TECHNIQUE: Contiguous axial images were obtained from the base of the skull through the vertex without intravenous contrast. 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 Slight ex vacuo dilatation of body of the left lateral ventricle. Chronic small vessel ischemic disease of periventricular white matter. No acute intracranial hemorrhage, midline shift or edema. No extra-axial fluid. Vascular: No hyperdense vessels or unexpected calcifications. Skull: Intact. Sinuses/Orbits: Clear mastoids. Moderate ethmoid and frontal sinus mucosal thickening. Mild bilateral maxillary and ethmoid sinus mucosal thickening. Other: None IMPRESSION: 1. Chronic small vessel ischemic disease of periventricular white matter. 2. 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 3. No acute intracranial abnormality. 4. Chronic paranasal sinusitis.  Electronically Signed   By: Ashley Royalty M.D.   On: 09/07/2016 21:11   Mr Brain Wo Contrast  Result Date: 09/08/2016 CLINICAL DATA:  Acute onset of weakness last night. EXAM: MRI HEAD WITHOUT CONTRAST TECHNIQUE: Multiplanar, multiecho pulse sequences of the brain and surrounding structures were obtained without intravenous contrast. COMPARISON:  Head CT 09/07/2016 FINDINGS: Brain: Diffusion imaging does not show any acute or subacute infarction. Old [place]erian degeneration of the brainstem on the left. The cerebellum is normal. Cerebral hemispheres show moderate chronic small-vessel ischemic changes of the deep white matter with an old lacunar infarction in the right basal ganglia and an extensive old infarction in the left thalamus, posterior basal ganglia and radiating white matter tracts with atrophy and gliosis. Hemosiderin deposition is present throughout that region. No evidence of neoplastic mass lesion, acute hemorrhage, hydrocephalus or extra-axial collection. There is ex vacuo enlargement of the left lateral ventricle. No pituitary mass. Vascular: Major vessels at the base of the brain show flow. Skull and upper cervical spine: Negative Sinuses and orbits: Mucosal inflammatory changes of the paranasal sinuses. Orbits negative. Other: None significant IMPRESSION: Old infarction in the left hemisphere affecting the thalamus, basal ganglia and deep white matter. Unchanged from previous studies. No acute finding. Electronically Signed   By: Nelson Chimes M.D.   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 According to his wife he's been slowly declining for a year or more. Developed increased involuntary movements in his right leg followed by a period of lethargy which brought up question of seizures. In speaking with his wife the spasms/"jerks" are not too dissimilar from what has happened before. I was able to reproduce the same  myoclonic jerks at bedside (wife witnessed and affirmed).  1. Does the need for close, 24 hr/day medical supervision in concert with the patient's rehab needs make it unreasonable for this patient to be served in a less intensive setting? Yes 2. Co-Morbidities requiring supervision/potential complications: HTN, DM, spasticity/myoclonus mgt, orthotics, skin care, mood 3. Due to bladder management, bowel management, safety, skin/wound care, disease management, medication administration, pain management and patient education, does the patient require 24 hr/day rehab nursing? Yes 4. Does the patient require coordinated care of a physician, rehab nurse, PT (1-2 hrs/day, 5 days/week) and OT (1-2 hrs/day, 5 days/week) to address physical and functional deficits in the context of the above medical diagnosis(es)? Yes Addressing deficits in the following areas: balance, endurance, locomotion, strength, transferring, bowel/bladder control, bathing, dressing, feeding, grooming, toileting, cognition and psychosocial support 5. Can the  patient actively participate in an intensive therapy program of at least 3 hrs of therapy per day at least 5 days per week? Yes 6. The potential for patient to make measurable gains while on inpatient rehab is excellent 7. Anticipated functional outcomes upon discharge from inpatient rehab are modified independent and supervision  with PT, supervision and min assist with OT, n/a with SLP. 8. Estimated rehab length of stay to reach the above functional goals is: 8-12 days 9. Does the patient have adequate social supports and living environment to accommodate these discharge functional goals? Yes 10. Anticipated D/C setting: Home 11. Anticipated post D/C treatments: [place] therapy 12. Overall Rehab/Functional Prognosis: excellent  RECOMMENDATIONS: This patient's condition is appropriate for continued rehabilitative care in the following setting: CIR Patient has agreed to participate in  recommended program. Yes Note that insurance prior authorization may be required for reimbursement for recommended care.  Comment: Pt could benefit from rehab as outlined above. Rehab Admissions Coordinator to follow up.  Thanks,  Meredith Staggers, MD, [place]ilford Pillar, PA-C 09/08/2016

## 2016-09-09 ENCOUNTER — Inpatient Hospital Stay (HOSPITAL_COMMUNITY): Payer: Medicare Other | Admitting: Occupational Therapy

## 2016-09-09 ENCOUNTER — Inpatient Hospital Stay (HOSPITAL_COMMUNITY): Payer: Medicare Other | Admitting: Physical Therapy

## 2016-09-09 DIAGNOSIS — I10 Essential (primary) hypertension: Secondary | ICD-10-CM

## 2016-09-09 LAB — COMPREHENSIVE METABOLIC PANEL
ALT: 24 U/L (ref 17–63)
AST: 19 U/L (ref 15–41)
Albumin: 3.5 g/dL (ref 3.5–5.0)
Alkaline Phosphatase: 71 U/L (ref 38–126)
Anion gap: 12 (ref 5–15)
BUN: 18 mg/dL (ref 6–20)
CO2: 25 mmol/L (ref 22–32)
Calcium: 9.1 mg/dL (ref 8.9–10.3)
Chloride: 106 mmol/L (ref 101–111)
Creatinine, Ser: 1.62 mg/dL — ABNORMAL HIGH (ref 0.61–1.24)
GFR calc Af Amer: 52 mL/min — ABNORMAL LOW (ref >60)
GFR calc non Af Amer: 45 mL/min — ABNORMAL LOW (ref >60)
Glucose, Bld: 124 mg/dL — ABNORMAL HIGH (ref 65–99)
Potassium: 3.2 mmol/L — ABNORMAL LOW (ref 3.5–5.1)
Sodium: 143 mmol/L (ref 135–145)
Total Bilirubin: 0.9 mg/dL (ref 0.3–1.2)
Total Protein: 6.6 g/dL (ref 6.5–8.1)

## 2016-09-09 LAB — CBC WITH DIFFERENTIAL/PLATELET
Basophils Absolute: 0 10*3/uL (ref 0.0–0.1)
Basophils Relative: 0 %
Eosinophils Absolute: 0.6 10*3/uL (ref 0.0–0.7)
Eosinophils Relative: 7 %
HCT: 39.4 % (ref 39.0–52.0)
Hemoglobin: 12.9 g/dL — ABNORMAL LOW (ref 13.0–17.0)
Lymphocytes Relative: 24 %
Lymphs Abs: 2.1 10*3/uL (ref 0.7–4.0)
MCH: 28.2 pg (ref 26.0–34.0)
MCHC: 32.7 g/dL (ref 30.0–36.0)
MCV: 86 fL (ref 78.0–100.0)
Monocytes Absolute: 0.5 10*3/uL (ref 0.1–1.0)
Monocytes Relative: 6 %
Neutro Abs: 5.7 10*3/uL (ref 1.7–7.7)
Neutrophils Relative %: 63 %
Platelets: 204 10*3/uL (ref 150–400)
RBC: 4.58 MIL/uL (ref 4.22–5.81)
RDW: 15.2 % (ref 11.5–15.5)
WBC: 9 10*3/uL (ref 4.0–10.5)

## 2016-09-09 LAB — GLUCOSE, CAPILLARY
Glucose-Capillary: 120 mg/dL — ABNORMAL HIGH (ref 65–99)
Glucose-Capillary: 136 mg/dL — ABNORMAL HIGH (ref 65–99)
Glucose-Capillary: 127 mg/dL — ABNORMAL HIGH (ref 65–99)
Glucose-Capillary: 158 mg/dL — ABNORMAL HIGH (ref 65–99)

## 2016-09-09 LAB — HEMOGLOBIN A1C
Hgb A1c MFr Bld: 7 % — ABNORMAL HIGH (ref 4.8–5.6)
Mean Plasma Glucose: 154 mg/dL

## 2016-09-09 MED ORDER — CLONIDINE HCL 0.1 MG PO TABS
0.1000 mg | ORAL_TABLET | Freq: Two times a day (BID) | ORAL | Status: DC
  Administered 2016-09-09 – 2016-09-17 (×16): 0.1 mg via ORAL
  Filled 2016-09-09 (×16): qty 1

## 2016-09-09 MED ORDER — POTASSIUM CHLORIDE CRYS ER 20 MEQ PO TBCR
40.0000 meq | EXTENDED_RELEASE_TABLET | Freq: Two times a day (BID) | ORAL | Status: DC
  Administered 2016-09-09 – 2016-09-17 (×16): 40 meq via ORAL
  Filled 2016-09-09 (×16): qty 2

## 2016-09-09 NOTE — Progress Notes (Signed)
Eduardo Staggers, MD Physician Signed Physical Medicine and Rehabilitation  Consult Note Date of Service: 09/08/2016 11:51 AM  Related encounter: ED to Hosp-Admission (Discharged) from 09/07/2016 in [place] All Collapse All   [] Hide copied text      Physical Medicine and Rehabilitation Consult  Reason for Consult: Increase in RLE weakness Referring Physician: Dr. [name]   HPI: Eduardo Young is a 61 y.o. 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 MRI with old infarct in left hemisphere affecting thalamus, basal gangli and deep white matter--unchanged. Patient with reports of intermittent uncontrolled movements of RLE followed by weakness--EEG ordered to rule out seizures and neurology recommended starting Keppra .    Review of Systems  HENT: Negative for hearing loss and tinnitus.   Eyes: Negative for blurred vision and double vision.  Respiratory: Negative for cough and shortness of breath.   Cardiovascular: Negative for chest pain and palpitations.  Gastrointestinal: Positive for constipation. Negative for diarrhea and heartburn.  Genitourinary: Negative for dysuria and urgency.  Musculoskeletal: Negative for back pain and myalgias.  Skin: Negative for itching and rash.  Neurological: Positive for tingling (on the right), sensory change, focal weakness and weakness.  Psychiatric/Behavioral: The patient has insomnia (goes to bed around 3 am and sleeps till  1-2 pm ).           Past Medical History:  Diagnosis Date  . Chronic venous insufficiency   . Diabetes mellitus   . GERD (gastroesophageal reflux disease)   . Gout   . Hemiparesis ([place])   . Hyperlipidemia   . Hypertension   . Obesity   . Renal calculi   . Sleep apnea, obstructive   . Stroke [facility])    [id number]          Past Surgical History:  Procedure Laterality Date  . LITHOTRIPSY    . NASAL POLYP EXCISION           Family History  Problem Relation Age of Onset  . Heart attack Mother   . Hypertension Mother   . Hyperlipidemia Mother   . Hypertension Father   . Hyperlipidemia Father   . CVA Father   . Heart attack Sister   . Lung cancer Brother   . Heart attack Brother   . Ovarian cancer Sister   . Heart attack Sister   . CVA Sister     Social History:  Lives with sister. Independent PTA--needs assist to get out of tub. He assists with home care/meal prep. Sister works 2-24 hour shifts weekly for EMS in [place].  He denies tobacco use. He has never used smokeless tobacco. He reports that he has mixed drink 2-3 times a year.  He reports that he has not used Cocaine or Marijuana for years.     Allergies: No Known Allergies          Medications Prior to Admission  Medication Sig Dispense Refill  . allopurinol (ZYLOPRIM) 100 MG tablet TAKE ONE TABLET BY MOUTH TWICE DAILY 60 tablet 11  . aspirin 81 MG tablet Take 81 mg by mouth every evening.     . bisacodyl (DULCOLAX) 5 MG EC tablet Take 5 mg by mouth daily as needed for constipation.    . cloNIDine (CATAPRES) 0.1 MG tablet Take 1 tablet (0.1 mg total) by mouth  3 (three) times daily. (Patient taking differently: Take 0.1 mg by mouth at bedtime. ) 90 tablet 3  . doxazosin (CARDURA) 4 MG tablet TAKE ONE TABLET BY MOUTH ONCE DAILY 30 tablet 11  . hydrALAZINE (APRESOLINE) 25 MG tablet Take 2 tablets (50 mg total) by mouth 3 (three) times daily. 180 tablet 11  . hydrochlorothiazide (HYDRODIURIL) 25 MG tablet TAKE ONE TABLET BY MOUTH ONCE DAILY 30 tablet 11  . insulin glargine (LANTUS) 100 UNIT/ML injection Inject 20 Units into the skin 2 (two) times daily.    Marland Kitchen labetalol (NORMODYNE) 300 MG tablet TAKE ONE TABLET BY MOUTH TWICE DAILY 60 tablet 11  . lisinopril (PRINIVIL,ZESTRIL) 20 MG tablet TAKE ONE  TABLET BY MOUTH TWICE DAILY 60 tablet 11  . metFORMIN (GLUCOPHAGE) 500 MG tablet TAKE ONE TABLET BY MOUTH TWICE DAILY WITH A MEAL 60 tablet 5  . Multiple Vitamin (MULTIVITAMIN WITH MINERALS) TABS tablet Take 1 tablet by mouth daily.    . simvastatin (ZOCOR) 40 MG tablet TAKE ONE TABLET BY MOUTH AT BEDTIME 30 tablet 3  . Insulin Glargine (BASAGLAR KWIKPEN) 100 UNIT/ML SOPN Inject 0.55 mLs (55 Units total) into the skin daily. (Patient not taking: Reported on 09/07/2016) 6 pen 3    Home: [place] expects to be discharged to:: Private residence Living Arrangements: Other relatives (sister) Available Help at Discharge: Family, Available PRN/intermittently (sister works 2, 24 hour shifts a week.) Type of Home: House Home Access:  (a threshold step) Home Layout: One level Bathroom Shower/Tub: Chiropodist: Standard Bathroom Accessibility: Yes Home Equipment: Grab bars - toilet, Wheelchair - manual, Tub bench (hemi -walker)  Functional History: Prior Function Level of Independence: Needs assistance Gait / Transfers Assistance Needed: uses hemi-walker to walk 5-10 steps to get into bedroom and bathroom, in w/c primarily ADL's / Homemaking Assistance Needed: sister does cooking, assist with dressing and transfers out of shower. pt can microwave Communication / Swallowing Assistance Needed: expressive difficulties at baeline Functional Status:  Mobility: Bed Mobility Overal bed mobility: Needs Assistance Bed Mobility: Supine to Sit Supine to sit: Min assist General bed mobility comments: OOB in chair Transfers Overall transfer level: Needs assistance Equipment used:  (hemi-walker) Transfers: Sit to/from Stand Sit to Stand: Supervision Stand pivot transfers: Min assist General transfer comment: limited due to lack of shoes Ambulation/Gait General Gait Details: unable at this time  ADL: ADL Overall ADL's : Needs assistance/impaired Grooming:  Set up Upper Body Bathing: Set up, Sitting Lower Body Bathing: Minimal assistance Lower Body Bathing Details (indicate cue type and reason): for pericare Lower Body Dressing: Minimal assistance Toilet Transfer: Minimal assistance, Stand-pivot (with hemi walker) Toileting- Clothing Manipulation and Hygiene: Moderate assistance Toileting - Clothing Manipulation Details (indicate cue type and reason): Pt incontinenet during session Functional mobility during ADLs: Minimal assistance (hemi walker; limited as pt does not have his shoes) General ADL Comments: Pt does not have his shoes or his glasses and states he does much better with shoes on and when he can see  Cognition: Cognition Overall Cognitive Status: Within Functional Limits for tasks assessed Orientation Level: Oriented X4 Cognition Arousal/Alertness: Awake/alert Behavior During Therapy: WFL for tasks assessed/performed Overall Cognitive Status: Within Functional Limits for tasks assessed   Blood pressure 140/75, pulse 89, temperature 98.6 F (37 C), temperature source Oral, resp. rate 20, height 6' 3"  (1.905 m), weight 136 kg (299 lb 13.2 oz), SpO2 94 %. Physical Exam  Nursing note and vitals reviewed. Constitutional: He is oriented to person,  place, and time. He appears well-developed and well-nourished.  HENT:  Head: Normocephalic and atraumatic.  Eyes: Conjunctivae are normal. Pupils are equal, round, and reactive to light.  Neck: Normal range of motion. Neck supple.  Cardiovascular: Normal rate and regular rhythm.   Respiratory: Effort normal and breath sounds normal. No stridor. He exhibits no tenderness.  GI: Soft. Bowel sounds are normal. He exhibits no distension. There is no tenderness.  Musculoskeletal: He exhibits edema (RUE and BLE).  Neurological: He is alert and oriented to person, place, and time.  Right facial weakness with slow measured speech. Able to follow commands without difficulty. Moves LUE/LLE  without difficulty. LUE and LLE. RUE 0/5 RLE HF 3/5 KE 3-/5 0/5 ADF/PF. Myoclonus in RUE and RLE reproduced during exam. DTR's 3+ RUE and RLE. Plantar flexor contracture right heel.    Skin: Skin is warm and dry.  Stasis changes BLE    Lab Results Last 24 Hours  Results for orders placed or performed during the hospital encounter of 09/07/16 (from the past 24 hour(s))  Ethanol     Status: None   Collection Time: 09/07/16  8:36 PM  Result Value Ref Range   Alcohol, Ethyl (B) <5 <5 mg/dL  Protime-INR     Status: None   Collection Time: 09/07/16  8:36 PM  Result Value Ref Range   Prothrombin Time 12.9 11.4 - 15.2 seconds   INR 0.98   APTT     Status: Abnormal   Collection Time: 09/07/16  8:36 PM  Result Value Ref Range   aPTT 39 (H) 24 - 36 seconds  CBC     Status: Abnormal   Collection Time: 09/07/16  8:36 PM  Result Value Ref Range   WBC 11.3 (H) 4.0 - 10.5 K/uL   RBC 4.44 4.22 - 5.81 MIL/uL   Hemoglobin 12.3 (L) 13.0 - 17.0 g/dL   HCT 38.0 (L) 39.0 - 52.0 %   MCV 85.6 78.0 - 100.0 fL   MCH 27.7 26.0 - 34.0 pg   MCHC 32.4 30.0 - 36.0 g/dL   RDW 15.0 11.5 - 15.5 %   Platelets 224 150 - 400 K/uL  Differential     Status: Abnormal   Collection Time: 09/07/16  8:36 PM  Result Value Ref Range   Neutrophils Relative % 77 %   Neutro Abs 8.5 (H) 1.7 - 7.7 K/uL   Lymphocytes Relative 13 %   Lymphs Abs 1.5 0.7 - 4.0 K/uL   Monocytes Relative 4 %   Monocytes Absolute 0.5 0.1 - 1.0 K/uL   Eosinophils Relative 6 %   Eosinophils Absolute 0.7 0.0 - 0.7 K/uL   Basophils Relative 0 %   Basophils Absolute 0.0 0.0 - 0.1 K/uL  Comprehensive metabolic panel     Status: Abnormal   Collection Time: 09/07/16  8:36 PM  Result Value Ref Range   Sodium 139 135 - 145 mmol/L   Potassium 3.1 (L) 3.5 - 5.1 mmol/L   Chloride 101 101 - 111 mmol/L   CO2 26 22 - 32 mmol/L   Glucose, Bld 112 (H) 65 - 99 mg/dL   BUN 24 (H) 6 - 20 mg/dL   Creatinine, Ser 1.80  (H) 0.61 - 1.24 mg/dL   Calcium 9.1 8.9 - 10.3 mg/dL   Total Protein 6.5 6.5 - 8.1 g/dL   Albumin 3.7 3.5 - 5.0 g/dL   AST 24 15 - 41 U/L   ALT 27 17 - 63 U/L   Alkaline Phosphatase  72 38 - 126 U/L   Total Bilirubin 0.8 0.3 - 1.2 mg/dL   GFR calc non Af Amer 40 (L) >60 mL/min   GFR calc Af Amer 46 (L) >60 mL/min   Anion gap 12 5 - 15  I-stat troponin, ED (not at [facility], [facility]     Status: None   Collection Time: 09/07/16  8:50 PM  Result Value Ref Range   Troponin i, poc 0.00 0.00 - 0.08 ng/mL   Comment 3          Urine rapid drug screen (hosp performed)not at [facility]     Status: None   Collection Time: 09/07/16 10:43 PM  Result Value Ref Range   Opiates NONE DETECTED NONE DETECTED   Cocaine NONE DETECTED NONE DETECTED   Benzodiazepines NONE DETECTED NONE DETECTED   Amphetamines NONE DETECTED NONE DETECTED   Tetrahydrocannabinol NONE DETECTED NONE DETECTED   Barbiturates NONE DETECTED NONE DETECTED  Urinalysis, Routine w reflex microscopic     Status: Abnormal   Collection Time: 09/07/16 10:43 PM  Result Value Ref Range   Color, Urine YELLOW YELLOW   APPearance CLEAR CLEAR   Specific Gravity, Urine 1.017 1.005 - 1.030   pH 5.0 5.0 - 8.0   Glucose, UA NEGATIVE NEGATIVE mg/dL   Hgb urine dipstick NEGATIVE NEGATIVE   Bilirubin Urine NEGATIVE NEGATIVE   Ketones, ur NEGATIVE NEGATIVE mg/dL   Protein, ur 30 (A) NEGATIVE mg/dL   Nitrite NEGATIVE NEGATIVE   Leukocytes, UA NEGATIVE NEGATIVE   RBC / HPF 0-5 0 - 5 RBC/hpf   WBC, UA 0-5 0 - 5 WBC/hpf   Bacteria, UA NONE SEEN NONE SEEN   Squamous Epithelial / LPF 0-5 (A) NONE SEEN   Hyaline Casts, UA PRESENT   Glucose, capillary     Status: Abnormal   Collection Time: 09/08/16  1:17 AM  Result Value Ref Range   Glucose-Capillary 114 (H) 65 - 99 mg/dL  Lipid panel     Status: Abnormal   Collection Time: 09/08/16  2:51 AM  Result Value Ref Range   Cholesterol 124 0 - 200 mg/dL   Triglycerides  263 (H) <150 mg/dL   HDL 20 (L) >40 mg/dL   Total CHOL/HDL Ratio 6.2 RATIO   VLDL 53 (H) 0 - 40 mg/dL   LDL Cholesterol 51 0 - 99 mg/dL  CBC     Status: Abnormal   Collection Time: 09/08/16  2:51 AM  Result Value Ref Range   WBC 9.9 4.0 - 10.5 K/uL   RBC 4.19 (L) 4.22 - 5.81 MIL/uL   Hemoglobin 11.5 (L) 13.0 - 17.0 g/dL   HCT 35.7 (L) 39.0 - 52.0 %   MCV 85.2 78.0 - 100.0 fL   MCH 27.4 26.0 - 34.0 pg   MCHC 32.2 30.0 - 36.0 g/dL   RDW 14.9 11.5 - 15.5 %   Platelets 205 150 - 400 K/uL  Creatinine, serum     Status: Abnormal   Collection Time: 09/08/16  2:51 AM  Result Value Ref Range   Creatinine, Ser 1.76 (H) 0.61 - 1.24 mg/dL   GFR calc non Af Amer 41 (L) >60 mL/min   GFR calc Af Amer 47 (L) >60 mL/min  Magnesium     Status: None   Collection Time: 09/08/16  2:51 AM  Result Value Ref Range   Magnesium 1.8 1.7 - 2.4 mg/dL  Phosphorus     Status: None   Collection Time: 09/08/16  2:51 AM  Result Value  Ref Range   Phosphorus 3.3 2.5 - 4.6 mg/dL      Imaging Results (Last 48 hours)  Dg Chest 2 View  Result Date: 09/08/2016 CLINICAL DATA:  Left-sided weakness EXAM: CHEST  2 VIEW COMPARISON:  03/24/2013 FINDINGS: Cardiac shadow is stable. The lungs are well aerated bilaterally. No focal infiltrate or sizable effusion is seen. No bony abnormality is noted. IMPRESSION: No acute abnormality seen. Electronically Signed   By: Inez Catalina M.D.   On: 09/08/2016 07:44   Ct Head Wo Contrast  Result Date: 09/07/2016 CLINICAL DATA:  Right leg weakness.  History of stroke. EXAM: CT HEAD WITHOUT CONTRAST TECHNIQUE: Contiguous axial images were obtained from the base of the skull through the vertex without intravenous contrast. 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 Slight ex vacuo dilatation of body of the left lateral ventricle. Chronic small vessel ischemic disease of  periventricular white matter. No acute intracranial hemorrhage, midline shift or edema. No extra-axial fluid. Vascular: No hyperdense vessels or unexpected calcifications. Skull: Intact. Sinuses/Orbits: Clear mastoids. Moderate ethmoid and frontal sinus mucosal thickening. Mild bilateral maxillary and ethmoid sinus mucosal thickening. Other: None IMPRESSION: 1. Chronic small vessel ischemic disease of periventricular white matter. 2. 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 3. No acute intracranial abnormality. 4. Chronic paranasal sinusitis. Electronically Signed   By: Ashley Royalty M.D.   On: 09/07/2016 21:11   Mr Brain Wo Contrast  Result Date: 09/08/2016 CLINICAL DATA:  Acute onset of weakness last night. EXAM: MRI HEAD WITHOUT CONTRAST TECHNIQUE: Multiplanar, multiecho pulse sequences of the brain and surrounding structures were obtained without intravenous contrast. COMPARISON:  Head CT 09/07/2016 FINDINGS: Brain: Diffusion imaging does not show any acute or subacute infarction. Old Wallerian degeneration of the brainstem on the left. The cerebellum is normal. Cerebral hemispheres show moderate chronic small-vessel ischemic changes of the deep white matter with an old lacunar infarction in the right basal ganglia and an extensive old infarction in the left thalamus, posterior basal ganglia and radiating white matter tracts with atrophy and gliosis. Hemosiderin deposition is present throughout that region. No evidence of neoplastic mass lesion, acute hemorrhage, hydrocephalus or extra-axial collection. There is ex vacuo enlargement of the left lateral ventricle. No pituitary mass. Vascular: Major vessels at the base of the brain show flow. Skull and upper cervical spine: Negative Sinuses and orbits: Mucosal inflammatory changes of the paranasal sinuses. Orbits negative. Other: None significant IMPRESSION: Old infarction in the left hemisphere affecting the thalamus, basal ganglia and deep  white matter. Unchanged from previous studies. No acute finding. Electronically Signed   By: Nelson Chimes M.D.   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 According to his wife he's been slowly declining for a year or more. Developed increased involuntary movements in his right leg followed by a period of lethargy which brought up question of seizures. In speaking with his wife the spasms/"jerks" are not too dissimilar from what has happened before. I was able to reproduce the same myoclonic jerks at bedside (wife witnessed and affirmed).  1. Does the need for close, 24 hr/day medical supervision in concert with the patient's rehab needs make it unreasonable for this patient to be served in a less intensive setting? Yes 2. Co-Morbidities requiring supervision/potential complications: HTN, DM, spasticity/myoclonus mgt, orthotics, skin care, mood 3. Due to bladder management, bowel management,  safety, skin/wound care, disease management, medication administration, pain management and patient education, does the patient require 24 hr/day rehab nursing? Yes 4. Does the patient require coordinated care of a physician, rehab nurse, PT (1-2 hrs/day, 5 days/week) and OT (1-2 hrs/day, 5 days/week) to address physical and functional deficits in the context of the above medical diagnosis(es)? Yes Addressing deficits in the following areas: balance, endurance, locomotion, strength, transferring, bowel/bladder control, bathing, dressing, feeding, grooming, toileting, cognition and psychosocial support 5. Can the patient actively participate in an intensive therapy program of at least 3 hrs of therapy per day at least 5 days per week? Yes 6. The potential for patient to make measurable gains while on inpatient rehab is excellent 7. Anticipated functional outcomes upon discharge from inpatient rehab are modified independent and supervision   with PT, supervision and min assist with OT, n/a with SLP. 8. Estimated rehab length of stay to reach the above functional goals is: 8-12 days 9. Does the patient have adequate social supports and living environment to accommodate these discharge functional goals? Yes 10. Anticipated D/C setting: Home 11. Anticipated post D/C treatments: [place] therapy 12. Overall Rehab/Functional Prognosis: excellent  RECOMMENDATIONS: This patient's condition is appropriate for continued rehabilitative care in the following setting: CIR Patient has agreed to participate in recommended program. Yes Note that insurance prior authorization may be required for reimbursement for recommended care.  Comment: Pt could benefit from rehab as outlined above. Rehab Admissions Coordinator to follow up.  Thanks,  Eduardo Staggers, MD, [place] Pillar, PA-C 09/08/2016

## 2016-09-09 NOTE — Evaluation (Signed)
Occupational Therapy Assessment and Plan  Patient Details  Name: Eduardo Young MRN: [id number] Date of Birth: 01-23-1956  OT Diagnosis: abnormal posture, cognitive deficits, hemiplegia affecting dominant side and muscle weakness (generalized) Rehab Potential: Rehab Potential (ACUTE ONLY): Good ELOS: 7-9 days   Today's Date: 09/09/2016 OT Individual Time: 5537-4827 OT Individual Time Calculation (min): 65 min     Problem List:  Patient Active Problem List   Diagnosis Date Noted  . Spastic hemiparesis of right dominant side ([place]) 09/08/2016  . Weakness   . Myoclonus   . Seizures ([place])   . TIA (transient ischemic attack) 09/07/2016  . Chronic venous insufficiency   . History of cerebral infarction ([place]) 10/21/2010  . DM (diabetes mellitus) ([place]) 10/21/2010  . HLD (hyperlipidemia) 10/21/2010  . HTN (hypertension) 10/21/2010  . Sleep apnea 10/21/2010    Past Medical History:  Past Medical History:  Diagnosis Date  . Chronic venous insufficiency   . Diabetes mellitus   . GERD (gastroesophageal reflux disease)   . Gout   . Hemiparesis ([place])   . Hyperlipidemia   . Hypertension   . Obesity   . Renal calculi   . Sleep apnea, obstructive   . Stroke [facility])    [id number]   Past Surgical History:  Past Surgical History:  Procedure Laterality Date  . LITHOTRIPSY    . NASAL POLYP EXCISION      Assessment & Plan Clinical Impression: Patient is a 61 y.o. year old male with recent admission to the hospital on 09/07/16 with increase in RLE weakness with difficulty walking. MRI with old infarct in left hemisphere affecting thalamus, basal gangli and deep white matter--unchanged.   Patient transferred to CIR on 09/08/2016 .    Patient currently requires min with basic self-care skills secondary to muscle weakness, impaired timing and sequencing, abnormal tone, unbalanced muscle activation and decreased coordination, decreased problem solving and decreased standing balance, decreased  postural control, hemiplegia and decreased balance strategies.  Prior to hospitalization, patient could complete ADLs with supervision.  Patient will benefit from skilled intervention to decrease level of assist with basic self-care skills and increase independence with basic self-care skills prior to discharge home with care partner.  Anticipate patient will require 24 hour supervision and follow up home health.  OT - End of Session Activity Tolerance: Tolerates 30+ min activity with multiple rests Endurance Deficit: Yes OT Assessment Rehab Potential (ACUTE ONLY): Good Barriers to Discharge: Decreased caregiver support Barriers to Discharge Comments: Pt's sister works 2 days a week. OT Patient demonstrates impairments in the following area(s): Balance;Cognition;Motor;Endurance;Safety OT Basic ADL's Functional Problem(s): Grooming;Bathing;Dressing;Toileting OT Advanced ADL's Functional Problem(s): Simple Meal Preparation OT Transfers Functional Problem(s): Tub/Shower;Toilet OT Additional Impairment(s): Fuctional Use of Upper Extremity OT Plan OT Intensity: Minimum of 1-2 x/day, 45 to 90 minutes OT Frequency: 5 out of 7 days OT Duration/Estimated Length of Stay: 7-9 days OT Treatment/Interventions: Balance/vestibular training;Cognitive remediation/compensation;Community reintegration;Discharge planning;Functional electrical stimulation;Functional mobility training;Neuromuscular re-education;Psychosocial support;Patient/family education;Self Care/advanced ADL retraining;Therapeutic Activities;UE/LE Coordination activities;Therapeutic Exercise;UE/LE Strength taining/ROM OT Self Feeding Anticipated Outcome(s): modified independent OT Basic Self-Care Anticipated Outcome(s): supervision OT Toileting Anticipated Outcome(s): supervision OT Bathroom Transfers Anticipated Outcome(s): supervision OT Recommendation Patient destination: Home Follow Up Recommendations: 24 hour  supervision/assistance Equipment Recommended: To be determined   Skilled Therapeutic Intervention Began working on selfcare retraining during session, with pt transferring to the shower bench.  Min assist with increased time to complete transfer.  Pt with small steps noted with the LLE and decreased right knee, hip,  and ankle control during stand pivot transfer to the right.  Pt with increased fear of falling as well.  Min assist for washing the right upper arm with max assist needed for washing peri area in standing as pt stated he could not do this.  At home he said he just let water run on it.  No clothing present for dressing but therapist provided total assist for donning gripper socks at end of session secondary to decreased time.  Noted pt cognitively having difficulty answering questions about PLOF when asked if he noticed anything different this admission compared to his baseline a few weeks ago.  Pt left in wheelchair at end of session with call button in reach.  He states that his sister is going to bring clothes for him on Sunday.  Anticipate the need for initial 24 hour supervision at discharge but his sister works two 24 hour shifts and pt will be alone during this time.    OT Evaluation Precautions/Restrictions  Precautions Precautions: Fall Precaution Comments: R hemiparesis Restrictions Weight Bearing Restrictions: No  Pain  No report of pain during session.  Home Living/Prior Functioning Home Living Available Help at Discharge: Family, Available PRN/intermittently Type of Home: House Home Access:  (a threshold step) Home Layout: One level Bathroom Shower/Tub: Chiropodist: Standard Bathroom Accessibility: Yes Prior Function Level of Independence: Independent with homemaking with wheelchair, Independent with transfers, Requires assistive device for independence  Able to Take Stairs?: No Driving: No Vocation: On disability ADL  See Function Section  of chart for details  Vision/Perception  Vision- History Baseline Vision/History: Wears glasses Wears Glasses: At all times Patient Visual Report: No change from baseline Vision- Assessment Vision Assessment?: No apparent visual deficits  Cognition Overall Cognitive Status: History of cognitive impairments - at baseline Arousal/Alertness: Awake/alert Orientation Level: Person;Place;Situation Person: Oriented Place: Oriented Situation: Oriented Year: 2018 Month: February Day of Week: Correct Memory: Impaired Memory Impairment: Decreased recall of new information Immediate Memory Recall: Sock;Blue;Bed Memory Recall: Sock;Blue;Bed Memory Recall Sock: With Cue Memory Recall Blue: With Cue Memory Recall Bed: With Cue Attention: Sustained Sustained Attention: Appears intact Awareness: Appears intact Problem Solving: Impaired Safety/Judgment: Impaired Comments: Pt with difficulty stating deficits from this occurrence and hospital admission compared to previous.   Sensation Sensation Light Touch: Impaired Detail Light Touch Impaired Details: Impaired RUE;Impaired LUE Stereognosis: Not tested Hot/Cold: Not tested Proprioception: Not tested Coordination Gross Motor Movements are Fluid and Coordinated: No Fine Motor Movements are Fluid and Coordinated: No Coordination and Movement Description: Pt currently 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 Motor  Motor Motor: Hemiplegia Motor - Skilled Clinical Observations: Baseline Hemiplegia on the R.  Mobility  Transfers Transfers: Sit to Stand;Stand to Sit Sit to Stand: 4: Min assist;With armrests;With upper extremity assist Stand to Sit: 4: Min assist;With upper extremity assist;With armrests  Trunk/Postural Assessment  Cervical Assessment Cervical Assessment: Within Functional Limits Thoracic Assessment Thoracic Assessment: Exceptions to [facility] (thoracic rounding) Lumbar Assessment Lumbar Assessment:  Exceptions to [facility] (Posterior pelvic tilt in sitting)  Balance Balance Balance Assessed: Yes Static Sitting Balance Static Sitting - Balance Support: Feet supported Static Sitting - Level of Assistance: 6: Modified independent (Device/Increase time) Dynamic Sitting Balance Dynamic Sitting - Balance Support: Feet supported Dynamic Sitting - Level of Assistance: 5: Stand by assistance Static Standing Balance Static Standing - Balance Support: During functional activity Static Standing - Level of Assistance: 4: Min assist Dynamic Standing Balance Dynamic Standing - Balance Support:  During functional activity;Left upper extremity supported Dynamic Standing - Level of Assistance: 4: Min assist Extremity/Trunk Assessment RUE Assessment RUE Assessment: Exceptions to [facility] (Pt with inferior anterior subluxation present.  No active movement noted in the arm or hand at this time since previous CVA.  PROM WFLS for elbow, wrist, and digits) LUE Assessment LUE Assessment: Within Functional Limits   See Function Navigator for Current Functional Status.   Refer to Care Plan for Long Term Goals  Recommendations for other services: None    Discharge Criteria: Patient will be discharged from OT if patient refuses treatment 3 consecutive times without medical reason, if treatment goals not met, if there is a change in medical status, if patient makes no progress towards goals or if patient is discharged from hospital.  The above assessment, treatment plan, treatment alternatives and goals were discussed and mutually agreed upon: by patient  Andie Mortimer OTR/L 09/09/2016, 5:21 PM

## 2016-09-09 NOTE — Progress Notes (Signed)
Occupational Therapy Session Note  Patient Details  Name: Eduardo Young MRN: [id number] Date of Birth: May 18, 1956  Today's Date: 09/09/2016 OT Individual Time: 1430-1515 OT Individual Time Calculation (min): 45 min    Short Term Goals: No short term goals set  Skilled Therapeutic Interventions/Progress Updates:    Treatment session with focus on sit <> stand, awareness of deficits, and d/c planning.  Engaged in modified five time sit > stand with pt pushing up from w/c and transitioning to hemi-walker. See below for details.  Engaged in discussion regarding results and fall risk.  Pt demonstrating decreased awareness of deficits from prior CVA as well as decreased ability to identify areas to address during rehab stay.  Pt very tangential during his explanations requiring cues to redirect to current topic.  Five times Sit to Stand Test (FTSS) Method: Use a straight back chair with a solid seat that is 16-18" high. Ask participant to sit on the chair with arms folded across their chest.   Instructions: "Stand up and sit down as quickly as possible 5 times, keeping your arms folded across your chest."   Measurement: Stop timing when the participant stands the 5th time.  TIME: _47_____ (in seconds)  Times > 13.6 seconds is associated with increased disability and morbidity (Guralnik, 2000) Times > 15 seconds is predictive of recurrent falls in healthy individuals aged 38 and older (Buatois, et al., 2008) Normal performance values in community dwelling individuals aged 71 and older (Bohannon, 2006): o 60-69 years: 11.4 seconds o 70-79 years: 12.6 seconds o 80-89 years: 14.8 seconds  MCID: ? 2.3 seconds for Vestibular Disorders (Meretta, 2006)   Therapy Documentation Precautions:  Restrictions Weight Bearing Restrictions: No General:   Vital Signs: Therapy Vitals Temp: 98.6 F (37 C) Temp Source: Oral Pulse Rate: 83 Resp: 16 BP: 124/65 Patient Position (if appropriate):  Sitting Oxygen Therapy SpO2: 98 % O2 Device: Not Delivered Pain:  Pt with no c/o pain  See Function Navigator for Current Functional Status.   Therapy/Group: Individual Therapy  Simonne Come 09/09/2016, 4:44 PM

## 2016-09-09 NOTE — Progress Notes (Signed)
Occupational Therapy Session Note  Patient Details  Name: Eduardo Young MRN: [id number] Date of Birth: 1956/04/16  Today's Date: 09/09/2016 OT Individual Time: 1259-1345 OT Individual Time Calculation (min): 46 min    Skilled Therapeutic Interventions/Progress Updates: Pt was sitting in w/c at time of arrival, agreeable to tx. Pt self propelled to tub room to practice stand pivot/stand step transfers to tub bench and elevated toilet seat. With extra time and safety cuing, pt completed transfers at [facility] A level. Per pt, he can only access his home bathroom by side stepping without device. He positioned w/c sideways at entrance of bathroom and stood up from there. Pt very adamant regarding continuing with that method of transferring at discharge. Education/practice to continue. Pt was then provided with half lap tray for R UE support.  At end of session pt self propelled to room in manner as written above (backwards, as he did at prior level). He was left with all needs within reach at time of departure.      Therapy Documentation Precautions:  Restrictions Weight Bearing Restrictions: No General:   Vital Signs: Therapy Vitals Temp: 98.6 F (37 C) Temp Source: Oral Pulse Rate: 83 Resp: 16 BP: 124/65 Patient Position (if appropriate): Sitting Oxygen Therapy SpO2: 98 % O2 Device: Not Delivered Pain: No c/o pain during session    ADL:      See Function Navigator for Current Functional Status.   Therapy/Group: Individual Therapy  Nigil Braman A Shafiq Larch 09/09/2016, 3:59 PM

## 2016-09-09 NOTE — Evaluation (Signed)
Physical Therapy Assessment and Plan  Patient Details  Name: Eduardo Young MRN: [id number] Date of Birth: 1955/10/06  PT Diagnosis: Abnormality of gait and Hemiplegia non-dominant Rehab Potential: Good ELOS: 7-10 days    Today's Date: 09/09/2016 PT Individual Time: 0801-0905 PT Individual Time Calculation (min): 64 min    Problem List:  Patient Active Problem List   Diagnosis Date Noted  . Spastic hemiparesis of right dominant side ([place]) 09/08/2016  . Weakness   . Myoclonus   . Seizures ([place])   . TIA (transient ischemic attack) 09/07/2016  . Chronic venous insufficiency   . History of cerebral infarction ([place]) 10/21/2010  . DM (diabetes mellitus) ([place]) 10/21/2010  . HLD (hyperlipidemia) 10/21/2010  . HTN (hypertension) 10/21/2010  . Sleep apnea 10/21/2010    Past Medical History:  Past Medical History:  Diagnosis Date  . Chronic venous insufficiency   . Diabetes mellitus   . GERD (gastroesophageal reflux disease)   . Gout   . Hemiparesis ([place])   . Hyperlipidemia   . Hypertension   . Obesity   . Renal calculi   . Sleep apnea, obstructive   . Stroke [facility])    [id number]   Past Surgical History:  Past Surgical History:  Procedure Laterality Date  . LITHOTRIPSY    . NASAL POLYP EXCISION      Assessment & Plan Clinical Impression: Patient is a 61 y.o. 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 MRI with old infarct in left hemisphere affecting thalamus, basal gangli and deep white matter--unchanged. Patient with reports of intermittent uncontrolled movements of RLE followed by weakness..  Patient transferred to CIR on 09/08/2016 .   Patient currently requires min with mobility secondary to muscle weakness, impaired timing and sequencing, abnormal tone and motor apraxia and decreased standing balance, hemiplegia and decreased balance  strategies.  Prior to hospitalization, patient was modified independent with transfers and gait for short distances with mobility and lived with  sister in a House home.  Home access is   (a threshold step).  Patient will benefit from skilled PT intervention to maximize safe functional mobility, minimize fall risk and decrease caregiver burden for planned discharge home with intermittent assist.  Anticipate patient will benefit from follow up [facility] at discharge.  PT - End of Session Activity Tolerance: Tolerates 10 - 20 min activity with multiple rests Endurance Deficit: Yes PT Assessment Rehab Potential (ACUTE/IP ONLY): Good Barriers to Discharge: Decreased caregiver support Barriers to Discharge Comments: sister works 24 hour shifts as EMT  PT Patient demonstrates impairments in the following area(s): Balance;Behavior;Endurance;Motor;Nutrition;Safety;Perception;Sensory;Skin Integrity PT Transfers Functional Problem(s): Bed Mobility;Bed to Chair;Car;Floor;Furniture PT Locomotion Functional Problem(s): Ambulation;Wheelchair Mobility;Stairs PT Plan PT Intensity: Minimum of 1-2 x/day ,45 to 90 minutes PT Frequency: 5 out of 7 days PT Duration Estimated Length of Stay: 7-10 days  PT Treatment/Interventions: Ambulation/gait training;Balance/vestibular training;Cognitive remediation/compensation;Community reintegration;Discharge planning;DME/adaptive equipment instruction;Functional electrical stimulation;Disease management/prevention;Functional mobility training;Neuromuscular re-education;Pain management;Patient/family education;Psychosocial support;Skin care/wound management;Splinting/orthotics;Therapeutic Exercise;Therapeutic Activities;Stair training;UE/LE Strength taining/ROM;UE/LE Coordination activities;Visual/perceptual remediation/compensation;Wheelchair propulsion/positioning PT Transfers Anticipated Outcome(s): Supervision assist with LRAD  PT Locomotion Anticipated Outcome(s): Mod I at [facility]  level.  PT Recommendation Follow Up Recommendations: Home health PT Patient destination: Home Equipment Recommended: To be determined Equipment Details: has HW and WC   Skilled Therapeutic Intervention PT instructed patient in PT Evaluation and initiated treatment intervention; see below for results. PT educated patient in  POC, rehab potential, rehab goals, and discharge recommendations. Patient returned too room and left sitting in [facility] with call bell in reach and all needs met.      PT Evaluation Precautions/Restrictions Precautions Precautions: Fall Precaution Comments: R hemiparesis Restrictions Weight Bearing Restrictions: No General   Vital Signs Pain   0/10  Home Living/Prior Functioning Home Living Available Help at Discharge: Family;Available PRN/intermittently (sister works 2, 24 hour shifts a week.) Type of Home: House Home Access:  (a threshold step) Home Layout: One level Bathroom Shower/Tub: Chiropodist: Standard Bathroom Accessibility: Yes Prior Function Level of Independence: Independent with homemaking with wheelchair;Independent with transfers;Requires assistive device for independence  Able to Take Stairs?: No Driving: No Vocation: On disability Vision/Perception  Praxis Praxis-Other Comments: R LE  Cognition Overall Cognitive Status: History of cognitive impairments - at baseline Orientation Level: Oriented X4 Memory: Impaired Memory Impairment: Decreased recall of new information Awareness: Appears intact Problem Solving: Impaired Safety/Judgment: Impaired Sensation Sensation Light Touch: Impaired Detail Light Touch Impaired Details: Impaired RLE (deep touch intact) Proprioception: Impaired by gross assessment (knee maintained in hyper extension) Coordination Gross Motor Movements are Fluid and Coordinated: No Fine Motor Movements are Fluid and Coordinated: No Motor  Motor Motor: Hemiplegia Motor - Skilled Clinical  Observations: Baseline Hemiplegia on the R.   Mobility Bed Mobility Bed Mobility: Rolling Right;Rolling Left;Supine to Sit;Sit to Supine Rolling Right: 5: Supervision;With rail Rolling Right Details: Verbal cues for technique Rolling Left: 5: Supervision;With rail Rolling Left Details: Verbal cues for safe use of DME/AE;Verbal cues for technique;Verbal cues for precautions/safety Supine to Sit: 5: Supervision;With rails Supine to Sit Details: Verbal cues for technique;Verbal cues for precautions/safety;Verbal cues for safe use of DME/AE Sit to Supine: 5: Supervision;With rail Sit to Supine - Details: Verbal cues for technique;Verbal cues for precautions/safety;Verbal cues for safe use of DME/AE Transfers Transfers: Yes Sit to Stand: 4: Min assist Sit to Stand Details: Verbal cues for technique;Verbal cues for safe use of DME/AE;Verbal cues for precautions/safety Stand Pivot Transfers: 4: Min assist (with HW) Stand Pivot Transfer Details: Verbal cues for technique;Verbal cues for precautions/safety;Verbal cues for safe use of DME/AE Locomotion  Ambulation Ambulation: Yes Ambulation/Gait Assistance: 4: Min assist Ambulation Distance (Feet): 6 Feet Assistive device: Hemi-walker Ambulation/Gait Assistance Details: Verbal cues for gait pattern Gait Gait: Yes Gait Pattern: Step-to pattern;Right genu recurvatum;Right foot flat;Right steppage;Wide base of support Stairs / Additional Locomotion Stairs: No Architect: Yes Wheelchair Assistance: 5: Supervision Wheelchair Propulsion: Left upper extremity;Left lower extremity Wheelchair Parts Management: Supervision/cueing Distance: 188f   Trunk/Postural Assessment  Cervical Assessment Cervical Assessment: Within Functional Limits Thoracic Assessment Thoracic Assessment: Exceptions to [facility]um[facility] Assessment Lumbar Assessment: Exceptions to [facility] HospitalPostural Control Postural Control: Deficits on evaluation  (delayed in standing. )  Balance Balance Balance Assessed: Yes Static Sitting Balance Static Sitting - Balance Support: Feet supported Static Sitting - Level of Assistance: 6: Modified independent (Device/Increase time) Dynamic Sitting Balance Dynamic Sitting - Balance Support: Feet supported Dynamic Sitting - Level of Assistance: 5: Stand by assistance Static Standing Balance Static Standing - Balance Support: During functional activity Static Standing - Level of Assistance: 4: Min assist Dynamic Standing Balance Dynamic Standing - Balance Support: During functional activity;Left upper extremity supported Dynamic Standing - Level of Assistance: 4: Min assist Extremity Assessment      RLE Assessment RLE Assessment: Exceptions to [facility](Grossly 4/5 strength with motor planning/apraxia deficits. ) LLE Assessment LLE Assessment: Within Functional Limits   See Function Navigator  for Current Functional Status.   Refer to Care Plan for Long Term Goals  Recommendations for other services: None   Discharge Criteria: Patient will be discharged from PT if patient refuses treatment 3 consecutive times without medical reason, if treatment goals not met, if there is a change in medical status, if patient makes no progress towards goals or if patient is discharged from hospital.  The above assessment, treatment plan, treatment alternatives and goals were discussed and mutually agreed upon: by patient  Lorie Phenix 09/09/2016, 9:55 AM

## 2016-09-09 NOTE — Progress Notes (Signed)
Gunnar Fusi [facility] Admission Coordinator Signed Physical Medicine and Rehabilitation  PMR Pre-admission Date of Service: 09/08/2016 3:44 PM  Related encounter: ED to Hosp-Admission (Discharged) from 09/07/2016 in [place]       [] Hide copied text PMR Admission Coordinator Pre-Admission Assessment  Patient: Eduardo Young is an 61 y.o., male MRN: [id number] DOB: 04-01-1956 Height: 6' 3"  (190.5 cm) Weight: 136 kg (299 lb 13.2 oz)                                                                                                                                                  Insurance Information HMO:     PPO:      PCP:      IPA:      80/20:      OTHER:  PRIMARY: Medicare A & B       Policy#: [id number]      Subscriber: Self CM Name:       Phone#:      Fax#:  Pre-Cert#: eligible       Employer:  Benefits:  Phone #:      Name:  Eff. 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 According to his sister he has been slowly declining for a year or more. Developed increased involuntary movements in his right leg followed by a period of lethargy which brought up question of seizures. In speaking with his sister the spasms/"jerks" are not too dissimilar from what has happened before. I was able to reproduce the same myoclonic jerks at bedside (sister witnessed and affirmed).    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 MRI with old infarct in left hemisphere affecting thalamus, basal gangli and deep white matter--unchanged. Patient with reports of intermittent uncontrolled movements of RLE followed by weakness. Therapy evaluations done and patient noted to have limitations in mobility  and self care tasks. CIR recommended for follow up therapy and patient admitted 09/08/16.   NIH Total: 8  Past Medical History      Past Medical History:  Diagnosis Date  . Chronic venous insufficiency   . Diabetes mellitus   . GERD (gastroesophageal reflux disease)   . Gout   . Hemiparesis ([place])   . Hyperlipidemia   . Hypertension   . Obesity   . Renal calculi   . Sleep apnea, obstructive   . Stroke [facility])    [id number]    Family History  family history includes CVA in his father and sister; Heart attack in his brother, mother, sister, and sister; Hyperlipidemia in his father and mother; Hypertension in his father and mother; Lung cancer in his brother; Ovarian cancer in his sister.  Prior Rehab/Hospitalizations:  Has the patient had major surgery during 100 days prior to admission? No  Current Medications   Current Facility-Administered Medications:  .  acetaminophen (TYLENOL) tablet 650 mg, 650 mg, Oral, Q4H PRN **OR** acetaminophen (TYLENOL) solution 650 mg, 650 mg, Per Tube, Q4H PRN **OR** acetaminophen (TYLENOL) suppository 650 mg, 650 mg, Rectal, Q4H PRN, Reubin Milan, MD .  allopurinol (ZYLOPRIM) tablet 100 mg, 100 mg, Oral, BID, Reubin Milan, MD, 100 mg at 09/08/16 1005 .  aspirin EC tablet 81 mg, 81 mg, Oral, QPM, Reubin Milan, MD .  bisacodyl (DULCOLAX) EC tablet 5 mg, 5 mg, Oral, Daily PRN, Reubin Milan, MD .  cloNIDine  (CATAPRES) tablet 0.1 mg, 0.1 mg, Oral, QHS, Reubin Milan, MD, 0.1 mg at 09/08/16 0124 .  doxazosin (CARDURA) tablet 4 mg, 4 mg, Oral, Daily, Reubin Milan, MD, 4 mg at 09/08/16 1000 .  [START ON 09/09/2016] enoxaparin (LOVENOX) injection 70 mg, 0.5 mg/kg, Subcutaneous, Q24H, Ihor Austin, RPH .  hydrALAZINE (APRESOLINE) tablet 50 mg, 50 mg, Oral, Q8H, Reubin Milan, MD, 50 mg at 09/08/16 5361 .  hydrochlorothiazide (HYDRODIURIL) tablet 25 mg, 25 mg, Oral, Daily, Reubin Milan, MD, 25 mg at 09/08/16 1003 .  insulin glargine (LANTUS) injection 20 Units, 20 Units, Subcutaneous, BID, Reubin Milan, MD, 20 Units at 09/08/16 1002 .  labetalol (NORMODYNE) tablet 300 mg, 300 mg, Oral, BID, Reubin Milan, MD, 300 mg at 09/08/16 1004 .  lisinopril (PRINIVIL,ZESTRIL) tablet 20 mg, 20 mg, Oral, BID, Reubin Milan, MD, 20 mg at 09/08/16 1003 .  potassium chloride SA (K-DUR,KLOR-CON) CR tablet 40 mEq, 40 mEq, Oral, Daily, Reubin Milan, MD, 40 mEq at 09/08/16 0804 .  simvastatin (ZOCOR) tablet 40 mg, 40 mg, Oral, QHS, Reubin Milan, MD, 40 mg at 09/08/16 0133  Patients Current Diet: Diet heart healthy/carb modified Room service appropriate? Yes; Fluid consistency: Thin  Precautions / Restrictions Precautions Precautions: Fall Precaution Comments: R hemiparesis (R inferiorly subluxed shoulder) Restrictions Weight Bearing Restrictions: No   Has the patient had 2 or more falls or a fall with injury in the past year? Yes two fall out of his wheelchair   Prior Activity Level Limited Community (1-2x/wk): Prior to admission patient mostly wheelchair level at home and would go out with sister.  Sister, Collie Siad also assisted with some bathing, dressing, and all medication and money management tasks.  She works as a Financial planner.    Home Assistive Devices / Equipment Home Assistive Devices/Equipment: Wheelchair Home Equipment: Grab bars - toilet, Wheelchair - manual,  Tub bench (hemi -walker)  Prior Device Use: Indicate devices/aids used by the patient  prior to current illness, exacerbation or injury? Manual wheelchair and Hemi-Walker  Prior Functional Level Prior Function Level of Independence: Needs assistance Gait / Transfers Assistance Needed: uses hemi-walker to walk 5-10 steps to get into bedroom and bathroom, in w/c primarily ADL's / Homemaking Assistance Needed: sister does cooking, assist with dressing and transfers out of shower. pt can microwave Communication / Swallowing Assistance Needed: expressive difficulties at baeline  Self Care: Did the patient need help bathing, dressing, using the toilet or eating?  Needed some help  Indoor Mobility: Did the patient need assistance with walking from room to room (with or without device)? Independent  Stairs: Did the patient need assistance with internal or external stairs (with or without device)? Needed some help  Functional Cognition: Did the patient need help planning regular tasks such as shopping or remembering to take medications? Dependent  Current Functional Level Cognition  Overall Cognitive Status: Within Functional Limits for tasks assessed Orientation Level: Oriented X4    Extremity Assessment (includes Sensation/Coordination)  Upper Extremity Assessment: RUE deficits/detail RUE Deficits / Details: flaccid, can not use functionally, subluxed shoulder. States he has sling at home RUE Coordination: decreased fine motor, decreased gross motor  Lower Extremity Assessment: Defer to PT evaluation RLE Deficits / Details: + clonus with movement; R knee hyperextension with mobility    ADLs  Overall ADL's : Needs assistance/impaired Grooming: Set up Upper Body Bathing: Set up, Sitting Lower Body Bathing: Minimal assistance Lower Body Bathing Details (indicate cue type and reason): for pericare Lower Body Dressing: Minimal assistance Toilet Transfer: Minimal assistance,  Stand-pivot (with hemi walker) Toileting- Clothing Manipulation and Hygiene: Moderate assistance Toileting - Clothing Manipulation Details (indicate cue type and reason): Pt incontinenet during session Functional mobility during ADLs: Minimal assistance (hemi walker; limited as pt does not have his shoes) General ADL Comments: Pt does not have his shoes or his glasses and states he does much better with shoes on and when he can see    Mobility  Overal bed mobility: Needs Assistance Bed Mobility: Supine to Sit Supine to sit: Min assist General bed mobility comments: OOB in chair    Transfers  Overall transfer level: Needs assistance Equipment used:  (hemi-walker) Transfers: Sit to/from Stand Sit to Stand: Supervision Stand pivot transfers: Min assist General transfer comment: limited due to lack of shoes    Ambulation / Gait / Stairs / Wheelchair Mobility  Ambulation/Gait General Gait Details: unable at this time    Posture / Balance Balance Overall balance assessment: Needs assistance Sitting-balance support: Feet supported, Single extremity supported Sitting balance-Leahy Scale: Good Standing balance support: Single extremity supported, During functional activity Standing balance-Leahy Scale: Fair Standing balance comment: pt stood at sink for pericare    Special needs/care consideration BiPAP/CPAP: Has worn in the past but not recently  CPM: No Continuous Drip IV: No Dialysis: No        Life Vest: No Oxygen: No Special Bed: No has a hospital bed at home Trach Size: No Wound Vac (area): No       Skin: WDL per chart review                               Bowel mgmt: 4 continent bowel movement today  Bladder mgmt: continent with use of urinal  Diabetic mgmt: Yes: diet, oral meds and insulin PTA     Previous Home Environment Living Arrangements: Other relatives (sister) Available Help at Discharge: Family,  Available PRN/intermittently (sister works 2, 24 hour  shifts a week.) Type of Home: House Home Layout: One level Home Access:  (a threshold step) Bathroom Shower/Tub: Chiropodist: Standard Bathroom Accessibility: Yes How Accessible: Accessible via walker  Discharge Living Setting Plans for Discharge Living Setting: Patient's home, Lives with (comment) (sister Collie Siad) Type of Home at Discharge: House Discharge Home Layout: One level Discharge Home Access: Other (comment) (threshold) Discharge Bathroom Shower/Tub: Tub/shower unit, Curtain, Other (comment) (have a tub bench ) Discharge Bathroom Toilet: Standard (elevated with equippment ) Discharge Bathroom Accessibility: Yes How Accessible: Accessible via walker (used hemi walker PTA) Does the patient have any problems obtaining your medications?: No  Social/Family/Support Systems Patient Roles: Other (Comment) (sibling) Contact Information: Joylene Grapes [phone] Anticipated Caregiver: Sister, provided intermittent assist PTA can take time off or schedule assist if 24/7 supervision is needed at discharge  Anticipated Caregiver's Contact Information: see above Ability/Limitations of Caregiver: she works 24 on 72 off as a Architect Availability: Intermittent Is Caregiver In Agreement with Plan?: Yes Does Caregiver/Family have Issues with Lodging/Transportation while Pt is in Rehab?: No  Goals/Additional Needs Patient/Family Goal for Rehab: PT Mod I-Supervision; OT Supervision-Min A Expected length of stay: 8-12 days  Cultural Considerations: None Dietary Needs: Carb Mod  Equipment Needs: TBD Special Service Needs: None Additional Information: had deficits prior to admission but was Mod I transfers and to walk into bed and bath with hemiwalker short distances Pt/Family Agrees to Admission and willing to participate: Yes Program Orientation Provided & Reviewed with Pt/Caregiver Including Roles  & Responsibilities: Yes Additional Information Needs:  None Information Needs to be Provided By: N/A  Decrease burden of Care through IP rehab admission: No   Possible need for SNF placement upon discharge: Not anticipated   Patient Condition: This patient's condition remains as documented in the consult dated 09/08/16, in which the Rehabilitation Physician determined and documented that the patient's condition is appropriate for intensive rehabilitative care in an inpatient rehabilitation facility. Will admit to inpatient rehab today.  Preadmission Screen Completed By:  Gunnar Fusi, 09/08/2016 3:54 PM ______________________________________________________________________   Discussed status with Dr. Naaman Plummer on 09/08/16 at 1550 and received telephone approval for admission today.  Admission Coordinator:  Gunnar Fusi, time 1550/Date 09/08/16       Cosigned by: Meredith Staggers, MD at 09/08/2016 4:43 PM  Revision History

## 2016-09-09 NOTE — Plan of Care (Signed)
Problem: RH BOWEL ELIMINATION Goal: RH STG MANAGE BOWEL WITH ASSISTANCE STG Manage Bowel with Assistance. Mod I  Outcome: Progressing Use of 2 person assist w/stedy when pt communicates need, pt will have no incontinence.  Problem: RH BLADDER ELIMINATION Goal: RH STG MANAGE BLADDER WITH ASSISTANCE STG Manage Bladder With Assistance   Mod I  Outcome: Progressing Use of urinal at night, assistance to bathroom during day, use of 2-per assist w/Stedy, pt will anticipate need to void and communicate for required assistance without incontinence.

## 2016-09-09 NOTE — Progress Notes (Signed)
Called Dr Angelique Holm earlier this evening re:Pt's elevated blood pressures, even after taking evening bp meds.    BP (!) 168/90 (BP Location: Left Arm)   Pulse (!) 59   Temp 97.4 F (36.3 C) (Oral)   Resp 18   Ht 6' 3"  (1.905 m)   Wt (!) 138.3 kg (305 lb)   SpO2 95%   BMI 38.12 kg/m   Pt sleeping currently, HOB at 45, pt refused CPAP device brought up from respiratory.  No new orders currently.  Eduardo Young

## 2016-09-09 NOTE — Progress Notes (Signed)
[facility] PHYSICAL MEDICINE & REHABILITATION     PROGRESS NOTE    Subjective/Complaints: Slept fairly well. Nurse contacted me in the evening with concerns over BP  ROS: pt denies nausea, vomiting, diarrhea, cough, shortness of breath or chest pain   Objective: Vital Signs: Blood pressure (!) 182/76, pulse (!) 55, temperature 98.8 F (37.1 C), temperature source Oral, resp. rate 17, height 6' 3"  (1.905 m), weight (!) 138.3 kg (305 lb), SpO2 100 %. Dg Chest 2 View  Result Date: 09/08/2016 CLINICAL DATA:  Left-sided weakness EXAM: CHEST  2 VIEW COMPARISON:  03/24/2013 FINDINGS: Cardiac shadow is stable. The lungs are well aerated bilaterally. No focal infiltrate or sizable effusion is seen. No bony abnormality is noted. IMPRESSION: No acute abnormality seen. Electronically Signed   By: Inez Catalina M.D.   On: 09/08/2016 07:44   Ct Head Wo Contrast  Result Date: 09/07/2016 CLINICAL DATA:  Right leg weakness.  History of stroke. EXAM: CT HEAD WITHOUT CONTRAST TECHNIQUE: Contiguous axial images were obtained from the base of the skull through the vertex without intravenous contrast. 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 Slight ex vacuo dilatation of body of the left lateral ventricle. Chronic small vessel ischemic disease of periventricular white matter. No acute intracranial hemorrhage, midline shift or edema. No extra-axial fluid. Vascular: No hyperdense vessels or unexpected calcifications. Skull: Intact. Sinuses/Orbits: Clear mastoids. Moderate ethmoid and frontal sinus mucosal thickening. Mild bilateral maxillary and ethmoid sinus mucosal thickening. Other: None IMPRESSION: 1. Chronic small vessel ischemic disease of periventricular white matter. 2. 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 3. No acute intracranial abnormality. 4. Chronic paranasal sinusitis. Electronically Signed    By: Ashley Royalty M.D.   On: 09/07/2016 21:11   Mr Brain Wo Contrast  Result Date: 09/08/2016 CLINICAL DATA:  Acute onset of weakness last night. EXAM: MRI HEAD WITHOUT CONTRAST TECHNIQUE: Multiplanar, multiecho pulse sequences of the brain and surrounding structures were obtained without intravenous contrast. COMPARISON:  Head CT 09/07/2016 FINDINGS: Brain: Diffusion imaging does not show any acute or subacute infarction. Old Wallerian degeneration of the brainstem on the left. The cerebellum is normal. Cerebral hemispheres show moderate chronic small-vessel ischemic changes of the deep white matter with an old lacunar infarction in the right basal ganglia and an extensive old infarction in the left thalamus, posterior basal ganglia and radiating white matter tracts with atrophy and gliosis. Hemosiderin deposition is present throughout that region. No evidence of neoplastic mass lesion, acute hemorrhage, hydrocephalus or extra-axial collection. There is ex vacuo enlargement of the left lateral ventricle. No pituitary mass. Vascular: Major vessels at the base of the brain show flow. Skull and upper cervical spine: Negative Sinuses and orbits: Mucosal inflammatory changes of the paranasal sinuses. Orbits negative. Other: None significant IMPRESSION: Old infarction in the left hemisphere affecting the thalamus, basal ganglia and deep white matter. Unchanged from previous studies. No acute finding. Electronically Signed   By: Nelson Chimes M.D.   On: 09/08/2016 06:40    Recent Labs  09/08/16 0251 09/09/16 0701  WBC 9.9 9.0  HGB 11.5* 12.9*  HCT 35.7* 39.4  PLT 205 204    Recent Labs  09/07/16 2036 09/08/16 0251 09/09/16 0701  NA 139  --  143  K 3.1*  --  3.2*  CL 101  --  106  GLUCOSE 112*  --  124*  BUN 24*  --  18  CREATININE 1.80* 1.76*  1.62*  CALCIUM 9.1  --  9.1   CBG (last 3)   Recent Labs  09/08/16 0117 09/08/16 2246 09/09/16 0635  GLUCAP 114* 142* 120*    Wt Readings from  Last 3 Encounters:  09/08/16 (!) 138.3 kg (305 lb)  09/08/16 136 kg (299 lb 13.2 oz)  03/03/14 131.5 kg (290 lb)    Physical Exam:  Nursing note and vitals reviewed.  Constitutional: He is oriented to person, place, and time. He appears well-developed and well-nourished.  HENT:  Head: Normocephalic and atraumatic.  Mouth/Throat: Oropharynx is clear and moist.  Eyes: Conjunctivae are normal. Pupils are equal, round, and reactive to light.  Neck: Normal range of motion. Neck supple.  Cardiovascular: RRR Respiratory: CTA B.  GI: Soft. Bowel sounds are normal. He exhibits no distension.  Musculoskeletal: He exhibits edema.  BLE with stasis changes and healed lesions. Min edema RLE > LLE and right hand.  Neurological: He is alert and oriented to person, place, and time.  Right facial weakness with slow measured speech. Able to follow commands without difficulty. Moves LUE/LLE without difficulty.  LUE and LLE 5/5.  RUE 0/5 RLE HF 3/5 KE 3-/5 0/5 ADF/PF--no change today  Myoclonus in RUE and RLE with PROM.. DTR's 3+ RUE and RLE. Plantar flexor contracture right heel present Skin: Skin is warm and dry.  Psychiatric: He has a normal mood, a little flat   Assessment/Plan: 1. 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 Physiatrist is providing close team supervision and 24 hour management of active medical problems listed below. Physiatrist and rehab team continue to assess barriers to discharge/monitor patient progress toward functional and medical goals.  Function:  Bathing Bathing position      Bathing parts      Bathing assist        Upper Body Dressing/Undressing Upper body dressing                    Upper body assist        Lower Body Dressing/Undressing Lower body dressing                                   Lower body assist        Toileting Toileting   Toileting steps completed by patient: Adjust clothing prior to toileting   Toileting Assistive Devices: Toilet aid (urinal)  Toileting assist Assist level: No help/no cues   Transfers Chair/bed Clinical biochemist          Cognition Comprehension Comprehension assist level: Understands complex 90% of the time/cues 10% of the time  Expression Expression assist level: Expresses basic needs/ideas: With no assist  Social Interaction Social Interaction assist level: Interacts appropriately with others with medication or extra time (anti-anxiety, antidepressant).  Problem Solving Problem solving assist level: Solves basic 90% of the time/requires cueing < 10% of the time  Memory Memory assist level: More than reasonable amount of time   Medical Problem List and Plan:  1. 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 DVT Prophylaxis/Anticoagulation: Pharmaceutical: Lovenox  3. Pain Management: N/A  4. Mood: LCSW to follow for evaluation  and support.  5. Neuropsych: This patient is capable of making decisions on his own behalf.  6. Skin/Wound Care: routine pressure relief measures. TEDs for LE edema control.  7. Fluids/Electrolytes/Nutrition: encourage PO  I personally reviewed the patient's labs today.  -replace postassium  8. HTN: poor control thus far.   -On catapres, labetalol, hydralazine, HCTZ, cardura and lisinopril   -increase catapres to BID 9.T2DM with neuropathy and nephropathy: Monitor BS ac/hs. Continue lantus 20 units bid--metformin held due to CKD.  10. CKD: Monitor renal status serially. Baseline SCr- 1.7---near baseline on today's labs 11. Hypokalemia: Will recheck in am. Encourage PO  12. Chronic constipation: will start senna at nights.  13. Spastic hemiparesis with myoclonus: Added low  dose klonopin for myoclonic jerks  -consider baclofen trial as well   LOS (Days) 1 A FACE TO FACE EVALUATION WAS PERFORMED  Eduardo Simons T, MD 09/09/2016 9:20 AM

## 2016-09-10 LAB — GLUCOSE, CAPILLARY
Glucose-Capillary: 126 mg/dL — ABNORMAL HIGH (ref 65–99)
Glucose-Capillary: 141 mg/dL — ABNORMAL HIGH (ref 65–99)
Glucose-Capillary: 142 mg/dL — ABNORMAL HIGH (ref 65–99)
Glucose-Capillary: 165 mg/dL — ABNORMAL HIGH (ref 65–99)

## 2016-09-10 MED ORDER — SORBITOL 70 % SOLN
60.0000 mL | Status: AC
  Administered 2016-09-10: 60 mL via ORAL
  Filled 2016-09-10: qty 60

## 2016-09-10 MED ORDER — CLONAZEPAM 0.5 MG PO TABS
0.5000 mg | ORAL_TABLET | Freq: Two times a day (BID) | ORAL | Status: DC
  Administered 2016-09-10 – 2016-09-17 (×14): 0.5 mg via ORAL
  Filled 2016-09-10 (×14): qty 1

## 2016-09-10 NOTE — Progress Notes (Signed)
[facility] PHYSICAL MEDICINE & REHABILITATION     PROGRESS NOTE    Subjective/Complaints: Had a good day with therapy. Reports no jerking/spasms throughout the day  ROS: pt denies nausea, vomiting, diarrhea, cough, shortness of breath or chest pain  Objective: Vital Signs: Blood pressure (!) 159/86, pulse (!) 58, temperature 97.7 F (36.5 C), temperature source Oral, resp. rate 17, height 6' 3"  (1.905 m), weight (!) 138.3 kg (305 lb), SpO2 97 %. No results found.  Recent Labs  09/08/16 0251 09/09/16 0701  WBC 9.9 9.0  HGB 11.5* 12.9*  HCT 35.7* 39.4  PLT 205 204    Recent Labs  09/07/16 2036 09/08/16 0251 09/09/16 0701  NA 139  --  143  K 3.1*  --  3.2*  CL 101  --  106  GLUCOSE 112*  --  124*  BUN 24*  --  18  CREATININE 1.80* 1.76* 1.62*  CALCIUM 9.1  --  9.1   CBG (last 3)   Recent Labs  09/09/16 1627 09/09/16 2037 09/10/16 0623  GLUCAP 127* 158* 126*    Wt Readings from Last 3 Encounters:  09/08/16 (!) 138.3 kg (305 lb)  09/08/16 136 kg (299 lb 13.2 oz)  03/03/14 131.5 kg (290 lb)    Physical Exam:  Nursing note and vitals reviewed.  Constitutional: He is oriented to person, place, and time. He appears well-developed and well-nourished.  HENT:  Head: Normocephalic and atraumatic.  Mouth/Throat: Oropharynx is clear and moist.  Eyes: Conjunctivae are normal. Pupils are equal, round, and reactive to light.  Neck: Normal range of motion. Neck supple.  Cardiovascular: RRR Respiratory: CTA B.  GI: Soft. Bowel sounds are normal. He exhibits no distension.  Musculoskeletal: He exhibits edema.  BLE with stasis changes and healed lesions. Min edema RLE > LLE and right hand.  Neurological: He is alert and oriented to person, place, and time.  Right facial weakness with slow measured speech. Able to follow commands without difficulty. Moves LUE/LLE without difficulty.  LUE and LLE 5/5.  RUE 0/5 RLE HF 3/5 KE 3-/5 0/5 ADF/PF--no change today  Myoclonus  in RUE and RLE with PROM.. DTR's 3+ RUE and RLE. Plantar flexor contracture right heel present Skin: Skin is warm and dry.  Psychiatric: He has a normal mood, a little flat   Assessment/Plan: 1. 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 Physiatrist is providing close team supervision and 24 hour management of active medical problems listed below. Physiatrist and rehab team continue to assess barriers to discharge/monitor patient progress toward functional and medical goals.  Function:  Bathing Bathing position   Position: Shower  Bathing parts Body parts bathed by patient: Chest, Abdomen, Front perineal area, Left lower leg, Right lower leg, Left upper leg, Right upper leg Body parts bathed by helper: Buttocks, Back  Bathing assist Assist Level: Touching or steadying assistance(Pt > 75%)      Upper Body Dressing/Undressing Upper body dressing   What is the patient wearing?: Hospital gown                Upper body assist        Lower Body Dressing/Undressing Lower body dressing   What is the patient wearing?: Hospital Gown, Non-skid slipper socks           Non-skid slipper socks- Performed by helper: Don/doff right sock, Don/doff left sock  Lower body assist        Toileting Toileting   Toileting steps completed by patient: Adjust clothing prior to toileting   Toileting Assistive Devices: Toilet aid (urinal)  Toileting assist Assist level: No help/no cues   Transfers Chair/bed transfer   Chair/bed transfer method: Stand pivot Chair/bed transfer assist level: Touching or steadying assistance (Pt > 75%) Chair/bed transfer assistive device: Medical sales representative     Max distance: 97f Assist level: Touching or steadying assistance (Pt > 75%)   Wheelchair    Type: Manual Max wheelchair distance: 1547f Assist Level: Supervision or verbal cues  Cognition Comprehension Comprehension assist level: Understands basic 75 - 89% of the time/ requires cueing 10 - 24% of the time  Expression Expression assist level: Expresses basic 75 - 89% of the time/requires cueing 10 - 24% of the time. Needs helper to occlude trach/needs to repeat words.  Social Interaction Social Interaction assist level: Interacts appropriately 75 - 89% of the time - Needs redirection for appropriate language or to initiate interaction.  Problem Solving Problem solving assist level: Solves basic 75 - 89% of the time/requires cueing 10 - 24% of the time  Memory Memory assist level: Recognizes or recalls 75 - 89% of the time/requires cueing 10 - 24% of the time   Medical Problem List and Plan:  1. 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 DVT Prophylaxis/Anticoagulation: Pharmaceutical: Lovenox  3. Pain Management: N/A  4. Mood: LCSW to follow for evaluation and support.  5. Neuropsych: This patient is capable of making decisions on his own behalf.  6. Skin/Wound Care: routine pressure relief measures. TEDs for LE edema control.  7. Fluids/Electrolytes/Nutrition: encourage PO  -replace postassium 3.2 2/17  -recheck BMET tomorrow 8. HTN: some improvement yesterday.   -On catapres, labetalol, hydralazine, HCTZ, cardura and lisinopril   -increased catapres to BID on 2/17 9.T2DM with neuropathy and nephropathy: Monitor BS ac/hs. Continue lantus 20 units bid--metformin held due to CKD.   -fair control at present 10. CKD: Monitor renal status serially. Baseline SCr- 1.7---near baseline on today's labs 11. Hypokalemia: repleting K+ 12. Chronic constipation:  senna at nights  -no bm since admit?  -sorbitol today.  13. Spastic hemiparesis with myoclonus: Continue low dose klonopin for myoclonus--titrate to 0.81m73mid  -consider  baclofen trial as well   LOS (Days) 2 A FACE TO FACE EVALUATION WAS PERFORMED  SWAMeredith StaggersD 09/10/2016 8:57 AM

## 2016-09-10 NOTE — Plan of Care (Signed)
Problem: RH BOWEL ELIMINATION Goal: RH STG MANAGE BOWEL WITH ASSISTANCE STG Manage Bowel with Assistance. Mod I  Outcome: Not Progressing LBM 2/16; sorbitol on board per order

## 2016-09-11 ENCOUNTER — Inpatient Hospital Stay (HOSPITAL_COMMUNITY): Payer: Medicare Other | Admitting: *Deleted

## 2016-09-11 ENCOUNTER — Inpatient Hospital Stay (HOSPITAL_COMMUNITY): Payer: Medicare Other | Admitting: Occupational Therapy

## 2016-09-11 DIAGNOSIS — I10 Essential (primary) hypertension: Secondary | ICD-10-CM

## 2016-09-11 DIAGNOSIS — E1142 Type 2 diabetes mellitus with diabetic polyneuropathy: Secondary | ICD-10-CM

## 2016-09-11 DIAGNOSIS — N183 Chronic kidney disease, stage 3 unspecified: Secondary | ICD-10-CM

## 2016-09-11 DIAGNOSIS — N179 Acute kidney failure, unspecified: Secondary | ICD-10-CM

## 2016-09-11 DIAGNOSIS — Z794 Long term (current) use of insulin: Secondary | ICD-10-CM

## 2016-09-11 DIAGNOSIS — N1831 Chronic kidney disease, stage 3a: Secondary | ICD-10-CM

## 2016-09-11 DIAGNOSIS — E876 Hypokalemia: Secondary | ICD-10-CM

## 2016-09-11 LAB — GLUCOSE, CAPILLARY
Glucose-Capillary: 140 mg/dL — ABNORMAL HIGH (ref 65–99)
Glucose-Capillary: 136 mg/dL — ABNORMAL HIGH (ref 65–99)
Glucose-Capillary: 154 mg/dL — ABNORMAL HIGH (ref 65–99)
Glucose-Capillary: 134 mg/dL — ABNORMAL HIGH (ref 65–99)

## 2016-09-11 LAB — BASIC METABOLIC PANEL
Anion gap: 7 (ref 5–15)
BUN: 27 mg/dL — ABNORMAL HIGH (ref 6–20)
CO2: 27 mmol/L (ref 22–32)
Calcium: 9.1 mg/dL (ref 8.9–10.3)
Chloride: 107 mmol/L (ref 101–111)
Creatinine, Ser: 1.95 mg/dL — ABNORMAL HIGH (ref 0.61–1.24)
GFR calc Af Amer: 42 mL/min — ABNORMAL LOW (ref >60)
GFR calc non Af Amer: 36 mL/min — ABNORMAL LOW (ref >60)
Glucose, Bld: 128 mg/dL — ABNORMAL HIGH (ref 65–99)
Potassium: 3.4 mmol/L — ABNORMAL LOW (ref 3.5–5.1)
Sodium: 141 mmol/L (ref 135–145)

## 2016-09-11 NOTE — Progress Notes (Signed)
[facility] PHYSICAL MEDICINE & REHABILITATION     PROGRESS NOTE    Subjective/Complaints: Pt seen sitting up in bed this AM.  He states he slept well and had a good night.  Pt with several questions, verbose, and tangential at times.   ROS: Denies nausea, vomiting, diarrhea, shortness of breath or chest pain  Objective: Vital Signs: Blood pressure 138/68, pulse 68, temperature 97.2 F (36.2 C), temperature source Oral, resp. rate 18, height 6' 3"  (1.905 m), weight (!) 138.3 kg (305 lb), SpO2 97 %. No results found.  Recent Labs  09/09/16 0701  WBC 9.0  HGB 12.9*  HCT 39.4  PLT 204    Recent Labs  09/09/16 0701 09/11/16 0606  NA 143 141  K 3.2* 3.4*  CL 106 107  GLUCOSE 124* 128*  BUN 18 27*  CREATININE 1.62* 1.95*  CALCIUM 9.1 9.1   CBG (last 3)   Recent Labs  09/10/16 1649 09/10/16 2114 09/11/16 0655  GLUCAP 142* 165* 140*    Wt Readings from Last 3 Encounters:  09/08/16 (!) 138.3 kg (305 lb)  09/08/16 136 kg (299 lb 13.2 oz)  03/03/14 131.5 kg (290 lb)    Physical Exam:  Constitutional: He appears well-developed and well-nourished. NAD. HENT: Normocephalic and atraumatic.  Eyes: EOMI. No discharge.  Cardiovascular: RRR. No JVD. Respiratory: CTA B. Unlabored.  GI: Soft. Bowel sounds are normal.   Musculoskeletal: No tenderness.  BLE with stasis changes and healed lesions.  Min edema RLE > LLE and right hand.  Plantar flexor contracture right heel present Neurological: He is alert and oriented, but unable to recall names.  Right facial weakness  Able to follow commands without difficulty.  LUE/LLE: 5/5 RUE 0/5  RLE HF 3/5 KE 3/5 2/5 ADF/PF Skin: Skin is warm and dry. Intact. Psychiatric: Tangential, verbose.  Assessment/Plan: 1. Functional and mobility deficits secondary to increased myoclonus/spastic right hemiparesis after remote CVA which require 3+ hours per day of interdisciplinary therapy in a comprehensive inpatient rehab  setting. Physiatrist is providing close team supervision and 24 hour management of active medical problems listed below. Physiatrist and rehab team continue to assess barriers to discharge/monitor patient progress toward functional and medical goals.  Function:  Bathing Bathing position   Position: Wheelchair/chair at sink  Bathing parts Body parts bathed by patient: Chest, Abdomen, Front perineal area, Left lower leg, Right lower leg, Left upper leg, Right upper leg Body parts bathed by helper: Buttocks, Back  Bathing assist Assist Level: Touching or steadying assistance(Pt > 75%)      Upper Body Dressing/Undressing Upper body dressing   What is the patient wearing?: Pull over shirt/dress     Pull over shirt/dress - Perfomed by patient: Thread/unthread left sleeve, Thread/unthread right sleeve, Put head through opening Pull over shirt/dress - Perfomed by helper: Pull shirt over trunk        Upper body assist Assist Level: Touching or steadying assistance(Pt > 75%)      Lower Body Dressing/Undressing Lower body dressing   What is the patient wearing?: Shoes, Pants       Pants- Performed by helper: Thread/unthread left pants leg, Thread/unthread right pants leg, Pull pants up/down   Non-skid slipper socks- Performed by helper: Don/doff right sock, Don/doff left sock     Shoes - Performed by patient: Don/doff left shoe, Don/doff right shoe            Lower body assist Assist for lower body dressing:  (Max I)  Toileting Toileting   Toileting steps completed by patient: Adjust clothing prior to toileting   Toileting Assistive Devices: Toilet aid (urinal)  Toileting assist Assist level: No help/no cues   Transfers Chair/bed transfer   Chair/bed transfer method: Stand pivot Chair/bed transfer assist level: Touching or steadying assistance (Pt > 75%) Chair/bed transfer assistive device: Medical sales representative     Max distance: 63f Assist level:  Touching or steadying assistance (Pt > 75%)   Wheelchair   Type: Manual Max wheelchair distance: 1580f Assist Level: Supervision or verbal cues  Cognition Comprehension Comprehension assist level: Understands basic 75 - 89% of the time/ requires cueing 10 - 24% of the time  Expression Expression assist level: Expresses basic 75 - 89% of the time/requires cueing 10 - 24% of the time. Needs helper to occlude trach/needs to repeat words.  Social Interaction Social Interaction assist level: Interacts appropriately with others with medication or extra time (anti-anxiety, antidepressant).  Problem Solving Problem solving assist level: Solves basic 75 - 89% of the time/requires cueing 10 - 24% of the time  Memory Memory assist level: Recognizes or recalls 75 - 89% of the time/requires cueing 10 - 24% of the time   Medical Problem List and Plan:  1. 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 DVT Prophylaxis/Anticoagulation: Pharmaceutical: Lovenox  3. Pain Management: N/A  4. Mood: LCSW to follow for evaluation and support.  5. Neuropsych: This patient is not fully capable of making decisions on his own behalf.  6. Skin/Wound Care: routine pressure relief measures. TEDs for LE edema control.  7. Fluids/Electrolytes/Nutrition: encourage PO 8. HTN:   -On catapres, labetalol, hydralazine, HCTZ, cardura and lisinopril   -increased catapres to BID on 2/17  Controlled 2/19 9.T2DM with neuropathy and nephropathy: Monitor BS ac/hs. Continue lantus 20 units bid--metformin held due to CKD.   Relatively controlled 2/19 10. CKD: Monitor renal status serially. Baseline SCr- 1.7  With AKI, Cr 1.95 on 2/19  Encourage fluids 11. Hypokalemia:   Cont supplementation  postassium 3.4 on 2/19 12. Chronic constipation:  senna at nights 13. Spastic hemiparesis with myoclonus:   Continue klonopin 0.51m251mid   Will  consider baclofen trial if necessary  LOS (Days) 3 A FACE TO FACE EVALUATION WAS PERFORMED  Sonia Bromell AniLorie PhenixD 09/11/2016 9:38 AM

## 2016-09-11 NOTE — IPOC Note (Signed)
Overall Plan of Care [facility]) Patient Details Name: Eduardo Young MRN: [id number] DOB: July 07, 1956  Admitting Diagnosis: CVA  Hospital Problems: Principal Problem:   Myoclonus Active Problems:   Weakness   Seizures (HCC)   Spastic hemiparesis of right dominant side (HCC)   Benign essential HTN   Type 2 diabetes mellitus with peripheral neuropathy (HCC)   Stage 3 chronic kidney disease   AKI (acute kidney injury) ([place])   Hypokalemia     Functional Problem List: Nursing Bladder, Bowel, Edema  PT Balance, Behavior, Endurance, Motor, Nutrition, Safety, Perception, Sensory, Skin Integrity  OT Balance, Cognition, Motor, Endurance, Safety  SLP    TR         Basic ADL's: OT Grooming, Bathing, Dressing, Toileting     Advanced  ADL's: OT Simple Meal Preparation     Transfers: PT Bed Mobility, Bed to Chair, Car, Floor, Patent attorney, Agricultural engineer: PT Ambulation, Emergency planning/management officer, Stairs     Additional Impairments: OT Fuctional Use of Upper Extremity  SLP        TR      Anticipated Outcomes Item Anticipated Outcome  Self Feeding modified independent  Swallowing      Basic self-care  supervision  Toileting  supervision   Bathroom Transfers supervision  Bowel/Bladder  Min A   Transfers  Supervision assist with LRAD   Locomotion  Mod I at [facility] level.   Communication     Cognition     Pain  <3  Safety/Judgment  Supervision   Therapy Plan: PT Intensity: Minimum of 1-2 x/day ,45 to 90 minutes PT Frequency: 5 out of 7 days PT Duration Estimated Length of Stay: 7-10 days  OT Intensity: Minimum of 1-2 x/day, 45 to 90 minutes OT Frequency: 5 out of 7 days OT Duration/Estimated Length of Stay: 7-9 days         Team Interventions: Nursing Interventions Bladder Management, Disease Management/Prevention, Medication Management, Discharge Planning  PT interventions Ambulation/gait training, Balance/vestibular training, Cognitive  remediation/compensation, Community reintegration, Discharge planning, DME/adaptive equipment instruction, Functional electrical stimulation, Disease management/prevention, Functional mobility training, Neuromuscular re-education, Pain management, Patient/family education, Psychosocial support, Skin care/wound management, Splinting/orthotics, Therapeutic Exercise, Therapeutic Activities, Stair training, UE/LE Strength taining/ROM, UE/LE Coordination activities, Visual/perceptual remediation/compensation, Wheelchair propulsion/positioning  OT Interventions Training and development officer, Cognitive remediation/compensation, Community reintegration, Discharge planning, Functional electrical stimulation, Functional mobility training, Neuromuscular re-education, Psychosocial support, Patient/family education, Self Care/advanced ADL retraining, Therapeutic Activities, UE/LE Coordination activities, Therapeutic Exercise, UE/LE Strength taining/ROM  SLP Interventions    TR Interventions    SW/CM Interventions Discharge Planning, Psychosocial Support, Patient/Family Education    Team Discharge Planning: Destination: PT-Home ,OT- Home , SLP-  Projected Follow-up: PT-Home health PT, OT-  24 hour supervision/assistance, SLP-  Projected Equipment Needs: PT-To be determined, OT- To be determined, SLP-  Equipment Details: PT-has HW and WC , OT-  Patient/family involved in discharge planning: PT- Patient,  OT-Patient, SLP-   MD ELOS: 7-10 days. Medical Rehab Prognosis:  Good Assessment:  61 y.o. 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 MRI with old infarct in left hemisphere affecting thalamus, basal gangli and deep white matter. Patient with reports of intermittent uncontrolled movements of RLE followed by weakness. Therapy evaluations done and patient noted to have limitations in  mobility and self care tasks. Will set goals for supervision for most talks, Mod I  for wheelchair mobility with PT/OT.    See Team Conference Notes for weekly updates to the plan of care

## 2016-09-11 NOTE — Progress Notes (Signed)
Patient information reviewed and entered into eRehab system by Rajesh Wyss, RN, CRRN, PPS Coordinator.  Information including medical coding and functional independence measure will be reviewed and updated through discharge.     Per nursing patient was given "Data Collection Information Summary for Patients in Inpatient Rehabilitation Facilities with attached "Privacy Act Statement-Health Care Records" upon admission.  

## 2016-09-11 NOTE — Progress Notes (Signed)
Social Work Assessment and Plan Social Work Assessment and Plan  Patient Details  Name: Eduardo Young MRN: [id number] Date of Birth: [birth year]  Today's Date: 09/11/2016  Problem List:  Patient Active Problem List   Diagnosis Date Noted  . Benign essential HTN   . Type 2 diabetes mellitus with peripheral neuropathy (HCC)   . Stage 3 chronic kidney disease   . AKI (acute kidney injury) ([place])   . Hypokalemia   . Spastic hemiparesis of right dominant side ([place]) 09/08/2016  . Weakness   . Myoclonus   . Seizures ([place])   . TIA (transient ischemic attack) 09/07/2016  . Chronic venous insufficiency   . History of cerebral infarction ([place]) 10/21/2010  . DM (diabetes mellitus) ([place]) 10/21/2010  . HLD (hyperlipidemia) 10/21/2010  . HTN (hypertension) 10/21/2010  . Sleep apnea 10/21/2010   Past Medical History:  Past Medical History:  Diagnosis Date  . Chronic venous insufficiency   . Diabetes mellitus   . GERD (gastroesophageal reflux disease)   . Gout   . Hemiparesis ([place])   . Hyperlipidemia   . Hypertension   . Obesity   . Renal calculi   . Sleep apnea, obstructive   . Stroke [facility])    [id number]   Past Surgical History:  Past Surgical History:  Procedure Laterality Date  . LITHOTRIPSY    . NASAL POLYP EXCISION     Social History:  reports that he has quit smoking. He has never used smokeless tobacco. He reports that he drinks alcohol. He reports that he uses drugs, including Cocaine and Marijuana.  Family / Support Systems Marital Status: Divorced How Long?: years Patient Roles: Other (Comment) (Sibling) Other Supports: [employer] [phone]  [phone] Anticipated Caregiver: Sister can provide assist when not working she works 24 hr 2-3 days per week Ability/Limitations of Caregiver: She does work for EMS in [employer] hours are different week to week Caregiver Availability: Intermittent Family Dynamics: Close knit with sister and brother, he hopes to  be mod/i so when his sister is working he will be safe at home. He does have a few friends who can come by and check on him.  Social History Preferred language: English Religion: Non-Denominational Cultural Background: No issues Education: Some College Classes Read: Yes Write: Yes Employment Status: Disabled Date Retired/Disabled/Unemployed: 2008 Freight forwarder Issues: No issues Guardian/Conservator: none-according to MD pt is capable of making his own decisions while here.   Abuse/Neglect Physical Abuse: Denies Verbal Abuse: Denies Sexual Abuse: Denies Exploitation of patient/patient's resources: Denies Self-Neglect: Denies  Emotional Status Pt's affect, behavior adn adjustment status: Pt is motivated to do well and can see some progress since coming into the hospital. He needs to be mod/i so when sister works he will be safe home alone. He will do whatever he needs to do to achieve these goals. Recent Psychosocial Issues: Other health issues-residual deficits from first CVA in 2008 Pyschiatric History: Has had a history of depression but feels lately he has been doing well and coping well. Discussed having neuro-psych see while here for emotional support, he will think about this and get back with this worker. Substance Abuse History: No issues  Patient / Family Perceptions, Expectations & Goals Pt/Family understanding of illness & functional limitations: Pt and sister can explain his stroke and new deficits. He is making progress which is encouraging to him. He does talk with the MD and feels he has a good understanding of his treatment plan. Sister is an ENT and  aware of his condition. 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, home owner, friend, retiree, etc Anticipated changes in roles/activities/participation: resume Pt/family expectations/goals: Pt states: " I want to be mobile and safe to be alone while my sister works."  Sister states: " I hope he does well  here and can be safe, that is the main issue."  [employer]: Other (Comment) (Been to [place] in 2008) Premorbid Home Care/DME Agencies: Other (Comment) (had HH and equipment in the past) Transportation available at discharge: Sister Resource referrals recommended: Neuropsychology, Support group (specify)  Discharge Planning Living Arrangements: Other relatives Support Systems: Other relatives, Friends/neighbors Type of Residence: Private residence Insurance Resources: [employer] Financial Resources: SSD, Family Support Financial Screen Referred: No Living Expenses: Own Money Management: Patient, Family Does the patient have any problems obtaining your medications?: No Home Management: Pt and sister help one another Patient/Family Preliminary Plans: Return home with sister who is there intermittently due to her EMS job in [place]. He does have friends who can check on him while she is working. He is used to adapting to his function he feels he will need to do this again. Will await his progress while here. Social Work Anticipated Follow Up Needs: HH/OP, Support Group  Clinical Impression Pleasant gentleman who is willing to work hard to reach the goals he has set for himself. He has been though this before and had to go to a nursing home, which he will not do again. His sister is supportive and involved And can assist him some but does work and has different hours due to his a ENT. Will work on a safe discharge plan and encourage them to get a cell phone for pt to keep close or lifeline, due to when he has fallen he has laid There for a long period of time. Consult pt regarding seeing neuro-psych while here, he wants to think about and get back with this worker.  Elease Hashimoto 09/11/2016, 1:47 PM

## 2016-09-11 NOTE — Care Management Note (Signed)
[facility] Individual Statement of Services  Patient Name:  Eduardo Young  Date:  09/11/2016  Welcome to the [facility].  Our goal is to provide you with an individualized program based on your diagnosis and situation, designed to meet your specific needs.  With this comprehensive rehabilitation program, you will be expected to participate in at least 3 hours of rehabilitation therapies Monday-Friday, with modified therapy programming on the weekends.  Your rehabilitation program will include the following services:  Physical Therapy (PT), Occupational Therapy (OT), Speech Therapy (ST), 24 hour per day rehabilitation nursing, Therapeutic Recreaction (TR), Neuropsychology, Case Management (Social Worker), Rehabilitation Medicine, Nutrition Services and Pharmacy Services  Weekly team conferences will be held on Wednesday to discuss your progress.  Your Social Worker will talk with you frequently to get your input and to update you on team discussions.  Team conferences with you and your family in attendance may also be held.  Expected length of stay: 7-9 days  Overall anticipated outcome: supervision-mod/i wheelchair level  Depending on your progress and recovery, your program may change. Your Social Worker will coordinate services and will keep you informed of any changes. Your Social Worker's name and contact numbers are listed  below.  The following services may also be recommended but are not provided by the [facility]:    [place] will be made to provide these services after discharge if needed.  Arrangements include referral to agencies that provide these services.  Your insurance has been verified to be:  Medicare Your primary doctor is:  Jenna Luo  Pertinent information will be shared with your doctor and your insurance  company.  Social Worker:  Ovidio Kin, [place] or ([phone]  Information discussed with and copy given to patient by: Elease Hashimoto, 09/11/2016, 1:15 PM

## 2016-09-11 NOTE — Progress Notes (Signed)
Physical Therapy Session Note  Patient Details  Name: Eduardo Young MRN: [id number] Date of Birth: [birth year]  Today's Date: 09/11/2016 PT Individual Time: 1005-1108 and 13:50-1505 PT Individual Time Calculation (min): 63 min and 75 min   Short Term Goals: Week 1:  PT Short Term Goal 1 (Week 1): STG =LTG due to ELOS   Skilled Therapeutic Interventions/Progress Updates:  Tx focused on functional mobility training, gait with hall rail, and NMR via forced use, manual facilitation, and multi-modal cues including mirror. Pt up in [facility], ready to go, with RUE dangling between LEs and large sulcus sign visible >3. Pt educated on importance of supporting RUE in all positions. Pt has strong views of continuing to "do things" the way he has always done, including retro WC propulsion, transfers and gait in compensatory ways.   Pt propelled WC 2x150' backwards in controlled setting with S and safety cues to avoid obstacles. Pt educated on safety implications for not visualizing obstacles and people.   Sit<>stand x5 throughout with min-guard A, pt requesting no physical assistance, but educated on safety of current situation. All static and transitional movements marked by minimal use of RLE, and thus minimal LLE foot clearance for any steps. Stand-step transfer with hemi-walker x2 with unsafe turn and instability.   NMR throughout to work toward midline orientation with a mirror and manual facilitation for R weight shift. Pt is highly resistance to any shift in balance due to longstanding patterns of movement. Pt was successful with lateral small weight shifts only with platform walker after unsuccessful attempt at doing so with his hemi walker. Manual facilitation provided for postural alignment and weight shifting.   Gait at hall rail with min A for steadying x8' an d+2 WC follow. Pt unable to adjust gait pattern of forward flexed hips and trunk, decreased R weight shift, and resulting decreased L step  length and foot clearance. Pt declines any attempt at physical assist to adjust movement patterns.  Pt was educated on importance of attempting safe transitional movements.   Pt left up in [facility] with all needs in reach.   Second tx focused on NMR, spasticity treatment, squat-pivot transfers, and gait with hemi-walker. Pt educated again on RUE protection. Unfortunately, no sling is available in his size. Pt given options for positioning and protection in WC.  Pt propelled WC room<>gym forwards for optimal safety of others in the environment.  Pt educated on benefits of squat-pivot transfers, and performed x3 with Mod A and demonstration cues.   Supine<>sit with up to Mod A for lifting to sit and cues for sequence.  Supine NMR via stretching all joints on RLE for increaed functional ROM, especially HS, heel cord, and hip rotators. Rhythmic rocking for tone reduction in RLE with good success.  Pt has no lace-up shoes, thus unable to trial AFO today. Asked if family has any, and he said no. Hopefully they will bring in his [facility] for assessment.   Bridging with yellow ball x2 and cues.   Nustep x9 min with 3 extremities x4 min and bil LEs only x5 min for reciprocal stepping and ROM at level 4.   Gait with hemi-walker x8' with WC follow and min A, cues as above, but pt unable to adjust Pt left up in [facility], all needs in reach.       Therapy Documentation Precautions:  Precautions Precautions: Fall Precaution Comments: R hemiparesis Restrictions Weight Bearing Restrictions: No General:   Vital Signs:   Pain: Pain Assessment Pain  Assessment: No/denies pain Pain Score: 0-No pain   See Function Navigator for Current Functional Status.   Therapy/Group: Individual Therapy  Kerryann Allaire, Corinna Lines, PT, DPT  09/11/2016, 10:56 AM

## 2016-09-11 NOTE — Progress Notes (Signed)
Occupational Therapy Session Note  Patient Details  Name: Eduardo Young MRN: [id number] Date of Birth: 03/03/1956  Today's Date: 09/11/2016 OT Individual Time: 0800-0900 OT Individual Time Calculation (min): 60 min   Short Term Goals: Week 1:  OT Short Term Goal 1 (Week 1): STGs equal to LTGs set at supervision level.  Skilled Therapeutic Interventions/Progress Updates:    1:1  OT session focused on modified bathing/dressing, transfer training, and improved sit<>stand. Pt seated EOB eating breakfast. OT assisted pt with repositioning at EOB for increased stability while finishing breakfast. Pt donned shoes with set-up semi-reclined in bed.  2 trials for sit<>stand with overall min A to achieve full stand. Min guard A stand-pivot transfer to L w/ hemi-walker. Set-up for one-handed grooming tasks including shaving and brushing teeth Modified bathing/dressing sink level with overall Mod/max A for LB self-care. Addressed sit<>stand and standing balance with bathing/dressing tasks. Min A sit<.stand with increased time. Pt unable to maintain standing balance without UE support to reach to wash buttocks at this time. Pt returned to sitting in wc at end of session and left with R LE elevated and needs met.   Therapy Documentation Precautions:  Precautions Precautions: Fall Precaution Comments: R hemiparesis Restrictions Weight Bearing Restrictions: No Pain:  denies pain  See Function Navigator for Current Functional Status.   Therapy/Group: Individual Therapy  Valma Cava 09/11/2016, 8:11 AM

## 2016-09-12 ENCOUNTER — Inpatient Hospital Stay (HOSPITAL_COMMUNITY): Payer: Medicare Other | Admitting: Physical Therapy

## 2016-09-12 ENCOUNTER — Inpatient Hospital Stay (HOSPITAL_COMMUNITY): Payer: Medicare Other | Admitting: Occupational Therapy

## 2016-09-12 LAB — GLUCOSE, CAPILLARY
Glucose-Capillary: 138 mg/dL — ABNORMAL HIGH (ref 65–99)
Glucose-Capillary: 142 mg/dL — ABNORMAL HIGH (ref 65–99)
Glucose-Capillary: 126 mg/dL — ABNORMAL HIGH (ref 65–99)
Glucose-Capillary: 151 mg/dL — ABNORMAL HIGH (ref 65–99)

## 2016-09-12 NOTE — Progress Notes (Signed)
[place] PHYSICAL MEDICINE & REHABILITATION     PROGRESS NOTE    Subjective/Complaints: Pt seen laying in bed this AM.  He states he slept well overnight.  He is watching hockey and states he loves hockey, but it is not aware if [place] made the Olympics.   ROS: Denies nausea, vomiting, diarrhea, shortness of breath or chest pain  Objective: Vital Signs: Blood pressure (!) 133/58, pulse 60, temperature 97.7 F (36.5 C), temperature source Oral, resp. rate 18, height 6' 3"  (1.905 m), weight (!) 138.3 kg (305 lb), SpO2 95 %. No results found. No results for input(s): WBC, HGB, HCT, PLT in the last 72 hours.  Recent Labs  09/11/16 0606  NA 141  K 3.4*  CL 107  GLUCOSE 128*  BUN 27*  CREATININE 1.95*  CALCIUM 9.1   CBG (last 3)   Recent Labs  09/11/16 1701 09/11/16 2011 09/12/16 0640  GLUCAP 154* 134* 138*    Wt Readings from Last 3 Encounters:  09/08/16 (!) 138.3 kg (305 lb)  09/08/16 136 kg (299 lb 13.2 oz)  03/03/14 131.5 kg (290 lb)    Physical Exam:  Constitutional: He appears well-developed and well-nourished. NAD. HENT: Normocephalic and atraumatic.  Eyes: EOMI. No discharge.  Cardiovascular: RRR. No JVD. Respiratory: CTA B. Unlabored.  GI: Soft. Bowel sounds are normal.   Musculoskeletal: No tenderness.  BLE with stasis changes and healed lesions.  Min edema RLE > LLE and right hand.  Neurological: He is alert and oriented with some conufsion Right facial weakness  Able to follow commands without difficulty.  LUE/LLE: 5/5 RUE: 0/5  RLE: HF 3/5 KE 3/5 2/5 ADF/PF MAS: RUE: 1/4, ankle clonus Dysarthria Skin: Skin is warm and dry. Intact. Psychiatric: Tangential, verbose.  Assessment/Plan: 1. 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 Physiatrist is providing close team supervision and 24 hour management  of active medical problems listed below. Physiatrist and rehab team continue to assess barriers to discharge/monitor patient progress toward functional and medical goals.  Function:  Bathing Bathing position   Position: Wheelchair/chair at sink  Bathing parts Body parts bathed by patient: Chest, Abdomen, Front perineal area, Left lower leg, Right lower leg, Left upper leg, Right upper leg Body parts bathed by helper: Buttocks, Back  Bathing assist Assist Level: Touching or steadying assistance(Pt > 75%)      Upper Body Dressing/Undressing Upper body dressing   What is the patient wearing?: Pull over shirt/dress     Pull over shirt/dress - Perfomed by patient: Thread/unthread left sleeve, Thread/unthread right sleeve, Put head through opening Pull over shirt/dress - Perfomed by helper: Pull shirt over trunk        Upper body assist Assist Level: Touching or steadying assistance(Pt > 75%)      Lower Body Dressing/Undressing Lower body dressing   What is the patient wearing?: Shoes, Pants       Pants- Performed by helper: Thread/unthread left pants leg, Thread/unthread right pants leg, Pull pants up/down   Non-skid slipper socks- Performed by helper: Don/doff right sock, Don/doff left sock     Shoes - Performed by patient: Don/doff left shoe, Don/doff right shoe            Lower body assist Assist for lower body dressing:  (Max I)      Toileting Toileting   Toileting steps completed by patient: Adjust clothing prior  to toileting Toileting steps completed by helper: Adjust clothing prior to toileting, Performs perineal hygiene, Adjust clothing after toileting (per Berkley Harvey, NT report) Toileting Assistive Devices: Toilet aid (urinal)  Toileting assist Assist level: No help/no cues   Transfers Chair/bed transfer   Chair/bed transfer method: Stand pivot Chair/bed transfer assist level: Touching or steadying assistance (Pt > 75%) Chair/bed transfer assistive  device: Medical sales representative     Max distance: 8 Assist level: Touching or steadying assistance (Pt > 75%)   Wheelchair   Type: Manual Max wheelchair distance: 147f  Assist Level: Supervision or verbal cues  Cognition Comprehension Comprehension assist level: Understands basic 75 - 89% of the time/ requires cueing 10 - 24% of the time  Expression Expression assist level: Expresses basic 75 - 89% of the time/requires cueing 10 - 24% of the time. Needs helper to occlude trach/needs to repeat words.  Social Interaction Social Interaction assist level: Interacts appropriately with others with medication or extra time (anti-anxiety, antidepressant).  Problem Solving Problem solving assist level: Solves basic 75 - 89% of the time/requires cueing 10 - 24% of the time  Memory Memory assist level: Recognizes or recalls 75 - 89% of the time/requires cueing 10 - 24% of the time   Medical Problem List and Plan:  1. 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 DVT Prophylaxis/Anticoagulation: Pharmaceutical: Lovenox  3. Pain Management: N/A  4. Mood: LCSW to follow for evaluation and support.  5. Neuropsych: This patient is not fully capable of making decisions on his own behalf.  6. Skin/Wound Care: routine pressure relief measures. TEDs for LE edema control.  7. Fluids/Electrolytes/Nutrition: encourage PO 8. HTN:   -On catapres, labetalol, hydralazine, HCTZ, cardura and lisinopril   -increased catapres to BID on 2/17  Controlled 2/20 9.T2DM with neuropathy and nephropathy: Monitor BS ac/hs. Continue lantus 20 units bid--metformin held due to CKD.   Relatively controlled 2/20 10. CKD: Monitor renal status serially. Baseline SCr- 1.7  With AKI, Cr 1.95 on 2/19  Cont to monitor  Encourage fluids 11. Hypokalemia:   Cont supplementation  postassium 3.4 on 2/19 12. Chronic constipation:  senna at nights 13. Spastic hemiparesis with myoclonus:   Continue klonopin  0.529mbid   Will consider baclofen trial if necessary  LOS (Days) 4 A FACE TO FACE EVALUATION WAS PERFORMED  Barnell Shieh AnLorie PhenixMD 09/12/2016 8:46 AM

## 2016-09-12 NOTE — Progress Notes (Signed)
Physical Therapy Session Note  Patient Details  Name: Eduardo Young MRN: [id number] Date of Birth: [birth year]  Today's Date: 09/12/2016 PT Individual Time: [phone] and 2010-0712 PT Individual Time Calculation (min): 78 min and 70 min  Short Term Goals: Week 1:  PT Short Term Goal 1 (Week 1): STG =LTG due to ELOS   Skilled Therapeutic Interventions/Progress Updates:  Session 1: Pt was lying in bed upon arrival and was willing to participate in therapy. Pt donned bilateral shoes in supine independently. Pt required supervision with transferring to EOB using handrails. Pt performed stand pivot and sit-to-stand transfers CGA throughout therapy using hemiwalker but required significantly more time to complete tasks due to reduced weight shift over RLE. Pt propelled to gym in W/C using L sided hemi-method 160 ft supervision with several rest breaks and verbal cues for technique. Pt performed 2 bouts of standing balance exercises Min A with hemiwalker with reaching activity anteriorly and slightly to the right to promote increased weight shift onto the RLE. Pt required encouragement to perform task due to fear of falling when reaching out. Pt performed 2 bouts of reaching activity using LUE to the R on EOM to increase weight shift onto RUE and over RLE to improve dynamic sitting balance. Pt performed 10 sit-to-stand exercises with box under LLE to increase weight shift onto RLE. Pt initially only able to reach squat position but steadily increased to more upright posture with pt maintaining upright position for 25 sec before fatiguing. Pt ambulated 11 ft Mod A using hallway rail and requiring W/C follow. Pt's gait revealed step to pattern with RLE lacking necessary DF and ability to accept weight to allow complete swing through of LLE. Pt was returned to room and left sitting up in W/C with all needs within reach.   Session 2:  Pt was sitting up in W/C upon arrival with pt's sister, Manuela Schwartz, present in the  room. Pt was complaining of no pain during the PT session. PT included pt and family education on discharge plan, POC, therapy goals, potential DME needs, and follow up HHPT vs Outpatient PT. Pt propelled 160 ft supervision in W/C using L sided hemi-method with 1 rest break. Pt ambulated 6 ft with hemiwalker close supervision with w/c follow. Pt performed 3 sit-to-stand and 5 ambulatory transfers with hemiwalker supervision and with extra time due to reduced weight shift over RLE and pt's fear of falling throughout therapy. Pt performed 9 min on Nustep level 4 with BLE and LUE only. Pt was returned to room and left sitting up in W/C with all needs within reach.      Therapy Documentation Precautions:  Precautions Precautions: Fall Precaution Comments: R hemiparesis Restrictions Weight Bearing Restrictions: No Pain: Pain Assessment Pain Assessment: No/denies pain Pain Score: 0-No pain   See Function Navigator for Current Functional Status.   Therapy/Group: Individual Therapy  [name] 09/12/2016, 7:49 AM

## 2016-09-12 NOTE — Progress Notes (Signed)
Occupational Therapy Note  Patient Details  Name: Eduardo Young MRN: [id number] Date of Birth: 1956/04/01  Today's Date: 09/12/2016 OT Individual Time: 1100-1200 OT Individual Time Calculation (min): 60 min   No c/o pain   1:1 Self care retraining at shower level with focus on stand pivot transfers, standing functional balance, etc. Pt able to perform stand pivot transfer with grab bar with min A with more than reasonable amt of time. Focus on sit to stands and standing balance without UE support for clothing management. Pt with labored breathing and increased perspiring with physical exertion. Pt able to move around room in w/c mod I obtain items for self care.    Willeen Cass [facility] 09/12/2016, 2:21 PM

## 2016-09-13 ENCOUNTER — Inpatient Hospital Stay (HOSPITAL_COMMUNITY): Payer: Medicare Other | Admitting: Occupational Therapy

## 2016-09-13 ENCOUNTER — Inpatient Hospital Stay (HOSPITAL_COMMUNITY): Payer: Medicare Other | Admitting: Physical Therapy

## 2016-09-13 ENCOUNTER — Encounter (HOSPITAL_COMMUNITY): Payer: Self-pay | Admitting: Psychology

## 2016-09-13 ENCOUNTER — Encounter (HOSPITAL_COMMUNITY): Payer: Medicare Other | Admitting: Psychology

## 2016-09-13 ENCOUNTER — Inpatient Hospital Stay (HOSPITAL_COMMUNITY): Payer: Medicare Other | Admitting: *Deleted

## 2016-09-13 DIAGNOSIS — F4323 Adjustment disorder with mixed anxiety and depressed mood: Secondary | ICD-10-CM

## 2016-09-13 LAB — GLUCOSE, CAPILLARY
Glucose-Capillary: 122 mg/dL — ABNORMAL HIGH (ref 65–99)
Glucose-Capillary: 122 mg/dL — ABNORMAL HIGH (ref 65–99)
Glucose-Capillary: 116 mg/dL — ABNORMAL HIGH (ref 65–99)
Glucose-Capillary: 159 mg/dL — ABNORMAL HIGH (ref 65–99)

## 2016-09-13 NOTE — Progress Notes (Signed)
Occupational Therapy Session Note  Patient Details  Name: SERIGNE KUBICEK MRN: [id number] Date of Birth: 18-Jun-1956  Today's Date: 09/13/2016 OT Individual Time: 1105-1205 OT Individual Time Calculation (min): 60 min    Short Term Goals: Week 1:  OT Short Term Goal 1 (Week 1): STGs equal to LTGs set at supervision level.  Skilled Therapeutic Interventions/Progress Updates:    1:1 OT session focused on modified bathing/dressing techniques, improved sit<>stand, standing balance/endurance. Stand-step turn transfer from bed >w/c using hemi-walker. Min A sit<>stand, CGA step turn. Discussed goals of care for pt and modifications at hom to increase independence with daily self-care tasks. Discussed options for increased independence with toileting including use of urinal, and wide drop-arm BSC instead of going in brief. Pt agreeable. Modified bathing/dressing completed with overall Min A + sit<>stand at the sink. Worked on dynamic standing balance and attempting to reach behind to wash buttocks. Pt unable to maintain standing balance without UE support to wash buttocks. Pt shaved seated in wc with set-up A, donned shirt w/ set-up, and left seated in wc at end of session with needs met.   Therapy Documentation Precautions:  Precautions Precautions: Fall Precaution Comments: R hemiparesis Restrictions Weight Bearing Restrictions: No Pain:  denies pain   See Function Navigator for Current Functional Status.   Therapy/Group: Individual Therapy  Valma Cava 09/13/2016, 11:41 AM

## 2016-09-13 NOTE — Progress Notes (Signed)
Social Work Patient ID: Eduardo Young, male   DOB: 1956/01/04, 61 y.o.   MRN: [id number]  Met with pt and left message for sister to discuss team conference goals supervision level and target discharge 2/25. Pt is agreeable to OP therapies to get him out of the home. He is too isolated at home and has had home health before and not much room at his home. Will work toward discharge Sun.

## 2016-09-13 NOTE — Progress Notes (Signed)
Physical Therapy Session Note  Patient Details  Name: Eduardo Young MRN: [id number] Date of Birth: 06-Jun-1956  Today's Date: 09/13/2016 PT Individual Time:1300-1405 and  1500-1610 PT Individual Time Calculation (min): 65 min and 70 min    Short Term Goals: Week 1:  PT Short Term Goal 1 (Week 1): STG =LTG due to ELOS   Skilled Therapeutic Interventions/Progress Updates:  Session 1:  Pt was sitting up in W/C upon arrival and was willing to participate in PT . Pt self-propelled W/C 157 ft using L sided hemi method supervision. Pt performed stand pivot and ambulatory transfers with hemiwalker supervision throughout therapy. Pt performed 10 min on Nustep level 4 with RLE leg attachment. Pt ambulated 6 ft to EOM using hemiwalker with supervision. Pt performed 8 and 6 minutes of standing tolerance in standing frame with step under LLE and verbal and visual cues for midline orientation to facilitate dincrease weight shift over RLE. Pt was returned to room and left sitting up in W/C with all needs within reach.   Session 2: Pt was sitting upright in W/C upon arrival. Pt performed 2 car transfers at sedan height while using the hemiwalker. Pt was supervision with transitioning from W/C to the seat and turning in and out of the car. Pt required min A to stand up from car seat when not allowed to pull from car door frame. Pt self-propelled W/C 100 ft to rehab gym using L sided hemi method supervision level. Pt shot basketball from seated position and then in standing with hemiwalker and CGA for 12 min to focus on static balance and standing tolerance. Pt practiced standing balance using hemiwalker, mirror for visual feedback, and min A to facilitate anterior/lateral hip shifting to neutral to promote upright posture and activation of knee musculature to protect R knee joint. Pt completed 8 min, 6 min, 6 min, 3 min, 4 min, 2 min of standing balance and required encouragement and consistent cueing to attempt  activity and move out of compensatory patterns developed after stroke 9 years ago. Pt was returned to room and left sitting up in W/C with all needs within reach.      Therapy Documentation Precautions:  Precautions Precautions: Fall Precaution Comments: R hemiparesis Restrictions Weight Bearing Restrictions: No Pain: Pain Assessment Pain Assessment: No/denies pain Pain Score: 0-No pain   See Function Navigator for Current Functional Status.   Therapy/Group: Individual Therapy  [name] 09/13/2016, 8:11 AM

## 2016-09-13 NOTE — Progress Notes (Signed)
[facility] PHYSICAL MEDICINE & REHABILITATION     PROGRESS NOTE    Subjective/Complaints: Pt seen laying in bed this AM.  He slept well overnight, but is still sleepy this AM.    ROS: Denies nausea, vomiting, diarrhea, shortness of breath or chest pain  Objective: Vital Signs: Blood pressure 127/70, pulse (!) 58, temperature 98 F (36.7 C), temperature source Oral, resp. rate 18, height 6' 3"  (1.905 m), weight (!) 138.3 kg (305 lb), SpO2 97 %. No results found. No results for input(s): WBC, HGB, HCT, PLT in the last 72 hours.  Recent Labs  09/11/16 0606  NA 141  K 3.4*  CL 107  GLUCOSE 128*  BUN 27*  CREATININE 1.95*  CALCIUM 9.1   CBG (last 3)   Recent Labs  09/12/16 1655 09/12/16 2030 09/13/16 0635  GLUCAP 126* 151* 122*    Wt Readings from Last 3 Encounters:  09/08/16 (!) 138.3 kg (305 lb)  09/08/16 136 kg (299 lb 13.2 oz)  03/03/14 131.5 kg (290 lb)    Physical Exam:  Constitutional: He appears well-developed and well-nourished. NAD. HENT: Normocephalic and atraumatic.  Eyes: EOMI. No discharge.  Cardiovascular: RRR. No JVD. Respiratory: CTA B. Unlabored.  GI: Soft. Bowel sounds are normal.   Musculoskeletal: No tenderness.  BLE with stasis changes and healed lesions.  Min edema RLE > LLE and right hand.  Neurological: He is alert and oriented with some conufsion Right facial weakness  Able to follow commands without difficulty.  LUE/LLE: 5/5 RUE: 0/5 (unchanged) RLE: HF 3/5 KE 3/5 2/5 ADF/PF MAS: RUE: 1/4, ankle clonus Dysarthria Skin: Skin is warm and dry. Intact. Psychiatric: Tangential, verbose.  Assessment/Plan: 1. 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 Physiatrist is providing close team supervision and 24 hour management of active medical problems listed below. Physiatrist and rehab  team continue to assess barriers to discharge/monitor patient progress toward functional and medical goals.  Function:  Bathing Bathing position   Position: Wheelchair/chair at sink  Bathing parts Body parts bathed by patient: Chest, Abdomen, Front perineal area, Left lower leg, Right lower leg, Left upper leg, Right upper leg, Right arm, Left arm Body parts bathed by helper: Buttocks, Back  Bathing assist Assist Level: Touching or steadying assistance(Pt > 75%)      Upper Body Dressing/Undressing Upper body dressing   What is the patient wearing?: Pull over shirt/dress     Pull over shirt/dress - Perfomed by patient: Thread/unthread left sleeve, Thread/unthread right sleeve, Put head through opening Pull over shirt/dress - Perfomed by helper: Pull shirt over trunk        Upper body assist Assist Level: Touching or steadying assistance(Pt > 75%)      Lower Body Dressing/Undressing Lower body dressing   What is the patient wearing?: Shoes     Pants- Performed by patient: Thread/unthread right pants leg, Thread/unthread left pants leg, Pull pants up/down Pants- Performed by helper: Thread/unthread left pants leg, Thread/unthread right pants leg, Pull pants up/down Non-skid slipper socks- Performed by patient: Don/doff right sock, Don/doff left sock Non-skid slipper socks- Performed by helper: Don/doff right sock, Don/doff left sock     Shoes - Performed by patient: Don/doff left shoe, Don/doff right shoe            Lower body assist Assist for lower body dressing: Supervision or verbal cues  Toileting Toileting   Toileting steps completed by patient: Adjust clothing prior to toileting Toileting steps completed by helper: Adjust clothing prior to toileting, Performs perineal hygiene, Adjust clothing after toileting (per Berkley Harvey, NT report) Toileting Assistive Devices: Toilet aid (urinal)  Toileting assist Assist level: No help/no cues   Transfers Chair/bed  transfer   Chair/bed transfer method: Ambulatory Chair/bed transfer assist level: Supervision or verbal cues Chair/bed transfer assistive device: Walker, Air cabin crew     Max distance: 11 ft Assist level: Moderate assist (Pt 50 - 74%)   Wheelchair   Type: Manual Max wheelchair distance: 160 ft Assist Level: Supervision or verbal cues  Cognition Comprehension Comprehension assist level: Understands basic 90% of the time/cues < 10% of the time  Expression Expression assist level: Expresses basic 75 - 89% of the time/requires cueing 10 - 24% of the time. Needs helper to occlude trach/needs to repeat words.  Social Interaction Social Interaction assist level: Interacts appropriately with others with medication or extra time (anti-anxiety, antidepressant).  Problem Solving Problem solving assist level: Solves basic 75 - 89% of the time/requires cueing 10 - 24% of the time  Memory Memory assist level: Recognizes or recalls 75 - 89% of the time/requires cueing 10 - 24% of the time   Medical Problem List and Plan:  1. 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 DVT Prophylaxis/Anticoagulation: Pharmaceutical: Lovenox  3. Pain Management: N/A  4. Mood: LCSW to follow for evaluation and support.  5. Neuropsych: This patient is not fully capable of making decisions on his own behalf.  6. Skin/Wound Care: routine pressure relief measures. TEDs for LE edema control.  7. Fluids/Electrolytes/Nutrition: encourage PO 8. HTN:   On catapres, labetalol, hydralazine, HCTZ, cardura and lisinopril   -increased catapres to BID on 2/17  Controlled 2/21 9.T2DM with neuropathy and nephropathy: Monitor BS ac/hs. Continue lantus 20 units bid--metformin held due to CKD.   Relatively controlled 2/21 10. CKD: Monitor renal status serially. Baseline SCr- 1.7  With AKI, Cr 1.95 on 2/19  Cont to monitor  Encourage fluids  Labs ordered for tomorrow 11. Hypokalemia:   Cont  supplementation  postassium 3.4 on 2/19  Labs ordered for tomorrow 12. Chronic constipation:  senna at nights 13. Spastic hemiparesis with myoclonus:   Continue klonopin 0.43m bid   Will consider baclofen trial if necessary  LOS (Days) 5 A FACE TO FACE EVALUATION WAS PERFORMED  [place] ALorie Phenix MD 09/13/2016 10:53 AM

## 2016-09-13 NOTE — Evaluation (Signed)
Recreational Therapy Assessment and Plan  Patient Details  Name: Eduardo Young MRN: [id number] Date of Birth: 04-05-1956 Today's Date: 09/13/2016  Rehab Potential: Good  ELOS: 10 days  Assessment   Problem List:      Patient Active Problem List   Diagnosis Date Noted  . Spastic hemiparesis of right dominant side ([place]) 09/08/2016  . Weakness   . Myoclonus   . Seizures ([place])   . TIA (transient ischemic attack) 09/07/2016  . Chronic venous insufficiency   . History of cerebral infarction ([place]) 10/21/2010  . DM (diabetes mellitus) ([place]) 10/21/2010  . HLD (hyperlipidemia) 10/21/2010  . HTN (hypertension) 10/21/2010  . Sleep apnea 10/21/2010    Past Medical History:      Past Medical History:  Diagnosis Date  . Chronic venous insufficiency   . Diabetes mellitus   . GERD (gastroesophageal reflux disease)   . Gout   . Hemiparesis ([place])   . Hyperlipidemia   . Hypertension   . Obesity   . Renal calculi   . Sleep apnea, obstructive   . Stroke [facility])    [id number]   Past Surgical History:  Past Surgical History:  Procedure Laterality Date  . LITHOTRIPSY    . NASAL POLYP EXCISION      Assessment & Plan Clinical Impression: Patient is a 61 y.o. 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 MRI with old infarct in left hemisphere affecting thalamus, basal gangli and deep white matter--unchanged. Patient with reports of intermittent uncontrolled movements of RLE followed by weakness..  Patient transferred to CIR on 09/08/2016.   Pt presents with decreased activity tolerance, decreased functional mobility, decreased balance Limiting pt's independence with leisure/community pursuits.   Plan Min 1 TR group during LOS >20 minutes  Recommendations for other services: Neuropsych  Discharge Criteria: Patient will be  discharged from TR if patient refuses treatment 3 consecutive times without medical reason.  If treatment goals not met, if there is a change in medical status, if patient makes no progress towards goals or if patient is discharged from hospital.  The above assessment, treatment plan, treatment alternatives and goals were discussed and mutually agreed upon: by patient  [place] 09/13/2016, 4:06 PM

## 2016-09-13 NOTE — Consult Note (Signed)
Neuropsychological/Psychological Consultation  Patient:   Eduardo Young   DOB:   24-Feb-1956  MR Number:  [id number]  Location:  [facility] [id number] [place] [address] Dept: [place]: [phone]           Date of Service:   09/13/2016  Start Time:   9:00 AM End Time:   10:56 AM  Provider/Observer:          Billing Code/Service: [phone]  Chief Complaint:    Stress and worry about what is going to happen to him once he goes home and worry about sister that he is dependent up being sick (Ovarian Cancer).  Reason for Service:  The patient was referred for psychological/neuropsychological consultation.  The patient had signficant stroke back in 2008 with long hospitalization and Rehab.  He was in rehab for about 1 year and then went home to live with sister and did further therapy for another 1.5 years.  He reports that it became increasing depressing seeing people die once he got the know them.  He felt everyone was dying and this overwhelmed him.  The patient reports that much of the cognitive difficulties going on right now were result of first stroke.  He reports that his speech impairments were part of first stroke.  Right arm paralysis was from first stroke.  He reports that Right Leg weakness is new.  He feels he is recovering from this new stroke and continues to make progress.  There is a big issue impacting his recovery.  He is very worried and anxious about his own mortality, and the health of his sister.  She is having chemo therapy and while his home is almost paid for he does not know what he will do if she dies.  He reports that this is constant intrusive thought.  Current Status:  The patient has intrusive worry and anxiety about his own medical status and physical limitations and fears about what will happen to him if his sister dies.  Reliability of Information: Review of  medical chart, 1 hour clinical interview with patient and communication with treating staff members.  Behavioral Observation: Eduardo Young  presents as a 61 y.o.-year-old Right Caucasian Male who appeared his stated age. his dress was Appropriate and he was Well Groomed and his manners were Appropriate to the situation.  his participation was indicative of Appropriate behaviors.  There were physical disabilities noted.  Right arm impairments and speech impairments (expressive language).  He was not able to walk.  he displayed an appropriate level of cooperation and motivation.     Interactions:    Active Appropriate  Attention:   attention span appeared shorter than expected for age  Memory:   abnormal; recent and remote memory intact  Visuo-spatial:  within normal limits  Speech (Volume):  normal  Speech:   fluent aphasia;  Thought Process:  Coherent  Though Content:  Rumination; Intrusive worries  Orientation:   person, place, time/date and situation  Judgment:   Fair  Planning:   Fair  Affect:    Anxious  Mood:    Anxious  Insight:   Present  Intelligence:   normal  Marital Status/Living: Patient had been living with sister for past few years.  She has significant medical issues (cancer).  Medical History:   Past Medical History:  Diagnosis Date  . Chronic venous insufficiency   . Diabetes mellitus   .  GERD (gastroesophageal reflux disease)   . Gout   . Hemiparesis ([place])   . Hyperlipidemia   . Hypertension   . Obesity   . Renal calculi   . Sleep apnea, obstructive   . Stroke [facility])    [id number]       @ENCMED @        Sexual History:   History  Sexual Activity  . Sexual activity: Not on file    Abuse/Trauma History: Patient has now had two significant CVAs  Psychiatric History:  Worry and anxiety  Family Med/Psych History:  Family History  Problem Relation Age of Onset  . Heart attack Mother   . Hypertension Mother   . Hyperlipidemia Mother    . Hypertension Father   . Hyperlipidemia Father   . CVA Father   . Heart attack Sister   . Lung cancer Brother   . Heart attack Brother   . Ovarian cancer Sister   . Heart attack Sister   . CVA Sister     Risk of Suicide/Violence: virtually non-existent Patient denies SI or HI  Impression/DX:  Patient has realistic fears about worries about his living situation once discharged and ongoing fears about his own mortality, fears about residual physical disabilities and what will happen if sister dies.  Disposition/Plan:  Will see again on Monday if patient is still here.          Electronically Signed   _______________________ Ilean Skill, Psy.D.

## 2016-09-14 ENCOUNTER — Inpatient Hospital Stay (HOSPITAL_COMMUNITY): Payer: Medicare Other | Admitting: Occupational Therapy

## 2016-09-14 ENCOUNTER — Inpatient Hospital Stay (HOSPITAL_COMMUNITY): Payer: Medicare Other | Admitting: Physical Therapy

## 2016-09-14 DIAGNOSIS — F4323 Adjustment disorder with mixed anxiety and depressed mood: Secondary | ICD-10-CM

## 2016-09-14 LAB — GLUCOSE, CAPILLARY
Glucose-Capillary: 127 mg/dL — ABNORMAL HIGH (ref 65–99)
Glucose-Capillary: 140 mg/dL — ABNORMAL HIGH (ref 65–99)
Glucose-Capillary: 107 mg/dL — ABNORMAL HIGH (ref 65–99)
Glucose-Capillary: 195 mg/dL — ABNORMAL HIGH (ref 65–99)

## 2016-09-14 LAB — CBC WITH DIFFERENTIAL/PLATELET
Basophils Absolute: 0 10*3/uL (ref 0.0–0.1)
Basophils Relative: 0 %
Eosinophils Absolute: 0.7 10*3/uL (ref 0.0–0.7)
Eosinophils Relative: 9 %
HCT: 36.2 % — ABNORMAL LOW (ref 39.0–52.0)
Hemoglobin: 11.3 g/dL — ABNORMAL LOW (ref 13.0–17.0)
Lymphocytes Relative: 25 %
Lymphs Abs: 2.1 10*3/uL (ref 0.7–4.0)
MCH: 27.6 pg (ref 26.0–34.0)
MCHC: 31.2 g/dL (ref 30.0–36.0)
MCV: 88.5 fL (ref 78.0–100.0)
Monocytes Absolute: 0.4 10*3/uL (ref 0.1–1.0)
Monocytes Relative: 5 %
Neutro Abs: 5.2 10*3/uL (ref 1.7–7.7)
Neutrophils Relative %: 61 %
Platelets: 186 10*3/uL (ref 150–400)
RBC: 4.09 MIL/uL — ABNORMAL LOW (ref 4.22–5.81)
RDW: 15.4 % (ref 11.5–15.5)
WBC: 8.6 10*3/uL (ref 4.0–10.5)

## 2016-09-14 LAB — BASIC METABOLIC PANEL
Anion gap: 8 (ref 5–15)
BUN: 36 mg/dL — ABNORMAL HIGH (ref 6–20)
CO2: 25 mmol/L (ref 22–32)
Calcium: 9 mg/dL (ref 8.9–10.3)
Chloride: 108 mmol/L (ref 101–111)
Creatinine, Ser: 1.8 mg/dL — ABNORMAL HIGH (ref 0.61–1.24)
GFR calc Af Amer: 46 mL/min — ABNORMAL LOW (ref >60)
GFR calc non Af Amer: 40 mL/min — ABNORMAL LOW (ref >60)
Glucose, Bld: 127 mg/dL — ABNORMAL HIGH (ref 65–99)
Potassium: 3.8 mmol/L (ref 3.5–5.1)
Sodium: 141 mmol/L (ref 135–145)

## 2016-09-14 NOTE — Progress Notes (Signed)
Physical Therapy Session Note  Patient Details  Name: Eduardo Young MRN: [id number] Date of Birth: 1956-04-08  Today's Date: 09/14/2016 PT Individual Time: 1021-1173 PT Individual Time Calculation (min): 76 min   Short Term Goals: Week 1:  PT Short Term Goal 1 (Week 1): STG =LTG due to ELOS   Skilled Therapeutic Interventions/Progress Updates:  Pt was sitting at EOB finishing breakfast upon arrival. Pt performed 2 bouts of standing balance (4 and 3 minutes) with hemiwalker to don briefs, pants, and shoes dependently. Pt performed sit->supine and then supine -> sit supervision with HOB elevated and use of bedrails to face towards door. Pt performed sit-to-stand and ambulatory transfers supervision throughout therapy with manual assistance to position RLE more posteriorly to LLE before transfer to increase weight shift to pt's RLE. Pt self-propelled in W/C using L side hemi method 150 ft supervision to reach rehab gym. Pt performed 6 bouts of standing balance averaging 3 minutes at a time with manual, verbal, and visual cues with mirror to promote anterior/lateral hip shift to facilitate upright posture, feet positioned shoulder width apart, and increase weight bearing and muscle activation on RLE to protect knee joint. Pt ambulated 5 ft supervision with hemiwalker and extra time. Pt performed 6 minutes on Nustep level 4 with RLE leg brace to maintain proper alignment with activity. Pt was returned to room and left sitting up in W/C with all needs within reach.      Therapy Documentation Precautions:  Precautions Precautions: Fall Precaution Comments: R hemiparesis Restrictions Weight Bearing Restrictions: No Pain: Pain Assessment Pain Assessment: No/denies pain Pain Score: 0-No pain  See Function Navigator for Current Functional Status.   Therapy/Group: Individual Therapy  [name] 09/14/2016, 12:18 PM

## 2016-09-14 NOTE — Plan of Care (Signed)
Problem: RH BOWEL ELIMINATION Goal: RH STG MANAGE BOWEL W/MEDICATION W/ASSISTANCE STG Manage Bowel with Medication with min Assistance.   Outcome: Not Progressing Pt will have a bowel movement w/medication assistance.

## 2016-09-14 NOTE — Progress Notes (Signed)
Social Work Eduardo Hashimoto, LCSW Social Worker Signed   Patient Care Conference Date of Service: 09/14/2016  9:54 AM      Hide copied text Hover for attribution information Inpatient RehabilitationTeam Conference and Plan of Care Update Date: 09/13/2016   Time: 2:25 PM      Patient Name: Eduardo Young      Medical Record Number: [id number]  Date of Birth: 07-Jun-1956 Sex: Male         Room/Bed: 4W10C/4W10C-01 Payor Info: Payor: MEDICARE / Plan: MEDICARE PART A AND B / Product Type: *No Product type* /     Admitting Diagnosis: CVA  Admit Date/Time:  09/08/2016  6:18 PM Admission Comments: No comment available    Primary Diagnosis:  Myoclonus Principal Problem: Myoclonus       Patient Active Problem List    Diagnosis Date Noted  . Adjustment disorder with mixed anxiety and depressed mood    . Benign essential HTN    . Type 2 diabetes mellitus with peripheral neuropathy (HCC)    . Stage 3 chronic kidney disease    . AKI (acute kidney injury) ([place])    . Hypokalemia    . Spastic hemiparesis of right dominant side ([place]) 09/08/2016  . Weakness    . Myoclonus    . Seizures ([place])    . TIA (transient ischemic attack) 09/07/2016  . Chronic venous insufficiency    . History of cerebral infarction ([place]) 10/21/2010  . DM (diabetes mellitus) ([place]) 10/21/2010  . HLD (hyperlipidemia) 10/21/2010  . HTN (hypertension) 10/21/2010  . Sleep apnea 10/21/2010      Expected Discharge Date: Expected Discharge Date: 09/17/16   Team Members Present: Physician leading conference: Dr. 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 Pt's last bm was 09/10/2016. Pt continent.  No constipation, increased adaptation to bladder control at night without use of condom cath when discharged.  Monitoring for healthy bowel and bladder patterns   Swallow/Nutrition/ Hydration               ADL's     Supervision/Min A  Mod I-Supervision  Functional mobility, R NMR, activity tolerance, pt/family education, dc plan   Mobility     supervision-min A  Mod I - supervision   functional mobility training, NMR R side, spasticity management, activity tolerance, pt/family education, discharge planning   Communication               Safety/Cognition/ Behavioral Observations             Pain     Pt denies pain, has not asked for any pain medication  Pt will be free from pain  Safety, comfort   Skin     Edema to right hand, some mottling of right hand skin, pulses good, r hand cool, abrasion to l elbow non-draining, Abrasion to hip. allevyn to protect.  Healed abrasions, no further breakdown, lessened edema to RUE.  Keep R hand elevated, protect from extreme temperatures, monitor skin, increased activity and movement.     *See Care Plan and  progress notes for long and short-term goals.   Barriers to Discharge: Mobility, cognition, safety, HTN, DM, CKD, hypokalemia, AKI     Possible Resolutions to Barriers:  Follow labs, therapies, optimize DM, HTN meds, encourage fluids     Discharge Planning/Teaching Needs:  Home with sister who works as a EMT, so at times will be home alone.      Team Discussion:  Goals supervision level-mod/i level. Wears condom cath at night due to transfers too difficult at night. Encourage fluids due to dehydrated-needed IV fluids at night. Neuro-pscyh seeing for coping. MD-monitoring BP and DM meds. Working on Tour manager. Sister was here Tuesday to observe in therapies.  Revisions to Treatment Plan:  DC 2/25    Continued Need for Acute Rehabilitation  Level of Care: The patient requires daily medical management by a physician with specialized training in physical medicine and rehabilitation for the following conditions: Daily direction of a multidisciplinary physical rehabilitation program to ensure safe treatment while eliciting the highest outcome that is of practical value to the patient.: Yes Daily medical management of patient stability for increased activity during participation in an intensive rehabilitation regime.: Yes Daily analysis of laboratory values and/or radiology reports with any subsequent need for medication adjustment of medical intervention for : Neurological problems;Diabetes problems;Blood pressure problems;Renal problems;Mood/behavior problems   Eduardo Young 09/14/2016, 9:54 AM       Patient ID: Eduardo Young, male   DOB: [birth year], 61 y.o.   MRN: [id number]

## 2016-09-14 NOTE — Patient Care Conference (Signed)
Inpatient RehabilitationTeam Conference and Plan of Care Update Date: 09/13/2016   Time: 2:25 PM    Patient Name: Eduardo Young      Medical Record Number: [id number]  Date of Birth: January 29, 1956 Sex: Male         Room/Bed: 4W10C/4W10C-01 Payor Info: Payor: MEDICARE / Plan: MEDICARE PART A AND B / Product Type: *No Product type* /    Admitting Diagnosis: CVA  Admit Date/Time:  09/08/2016  6:18 PM Admission Comments: No comment available   Primary Diagnosis:  Myoclonus Principal Problem: Myoclonus  Patient Active Problem List   Diagnosis Date Noted  . Adjustment disorder with mixed anxiety and depressed mood   . Benign essential HTN   . Type 2 diabetes mellitus with peripheral neuropathy (HCC)   . Stage 3 chronic kidney disease   . AKI (acute kidney injury) ([place])   . Hypokalemia   . Spastic hemiparesis of right dominant side ([place]) 09/08/2016  . Weakness   . Myoclonus   . Seizures ([place])   . TIA (transient ischemic attack) 09/07/2016  . Chronic venous insufficiency   . History of cerebral infarction ([place]) 10/21/2010  . DM (diabetes mellitus) ([place]) 10/21/2010  . HLD (hyperlipidemia) 10/21/2010  . HTN (hypertension) 10/21/2010  . Sleep apnea 10/21/2010    Expected Discharge Date: Expected Discharge Date: 09/17/16  Team Members Present: Physician leading conference: Dr. Delice Lesch Social Worker Present: Ovidio Kin, LCSW Nurse Present: Heather Roberts, RN PT Present: Carney Living, PT OT Present: Cherylynn Ridges, OT SLP Present: Other (comment) (happi Overton-SP)     Current Status/Progress Goal Weekly Team Focus  Medical   Spastic right hemiparesis secondary to CVA   Improve mobility, safety, transfers, chronic medical conditions  See above   Bowel/Bladder   Pt using urinal during day and condom cath at night. Pt's last bm was 09/10/2016. Pt continent.  No constipation, increased adaptation to bladder control at night without use of condom cath when discharged.  Monitoring  for healthy bowel and bladder patterns   Swallow/Nutrition/ Hydration             ADL's   Supervision/Min A  Mod I-Supervision  Functional mobility, R NMR, activity tolerance, pt/family education, dc plan   Mobility   supervision-min A  Mod I - supervision   functional mobility training, NMR R side, spasticity management, activity tolerance, pt/family education, discharge planning   Communication             Safety/Cognition/ Behavioral Observations            Pain   Pt denies pain, has not asked for any pain medication  Pt will be free from pain  Safety, comfort   Skin   Edema to right hand, some mottling of right hand skin, pulses good, r hand cool, abrasion to l elbow non-draining, Abrasion to hip. allevyn to protect.  Healed abrasions, no further breakdown, lessened edema to RUE.  Keep R hand elevated, protect from extreme temperatures, monitor skin, increased activity and movement.      *See Care Plan and progress notes for long and short-term goals.  Barriers to Discharge: Mobility, cognition, safety, HTN, DM, CKD, hypokalemia, AKI    Possible Resolutions to Barriers:  Follow labs, therapies, optimize DM, HTN meds, encourage fluids    Discharge Planning/Teaching Needs:  Home with sister who works as a EMT, so at times will be home alone.      Team Discussion:  Goals supervision level-mod/i level. Wears condom cath at  night due to transfers too difficult at night. Encourage fluids due to dehydrated-needed IV fluids at night. Neuro-pscyh seeing for coping. MD-monitoring BP and DM meds. Working on Tour manager. Sister was here Tuesday to observe in therapies.  Revisions to Treatment Plan:  DC 2/25   Continued Need for Acute Rehabilitation Level of Care: The patient requires daily medical management by a physician with specialized training in physical medicine and rehabilitation for the following conditions: Daily direction of a multidisciplinary physical  rehabilitation program to ensure safe treatment while eliciting the highest outcome that is of practical value to the patient.: Yes Daily medical management of patient stability for increased activity during participation in an intensive rehabilitation regime.: Yes Daily analysis of laboratory values and/or radiology reports with any subsequent need for medication adjustment of medical intervention for : Neurological problems;Diabetes problems;Blood pressure problems;Renal problems;Mood/behavior problems  Elease Hashimoto 09/14/2016, 9:54 AM

## 2016-09-14 NOTE — Progress Notes (Signed)
[facility] PHYSICAL MEDICINE & REHABILITATION     PROGRESS NOTE    Subjective/Complaints: Pt seen laying in bed.  He slept well overnight.  He states he needs to urinate and is able to ind sit at edge of bed from laying.   ROS: Denies nausea, vomiting, diarrhea, shortness of breath or chest pain  Objective: Vital Signs: Blood pressure 129/63, pulse (!) 51, temperature 97.9 F (36.6 C), temperature source Oral, resp. rate 18, height 6' 3"  (1.905 m), weight (!) 138.3 kg (305 lb), SpO2 98 %. No results found.  Recent Labs  09/14/16 0449  WBC 8.6  HGB 11.3*  HCT 36.2*  PLT 186    Recent Labs  09/14/16 0449  NA 141  K 3.8  CL 108  GLUCOSE 127*  BUN 36*  CREATININE 1.80*  CALCIUM 9.0   CBG (last 3)   Recent Labs  09/13/16 1647 09/13/16 2056 09/14/16 0636  GLUCAP 116* 159* 127*    Wt Readings from Last 3 Encounters:  09/08/16 (!) 138.3 kg (305 lb)  09/08/16 136 kg (299 lb 13.2 oz)  03/03/14 131.5 kg (290 lb)    Physical Exam:  Constitutional: He appears well-developed and well-nourished. NAD. HENT: Normocephalic and atraumatic.  Eyes: EOMI. No discharge.  Cardiovascular: RRR. No JVD. Respiratory: CTA B. Unlabored.  GI: Soft. Bowel sounds are normal.   Musculoskeletal: No tenderness.  BLE with stasis changes and healed lesions.  Min edema RLE > LLE and right hand.  Neurological: He is alert and oriented with some conufsion Right facial weakness  Able to follow commands without difficulty.  LUE/LLE: 5/5 RUE: 0/5 (stable) RLE: HF 3/5 KE 3/5 2/5 ADF/PF MAS: RUE: 1/4, ankle clonus Dysarthria Skin: Skin is warm and dry. Intact. Psychiatric: Tangential, verbose.  Assessment/Plan: 1. 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 Physiatrist is providing close team supervision and 24 hour management of active  medical problems listed below. Physiatrist and rehab team continue to assess barriers to discharge/monitor patient progress toward functional and medical goals.  Function:  Bathing Bathing position   Position: Wheelchair/chair at sink  Bathing parts Body parts bathed by patient: Chest, Abdomen, Front perineal area, Left lower leg, Right lower leg, Left upper leg, Right upper leg, Right arm, Left arm Body parts bathed by helper: Buttocks, Back  Bathing assist Assist Level: Touching or steadying assistance(Pt > 75%)      Upper Body Dressing/Undressing Upper body dressing   What is the patient wearing?: Pull over shirt/dress     Pull over shirt/dress - Perfomed by patient: Thread/unthread left sleeve, Thread/unthread right sleeve, Put head through opening, Pull shirt over trunk Pull over shirt/dress - Perfomed by helper: Pull shirt over trunk        Upper body assist Assist Level: Supervision or verbal cues, Set up   Set up : To obtain clothing/put away  Lower Body Dressing/Undressing Lower body dressing   What is the patient wearing?: Shoes, Pants     Pants- Performed by patient: Thread/unthread left pants leg Pants- Performed by helper: Pull pants up/down, Thread/unthread right pants leg Non-skid slipper socks- Performed by patient: Don/doff right sock, Don/doff left sock Non-skid slipper socks- Performed by helper: Don/doff right sock, Don/doff left sock     Shoes - Performed by patient: Don/doff right shoe, Don/doff left shoe            Lower  body assist Assist for lower body dressing: Touching or steadying assistance (Pt > 75%)      Toileting Toileting   Toileting steps completed by patient: Adjust clothing prior to toileting (Pt prefers to sleep without underwear, easier to manage urinal .) Toileting steps completed by helper: Adjust clothing prior to toileting, Performs perineal hygiene, Adjust clothing after toileting Toileting Assistive Devices: Grab bar or  rail  Toileting assist Assist level: More than reasonable time, Set up/obtain supplies, Supervision or verbal cues   Transfers Chair/bed transfer   Chair/bed transfer method: Ambulatory Chair/bed transfer assist level: Supervision or verbal cues Chair/bed transfer assistive device: Environmental consultant, Chiropractor)     Locomotion Ambulation     Max distance: 6 ft Assist level: Supervision or verbal cues   Wheelchair   Type: Manual Max wheelchair distance: 157 ft Assist Level: Supervision or verbal cues  Cognition Comprehension Comprehension assist level: Follows basic conversation/direction with extra time/assistive device  Expression Expression assist level: Expresses basic 75 - 89% of the time/requires cueing 10 - 24% of the time. Needs helper to occlude trach/needs to repeat words.  Social Interaction Social Interaction assist level: Interacts appropriately with others with medication or extra time (anti-anxiety, antidepressant).  Problem Solving Problem solving assist level: Solves basic 75 - 89% of the time/requires cueing 10 - 24% of the time  Memory Memory assist level: Recognizes or recalls 75 - 89% of the time/requires cueing 10 - 24% of the time   Medical Problem List and Plan:  1. 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 DVT Prophylaxis/Anticoagulation: Pharmaceutical: Lovenox  3. Pain Management: N/A  4. Mood: LCSW to follow for evaluation and support.  5. Neuropsych: This patient is not fully capable of making decisions on his own behalf.  6. Skin/Wound Care: routine pressure relief measures. TEDs for LE edema control.  7. Fluids/Electrolytes/Nutrition: encourage PO 8. HTN:   On catapres, labetalol, hydralazine, HCTZ, cardura and lisinopril   -increased catapres to BID on 2/17  Slightly labile, but overall controlled 2/22 9.T2DM with neuropathy and nephropathy: Monitor BS ac/hs. Continue lantus 20 units bid--metformin held due to CKD.   Relatively  controlled 2/22 10. CKD: Monitor renal status serially. Baseline SCr- 1.7  With AKI, Cr 1.80 on 2/22  Cont to monitor  Encourage fluids 11. Hypokalemia:   Cont supplementation  postassium 3.8 on 2/22 12. Chronic constipation:  senna at nights 13. Spastic hemiparesis with myoclonus:   Continue klonopin 0.54m bid   Will consider baclofen trial if necessary  LOS (Days) 6 A FACE TO FACE EVALUATION WAS PERFORMED  Cortlynn Hollinsworth ALorie Phenix MD 09/14/2016 8:52 AM

## 2016-09-14 NOTE — Progress Notes (Signed)
Occupational Therapy Session Note  Patient Details  Name: Eduardo Young MRN: [id number] Date of Birth: 25-Dec-1955  Today's Date: 09/14/2016  Session 1 OT Individual Time: 1101-1200 OT Individual Time Calculation (min): 59 min  Session 2 OT Individual Time: 1300-1400 OT Individual Time Calculation (min): 60 min    Short Term Goals: Week 1:  OT Short Term Goal 1 (Week 1): STGs equal to LTGs set at supervision level.  Skilled Therapeutic Interventions/Progress Updates:  Session 1   OT treatment session focused on dc planning/education, functional transfers, iADLs at w/c level, and R shoulder subluxation. Pt showed OT drawing of home bathroom set-up and discussed PLOF with bathroom access and self-care. Pt/therapist collaboration on home modifications to increase independence with ADL. Pt's wide drop arm BSC delivered to room and collaborated on safe transfers to increase continent episodes at home. Attempted lateral scoot transfer to [facility], pt required Min/Mod A for transfer 2/2 positioning and motor planning. Stand-pivot back to wc with hemi-walker overall close supervision/min A and increased time. Pt then accessed laundry room and loaded washing machine with Min A to turn on top loader machine from wc. Pt then brought to therapy gym and OT applied kinisiotape for R shoulder subluxation support. Educated pt on skin monitoring, then returned pt to room. Pt left seated in wc with call bell and needs met.   Session 2  OT session focused on sit<>stand, activity tolerance, and dynamic standing balance. Pt wheeled wc to laundry room and worked on dynamic standing balance with focus on weight shifting to R within context of laundry iADL. Facilitated balance strategies while reaching into top loader washer and transporting into front loader dryer.  Overall CGA with intermittent Min A to correct close LOB when leaning to R. Pt required extended rest break s/p iADl Dynamic standing activity in therapy gym-  facilitated weight shift to R to strength R LE and encourage trunk extension. Overall close supervision and increased time sit<>stand. Provided pt with elbow pad for added protection to R Ue. Pt returned to room at end of session and left with needs met.   Therapy Documentation Precautions:  Precautions Precautions: Fall Precaution Comments: R hemiparesis Restrictions Weight Bearing Restrictions: No Pain:  denies pain  See Function Navigator for Current Functional Status.   Therapy/Group: Individual Therapy  Valma Cava 09/14/2016, 3:49 PM

## 2016-09-15 ENCOUNTER — Inpatient Hospital Stay (HOSPITAL_COMMUNITY): Payer: Medicare Other | Admitting: Physical Therapy

## 2016-09-15 ENCOUNTER — Inpatient Hospital Stay (HOSPITAL_COMMUNITY): Payer: Medicare Other | Admitting: Occupational Therapy

## 2016-09-15 LAB — BASIC METABOLIC PANEL
Anion gap: 7 (ref 5–15)
BUN: 35 mg/dL — ABNORMAL HIGH (ref 6–20)
CO2: 26 mmol/L (ref 22–32)
Calcium: 8.8 mg/dL — ABNORMAL LOW (ref 8.9–10.3)
Chloride: 107 mmol/L (ref 101–111)
Creatinine, Ser: 1.75 mg/dL — ABNORMAL HIGH (ref 0.61–1.24)
GFR calc Af Amer: 48 mL/min — ABNORMAL LOW (ref >60)
GFR calc non Af Amer: 41 mL/min — ABNORMAL LOW (ref >60)
Glucose, Bld: 128 mg/dL — ABNORMAL HIGH (ref 65–99)
Potassium: 3.8 mmol/L (ref 3.5–5.1)
Sodium: 140 mmol/L (ref 135–145)

## 2016-09-15 LAB — CBC
HCT: 35.5 % — ABNORMAL LOW (ref 39.0–52.0)
Hemoglobin: 11 g/dL — ABNORMAL LOW (ref 13.0–17.0)
MCH: 27.6 pg (ref 26.0–34.0)
MCHC: 31 g/dL (ref 30.0–36.0)
MCV: 89 fL (ref 78.0–100.0)
Platelets: 193 10*3/uL (ref 150–400)
RBC: 3.99 MIL/uL — ABNORMAL LOW (ref 4.22–5.81)
RDW: 15.2 % (ref 11.5–15.5)
WBC: 6.6 10*3/uL (ref 4.0–10.5)

## 2016-09-15 LAB — GLUCOSE, CAPILLARY
Glucose-Capillary: 122 mg/dL — ABNORMAL HIGH (ref 65–99)
Glucose-Capillary: 122 mg/dL — ABNORMAL HIGH (ref 65–99)
Glucose-Capillary: 113 mg/dL — ABNORMAL HIGH (ref 65–99)
Glucose-Capillary: 207 mg/dL — ABNORMAL HIGH (ref 65–99)

## 2016-09-15 MED ORDER — HYDRALAZINE HCL 25 MG PO TABS: 50.0000 mg | ORAL_TABLET | Freq: Three times a day (TID) | ORAL | 11 refills | Status: DC

## 2016-09-15 MED ORDER — CLONAZEPAM 0.5 MG PO TABS: 0.5000 mg | ORAL_TABLET | Freq: Two times a day (BID) | ORAL | 0 refills | Status: DC

## 2016-09-15 MED ORDER — SENNOSIDES-DOCUSATE SODIUM 8.6-50 MG PO TABS: 2.0000 | ORAL_TABLET | Freq: Every day | ORAL | 0 refills | Status: DC

## 2016-09-15 MED ORDER — SIMVASTATIN 40 MG PO TABS: 40.0000 mg | ORAL_TABLET | Freq: Every day | ORAL | 0 refills | Status: DC

## 2016-09-15 MED ORDER — POTASSIUM CHLORIDE CRYS ER 20 MEQ PO TBCR: 20.0000 meq | EXTENDED_RELEASE_TABLET | Freq: Three times a day (TID) | ORAL | 0 refills | Status: DC

## 2016-09-15 MED ORDER — CLONIDINE HCL 0.1 MG PO TABS: 0.1000 mg | ORAL_TABLET | Freq: Two times a day (BID) | ORAL | 0 refills | Status: DC

## 2016-09-15 NOTE — Discharge Instructions (Signed)
Inpatient Rehab Discharge Instructions  Eduardo Young Discharge date and time:  09/17/16  Activities/Precautions/ Functional Status: Activity: no lifting, driving, or strenuous exercise till cleared by MD.  Diet: cardiac diet and diabetic diet Need to drink at least 5 glasses of water daily  Wound Care: N/A  Functional status:  ___ No restrictions     ___ Walk up steps independently _X__ 24/7 supervision/assistance   ___ Walk up steps with assistance _X__ Intermittent supervision/assistance  ___ Bathe/dress independently ___ Walk with walker     _X__ Bathe/dress with assistance ___ Walk Independently    ___ Shower independently _X__ Walk with supervision.     ___ Shower with assistance _X__ No alcohol     ___ Return to work/school ________   Special Instructions: 1. Note decrease in insulin dose. Do not use metformin. 2. Use medications as listed at discharge.  3. Monitor blood sugars 2-3 times a day. Contact Dr. Dennard Schaumann if blood sugars start running over 150.      COMMUNITY REFERRALS UPON DISCHARGE:      Outpatient: PT & OT  Agency:[facility] FHLKT:[phone]   Date of Last Service:09/17/2016  Appointment Date/Time:MARCH 1 Thursday 9:45-11:45 AM BOTH THERAPIES  Medical Equipment/Items Ordered:WIDE Elijah Birk COMMODE  Agency/Supplier:[facility]    [phone] Other:  GENERAL COMMUNITY RESOURCES FOR PATIENT/FAMILY: Support Groups:[facility] SUPPORT GROUP EVERY SECOND Thursday @ 3:00-4:00 PM ON THE REHAB UNIT QUESTIONS CONTACT CAITLYN  [phone]  My questions have been answered and I understand these instructions. I will adhere to these goals and the provided educational materials after my discharge from the hospital.  Patient/Caregiver Signature _______________________________ Date __________  Clinician Signature _______________________________________ Date __________  Please bring this form and your medication list with you to all your  follow-up doctor's appointments.

## 2016-09-15 NOTE — Procedures (Signed)
Pt declines the use of CPAP.

## 2016-09-15 NOTE — Progress Notes (Signed)
Occupational Therapy Session Note  Patient Details  Name: Eduardo Young MRN: [id number] Date of Birth: [birth year]  Today's Date: 09/15/2016 OT Individual Time: 1000-1100 OT Individual Time Calculation (min): 60 min    Short Term Goals: Week 1:  OT Short Term Goal 1 (Week 1): STGs equal to LTGs set at supervision level.  Skilled Therapeutic Interventions/Progress Updates:    1:1 OT session focused on tub/shower transfers, modified strategies for increased independence with bathing/dressing, and increased activity tolerance. Pt propelled wc to tub/shower room with increased tim. Discussed home bathroom set-up and simulated tub shower transfer to home environment. Pt took 3 small steps into bathroom and turned to sit onto tub bench using hemi-walker and close supervision. Verbal cues for proximity to tub bench. Pt doffed pants at edge of tub bench, then able to swing B LE's into tub/shower with supervision. Educated on leaning method for washing buttocks, pt required min A for thoroughness. Pt completed modified LB and UB dressing sitting and edge of tub/bench with Min A to thread pants through L pant leg. Pt completed stand-step turn transfer back to wc and returned to room with needs met.   Therapy Documentation Precautions:  Precautions Precautions: Fall Precaution Comments: R hemiparesis Restrictions Weight Bearing Restrictions: No  Pain: Pain Assessment Pain Assessment: No/denies pain Pain Score: 0-No pain  See Function Navigator for Current Functional Status.   Therapy/Group: Individual Therapy  Valma Cava 09/15/2016, 10:44 AM

## 2016-09-15 NOTE — Progress Notes (Signed)
Physical Therapy Session Note  Patient Details  Name: Eduardo Young MRN: [id number] Date of Birth: 1956-01-22  Today's Date: 09/15/2016 PT Individual Time: [phone] and 1430-1550 PT Individual Time Calculation (min): 58 min and 80 min  Short Term Goals: Week 1:  PT Short Term Goal 1 (Week 1): STG =LTG due to ELOS   Skilled Therapeutic Interventions/Progress Updates:  Session 1: Pt was supine in bed upon arrival. Nursing assisted with removing condom catheter before therapy began. Pt demonstrated supervision with bed rolling L and R to donn briefs dependently by PT to manage time. Pt required steady assistance to sit up on EOB. PT threaded pants and assisted with donning shoes, while pt was capable of pulling up pants after standing from EOB independently. Sit-to-stand transfers throughout therapy were supervision with verbal cues for R foot placement to facilitate weight acceptance over RLE. Pt ambulated 7 ft and 2 ft steady assistance using hallway rail and manual/verbal cues to reduce R knee hyperextension to increase muscle activation and increase step length bilaterally to improve gait mechanics and safety. PT performed two stand pivot transfers between mat and W/C CGA with manual cues for R knee flexion for muscle activation around the knee and increase weight shift. Pt tolerated 1 min of standing using CGA using Hemiwalker with manual cues for R knee flexion before needing to sit down due to fatigue and uneasiness with tasks. Pt self-propelled in W/C between room and gym (175 ft)  using L sided hemi-method Mod I. Pt was left sitting up in W/C with all needs within reach.   Session 2: Pt was sitting up in W/C upon arrival with sister present in room. Pt self-propelled in W/C 165 ft to rehab gym Mod I using L side hemi-method. Pt performed sit-to-stand and ambulatory transfers with supervision level using hemiwalker throughout therapy. Pt performed 5 bouts of standing balance from EOM with a 4  inch step stool under LLE to facilitate increased weight shift over RLE and manual/verbal/visual cues to maintain upright posture and minimize hyperextension of R knee. Pt performed each trial with an average time of 20 seconds with steady assistance required to maintain upright position. Pt participated in stair training on the 3 inch steps. Pt was max A using L side rail and max manual and verbal cues for foot placement, sequencing of steps, posture, and encouragement. Pt performed 10 min on the Nustep at level 4 with RLE attachment. Pt was returned to room and left sitting up in W/C with all needs within reach. Pt and pt's sister were shown how to manage the bed side commode for preparation for discharge.      Therapy Documentation Precautions:  Precautions Precautions: Fall Precaution Comments: R hemiparesis Restrictions Weight Bearing Restrictions: No Pain: Pain Assessment Pain Assessment: No/denies pain Pain Score: 0-No pain   See Function Navigator for Current Functional Status.   Therapy/Group: Individual Therapy  [name] 09/15/2016, 10:27 AM

## 2016-09-15 NOTE — Progress Notes (Signed)
Pt. Refused cpap. RT informed pt. To notify if he changes his mind. 

## 2016-09-15 NOTE — Progress Notes (Signed)
[facility]ILITATION     PROGRESS NOTE    Subjective/Complaints: Pt seen sitting up in up this am, eating breakfast. He slept well overnight. He has a tatoo "candlebox" on his forearm, when asked what it is, he explains what a box of candles are and then tells me it is also a group.    ROS: Denies nausea, vomiting, diarrhea, shortness of breath or chest pain  Objective: Vital Signs: Blood pressure (!) 142/58, pulse 68, temperature 98.1 F (36.7 C), temperature source Oral, resp. rate 16, height 6' 3"  (1.905 m), weight (!) 138.3 kg (305 lb), SpO2 97 %. No results found.  Recent Labs  09/14/16 0449 09/15/16 0504  WBC 8.6 6.6  HGB 11.3* 11.0*  HCT 36.2* 35.5*  PLT 186 193    Recent Labs  09/14/16 0449 09/15/16 0504  NA 141 140  K 3.8 3.8  CL 108 107  GLUCOSE 127* 128*  BUN 36* 35*  CREATININE 1.80* 1.75*  CALCIUM 9.0 8.8*   CBG (last 3)   Recent Labs  09/14/16 1632 09/14/16 2101 09/15/16 0633  GLUCAP 107* 195* 122*    Wt Readings from Last 3 Encounters:  09/08/16 (!) 138.3 kg (305 lb)  09/08/16 136 kg (299 lb 13.2 oz)  03/03/14 131.5 kg (290 lb)    Physical Exam:  Constitutional: He appears well-developed and well-nourished. NAD. HENT: Normocephalic and atraumatic.  Eyes: EOMI. No discharge.  Cardiovascular: RRR. No JVD. Respiratory: CTA B. Unlabored.  GI: Soft. Bowel sounds are normal.   Musculoskeletal: No tenderness.  BLE with stasis changes and healed lesions.  Min edema RLE > LLE and right hand.  Neurological: He is alert and oriented with some conufsion Right facial weakness  Able to follow commands without difficulty.  LUE/LLE: 5/5 RUE: 0/5 (unchanged) RLE: HF 3/5 KE 3/5 2/5 ADF/PF MAS: RUE: 1/4, ankle clonus Dysarthria Skin: Skin is warm and dry. Intact. Psychiatric: Tangential, verbose.  Assessment/Plan: 1. 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 Physiatrist is providing close team supervision and 24 hour management of active medical problems listed below. Physiatrist and rehab team continue to assess barriers to discharge/monitor patient progress toward functional and medical goals.  Function:  Bathing Bathing position   Position: Wheelchair/chair at sink  Bathing parts Body parts bathed by patient: Chest, Abdomen, Front perineal area, Left lower leg, Right lower leg, Left upper leg, Right upper leg, Right arm, Left arm Body parts bathed by helper: Buttocks, Back  Bathing assist Assist Level: Touching or steadying assistance(Pt > 75%)      Upper Body Dressing/Undressing Upper body dressing   What is the patient wearing?: Pull over shirt/dress     Pull over shirt/dress - Perfomed by patient: Thread/unthread left sleeve, Thread/unthread right sleeve, Put head through opening, Pull shirt over trunk Pull over shirt/dress - Perfomed by helper: Pull shirt over trunk        Upper body assist Assist Level: Supervision or verbal cues, Set up   Set up : To obtain clothing/put away  Lower Body Dressing/Undressing Lower body dressing   What is the patient wearing?: Shoes, Pants     Pants- Performed by patient: Thread/unthread left pants leg Pants- Performed by helper: Pull pants up/down, Thread/unthread right pants leg, Thread/unthread left pants leg Non-skid slipper socks- Performed by patient: Don/doff right sock, Don/doff left sock Non-skid slipper socks- Performed  by helper: Don/doff right sock, Don/doff left sock     Shoes - Performed by patient: Don/doff right shoe, Don/doff left shoe Shoes - Performed by helper: Don/doff right shoe, Don/doff left shoe          Lower body assist Assist for lower body dressing: 2 Helpers      Toileting Toileting   Toileting steps completed by patient: Adjust clothing prior to toileting (Pt  prefers to sleep without underwear, easier to manage urinal .) Toileting steps completed by helper: Adjust clothing prior to toileting, Performs perineal hygiene, Adjust clothing after toileting Toileting Assistive Devices: Grab bar or rail  Toileting assist Assist level: Touching or steadying assistance (Pt.75%), More than reasonable time   Transfers Chair/bed transfer   Chair/bed transfer method: Ambulatory Chair/bed transfer assist level: Supervision or verbal cues Chair/bed transfer assistive device: Walker, Air cabin crew     Max distance: 5 ft Assist level: Supervision or verbal cues   Wheelchair   Type: Manual Max wheelchair distance: 150 ft Assist Level: Supervision or verbal cues  Cognition Comprehension Comprehension assist level: Follows basic conversation/direction with no assist  Expression Expression assist level: Expresses basic 75 - 89% of the time/requires cueing 10 - 24% of the time. Needs helper to occlude trach/needs to repeat words.  Social Interaction Social Interaction assist level: Interacts appropriately with others with medication or extra time (anti-anxiety, antidepressant).  Problem Solving Problem solving assist level: Solves basic 75 - 89% of the time/requires cueing 10 - 24% of the time  Memory Memory assist level: Recognizes or recalls 75 - 89% of the time/requires cueing 10 - 24% of the time   Medical Problem List and Plan:  1. 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  Will see patient for transitional care management in 1-2 weeks.  2. DVT Prophylaxis/Anticoagulation: Pharmaceutical: Lovenox  3. Pain Management: N/A  4. Mood: LCSW to follow for evaluation and support.  5. Neuropsych: This patient is not fully capable of making decisions on his own behalf.  6. Skin/Wound Care: routine pressure relief measures. TEDs for LE edema control.  7. Fluids/Electrolytes/Nutrition: encourage PO 8.  HTN:   On catapres, labetalol, hydralazine, HCTZ, cardura and lisinopril   increased catapres to BID on 2/17  Slightly labile, but overall controlled 2/23 9.T2DM with neuropathy and nephropathy: Monitor BS ac/hs. Continue lantus 20 units bid--metformin held due to CKD.   Relatively controlled 2/23 10. CKD: Monitor renal status serially. Baseline SCr- 1.7  With AKI, Cr 1.75 on 2/23 (near baseline)  Cont to monitor  Encourage fluids  Will need to monitor renal function as outpt and adjust lisinopril if needed 11. Hypokalemia:   Cont supplementation  postassium 3.8 on 2/23 12. Chronic constipation:  senna at nights 13. Spastic hemiparesis with myoclonus:   Continue klonopin 0.70m bid   Will consider baclofen trial if necessary 14. ABLA  Hb 11.0 on 2/23  LOS (Days) 7 A FACE TO FACE EVALUATION WAS PERFORMED  Oron Westrup ALorie Phenix MD 09/15/2016 8:51 AM

## 2016-09-15 NOTE — Discharge Summary (Addendum)
Physician Discharge Summary  Patient ID: Eduardo Young MRN: [id number] DOB/AGE: [birth year] 61 y.o.  Admit date: 09/08/2016 Discharge date: 09/16/2016  Discharge Diagnoses:  Principal Problem:   Spastic hemiparesis of right dominant side ([place]) Active Problems:   HLD (hyperlipidemia)   Sleep apnea   Weakness   Myoclonus   Seizures (HCC)   Benign essential HTN   Type 2 diabetes mellitus with peripheral neuropathy (HCC)   Stage 3 chronic kidney disease   Adjustment disorder with mixed anxiety and depressed mood   Discharged Condition: stable   Significant Diagnostic Studies: Dg Chest 2 View  Result Date: 09/08/2016 CLINICAL DATA:  Left-sided weakness EXAM: CHEST  2 VIEW COMPARISON:  03/24/2013 FINDINGS: Cardiac shadow is stable. The lungs are well aerated bilaterally. No focal infiltrate or sizable effusion is seen. No bony abnormality is noted. IMPRESSION: No acute abnormality seen. Electronically Signed   By: Inez Catalina M.D.   On: 09/08/2016 07:44   Ct Head Wo Contrast  Result Date: 09/07/2016 CLINICAL DATA:  Right leg weakness.  History of stroke. EXAM: CT HEAD WITHOUT CONTRAST TECHNIQUE: Contiguous axial images were obtained from the base of the skull through the vertex without intravenous contrast. 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 Slight ex vacuo dilatation of body of the left lateral ventricle. Chronic small vessel ischemic disease of periventricular white matter. No acute intracranial hemorrhage, midline shift or edema. No extra-axial fluid. Vascular: No hyperdense vessels or unexpected calcifications. Skull: Intact. Sinuses/Orbits: Clear mastoids. Moderate ethmoid and frontal sinus mucosal thickening. Mild bilateral maxillary and ethmoid sinus mucosal thickening. Other: None IMPRESSION: 1. Chronic small vessel ischemic disease of periventricular white matter. 2. 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 3. No acute intracranial abnormality. 4. Chronic paranasal sinusitis. Electronically Signed   By: Ashley Royalty M.D.   On: 09/07/2016 21:11   Mr Brain Wo Contrast  Result Date: 09/08/2016 CLINICAL DATA:  Acute onset of weakness last night. EXAM: MRI HEAD WITHOUT CONTRAST TECHNIQUE: Multiplanar, multiecho pulse sequences of the brain and surrounding structures were obtained without intravenous contrast. COMPARISON:  Head CT 09/07/2016 FINDINGS: Brain: Diffusion imaging does not show any acute or subacute infarction. Old Wallerian degeneration of the brainstem on the left. The cerebellum is normal. Cerebral hemispheres show moderate chronic small-vessel ischemic changes of the deep white matter with an old lacunar infarction in the right basal ganglia and an extensive old infarction in the left thalamus, posterior basal ganglia and radiating white matter tracts with atrophy and gliosis. Hemosiderin deposition is present throughout that region. No evidence of neoplastic mass lesion, acute hemorrhage, hydrocephalus or extra-axial collection. There is ex vacuo enlargement of the left lateral ventricle. No pituitary mass. Vascular: Major vessels at the base of the brain show flow. Skull and upper cervical spine: Negative Sinuses and orbits: Mucosal inflammatory changes of the paranasal sinuses. Orbits negative. Other: None significant IMPRESSION: Old infarction in the left hemisphere affecting the thalamus, basal ganglia and deep white matter. Unchanged from previous studies. No acute finding. Electronically Signed   By: Nelson Chimes M.D.   On: 09/08/2016 06:40    Labs:  Basic Metabolic Panel:  Recent Labs Lab 09/14/16 0449 09/15/16 0504  NA 141 140  K 3.8 3.8  CL 108 107  CO2 25 26  GLUCOSE 127* 128*  BUN 36* 35*  CREATININE 1.80* 1.75*  CALCIUM 9.0 8.8*    CBC:  Recent Labs Lab 09/14/16 0449  09/15/16 0504  WBC 8.6 6.6  NEUTROABS 5.2  --   HGB 11.3*  11.0*  HCT 36.2* 35.5*  MCV 88.5 89.0  PLT 186 193    CBG:  Recent Labs Lab 09/16/16 0645 09/16/16 1207 09/16/16 1623 09/16/16 2022 09/17/16 0648  GLUCAP 116* 127* 129* 174* 132*    Brief HPI:   Eduardo Young is a 61 y.o. 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 MRI with old infarct in left hemisphere affecting thalamus, basal gangli and deep white matter--unchanged. Patient with reports of intermittent uncontrolled movements of RLE followed by weakness. EEG without focal seizure activaity. Therapy evaluations done and patient noted to have limitations in mobility and self care tasks. CIR recommended for follow up therapy   Hospital Course: Eduardo Young was admitted to rehab 09/08/2016 for inpatient therapies to consist of PT and OT at least three hours five days a week. Past admission physiatrist, therapy team and rehab RN have worked together to provide customized collaborative inpatient rehab.  Klonopin was added to help manage spasticity and myoclonus. He has refused to use CPAP during his stay. Blood pressures were noted to be poorly controlled and catapres was increased to bid with better control. Acute on chronic renal failure has improved with hydration and k dur was added to supplement hypokalemia. His diabetes was monitored with ac/hs cbg checks and metformin was discontinued due to evidence of CKD with Scr 1.7. Po intake has been good and BS have been controlled on lantus 20 units bid. He has made good progress and is modified independent at wheelchair level.  He will continue to receive outpatient PT and OT at [facility] starting 09/21/16.    Rehab course: During patient's stay in rehab team conference was held to monitor patient's progress, set goals and discuss barriers to discharge. At admission, patient required min assist for basic  self care tasks and mobility. He has had improvement in activity tolerance, balance, postural control, as well as ability to compensate for deficits.  He requires min assist to complete ADL tasks.  He is modified independent for transfers and is ambulating 165' at with supervision and use of hemi walker on the left. He requires max assist to climb stairs.  Family education was completed with sister who will assist as needed after discharge.    Disposition: Home  Diet: Heart Healthy.   Special Instructions: 1. Needs supervision for safety.  2. Needs BMET rechecked in a week 1-2 week to monitor renal status and potassium levels.    Allergies as of 09/17/2016   No Known Allergies     Medication List    STOP taking these medications   DULCOLAX 5 MG EC tablet Generic drug:  bisacodyl     TAKE these medications   allopurinol 100 MG tablet Commonly known as:  ZYLOPRIM TAKE ONE TABLET BY MOUTH TWICE DAILY   aspirin 81 MG tablet Take 81 mg by mouth every evening.   clonazePAM 0.5 MG tablet Commonly known as:  KLONOPIN Take 1 tablet (0.5 mg total) by mouth 2 (two) times daily.   cloNIDine 0.1 MG tablet Commonly known as:  CATAPRES Take 1 tablet (0.1 mg total) by mouth 2 (two) times daily. What changed:  when to take this   doxazosin 4 MG tablet Commonly known as:  CARDURA TAKE ONE TABLET BY MOUTH ONCE DAILY  hydrALAZINE 25 MG tablet Commonly known as:  APRESOLINE Take 2 tablets (50 mg total) by mouth every 8 (eight) hours. What changed:  when to take this   hydrochlorothiazide 25 MG tablet Commonly known as:  HYDRODIURIL TAKE ONE TABLET BY MOUTH ONCE DAILY   insulin glargine 100 UNIT/ML injection Commonly known as:  LANTUS Inject 20 Units into the skin 2 (two) times daily. Notes to patient:  With breakfast and at bedtime   labetalol 300 MG tablet Commonly known as:  NORMODYNE TAKE ONE TABLET BY MOUTH TWICE DAILY   lisinopril 20 MG tablet Commonly known as:   PRINIVIL,ZESTRIL TAKE ONE TABLET BY MOUTH TWICE DAILY   multivitamin with minerals Tabs tablet Take 1 tablet by mouth daily.   potassium chloride SA 20 MEQ tablet Commonly known as:  K-DUR,KLOR-CON Take 1 tablet (20 mEq total) by mouth 3 (three) times daily.   senna-docusate 8.6-50 MG tablet Commonly known as:  Senokot-S Take 2 tablets by mouth at bedtime.   simvastatin 40 MG tablet Commonly known as:  ZOCOR Take 1 tablet (40 mg total) by mouth at bedtime. What changed:  See the new instructions.      Follow-up Information    [facility] TOM, MD Follow up on 09/28/2016.   Specialty:  Family Medicine Why:  Appointment @ 10:30 AM Contact information: [address] [place] [address] [phone]        Jamse Arn, MD Follow up.   Specialty:  Physical Medicine and Rehabilitation Why:  office will call you with follow up appointment Contact information: [address] [place] [address] [phone]           Signed: Bary Leriche 09/18/2016, 5:16 PM

## 2016-09-15 NOTE — Plan of Care (Signed)
Problem: RH Stairs Goal: LTG Patient will ambulate up and down stairs w/assist (PT) LTG: Patient will ambulate up and down # of stairs with assistance (PT)  Outcome: Not Applicable Date Met: 75/91/63 Patient's home has level entry.

## 2016-09-15 NOTE — Progress Notes (Signed)
Social Work  Discharge Note  The overall goal for the admission was met for:   Discharge location: [place] 2-3 DAYS PER WEEK  Length of Stay: Yes-9 DAYS  Discharge activity level: Yes-SUPERVISION-MOD/I WHEELCHAIR LEVEL  Home/community participation: Yes  Services provided included: MD, RD, PT, OT, SLP, RN, CM, TR, Pharmacy, Neuropsych and SW  Financial Services: Medicare  Follow-up services arranged: Outpatient: [facility]-PT & OT 3/1 9:45-11;45, DME: [facility] BEDSIDE and Patient/Family has no preference for HH/DME agencies  Comments (or additional information):PT DID WELL AND SISTER HAS BEEN HERE TO SEE HIM IN THERAPIES. PT WILL BE ALONE WHEN SISTER WORKING 2-3 DAYS PER WEEK. NEEDS TO GET OUT MORE AND NOT BE SO ISOLATED IN HIS HOME. GAVE RESOURCES TO PURSUE.  Patient/Family verbalized understanding of follow-up arrangements: Yes  Individual responsible for coordination of the follow-up plan: SELF & SUSAN-SISTER  Confirmed correct DME delivered: Elease Hashimoto 09/15/2016    Elease Hashimoto

## 2016-09-16 ENCOUNTER — Inpatient Hospital Stay (HOSPITAL_COMMUNITY): Payer: Medicare Other | Admitting: Physical Therapy

## 2016-09-16 ENCOUNTER — Inpatient Hospital Stay (HOSPITAL_COMMUNITY): Payer: Medicare Other | Admitting: Occupational Therapy

## 2016-09-16 LAB — GLUCOSE, CAPILLARY
Glucose-Capillary: 116 mg/dL — ABNORMAL HIGH (ref 65–99)
Glucose-Capillary: 127 mg/dL — ABNORMAL HIGH (ref 65–99)
Glucose-Capillary: 129 mg/dL — ABNORMAL HIGH (ref 65–99)
Glucose-Capillary: 174 mg/dL — ABNORMAL HIGH (ref 65–99)

## 2016-09-16 NOTE — Progress Notes (Addendum)
Physical Therapy Session Note  Patient Details  Name: Eduardo Young MRN: [id number] Date of Birth: 10-18-1955  Today's Date: 09/16/2016 PT Individual Time: 1300-1405 PT Individual Time Calculation (min): 65 min   Short Term Goals: Week 1:  PT Short Term Goal 1 (Week 1): STG =LTG due to ELOS   Skilled Therapeutic Interventions/Progress Updates: Performed functional activities including car transfer supervision level with HW, bed mobility supine to/from sit, bed to/from w/c transfers all performed mod I and use of hemi walker. Pt attempted 3in step up however unable to complete due to fatigue after 3 attempts. Pt able to verbalize steps for transfers and activities and demonstrate with good safety. Pt propelled throughout unit using hemi-technique up to 237f.  Pt  Returned to room with call bell within reach and current needs met.      Therapy Documentation Precautions:  Precautions Precautions: Fall Precaution Comments: R hemiparesis Restrictions Weight Bearing Restrictions: No General:     See Function Navigator for Current Functional Status.   Therapy/Group: Individual Therapy  Brittiany Wiehe  Wilhelmina Hark, PTA   Addended 09/16/2016, 3:46 PM

## 2016-09-16 NOTE — Progress Notes (Signed)
Eduardo Young is a 61 y.o. male April 12, 1956 [id number]  Subjective: No new complaints. No new problems. Slept well. Feeling OK. Tired after therapies but anxious for DC tomorrow  Objective: Vital signs in last 24 hours: Temp:  [97.8 F (36.6 C)] 97.8 F (36.6 C) (02/24 0636) Pulse Rate:  [51-61] 51 (02/24 0636) Resp:  [17] 17 (02/24 0636) BP: (116-145)/(66-68) 133/66 (02/24 0636) SpO2:  [97 %] 97 % (02/24 0636) Weight change:  Last BM Date: 09/14/16  Intake/Output from previous day: 02/23 0701 - 02/24 0700 In: 480 [P.O.:480] Out: 1200 [Urine:1200]  Physical Exam General: No apparent distress    Lungs: Normal effort. Lungs clear to auscultation, no crackles or wheezes. Cardiovascular: Regular rate and rhythm, no edema Musculoskeletal:  Neurovascularly intact Neurological: No new neurological deficits R HP    Lab Results: BMET    Component Value Date/Time   NA 140 09/15/2016 0504   K 3.8 09/15/2016 0504   CL 107 09/15/2016 0504   CO2 26 09/15/2016 0504   GLUCOSE 128 (H) 09/15/2016 0504   BUN 35 (H) 09/15/2016 0504   CREATININE 1.75 (H) 09/15/2016 0504   CREATININE 1.70 (H) 08/22/2016 1208   CALCIUM 8.8 (L) 09/15/2016 0504   GFRNONAA 41 (L) 09/15/2016 0504   GFRNONAA 43 (L) 08/22/2016 1208   GFRAA 48 (L) 09/15/2016 0504   GFRAA 50 (L) 08/22/2016 1208   CBC    Component Value Date/Time   WBC 6.6 09/15/2016 0504   RBC 3.99 (L) 09/15/2016 0504   HGB 11.0 (L) 09/15/2016 0504   HCT 35.5 (L) 09/15/2016 0504   PLT 193 09/15/2016 0504   MCV 89.0 09/15/2016 0504   MCH 27.6 09/15/2016 0504   MCHC 31.0 09/15/2016 0504   RDW 15.2 09/15/2016 0504   LYMPHSABS 2.1 09/14/2016 0449   MONOABS 0.4 09/14/2016 0449   EOSABS 0.7 09/14/2016 0449   BASOSABS 0.0 09/14/2016 0449   CBG's (last 3):   Recent Labs  09/15/16 1712 09/15/16 2014 09/16/16 0645  GLUCAP 113* 207* 116*   LFT's Lab Results  Component Value Date   ALT 24 09/09/2016   AST 19 09/09/2016   ALKPHOS  71 09/09/2016   BILITOT 0.9 09/09/2016    Studies/Results: No results found.  Medications:  I have reviewed the patient's current medications. Scheduled Medications: . allopurinol  100 mg Oral BID  . aspirin EC  81 mg Oral QPM  . clonazePAM  0.5 mg Oral BID  . cloNIDine  0.1 mg Oral BID  . doxazosin  4 mg Oral Daily  . enoxaparin (LOVENOX) injection  70 mg Subcutaneous Q24H  . hydrALAZINE  50 mg Oral Q8H  . hydrochlorothiazide  25 mg Oral Daily  . insulin aspart  0-15 Units Subcutaneous TID WC  . insulin aspart  0-5 Units Subcutaneous QHS  . insulin glargine  20 Units Subcutaneous BID  . labetalol  300 mg Oral BID  . lisinopril  20 mg Oral BID  . potassium chloride  40 mEq Oral BID  . senna-docusate  2 tablet Oral QHS  . simvastatin  40 mg Oral QHS   PRN Medications: acetaminophen, alum & mag hydroxide-simeth, bisacodyl, diphenhydrAMINE, guaiFENesin-dextromethorphan, prochlorperazine **OR** prochlorperazine **OR** prochlorperazine, sodium phosphate, traZODone  Assessment/Plan: Principal Problem:   Spastic hemiparesis of right dominant side (HCC) Active Problems:   HLD (hyperlipidemia)   Sleep apnea   Weakness   Myoclonus   Seizures (HCC)   Benign essential HTN   Type 2 diabetes mellitus with peripheral neuropathy ([place])  Stage 3 chronic kidney disease   Adjustment disorder with mixed anxiety and depressed mood   Length of stay, days: 8  Continue CIR and supportive care Continue med tx as ongoing for chronic conditions and symptomatic relief Plan home tomorrow  Valerie A. Asa Lente, MD 09/16/2016, 8:09 AM

## 2016-09-16 NOTE — Progress Notes (Signed)
Occupational Therapy Discharge Summary  Patient Details  Name: Eduardo Young MRN: [id number] Date of Birth: 1956/03/08  Today's Date: 09/16/2016  Session 1 OT Individual Time: 3662-9476 OT Individual Time Calculation (min): 59 min   Session 2 OT Individual Time: 5465-0354 OT Individual Time Calculation (min): 57 min   Skilled Therapeutic Interventions Session 1 1:1 OT session focused on modified bathing/dressing, safety awareness, and toilet transfers.  Simulated home environment for UB/LB dressing bed level and EOB. Pt dons socks/shoes semi-reclined in bed by placing R leg over L knee and uses one-handed dressing techniques. Pt threads pant legs seated EOB using reacher with Verbal cues for modified technique. Sit<>stand w/ hemi-walker  to pull pants over hips w/ Mod I and increased time. UB dressing w/ mod I using hemi-techniques. Pt then practiced stand-pivot transfer and scooting transfer <> wide drop arm BSC. Pt able to scoot with increased time and Mod I. Discussed scooting transfer would be the safest option for toilet transfers at night. Pt left seated in wc at end of session with needs met.   Session 2 OT session focused on dc planning, simple iADL management at wc level, and home environment modifications. Pt self propelled wc to therapy apartment w/ mod I and increased time. Educated on proper w/c placement during meal prep and technique for accessing pantry and refrigerator.  Pt-therapist collaboration completed regarding kitchen modifications at home during completion of simulated meal prep at wc level- overall mod I. Pt then able to access pantry to get broom and swept kitchen wc level w/ mod I. Pt returned to room and left with needs met.   Patient has met 12 of 12 long term goals due to improved activity tolerance, improved balance, postural control and ability to compensate for deficits.  Patient to discharge at overall Supervision/mod I level.  Patient's care partner is  independent to provide the necessary physical assistance at discharge.    Reasons goals not met: n/a  Recommendation:  Patient will benefit from ongoing skilled OT services in outpatient setting to continue to advance functional skills in the area of BADL.  Equipment: Wide drop-arm BSC  Reasons for discharge: treatment goals met and discharge from hospital  Patient/family agrees with progress made and goals achieved: Yes  OT Discharge Precautions/Restrictions  Precautions Precautions: Fall Precaution Comments: R hemiparesis Restrictions Weight Bearing Restrictions: No Pain  denies pain ADL ADL Equipment Provided: Reacher, Long-handled sponge Eating: Modified independent Grooming: Modified independent Where Assessed-Grooming: Sitting at sink, Wheelchair Upper Body Bathing: Supervision/safety Where Assessed-Upper Body Bathing: Shower Lower Body Bathing: Supervision/safety Where Assessed-Lower Body Bathing: Shower Upper Body Dressing: Modified independent (Device) Where Assessed-Upper Body Dressing: Edge of bed Lower Body Dressing: Supervision/safety Where Assessed-Lower Body Dressing: Edge of bed, Bed level Toileting: Supervision/safety Toilet Transfer: Close supervision Toilet Transfer Method: Arts development officer: Extra wide drop arm bedside commode Tub/Shower Transfer: Close supervison Clinical cytogeneticist Method: Librarian, academic: Radio broadcast assistant ADL Comments: Please see functional navigator Cognition Overall Cognitive Status: History of cognitive impairments - at baseline Sensation Coordination Coordination and Movement Description: 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 Motor  Motor Motor: Hemiplegia Motor - Skilled Clinical Observations: Baseline Hemiplegia on the R.  Mobility  Transfers Sit to Stand: 6: Modified independent (Device/Increase time) Stand to Sit: 6: Modified independent  (Device/Increase time)  Balance Balance Balance Assessed: Yes Static Sitting Balance Static Sitting - Level of Assistance: 6: Modified independent (Device/Increase time) Dynamic Sitting Balance Dynamic  Sitting - Level of Assistance: 6: Modified independent (Device/Increase time) Dynamic Standing Balance Dynamic Standing - Balance Support: During functional activity;Left upper extremity supported Dynamic Standing - Level of Assistance: 5: Stand by assistance Extremity/Trunk Assessment RUE Assessment RUE Assessment: Exceptions to [facility] (R flaccid hemiplegia and shoulder subluxation from old CVA) LUE Assessment LUE Assessment: Within Functional Limits   See Function Navigator for Current Functional Status.  Daneen Schick Emilie Carp 09/16/2016, 3:54 PM

## 2016-09-16 NOTE — Progress Notes (Signed)
Physical Therapy Discharge Summary  Patient Details  Name: Eduardo Young MRN: [id number] Date of Birth: [birth year]  Today's Date: 09/16/2016  Patient has met 11 of 11 long term goals due to improved activity tolerance, improved balance, increased strength, ability to compensate for deficits and improved coordination.  Patient to discharge at a wheelchair level Modified Independent. Patient's care partner is independent to provide the necessary physical assistance at discharge.  Reasons goals not met: N/A  Recommendation:  Patient will benefit from ongoing skilled PT services in outpatient setting to continue to advance safe functional mobility, address ongoing impairments in strength, standing balance, R hemiparesis, compensatory strategies, activity tolerance, and minimize fall risk.  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 Motor  Motor Motor: Hemiplegia Motor - Skilled Clinical Observations: Baseline Hemiplegia on the R.   Mobility Bed Mobility Supine to Sit: With rails;6: Modified independent (Device/Increase time) Sit to Supine: With rail;6: Modified independent (Device/Increase time) Transfers Transfers: Yes Sit to Stand: 6: Modified independent (Device/Increase time) Stand to Sit: 6: Modified independent (Device/Increase time) Stand Pivot Transfers: 5: Supervision Locomotion  Ambulation Ambulation: Yes Ambulation/Gait Assistance: 5: Supervision Ambulation Distance (Feet): 5 Feet Assistive device:  Hemi-walker Ambulation/Gait Assistance Details: Verbal cues for gait pattern Gait Gait: Yes Gait Pattern: Step-to pattern;Right genu recurvatum;Right foot flat;Right steppage;Wide base of support Stairs / Additional Locomotion Stairs: No Architect: Yes Wheelchair Assistance: 6: Modified independent (Device/Increase time) Wheelchair Propulsion: Left upper extremity;Left lower extremity Distance: 231f  Trunk/Postural Assessment  Cervical Assessment Cervical Assessment: Within Functional Limits Thoracic Assessment Thoracic Assessment: Exceptions to [facility]umbar Assessment Lumbar Assessment: Exceptions to [facility]ural Control Postural Control: Deficits on evaluation  Balance Balance Balance Assessed: Yes Static Sitting Balance Static Sitting - Balance Support: Feet supported Static Sitting - Level of Assistance: 6: Modified independent (Device/Increase time) Dynamic Sitting Balance Dynamic Sitting - Balance Support: Feet supported Dynamic Sitting - Level of Assistance: 6: Modified independent (Device/Increase time) Static Standing Balance Static Standing - Balance Support: During functional activity Static Standing - Level of Assistance: 6: Modified independent (Device/Increase time) Dynamic Standing Balance Dynamic Standing - Balance Support: During functional activity;Left upper extremity supported Dynamic Standing - Level of Assistance: 5: Stand by assistance Extremity Assessment  RUE Assessment RUE Assessment: Ex[facility] to [facility](R flaccid hemiplegia and shoulder subluxation from old CVA) LUE Assessment LUE Assessment: Within Functional Limits RLE Assessment RLE Assessment: Exceptions to [facility] 4/5) LLE Assessment LLE Assessment: Within Functional Limits   See Function Navigator for Current Functional Status.  [name] 09/16/2016, 4:01 PM

## 2016-09-17 LAB — GLUCOSE, CAPILLARY: Glucose-Capillary: 132 mg/dL — ABNORMAL HIGH (ref 65–99)

## 2016-09-17 NOTE — Progress Notes (Signed)
See DC summary Pt feels well and ready for discharge today - stable for DC today No medical changes recommended - followup as planned

## 2016-09-17 NOTE — Progress Notes (Signed)
Patient discharged to his sisters house, accompanied by a family friend.

## 2016-09-21 ENCOUNTER — Telehealth: Payer: Self-pay | Admitting: *Deleted

## 2016-09-21 ENCOUNTER — Encounter: Payer: Self-pay | Admitting: Physical Therapy

## 2016-09-21 ENCOUNTER — Encounter: Payer: Self-pay | Admitting: Occupational Therapy

## 2016-09-21 ENCOUNTER — Ambulatory Visit: Payer: Medicare Other | Attending: Physical Medicine and Rehabilitation | Admitting: Physical Therapy

## 2016-09-21 ENCOUNTER — Ambulatory Visit: Payer: Medicare Other | Admitting: Occupational Therapy

## 2016-09-21 VITALS — BP 146/76 | HR 62

## 2016-09-21 DIAGNOSIS — I69353 Hemiplegia and hemiparesis following cerebral infarction affecting right non-dominant side: Secondary | ICD-10-CM

## 2016-09-21 DIAGNOSIS — I6990 Unspecified sequelae of unspecified cerebrovascular disease: Secondary | ICD-10-CM

## 2016-09-21 DIAGNOSIS — R2689 Other abnormalities of gait and mobility: Secondary | ICD-10-CM | POA: Diagnosis not present

## 2016-09-21 DIAGNOSIS — M79641 Pain in right hand: Secondary | ICD-10-CM | POA: Insufficient documentation

## 2016-09-21 DIAGNOSIS — R2681 Unsteadiness on feet: Secondary | ICD-10-CM

## 2016-09-21 DIAGNOSIS — G8113 Spastic hemiplegia affecting right nondominant side: Secondary | ICD-10-CM | POA: Diagnosis not present

## 2016-09-21 DIAGNOSIS — I679 Cerebrovascular disease, unspecified: Secondary | ICD-10-CM

## 2016-09-21 DIAGNOSIS — M545 Low back pain, unspecified: Secondary | ICD-10-CM

## 2016-09-21 DIAGNOSIS — G8929 Other chronic pain: Secondary | ICD-10-CM

## 2016-09-21 DIAGNOSIS — M6249 Contracture of muscle, multiple sites: Secondary | ICD-10-CM | POA: Insufficient documentation

## 2016-09-21 DIAGNOSIS — R208 Other disturbances of skin sensation: Secondary | ICD-10-CM | POA: Insufficient documentation

## 2016-09-21 DIAGNOSIS — R293 Abnormal posture: Secondary | ICD-10-CM | POA: Insufficient documentation

## 2016-09-21 DIAGNOSIS — M6281 Muscle weakness (generalized): Secondary | ICD-10-CM | POA: Insufficient documentation

## 2016-09-21 NOTE — Telephone Encounter (Signed)
Mr Kiss home VM was full and not receiving messages.  I left a message on [facility]) mobile VM with appt date, time to see Dr Posey Pronto. (arrival and appt time) and address and phone number.

## 2016-09-21 NOTE — Therapy (Signed)
[facility] [address] [place], [place], [address] Phone: [phone]   Fax:  [phone]  Physical Therapy Evaluation  Patient Details  Name: Eduardo Young MRN: [id number] Date of Birth: October 13, 1955 Referring Provider: Alger Simons, MD  Encounter Date: 09/21/2016      PT End of Session - 09/21/16 1934    Visit Number 1   Number of Visits 17   Date for PT Re-Evaluation 11/20/16   Authorization Type Medicare G-code & progress note every 10th visit   PT Start Time 1018   PT Stop Time 1100   PT Time Calculation (min) 42 min   Equipment Utilized During Treatment Gait belt   Activity Tolerance Patient limited by fatigue;Patient tolerated treatment well;Patient limited by pain   Behavior During Therapy [facility] for tasks assessed/performed      Past Medical History:  Diagnosis Date  . Chronic venous insufficiency   . Diabetes mellitus   . GERD (gastroesophageal reflux disease)   . Gout   . Hemiparesis ([place])   . Hyperlipidemia   . Hypertension   . Obesity   . Renal calculi   . Sleep apnea, obstructive   . Stroke [facility])    [id number]    Past Surgical History:  Procedure Laterality Date  . LITHOTRIPSY    . NASAL POLYP EXCISION      There were no vitals filed for this visit.       Subjective Assessment - 09/21/16 1025    Subjective He had increased weakness on 09/07/2016 with inability getting off toilet. Pt was admitted from possible new CVA but CT was negative for changes. He was on inpatient [facility] 09/08/2016 - 09/16/2016.  He was referred for [facility] for [facility] & OT.    Patient is accompained by: Family member  sister, susan   Pertinent History CVA with right hemiparesis 2008, chronic venous insufficiency, DM2, GERD, gout, HLD, HTN, morbid obesity, sleep apnea,    Limitations Standing;Walking;House hold activities   Patient Stated Goals To improve activity level to what he is capable of  doing.    Currently in Pain? Yes   Pain Score 6   in last week, worst 7/10, best 3/10   Pain Location Back   Pain Orientation Right;Lower   Pain Descriptors / Indicators Sore   Pain Type Chronic pain   Pain Onset More than a month ago   Pain Frequency Constant   Aggravating Factors  sitting long periods   Pain Relieving Factors laying down   Effect of Pain on Daily Activities limits sitting up.    Multiple Pain Sites No            [facility] PT Assessment - 09/21/16 1015      Assessment   Medical Diagnosis late effects of CVA   Referring Provider Alger Simons, MD   Onset Date/Surgical Date 09/07/16  increased weakness   Hand Dominance Left   Prior Therapy [facility]patient [facility] 8 days     Precautions   Precautions Fall     Balance Screen   Has the patient fallen in the past 6 months Yes   How many times? 2  fallen out of w/c   Has the patient had a decrease in activity level because of a fear of falling?  Yes   Is the patient reluctant to leave their home because of a fear of falling?  Yes     [place] residence  Living Arrangements Other relatives  Sister   Type of [place] Access Level entry  slight incline to walkway   Home Layout One level   Home Equipment Bedside commode;Tub bench;Hand held shower head;Wheelchair - Gaffer,      Prior Function   Level of Independence Independent;Independent with household mobility with device  8-10 steps with hemiwalker alone     Observation/Other Assessments   Focus on Therapeutic Outcomes (FOTO)  np     Posture/Postural Control   Posture/Postural Control Postural limitations   Postural Limitations Rounded Shoulders;Forward head;Increased lumbar lordosis;Flexed trunk;Weight shift left     ROM / Strength   AROM / PROM / Strength AROM;Strength     AROM   Overall AROM  Deficits   AROM Assessment Site Hip;Lumbar;Knee;Ankle   Right/Left Hip  Right;Left   Right Hip Extension -20  standing   Left Hip Extension -15  standing   Right/Left Knee Right;Left   Right Knee Extension -6   Left Knee Extension -28   Right/Left Ankle Right   Right Ankle Dorsiflexion -18   Lumbar Flexion 50% of AROM   Lumbar Extension 25% of AROM   Lumbar - Left Side Bend 25% of AROM   Lumbar - Right Rotation 25% of AROM   Lumbar - Left Rotation 25% of AROM     Strength   Overall Strength Deficits   Strength Assessment Site Hip;Knee;Ankle   Right/Left Hip Right   Right Hip Flexion 3-/5   Right Hip Extension 2-/5   Right Hip ABduction 2-/5   Right/Left Knee Right   Right Knee Flexion 3-/5   Right Knee Extension 3-/5   Right/Left Ankle Right   Right Ankle Dorsiflexion 2-/5   Right Ankle Plantar Flexion 1/5     Transfers   Transfers Sit to Stand;Stand to Sit   Sit to Stand 5: Supervision;With upper extremity assist;With armrests;From chair/3-in-1  uses back of legs against chair & hemiwalker to stabilize   Stand to Sit 5: Supervision;With upper extremity assist;With armrests;To chair/3-in-1;Uncontrolled descent  uses back of legs against chair     Ambulation/Gait   Ambulation/Gait Yes   Ambulation/Gait Assistance 4: Min assist   Ambulation/Gait Assistance Details advances RLE 4" in swing with LLE toe advances 4" posterior to RLE toe   Ambulation Distance (Feet) 5 Feet   Assistive device Hemi-walker   Gait Pattern Step-to pattern;Decreased arm swing - right;Decreased step length - right;Decreased step length - left;Decreased stance time - right;Decreased stride length;Decreased hip/knee flexion - right;Decreased dorsiflexion - right;Decreased weight shift to right;Right hip hike;Right steppage;Right foot flat;Right flexed knee in stance;Lateral hip instability;Decreased trunk rotation;Trunk rotated posteriorly on right;Trunk flexed;Abducted- right;Poor foot clearance - right   Ambulation Surface Indoor;Level   Gait velocity 0.04 ft/sec      Balance   Balance Assessed Yes     Static Standing Balance   Static Standing - Balance Support Left upper extremity supported  hemiwalker support   Static Standing - Level of Assistance 5: Stand by assistance   Static Standing - Comment/# of Minutes 1 minute      Dynamic Standing Balance   Dynamic Standing - Balance Support Left upper extremity supported;No upper extremity supported;During functional activity  LUE hemiwalker scanning, back of legs against chair w/o UE   Dynamic Standing - Level of Assistance 4: Min assist;5: Stand by assistance   Dynamic Standing - Balance Activities Head turns;Head nods;Reaching for objects   Dynamic Standing - Comments LUE on  hemiwalker: scans looks to side only with decreased wt shift to rt, looks up with minA for balance.  Without UE support uses back of legs against chair to stabilize to manage pants.                               PT Short Term Goals - 09/21/16 1954      PT SHORT TERM GOAL #1   Title Patient demonstrates understanding of initial HEP. (Target Date: 10/20/2016)   Time 4   Period Weeks   Status New     PT SHORT TERM GOAL #2   Title Patient performs stand pivot transfer with AD with supervision.  (Target Date: 10/20/2016)   Time 4   Period Weeks   Status New     PT SHORT TERM GOAL #3   Title Patient demonstrates weight shift onto right LE in stance with cueing.  (Target Date: 10/20/2016)   Time 4   Period Weeks   Status New           PT Long Term Goals - 09/21/16 1948      PT LONG TERM GOAL #1   Title Patient and sister demonstrate understanding of ongoing HEP. (Target Date: 11/17/2016)   Time 8   Period Weeks   Status New     PT LONG TERM GOAL #2   Title Patient standing balance: looks over shoulders & up with UE support, manages clothes & reaches 5" without UE support modified independent.  (Target Date: 11/17/2016)   Time 8   Period Weeks   Status New     PT LONG TERM GOAL #3   Title  Patient verbalizes recommendations for equipment to maximize independence and proper use. (Target Date: 11/17/2016)   Time 8   Period Weeks   Status New     PT LONG TERM GOAL #4   Title Patient ambulates 10' with AD modified independent. (Target Date: 11/17/2016)   Time 8   Period Weeks   Status New               Plan - 09/21/16 1828    Clinical Impression Statement Patient has increased weakness with incident from 09/07/2016. He has decreased mobility. He was able to ambulate short distances in his home prior to incident but now requires assistance. He has hemiparesis on right side UE, trunk & LE with increased weakness per family & pt report. Patient has impaired balance with high fall risk. Patient's gait has high fall risk with significant deviations limiting safety and function. Patient has poor clearance & weight shift to right lower exetremity. Patient would benefit from power wheelchair but patient is uncertain at this time.    Rehab Potential Good   Clinical Impairments Affecting Rehab Potential CVA with right hemiparesis 2008, chronic venous insufficiency, DM2, GERD, gout, HLD, HTN, morbid obesity, sleep apnea,   PT Frequency 2x / week   PT Duration 8 weeks   PT Treatment/Interventions ADLs/Self Care Home Management;Moist Heat;Ultrasound;DME Instruction;Gait training;Stair training;Functional mobility training;Therapeutic activities;Therapeutic exercise;Balance training;Neuromuscular re-education;Patient/family education;Wheelchair mobility training   PT Next Visit Plan HEP for flexibility, back on track, discuss power w/c further   Recommended Other Services patient would benefit from power wheelchair.   Consulted and Agree with Plan of Care Patient;Family member/caregiver   Family Member Consulted sister, Manuela Schwartz      Patient will benefit from skilled therapeutic intervention in order to improve the following deficits and  impairments:  Abnormal gait, Decreased activity  tolerance, Decreased balance, Decreased endurance, Decreased knowledge of precautions, Decreased knowledge of use of DME, Decreased mobility, Decreased safety awareness, Decreased strength, Impaired flexibility, Postural dysfunction, Pain  Visit Diagnosis: Hemiplegia and hemiparesis following cerebral infarction affecting right non-dominant side (HCC)  Muscle weakness (generalized)  Contracture of muscle, multiple sites  Chronic right-sided low back pain without sciatica  Other abnormalities of gait and mobility  Unsteadiness on feet      G-Codes - 09/27/16 1958    Functional Assessment Tool Used (Outpatient Only) Patient ambulates 5' in 2 minutes with minA.    Functional Limitation Mobility: Walking and moving around   Mobility: Walking and Moving Around Current Status [phone]) At least 80 percent but less than 100 percent impaired, limited or restricted   Mobility: Walking and Moving Around Goal Status [phone]) At least 60 percent but less than 80 percent impaired, limited or restricted       Problem List Patient Active Problem List   Diagnosis Date Noted  . Adjustment disorder with mixed anxiety and depressed mood   . Benign essential HTN   . Type 2 diabetes mellitus with peripheral neuropathy (HCC)   . Stage 3 chronic kidney disease   . Spastic hemiparesis of right dominant side ([place]) 09/08/2016  . Weakness   . Myoclonus   . Seizures ([place])   . TIA (transient ischemic attack) 09/07/2016  . Chronic venous insufficiency   . History of cerebral infarction ([place]) 10/21/2010  . DM (diabetes mellitus) ([place]) 10/21/2010  . HLD (hyperlipidemia) 10/21/2010  . HTN (hypertension) 10/21/2010  . Sleep apnea 10/21/2010    Jamey Reas PT, DPT September 27, 2016, 7:59 PM  [facility] [address] [place], [place], [address] Phone: [phone]   Fax:  [phone]  Name: Eduardo Young MRN: [id number] Date of Birth:  May 29, 1956

## 2016-09-21 NOTE — Therapy (Signed)
[facility] [address] [place], [place], [address] Phone: [phone]   Fax:  [phone]  Occupational Therapy Evaluation  Patient Details  Name: Eduardo Young MRN: [id number] Date of Birth: 1956-07-06 Referring Provider: Dr. Naaman Plummer  Encounter Date: 09/21/2016      OT End of Session - 09/21/16 1422    Visit Number 1   Number of Visits 16   Date for OT Re-Evaluation 11/16/16   Authorization Type medicare will need G code and PN every 10th visit   Authorization Time Period 60 days   Authorization - Visit Number 1   Authorization - Number of Visits 10   OT Start Time 1103   OT Stop Time 1144   OT Time Calculation (min) 41 min   Activity Tolerance Patient tolerated treatment well      Past Medical History:  Diagnosis Date  . Chronic venous insufficiency   . Diabetes mellitus   . GERD (gastroesophageal reflux disease)   . Gout   . Hemiparesis ([place])   . Hyperlipidemia   . Hypertension   . Obesity   . Renal calculi   . Sleep apnea, obstructive   . Stroke [facility])    [id number]    Past Surgical History:  Procedure Laterality Date  . LITHOTRIPSY    . NASAL POLYP EXCISION      Vitals:   09/21/16 1112  BP: (!) 146/76  Pulse: 62        Subjective Assessment - 09/21/16 1109    Subjective  I answer really slow because of my stroke   Patient is accompained by: Family member  sister Eduardo Young   Pertinent History see epic. Pt with initial stroke in 2008, recently admitted to hospital thru ED on 09/07/2016 pt experiencing increased weakness in RLE - CT negative, overall pt has been declining over past year.   Currently in Pain? Yes   Pain Score 6    Pain Location Flank   Pain Orientation Right   Pain Descriptors / Indicators Sore   Pain Type Chronic pain   Pain Onset More than a month ago   Pain Frequency Intermittent   Aggravating Factors  sitting long periods   Pain Relieving Factors laying down    Multiple Pain Sites Yes   Pain Score 6   Pain Location Leg  knee to foot   Pain Orientation Right   Pain Descriptors / Indicators Aching   Pain Type Chronic pain   Pain Frequency Constant   Aggravating Factors  sitting makes it worse, standing, walking   Pain Relieving Factors laying down           [facility] OT Assessment - 09/21/16 1117      Assessment   Diagnosis L CVA 2008   Referring Provider Dr. Naaman Plummer   Onset Date --  initial stroke in 2008; pt with decline over past year     Precautions   Precautions Fall     Restrictions   Weight Bearing Restrictions No     Balance Screen   Has the patient fallen in the past 6 months Yes   How many times? at least 4- 6 times  Pt has PT eval today     Home  Environment   Family/patient expects to be discharged to: Private residence   Living Arrangements Other relatives  sister, 2 dogs and a cat   Available Help at Discharge Available PRN/intermittently  sister works 24 on/72 off   Type of Home  House   Home Layout One level   Bathroom Air traffic controller Standard  3 in 1 commode seat in bathroom and one in bedroom   Additional Comments transfer tub bench, no grab bars.     Prior Function   Level of Independence Independent   Vocation On disability     ADL   Eating/Feeding Minimal assistance  for cutting only   Grooming Independent   Upper Body Bathing Modified independent   Lower Body Bathing Modified independent   Upper Body Dressing --  mod I- sits EOB to dress UB   Lower Body Dressing Modified independent  lays in bed; if showers sister dresses LB   Toilet Tranfer Modified independent  walks into bathroom with hemi walker   Toileting - Clothing Manipulation Independent   Toileting -  Hygiene Modified Independent  sister reports if an accident needs assist for hygiene   Tub/Shower Transfer Minimal assistance  assist to get RLE in and out of tub     IADL   Shopping Completely unable to  shop   Light Housekeeping Does not participate in any housekeeping tasks   Meal Prep Able to complete simple cold meal and snack prep;Able to complete simple warm meal prep  uses microwave   Community Mobility Relies on family or friends for transportation   Medication Management Is not capable of dispensing or managing own medication   Financial Management Requires assistance     Mobility   Mobility Status History of falls   Mobility Status Comments see PT eval     Written Expression   Dominant Hand Left   Written Experience Within Functional Limits     Vision - History   Baseline Vision Wears glasses only for reading   Additional Comments Pt denies visual changes     Vision Assessment   Comment Vision WFL's     Activity Tolerance   Activity Tolerance Tolerates 30 min activity with muliple rests     Cognition   Overall Cognitive Status History of cognitive impairments - at baseline   Mini Mental State Exam  Pt with cognitive deficits from 2008 - sister states no worse     Sensation   Light Touch Impaired by gross assessment   Hot/Cold Impaired by gross assessment   Proprioception Impaired by gross assessment   Additional Comments Pt can feel light touch but unable to localize; can feel movement but unable to tell direction     Coordination   Gross Motor Movements are Fluid and Coordinated No   Fine Motor Movements are Fluid and Coordinated No   Other Pt has extremely limited movement in RUE and unable to participate in any standardized testing     Edema   Edema severe edema in L hand     Tone   Assessment Location Right Upper Extremity     ROM / Strength   AROM / PROM / Strength AROM;PROM;Strength     AROM   Overall AROM  Deficits   Overall AROM Comments RUE - pt with approximately 10* of shoulder flexion/abduction, some initaition for elbow flexion/extension all in active assistive.  Pt has no movement in R hand and has spasticity and severe edema in R hand .       PROM   Overall PROM  Deficits   Overall PROM Comments Pt with shoulder flexion to approximately 120* with no pain, just tightness. Sensation however is impaired.  All other for PROM WFL"s.  Strength   Overall Strength Deficits;Unable to assess   Overall Strength Comments unable to accurately assess due to marked tone however pt likely only wiht 1/5 proximally in RUE.      Hand Function   Left Hand Gross Grasp Functional   Comment Pt has no isolated active movement in RUE     RUE Tone   RUE Tone Moderate  extensor tone proximally and flexor tone in hand RUE                           OT Short Term Goals - 09/21/16 1247      OT SHORT TERM GOAL #1   Title Pt and sister will be mod I with HEP - 10/19/2016   Status New     OT SHORT TERM GOAL #2   Title Pt and sister will be able to identify 2 strategies for normalizing tone   Status New     OT SHORT TERM GOAL #3   Title Pt and sister will be mod I with splint wear and care   Status New     OT SHORT TERM GOAL #4   Title Pt and sister will be mod I for UE positioing in wheelchair and in bed     OT SHORT TERM GOAL #5   Status New           OT Long Term Goals - 09/21/16 1248      OT LONG TERM GOAL #1   Title Pt and sister will be mod I with ugraded HEP PRN- 11/16/2016   Status New     OT LONG TERM GOAL #2   Title Pt and sister will be mod I with edema control techniques for RUE   Status New     OT LONG TERM GOAL #3   Title Pt will demonstrate adequate balance for safety and increased ease with basic transfers in the home (toilet and bed)   Status New     OT LONG TERM GOAL #4   Title Pt will be able to demostrate adequate standing balance without UE to enage in simple ADL activities.    Status New               Plan - 09/21/16 1415    Clinical Impression Statement Pt is a 61 year old male s/p L CVA in 2008;  recently pt admitted to the hospital with increased weakness in RLE - no  acute findings however sister reported that pt has been declining in mobility and activity toleranc over the past year.  [place]: DM, OSA, HTN, aphasia.  Pt presents with the following impairments that impact his ability for increased independence and ease for basic ADL and leisure tasks:  R non dominant spastic hemiplegia, pain in R flank, pain in R hand, decreased PROM, impaired sensation, severe edema in R hand, impaired cognition, aphasia, abnormal trunk control, decreased activity tolerance.  Pt will benefit from skilled OT to maximize safety and independence in basic ADL and leisure activities.     Rehab Potential Fair   Clinical Impairments Affecting Rehab Potential severity of deficits, lenght of time since onset   OT Frequency 2x / week   OT Duration 8 weeks   OT Treatment/Interventions Self-care/ADL training;Therapeutic exercise;Neuromuscular education;DME and/or AE instruction;Passive range of motion;Manual Therapy;Functional Mobility Training;Splinting;Therapeutic activities;Patient/family education;Balance training   Plan edema management strategies, bed positioing, wheelchair positioning, initiate HEP for RUE if possible   Consulted  and Agree with Plan of Care Patient;Family member/caregiver   Family Member Consulted sister      Patient will benefit from skilled therapeutic intervention in order to improve the following deficits and impairments:  Abnormal gait, Decreased activity tolerance, Decreased balance, Decreased cognition, Decreased mobility, Decreased strength, Difficulty walking, Impaired UE functional use, Impaired tone, Impaired sensation, Pain  Visit Diagnosis: Late effects of cerebrovascular accident - Plan: Ot plan of care cert/re-cert  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, unspecified cerebrovascular disease type ([place]) - Plan: Ot plan of care cert/re-cert  Pain in right hand - Plan: Ot plan of care cert/re-cert  Other disturbances  of skin sensation - Plan: Ot plan of care cert/re-cert  Abnormal posture - Plan: Ot plan of care cert/re-cert  Unsteadiness on feet - Plan: Ot plan of care cert/re-cert      G-Codes - 80/22/33 1427    Functional Assessment Tool Used (Outpatient only) clinical judgement   Functional Limitation Self care   Self Care Current Status [id number]) At least 40 percent but less than 60 percent impaired, limited or restricted   Self Care Goal Status (S9753) At least 20 percent but less than 40 percent impaired, limited or restricted      Problem List Patient Active Problem List   Diagnosis Date Noted  . Adjustment disorder with mixed anxiety and depressed mood   . Benign essential HTN   . Type 2 diabetes mellitus with peripheral neuropathy (HCC)   . Stage 3 chronic kidney disease   . Spastic hemiparesis of right dominant side ([place]) 09/08/2016  . Weakness   . Myoclonus   . Seizures ([place])   . TIA (transient ischemic attack) 09/07/2016  . Chronic venous insufficiency   . History of cerebral infarction ([place]) 10/21/2010  . DM (diabetes mellitus) ([place]) 10/21/2010  . HLD (hyperlipidemia) 10/21/2010  . HTN (hypertension) 10/21/2010  . Sleep apnea 10/21/2010    [name], OTR/L 09/21/2016, 2:29 PM  [facility] [address] [place], [place], [address] Phone: [phone]   Fax:  [phone]  Name: Eduardo Young MRN: [id number] Date of Birth: Nov 13, 1955

## 2016-09-25 ENCOUNTER — Encounter: Payer: Self-pay | Admitting: Occupational Therapy

## 2016-09-25 ENCOUNTER — Ambulatory Visit: Payer: Medicare Other | Admitting: Occupational Therapy

## 2016-09-25 DIAGNOSIS — M545 Low back pain: Secondary | ICD-10-CM | POA: Diagnosis not present

## 2016-09-25 DIAGNOSIS — M6281 Muscle weakness (generalized): Secondary | ICD-10-CM

## 2016-09-25 DIAGNOSIS — I69353 Hemiplegia and hemiparesis following cerebral infarction affecting right non-dominant side: Secondary | ICD-10-CM | POA: Diagnosis not present

## 2016-09-25 DIAGNOSIS — R2689 Other abnormalities of gait and mobility: Secondary | ICD-10-CM | POA: Diagnosis not present

## 2016-09-25 DIAGNOSIS — G8113 Spastic hemiplegia affecting right nondominant side: Secondary | ICD-10-CM

## 2016-09-25 DIAGNOSIS — R208 Other disturbances of skin sensation: Secondary | ICD-10-CM

## 2016-09-25 DIAGNOSIS — G8929 Other chronic pain: Secondary | ICD-10-CM | POA: Diagnosis not present

## 2016-09-25 DIAGNOSIS — M6249 Contracture of muscle, multiple sites: Secondary | ICD-10-CM | POA: Diagnosis not present

## 2016-09-25 DIAGNOSIS — M79641 Pain in right hand: Secondary | ICD-10-CM

## 2016-09-25 DIAGNOSIS — R2681 Unsteadiness on feet: Secondary | ICD-10-CM

## 2016-09-25 DIAGNOSIS — I679 Cerebrovascular disease, unspecified: Principal | ICD-10-CM

## 2016-09-25 DIAGNOSIS — R293 Abnormal posture: Secondary | ICD-10-CM

## 2016-09-25 NOTE — Therapy (Signed)
[facility] [address] [place], [place], [address] Phone: [phone]   Fax:  [phone]  Occupational Therapy Treatment  Patient Details  Name: Eduardo Young MRN: [id number] Date of Birth: Jan 26, 1956 Referring Provider: Dr. Naaman Plummer  Encounter Date: 09/25/2016      OT End of Session - 09/25/16 1632    Visit Number 2   Number of Visits 16   Date for OT Re-Evaluation 11/16/16   Authorization Type medicare will need G code and PN every 10th visit   Authorization Time Period 60 days   Authorization - Visit Number 2   Authorization - Number of Visits 10   OT Start Time 1535   OT Stop Time 1625   OT Time Calculation (min) 50 min   Activity Tolerance Patient tolerated treatment well      Past Medical History:  Diagnosis Date  . Chronic venous insufficiency   . Diabetes mellitus   . GERD (gastroesophageal reflux disease)   . Gout   . Hemiparesis ([place])   . Hyperlipidemia   . Hypertension   . Obesity   . Renal calculi   . Sleep apnea, obstructive   . Stroke [facility])    [id number]    Past Surgical History:  Procedure Laterality Date  . LITHOTRIPSY    . NASAL POLYP EXCISION      There were no vitals filed for this visit.      Subjective Assessment - 09/25/16 1540    Patient is accompained by: Family member  sister in waiting room   Pertinent History see epic. Pt with initial stroke in 2008, recently admitted to hospital thru ED on 09/07/2016 pt experiencing increased weakness in RLE - CT negative, overall pt has been declining over past year.   Patient Stated Goals to move around better    Currently in Pain? No/denies                      OT Treatments/Exercises (OP) - 09/25/16 0001      ADLs   Overall ADLs Reviewed goals and POC  pt in agreement and written copy provided   UB Dressing Issued compression glove to assist with reducing edema pt to wear during day only   ADL Comments  Demonstrated retrograde massage to pt and pt's sister with good results.  Also reviewed RUE positioning in wheelchair to decrease shoulder pain and reduce edema. Sister took pictures for carry over.  Practice squat pivot transfers wheelchair to mat to decrease potential falls when pt is at home alone.  Reviewed bed positioning to decrease shoulder pain, edema, improve alignment and decrease reflex influence.  Sister took pictures for carry over. Practiced supine to sidelying to sit to encourage more normal movement pattern - pt able to recall steps after practice                OT Education - 09/25/16 1629    Education provided Yes   Education Details edema massage, positioning in wheelchair and bed, bed mobility          OT Short Term Goals - 09/25/16 1630      OT SHORT TERM GOAL #1   Title Pt and sister will be mod I with HEP - 10/19/2016   Status New     OT SHORT TERM GOAL #2   Title Pt and sister will be able to identify 2 strategies for normalizing tone   Status New  OT SHORT TERM GOAL #3   Title Pt and sister will be mod I with splint wear and care   Status New     OT SHORT TERM GOAL #4   Title Pt and sister will be mod I for UE positioing in wheelchair and in bed     OT SHORT TERM GOAL #5   Status New           OT Long Term Goals - 09/25/16 1630      OT LONG TERM GOAL #1   Title Pt and sister will be mod I with ugraded HEP PRN- 11/16/2016   Status New     OT LONG TERM GOAL #2   Title Pt and sister will be mod I with edema control techniques for RUE   Status New     OT LONG TERM GOAL #3   Title Pt will demonstrate adequate balance for safety and increased ease with basic transfers in the home (toilet and bed)   Status New     OT LONG TERM GOAL #4   Title Pt will be able to demostrate adequate standing balance without UE to enage in simple ADL activities.    Status New               Plan - 09/25/16 1631    Clinical Impression Statement Pt  progressing toward goals. Sister to assist with edema mgmt and positioning   Rehab Potential Fair   Clinical Impairments Affecting Rehab Potential severity of deficits, lenght of time since onset   OT Frequency 2x / week   OT Duration 8 weeks   OT Treatment/Interventions Self-care/ADL training;Therapeutic exercise;Neuromuscular education;DME and/or AE instruction;Passive range of motion;Manual Therapy;Functional Mobility Training;Splinting;Therapeutic activities;Patient/family education;Balance training   Plan check all instruction from last week, NMR for RUE/trunk, functional mobility, HEP for RUE   Consulted and Agree with Plan of Care Patient;Family member/caregiver   Family Member Consulted sister      Patient will benefit from skilled therapeutic intervention in order to improve the following deficits and impairments:  Abnormal gait, Decreased activity tolerance, Decreased balance, Decreased cognition, Decreased mobility, Decreased strength, Difficulty walking, Impaired UE functional use, Impaired tone, Impaired sensation, Pain  Visit Diagnosis: 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 Adjustment disorder with mixed anxiety and depressed mood   . Benign essential HTN   . Type 2 diabetes mellitus with peripheral neuropathy (HCC)   . Stage 3 chronic kidney disease   . Spastic hemiparesis of right dominant side ([place]) 09/08/2016  . Weakness   . Myoclonus   . Seizures ([place])   . TIA (transient ischemic attack) 09/07/2016  . Chronic venous insufficiency   . History of cerebral infarction ([place]) 10/21/2010  . DM (diabetes mellitus) ([place]) 10/21/2010  . HLD (hyperlipidemia) 10/21/2010  . HTN  (hypertension) 10/21/2010  . Sleep apnea 10/21/2010    [name] , OTR/L 09/25/2016, 4:33 PM  [facility] [address] [place], [place], [address] Phone: [phone]   Fax:  [phone]  Name: Eduardo Young MRN: [id number] Date of Birth: [birth year]

## 2016-09-27 ENCOUNTER — Encounter: Payer: Self-pay | Admitting: Physical Therapy

## 2016-09-27 ENCOUNTER — Ambulatory Visit: Payer: Medicare Other | Admitting: Physical Therapy

## 2016-09-27 DIAGNOSIS — G8113 Spastic hemiplegia affecting right nondominant side: Secondary | ICD-10-CM

## 2016-09-27 DIAGNOSIS — M6281 Muscle weakness (generalized): Secondary | ICD-10-CM

## 2016-09-27 DIAGNOSIS — R2681 Unsteadiness on feet: Secondary | ICD-10-CM

## 2016-09-27 DIAGNOSIS — R2689 Other abnormalities of gait and mobility: Secondary | ICD-10-CM

## 2016-09-27 DIAGNOSIS — G8929 Other chronic pain: Secondary | ICD-10-CM | POA: Diagnosis not present

## 2016-09-27 DIAGNOSIS — I679 Cerebrovascular disease, unspecified: Principal | ICD-10-CM

## 2016-09-27 DIAGNOSIS — R293 Abnormal posture: Secondary | ICD-10-CM

## 2016-09-27 DIAGNOSIS — M545 Low back pain: Secondary | ICD-10-CM | POA: Diagnosis not present

## 2016-09-27 DIAGNOSIS — M6249 Contracture of muscle, multiple sites: Secondary | ICD-10-CM | POA: Diagnosis not present

## 2016-09-27 DIAGNOSIS — I69353 Hemiplegia and hemiparesis following cerebral infarction affecting right non-dominant side: Secondary | ICD-10-CM | POA: Diagnosis not present

## 2016-09-27 NOTE — Patient Instructions (Addendum)
Straight Leg Raise    Tighten stomach and slowly raise locked right leg _3-4___ inches from floor. Repeat _10_ times per set. Do __1_ sets per session. Do _1-2_ sessions per day.  http://orth.exer.us/1102   Copyright  VHI. All rights reserved.    KNEE: Extension, Long Arc Quads - Sitting    Raise right leg until knee is straight and slowly lower foot back down. Perform 10 reps. 1 set. 1-2 times a day.  Copyright  VHI. All rights reserved.  Hip (Front)    In sitting position: march your right leg by lifting the foot off the floor.  Repeat for 10 reps. 1-2 times a day.  Copyright  VHI. All rights reserved.   Hamstring Stretch, Seated (Strap, Two Chairs)    Sit with one leg extended onto facing chair. Place your right foot onto seat of second chair. No strap is needed, just keep your right knee straight and lean forward. You should feel a pull in the back of the leg.  Hold for 2 minutes. Repeat __10__ times each leg.  Copyright  VHI. All rights reserved.

## 2016-09-27 NOTE — Therapy (Signed)
[facility] [address] [place], [place], [address] Phone: [phone]   Fax:  [phone]  Physical Therapy Treatment  Patient Details  Name: Eduardo Young MRN: [id number] Date of Birth: 01-28-1956 Referring Provider: Alger Simons, MD  Encounter Date: 09/27/2016      PT End of Session - 09/27/16 0807    Visit Number 2   Number of Visits 17   Date for PT Re-Evaluation 11/20/16   Authorization Type Medicare G-code & progress note every 10th visit   PT Start Time 0805   PT Stop Time 0845   PT Time Calculation (min) 40 min   Equipment Utilized During Treatment Gait belt   Activity Tolerance Patient limited by fatigue;Patient tolerated treatment well;Patient limited by pain   Behavior During Therapy [facility] for tasks assessed/performed      Past Medical History:  Diagnosis Date  . Chronic venous insufficiency   . Diabetes mellitus   . GERD (gastroesophageal reflux disease)   . Gout   . Hemiparesis ([place])   . Hyperlipidemia   . Hypertension   . Obesity   . Renal calculi   . Sleep apnea, obstructive   . Stroke [facility])    [id number]    Past Surgical History:  Procedure Laterality Date  . LITHOTRIPSY    . NASAL POLYP EXCISION      There were no vitals filed for this visit.      Subjective Assessment - 09/27/16 0807    Subjective No new complaints. No falls to report. No pain reported today.   Patient is accompained by: Family member   Pertinent History CVA with right hemiparesis 2008, chronic venous insufficiency, DM2, GERD, gout, HLD, HTN, morbid obesity, sleep apnea,    Limitations Standing;Walking;House hold activities   Patient Stated Goals To improve activity level to what he is capable of doing.    Currently in Pain? No/denies   Pain Score 0-No pain             [facility] Adult PT Treatment/Exercise - 09/27/16 0809      Transfers   Transfers Sit to Stand;Stand to Sit;Stand Pivot Transfers   Sit  to Stand 4: Min guard   Sit to Stand Details Verbal cues for sequencing;Verbal cues for technique;Verbal cues for precautions/safety;Verbal cues for safe use of DME/AE;Manual facilitation for weight shifting;Manual facilitation for weight bearing   Stand to Sit 4: Min guard   Stand to Sit Details (indicate cue type and reason) Visual cues/gestures for sequencing;Verbal cues for sequencing;Verbal cues for precautions/safety;Verbal cues for safe use of DME/AE;Manual facilitation for weight bearing;Manual facilitation for weight shifting   Stand Pivot Transfers 4: Min assist   Stand Pivot Transfer Details (indicate cue type and reason) with hemiwalker wheelchair<>mat table, cues on posture, sequencing and safety with transfers.      issued the following to pt's HEP: Straight Leg Raise    Tighten stomach and slowly raise locked right leg _3-4___ inches from floor. Repeat _10_ times per set. Do __1_ sets per session. Do _1-2_ sessions per day.  http://orth.exer.us/1102   Copyright  VHI. All rights reserved.    KNEE: Extension, Long Arc Quads - Sitting    Raise right leg until knee is straight and slowly lower foot back down. Perform 10 reps. 1 set. 1-2 times a day.  Copyright  VHI. All rights reserved.  Hip (Front)    In sitting position: march your right leg by lifting the foot off the floor.  Repeat  for 10 reps. 1-2 times a day.  Copyright  VHI. All rights reserved.   Hamstring Stretch, Seated (Strap, Two Chairs)    Sit with one leg extended onto facing chair. Place your right foot onto seat of second chair. No strap is needed, just keep your right knee straight and lean forward. You should feel a pull in the back of the leg.  Hold for 2 minutes. Repeat __10__ times each leg.  Copyright  VHI. All rights reserved.             PT Education - 09/27/16 1509    Education provided Yes   Education Details HEP: for LE strengthening and stretching   Person(s)  Educated Patient   Methods Explanation;Demonstration;Tactile cues;Verbal cues;Handout   Comprehension Verbalized understanding;Returned demonstration;Verbal cues required;Tactile cues required;Need further instruction          PT Short Term Goals - 09/21/16 1954      PT SHORT TERM GOAL #1   Title Patient demonstrates understanding of initial HEP. (Target Date: 10/20/2016)   Time 4   Period Weeks   Status New     PT SHORT TERM GOAL #2   Title Patient performs stand pivot transfer with AD with supervision.  (Target Date: 10/20/2016)   Time 4   Period Weeks   Status New     PT SHORT TERM GOAL #3   Title Patient demonstrates weight shift onto right LE in stance with cueing.  (Target Date: 10/20/2016)   Time 4   Period Weeks   Status New           PT Long Term Goals - 09/21/16 1948      PT LONG TERM GOAL #1   Title Patient and sister demonstrate understanding of ongoing HEP. (Target Date: 11/17/2016)   Time 8   Period Weeks   Status New     PT LONG TERM GOAL #2   Title Patient standing balance: looks over shoulders & up with UE support, manages clothes & reaches 5" without UE support modified independent.  (Target Date: 11/17/2016)   Time 8   Period Weeks   Status New     PT LONG TERM GOAL #3   Title Patient verbalizes recommendations for equipment to maximize independence and proper use. (Target Date: 11/17/2016)   Time 8   Period Weeks   Status New     PT LONG TERM GOAL #4   Title Patient ambulates 10' with AD modified independent. (Target Date: 11/17/2016)   Time 8   Period Weeks   Status New            Plan - 09/27/16 0808    Clinical Impression Statement today's skilled session focused on transfer training with hemiwalker and on establishing HEP for home. Pt has limited assistance at home, therefore ensured all exercises sent home were safe for pt to perform on his own.  Pt also now agreeable to getting a power wheelchair. Primary PT notifited and will set  up this appt. Pt has chosen [facility] as he has worked with them in the past. Pt is making steady progress toward goals and should benefit from continued PT to progress toward unmet goals.    Rehab Potential Good   Clinical Impairments Affecting Rehab Potential CVA with right hemiparesis 2008, chronic venous insufficiency, DM2, GERD, gout, HLD, HTN, morbid obesity, sleep apnea,   PT Frequency 2x / week   PT Duration 8 weeks   PT Treatment/Interventions ADLs/Self Care Home  Management;Moist Heat;Ultrasound;DME Instruction;Gait training;Stair training;Functional mobility training;Therapeutic activities;Therapeutic exercise;Balance training;Neuromuscular re-education;Patient/family education;Wheelchair mobility training   Consulted and Agree with Plan of Care Patient;Family member/caregiver   Family Member Consulted sister, Manuela Schwartz      Patient will benefit from skilled therapeutic intervention in order to improve the following deficits and impairments:  Abnormal gait, Decreased activity tolerance, Decreased balance, Decreased endurance, Decreased knowledge of precautions, Decreased knowledge of use of DME, Decreased mobility, Decreased safety awareness, Decreased strength, Impaired flexibility, Postural dysfunction, Pain  Visit Diagnosis: 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 Adjustment disorder with mixed anxiety and depressed mood   . Benign essential HTN   . Type 2 diabetes mellitus with peripheral neuropathy (HCC)   . Stage 3 chronic kidney disease   . Spastic hemiparesis of right dominant side ([place]) 09/08/2016  . Weakness   . Myoclonus   . Seizures ([place])   . TIA  (transient ischemic attack) 09/07/2016  . Chronic venous insufficiency   . History of cerebral infarction ([place]) 10/21/2010  . DM (diabetes mellitus) ([place]) 10/21/2010  . HLD (hyperlipidemia) 10/21/2010  . HTN (hypertension) 10/21/2010  . Sleep apnea 10/21/2010    Willow Ora, PTA, [facility] [address] [place], [place] [address] [phone] 09/27/16, 3:14 PM   Name: Eduardo Young MRN: [id number] Date of Birth: Mar 21, 1956

## 2016-09-28 ENCOUNTER — Ambulatory Visit (INDEPENDENT_AMBULATORY_CARE_PROVIDER_SITE_OTHER): Payer: Medicare Other | Admitting: Family Medicine

## 2016-09-28 ENCOUNTER — Encounter: Payer: Self-pay | Admitting: Family Medicine

## 2016-09-28 VITALS — BP 164/82 | HR 68 | Temp 97.4°F | Resp 22 | Wt 305.0 lb

## 2016-09-28 DIAGNOSIS — I10 Essential (primary) hypertension: Secondary | ICD-10-CM | POA: Diagnosis not present

## 2016-09-28 DIAGNOSIS — E119 Type 2 diabetes mellitus without complications: Secondary | ICD-10-CM | POA: Diagnosis not present

## 2016-09-28 DIAGNOSIS — Z09 Encounter for follow-up examination after completed treatment for conditions other than malignant neoplasm: Secondary | ICD-10-CM | POA: Diagnosis not present

## 2016-09-28 DIAGNOSIS — Z8673 Personal history of transient ischemic attack (TIA), and cerebral infarction without residual deficits: Secondary | ICD-10-CM | POA: Diagnosis not present

## 2016-09-28 DIAGNOSIS — IMO0001 Reserved for inherently not codable concepts without codable children: Secondary | ICD-10-CM

## 2016-09-28 DIAGNOSIS — Z794 Long term (current) use of insulin: Secondary | ICD-10-CM

## 2016-09-28 DIAGNOSIS — E7800 Pure hypercholesterolemia, unspecified: Secondary | ICD-10-CM | POA: Diagnosis not present

## 2016-09-28 LAB — CBC WITH DIFFERENTIAL/PLATELET
Basophils Absolute: 0 cells/uL (ref 0–200)
Basophils Relative: 0 %
Eosinophils Absolute: 680 cells/uL — ABNORMAL HIGH (ref 15–500)
Eosinophils Relative: 8 %
HCT: 36.5 % — ABNORMAL LOW (ref 38.5–50.0)
Hemoglobin: 12.1 g/dL — ABNORMAL LOW (ref 13.0–17.0)
Lymphocytes Relative: 18 %
Lymphs Abs: 1530 cells/uL (ref 850–3900)
MCH: 28.1 pg (ref 27.0–33.0)
MCHC: 33.2 g/dL (ref 32.0–36.0)
MCV: 84.9 fL (ref 80.0–100.0)
MPV: 9.2 fL (ref 7.5–12.5)
Monocytes Absolute: 425 cells/uL (ref 200–950)
Monocytes Relative: 5 %
Neutro Abs: 5865 cells/uL (ref 1500–7800)
Neutrophils Relative %: 69 %
Platelets: 233 10*3/uL (ref 140–400)
RBC: 4.3 MIL/uL (ref 4.20–5.80)
RDW: 15.8 % — ABNORMAL HIGH (ref 11.0–15.0)
WBC: 8.5 10*3/uL (ref 3.8–10.8)

## 2016-09-28 LAB — COMPLETE METABOLIC PANEL WITH GFR
ALT: 15 U/L (ref 9–46)
AST: 13 U/L (ref 10–35)
Albumin: 3.8 g/dL (ref 3.6–5.1)
Alkaline Phosphatase: 73 U/L (ref 40–115)
BUN: 24 mg/dL (ref 7–25)
CO2: 24 mmol/L (ref 20–31)
Calcium: 9 mg/dL (ref 8.6–10.3)
Chloride: 105 mmol/L (ref 98–110)
Creat: 1.56 mg/dL — ABNORMAL HIGH (ref 0.70–1.33)
GFR, Est African American: 56 mL/min — ABNORMAL LOW (ref >=60)
GFR, Est Non African American: 48 mL/min — ABNORMAL LOW (ref >=60)
Glucose, Bld: 107 mg/dL — ABNORMAL HIGH (ref 70–99)
Potassium: 3.8 mmol/L (ref 3.5–5.3)
Sodium: 142 mmol/L (ref 135–146)
Total Bilirubin: 0.5 mg/dL (ref 0.2–1.2)
Total Protein: 6.2 g/dL (ref 6.1–8.1)

## 2016-09-28 MED ORDER — LINAGLIPTIN 5 MG PO TABS: 5.0000 mg | ORAL_TABLET | Freq: Every day | ORAL | 5 refills | Status: DC

## 2016-09-28 NOTE — Progress Notes (Signed)
Subjective:    Patient ID: Eduardo Young, male    DOB: 12-08-1955, 61 y.o.   MRN: [id number]  Medication Refill    Unfortunately, the patient was recently admitted to the hospital. I have copied relevant portions of the discharge summary below and included them for my reference:  Admit date: 09/08/2016 Discharge date: 09/16/2016  Discharge Diagnoses:  Principal Problem:   Spastic hemiparesis of right dominant side ([place]) Active Problems:   HLD (hyperlipidemia)   Sleep apnea   Weakness   Myoclonus   Seizures (HCC)   Benign essential HTN   Type 2 diabetes mellitus with peripheral neuropathy ([place])   Stage 3 chronic kidney disease   Adjustment disorder with mixed anxiety and depressed mood   Discharged Condition: stable   Significant Diagnostic Studies: Dg Chest 2 View  Result Date: 09/08/2016 CLINICAL DATA:  Left-sided weakness EXAM: CHEST  2 VIEW COMPARISON:  03/24/2013 FINDINGS: Cardiac shadow is stable. The lungs are well aerated bilaterally. No focal infiltrate or sizable effusion is seen. No bony abnormality is noted. IMPRESSION: No acute abnormality seen. Electronically Signed   By: Inez Catalina M.D.   On: 09/08/2016 07:44   Ct Head Wo Contrast  Result Date: 09/07/2016 CLINICAL DATA:  Right leg weakness.  History of stroke. EXAM: CT HEAD WITHOUT CONTRAST TECHNIQUE: Contiguous axial images were obtained from the base of the skull through the vertex without intravenous contrast. 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 Slight ex vacuo dilatation of body of the left lateral ventricle. Chronic small vessel ischemic disease of periventricular white matter. No acute intracranial hemorrhage, midline shift or edema. No extra-axial fluid. Vascular: No hyperdense vessels or unexpected calcifications. Skull: Intact. Sinuses/Orbits: Clear mastoids. Moderate ethmoid and frontal sinus mucosal  thickening. Mild bilateral maxillary and ethmoid sinus mucosal thickening. Other: None IMPRESSION: 1. Chronic small vessel ischemic disease of periventricular white matter. 2. 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 3. No acute intracranial abnormality. 4. Chronic paranasal sinusitis. Electronically Signed   By: Ashley Royalty M.D.   On: 09/07/2016 21:11   Mr Brain Wo Contrast  Result Date: 09/08/2016 CLINICAL DATA:  Acute onset of weakness last night. EXAM: MRI HEAD WITHOUT CONTRAST TECHNIQUE: Multiplanar, multiecho pulse sequences of the brain and surrounding structures were obtained without intravenous contrast. COMPARISON:  Head CT 09/07/2016 FINDINGS: Brain: Diffusion imaging does not show any acute or subacute infarction. Old Wallerian degeneration of the brainstem on the left. The cerebellum is normal. Cerebral hemispheres show moderate chronic small-vessel ischemic changes of the deep white matter with an old lacunar infarction in the right basal ganglia and an extensive old infarction in the left thalamus, posterior basal ganglia and radiating white matter tracts with atrophy and gliosis. Hemosiderin deposition is present throughout that region. No evidence of neoplastic mass lesion, acute hemorrhage, hydrocephalus or extra-axial collection. There is ex vacuo enlargement of the left lateral ventricle. No pituitary mass. Vascular: Major vessels at the base of the brain show flow. Skull and upper cervical spine: Negative Sinuses and orbits: Mucosal inflammatory changes of the paranasal sinuses. Orbits negative. Other: None significant IMPRESSION: Old infarction in the left hemisphere affecting the thalamus, basal ganglia and deep white matter. Unchanged from previous studies. No acute finding. Electronically Signed   By: Nelson Chimes M.D.   On: 09/08/2016 06:40   Brief HPI:   Eduardo Young is a 61 y.o. 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 MRI with old infarct in left hemisphere affecting thalamus, basal gangli and deep white matter--unchanged. Patient with reports of intermittent uncontrolled movements of RLE followed by weakness. EEG without focal seizure activaity. Therapy evaluations done and patient noted to have limitations in mobility and self care tasks. CIR recommended for follow up therapy   Hospital Course: [name] was admitted to rehab 09/08/2016 for inpatient therapies to consist of PT and OT at least three hours five days a week. Past admission physiatrist, therapy team and rehab RN have worked together to provide customized collaborative inpatient rehab.  Klonopin was added to help manage spasticity and myoclonus. He has refused to use CPAP during his stay. Blood pressures were noted to be poorly controlled and catapres was increased to bid with better control. Acute on chronic renal failure has improved with hydration and k dur was added to supplement hypokalemia. His diabetes was monitored with ac/hs cbg checks and metformin was discontinued due to evidence of CKD with Scr 1.7. Po intake has been good and BS have been controlled on lantus 20 units bid. He has made good progress and is modified independent at wheelchair level.  He will continue to receive outpatient PT and OT at [facility] starting 09/21/16.    Rehab course: During patient's stay in rehab team conference was held to monitor patient's progress, set goals and discuss barriers to discharge. At admission, patient required min assist for basic self care tasks and mobility. He has had improvement in activity tolerance, balance, postural control, as well as ability to compensate for deficits.  He requires min assist to complete ADL tasks.  He is modified independent for transfers and is ambulating 165' at with supervision and use of hemi walker on the  left. He requires max assist to climb stairs.  Family education was completed with sister who will assist as needed after discharge.    Disposition: Home  Diet: Heart Healthy.   Special Instructions: 1. Needs supervision for safety.  2. Needs BMET rechecked in a week 1-2 week to monitor renal status and potassium levels.   09/28/16 Here today for follow-up. Reviewed the discharge summary shows that they discontinue metformin due to worsening renal function. Increased Lantus to 20 units twice a day to control his blood sugars.  However, now that the patient's back at home he is apparently back to taking Lantus 35 units once a day. They increased his clonidine to BID manage his blood pressures. Potassium was added for hypokalemia. Current medicine list was reconciled hospital discharge list. Since discharge from the hospital, patient has started back on metformin as his sister is giving him his medication and she states that she was not told to stop it. She independently put him back on basal or 35 units once a day. She independently swishes clonidine to once a day. His blood pressure today is extremely high. He is not checking his blood sugars. He denies any chest pain shortness of breath or dyspnea on exertion.   Past Medical History:  Diagnosis Date  . Chronic venous insufficiency   . Diabetes mellitus   . GERD (gastroesophageal reflux disease)   . Gout   . Hemiparesis ([place])   . Hyperlipidemia   . Hypertension   . Obesity   . Renal calculi   . Sleep apnea, obstructive   . Stroke [facility])  [id number]   Past Surgical History:  Procedure Laterality Date  . LITHOTRIPSY    . NASAL POLYP EXCISION     Current Outpatient Prescriptions on File Prior to Visit  Medication Sig Dispense Refill  . allopurinol (ZYLOPRIM) 100 MG tablet TAKE ONE TABLET BY MOUTH TWICE DAILY 60 tablet 11  . aspirin 81 MG tablet Take 81 mg by mouth every evening.     . clonazePAM (KLONOPIN) 0.5 MG tablet Take 1  tablet (0.5 mg total) by mouth 2 (two) times daily. 60 tablet 0  . cloNIDine (CATAPRES) 0.1 MG tablet Take 1 tablet (0.1 mg total) by mouth 2 (two) times daily. 60 tablet 0  . doxazosin (CARDURA) 4 MG tablet TAKE ONE TABLET BY MOUTH ONCE DAILY 30 tablet 11  . hydrALAZINE (APRESOLINE) 25 MG tablet Take 2 tablets (50 mg total) by mouth every 8 (eight) hours. 180 tablet 11  . hydrochlorothiazide (HYDRODIURIL) 25 MG tablet TAKE ONE TABLET BY MOUTH ONCE DAILY 30 tablet 11  . insulin glargine (LANTUS) 100 UNIT/ML injection Inject 20 Units into the skin 2 (two) times daily.    Marland Kitchen labetalol (NORMODYNE) 300 MG tablet TAKE ONE TABLET BY MOUTH TWICE DAILY 60 tablet 11  . lisinopril (PRINIVIL,ZESTRIL) 20 MG tablet TAKE ONE TABLET BY MOUTH TWICE DAILY 60 tablet 11  . Multiple Vitamin (MULTIVITAMIN WITH MINERALS) TABS tablet Take 1 tablet by mouth daily.    . potassium chloride SA (K-DUR,KLOR-CON) 20 MEQ tablet Take 1 tablet (20 mEq total) by mouth 3 (three) times daily. 90 tablet 0  . senna-docusate (SENOKOT-S) 8.6-50 MG tablet Take 2 tablets by mouth at bedtime. 60 tablet 0  . simvastatin (ZOCOR) 40 MG tablet Take 1 tablet (40 mg total) by mouth at bedtime. 30 tablet 0   No current facility-administered medications on file prior to visit.    No Known Allergies Social History   Social History  . Marital status: Single    Spouse name: N/A  . Number of children: N/A  . Years of education: N/A   Occupational History  . Not on file.   Social History Main Topics  . Smoking status: Former Research scientist (life sciences)  . Smokeless tobacco: Never Used  . Alcohol use Yes     Comment: Occasional  . Drug use: Yes    Types: Cocaine, Marijuana     Comment: Past Hx  . Sexual activity: Not on file   Other Topics Concern  . Not on file   Social History Narrative  . No narrative on file      Review of Systems  All other systems reviewed and are negative.      Objective:   Physical Exam  Constitutional: He appears  well-developed and well-nourished.  Cardiovascular: Normal rate, regular rhythm and normal heart sounds.   Pulmonary/Chest: Effort normal and breath sounds normal. No respiratory distress. He has no wheezes. He has no rales.  Abdominal: Soft. Bowel sounds are normal. He exhibits no distension. There is no tenderness. There is no rebound and no guarding.  Skin: No rash noted. No erythema.  Vitals reviewed. Sitting in wheelchair with chronic right hemiplegia.         Assessment & Plan:  Hospital discharge follow-up - Plan: CBC with Differential/Platelet, COMPLETE METABOLIC PANEL WITH GFR  Benign essential HTN - Plan: CBC with Differential/Platelet, COMPLETE METABOLIC PANEL WITH GFR  History of stroke - Plan: CBC with Differential/Platelet, COMPLETE METABOLIC PANEL WITH GFR  IDDM (insulin dependent diabetes mellitus) ([place]) - Plan: CBC with  Differential/Platelet, COMPLETE METABOLIC PANEL WITH GFR  Pure hypercholesterolemia - Plan: CBC with Differential/Platelet, COMPLETE METABOLIC PANEL WITH GFR  1) DC metformin 2) Replace with Tradjenta 5 mg a day 3) Continue basaglar 35 units once a day. 4) Increase clonidine 0.1 mg pobid and recheck BP in 1 week 5) Recheck CMP and CBC.    I believe the patient would be possible for Medicaid given that his monthly income is $1500 or less. I recommended that they inquire see him as qualified as it may help pay for some of his medication. Sister is adamant that he will not qualify and seems to be agitated by my suggestion. Therefore I will not pursue further. I recheck hemoglobin A1c in 3 months after switching metformin to Tradjenta.  We'll monitor his renal function and his potassium. We need to get better control of his blood pressure so he is going to call me with his blood pressures in one week after increasing the frequency of clonidine.

## 2016-09-29 ENCOUNTER — Encounter: Payer: Medicare Other | Attending: Physical Medicine & Rehabilitation | Admitting: Physical Medicine & Rehabilitation

## 2016-10-03 ENCOUNTER — Ambulatory Visit: Payer: Medicare Other | Admitting: Occupational Therapy

## 2016-10-05 ENCOUNTER — Ambulatory Visit: Payer: Medicare Other | Admitting: Physical Therapy

## 2016-10-05 ENCOUNTER — Encounter: Payer: Self-pay | Admitting: Occupational Therapy

## 2016-10-05 ENCOUNTER — Encounter: Payer: Self-pay | Admitting: Physical Therapy

## 2016-10-05 ENCOUNTER — Ambulatory Visit: Payer: Medicare Other | Admitting: Occupational Therapy

## 2016-10-05 DIAGNOSIS — M6281 Muscle weakness (generalized): Secondary | ICD-10-CM

## 2016-10-05 DIAGNOSIS — I679 Cerebrovascular disease, unspecified: Secondary | ICD-10-CM

## 2016-10-05 DIAGNOSIS — M79641 Pain in right hand: Secondary | ICD-10-CM

## 2016-10-05 DIAGNOSIS — G8929 Other chronic pain: Secondary | ICD-10-CM | POA: Diagnosis not present

## 2016-10-05 DIAGNOSIS — R2689 Other abnormalities of gait and mobility: Secondary | ICD-10-CM | POA: Diagnosis not present

## 2016-10-05 DIAGNOSIS — R2681 Unsteadiness on feet: Secondary | ICD-10-CM

## 2016-10-05 DIAGNOSIS — G8113 Spastic hemiplegia affecting right nondominant side: Secondary | ICD-10-CM

## 2016-10-05 DIAGNOSIS — M6249 Contracture of muscle, multiple sites: Secondary | ICD-10-CM

## 2016-10-05 DIAGNOSIS — R293 Abnormal posture: Secondary | ICD-10-CM

## 2016-10-05 DIAGNOSIS — R208 Other disturbances of skin sensation: Secondary | ICD-10-CM

## 2016-10-05 DIAGNOSIS — I69353 Hemiplegia and hemiparesis following cerebral infarction affecting right non-dominant side: Secondary | ICD-10-CM | POA: Diagnosis not present

## 2016-10-05 DIAGNOSIS — M545 Low back pain: Secondary | ICD-10-CM | POA: Diagnosis not present

## 2016-10-05 NOTE — Therapy (Signed)
[facility] [address] [place], [place], [address] Phone: [phone]   Fax:  [phone]  Occupational Therapy Treatment  Patient Details  Name: Eduardo Young MRN: [id number] Date of Birth: 02-01-1956 Referring Provider: Dr. Naaman Plummer  Encounter Date: 10/05/2016      OT End of Session - 10/05/16 1427    Visit Number 3   Number of Visits 16   Date for OT Re-Evaluation 11/16/16   Authorization Type medicare will need G code and PN every 10th visit   Authorization Time Period 60 days   Authorization - Visit Number 3   Authorization - Number of Visits 10   OT Start Time 1150   OT Stop Time 1232   OT Time Calculation (min) 42 min   Activity Tolerance Patient tolerated treatment well      Past Medical History:  Diagnosis Date  . Chronic venous insufficiency   . Diabetes mellitus   . GERD (gastroesophageal reflux disease)   . Gout   . Hemiparesis ([place])   . Hyperlipidemia   . Hypertension   . Obesity   . Renal calculi   . Sleep apnea, obstructive   . Stroke [facility])    [id number]    Past Surgical History:  Procedure Laterality Date  . LITHOTRIPSY    . NASAL POLYP EXCISION      There were no vitals filed for this visit.      Subjective Assessment - 10/05/16 1158    Subjective  I kept my hand up and I think it helped   Pertinent History see epic. Pt with initial stroke in 2008, recently admitted to hospital thru ED on 09/07/2016 pt experiencing increased weakness in RLE - CT negative, overall pt has been declining over past year.   Patient Stated Goals to move around better    Pain Score 7    Pain Location Leg   Pain Orientation Left;Right   Pain Descriptors / Indicators Throbbing   Pain Type Chronic pain   Pain Onset In the past 7 days   Pain Frequency Intermittent   Aggravating Factors  standing, walk    Pain Relieving Factors sitting down or laying down                      OT  Treatments/Exercises (OP) - 10/05/16 0001      Splinting   Splinting Began fabrication of resting hand splint for pt to wear at night as pt presents with increased flexor tone in R hand and is at risk for developing contracture, even more increased tone and pain in hand.  Will complete next session.      Manual Therapy   Manual Therapy Edema management   Edema Management Pt with significant improvement in edema in R hand today - pt reports that he has consistently used the arm trough in the wheelchair and is completing edema massage 2-3 times daily.  Pt also states he and his sister had difficulty getting Isotoner glove on - however given improvement have d/c the glove.  Performed edema massage to address remaining fluid in hand followed by PROM of fingers with good results. Pt has no pain in hand today.                  OT Short Term Goals - 10/05/16 1425      OT SHORT TERM GOAL #1   Title Pt and sister will be mod I with HEP - 10/19/2016  Status On-going     OT SHORT TERM GOAL #2   Title Pt and sister will be able to identify 2 strategies for normalizing tone   Status On-going     OT SHORT TERM GOAL #3   Title Pt and sister will be mod I with splint wear and care   Status On-going     OT SHORT TERM GOAL #4   Title Pt and sister will be mod I for UE positioing in wheelchair and in bed   Status Achieved           OT Long Term Goals - 10/05/16 1425      OT LONG TERM GOAL #1   Title Pt and sister will be mod I with ugraded HEP PRN- 11/16/2016   Status On-going     OT LONG TERM GOAL #2   Title Pt and sister will be mod I with edema control techniques for RUE   Status On-going     OT LONG TERM GOAL #3   Title Pt will demonstrate adequate balance for safety and increased ease with basic transfers in the home (toilet and bed)   Status On-going     OT LONG TERM GOAL #4   Title Pt will be able to demostrate adequate standing balance without UE to enage in simple ADL  activities.    Status On-going               Plan - 10/05/16 1425    Clinical Impression Statement Pt progressing toward goals. Pt with much improvement in edema in  R hand.   Rehab Potential Fair   Clinical Impairments Affecting Rehab Potential severity of deficits, length of time since onset   OT Frequency 2x / week   OT Duration 8 weeks   OT Treatment/Interventions Self-care/ADL training;Therapeutic exercise;Neuromuscular education;DME and/or AE instruction;Passive range of motion;Manual Therapy;Functional Mobility Training;Splinting;Therapeutic activities;Patient/family education;Balance training   Plan complete splint, instructions for splint, NMR for RUE/trunk, functional mobility,    Consulted and Agree with Plan of Care Patient      Patient will benefit from skilled therapeutic intervention in order to improve the following deficits and impairments:  Abnormal gait, Decreased activity tolerance, Decreased balance, Decreased cognition, Decreased mobility, Decreased strength, Difficulty walking, Impaired UE functional use, Impaired tone, Impaired sensation, Pain  Visit Diagnosis: 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 Adjustment disorder with mixed anxiety and depressed mood   . Benign essential HTN   . Type 2 diabetes mellitus with peripheral neuropathy (HCC)   . Stage 3 chronic kidney disease   . Spastic hemiparesis of right dominant side ([place]) 09/08/2016  . Weakness   . Myoclonus   . Seizures ([place])   . TIA (transient ischemic attack) 09/07/2016  . Chronic venous insufficiency   . History of cerebral infarction ([place]) 10/21/2010  . DM (diabetes mellitus) ([place]) 10/21/2010  .  HLD (hyperlipidemia) 10/21/2010  . HTN (hypertension) 10/21/2010  . Sleep apnea 10/21/2010    [name], OTR/L 10/05/2016, 2:29 PM  [facility] [address] [place], [place], [address] Phone: [phone]   Fax:  [phone]  Name: Eduardo Young MRN: [id number] Date of Birth: 02-12-1956

## 2016-10-06 NOTE — Therapy (Signed)
[facility] [address] [place], [place], [address] Phone: [phone]   Fax:  [phone]  Physical Therapy Treatment  Patient Details  Name: Eduardo Young MRN: [id number] Date of Birth: Jan 17, 1956 Referring Provider: Alger Simons, MD  Encounter Date: 10/05/2016      PT End of Session - 10/05/16 1354    Visit Number 3   Number of Visits 17   Date for PT Re-Evaluation 11/20/16   Authorization Type Medicare G-code & progress note every 10th visit   PT Start Time 1105   PT Stop Time 1145   PT Time Calculation (min) 40 min   Equipment Utilized During Treatment Gait belt   Activity Tolerance Patient limited by fatigue;Patient tolerated treatment well;Patient limited by pain   Behavior During Therapy [facility] for tasks assessed/performed;Anxious      Past Medical History:  Diagnosis Date  . Chronic venous insufficiency   . Diabetes mellitus   . GERD (gastroesophageal reflux disease)   . Gout   . Hemiparesis ([place])   . Hyperlipidemia   . Hypertension   . Obesity   . Renal calculi   . Sleep apnea, obstructive   . Stroke [facility])    [id number]    Past Surgical History:  Procedure Laterality Date  . LITHOTRIPSY    . NASAL POLYP EXCISION      There were no vitals filed for this visit.      Subjective Assessment - 10/05/16 1109    Subjective He wants to procede with power wheelchair.      Pertinent History CVA with right hemiparesis 2008, chronic venous insufficiency, DM2, GERD, gout, HLD, HTN, morbid obesity, sleep apnea,    Limitations Standing;Walking;House hold activities   Patient Stated Goals To improve activity level to what he is capable of doing.    Currently in Pain? No/denies      PT attempted to assess and work on standing balance & limited gait with RW with OT assisting for RUE on RW & trunk elongation / balance reaction. Pt stood 3 times but was so anxious that he was unable to put weight on RLE  or RUE and his tone increased with anxiety.  PT worked on standing balance with hemiwalker LUE and counter near right side to create sense of security. Weight shifts forward and right but unable to shift enough to fully load RLE.                              PT Short Term Goals - 10/05/16 1354      PT SHORT TERM GOAL #1   Title Patient demonstrates understanding of initial HEP. (Target Date: 10/20/2016)   Time 4   Period Weeks   Status On-going     PT SHORT TERM GOAL #2   Title Patient performs stand pivot transfer with AD with supervision.  (Target Date: 10/20/2016)   Time 4   Period Weeks   Status On-going     PT SHORT TERM GOAL #3   Title Patient demonstrates weight shift onto right LE in stance with cueing.  (Target Date: 10/20/2016)   Time 4   Period Weeks   Status On-going           PT Long Term Goals - 10/05/16 1354      PT LONG TERM GOAL #1   Title Patient and sister demonstrate understanding of ongoing HEP. (Target Date: 11/17/2016)   Time  8   Period Weeks   Status On-going     PT LONG TERM GOAL #2   Title Patient standing balance: looks over shoulders & up with UE support, manages clothes & reaches 5" without UE support modified independent.  (Target Date: 11/17/2016)   Time 8   Period Weeks   Status On-going     PT LONG TERM GOAL #3   Title Patient verbalizes recommendations for equipment to maximize independence and proper use. (Target Date: 11/17/2016)   Time 8   Period Weeks   Status On-going     PT LONG TERM GOAL #4   Title Patient ambulates 10' with AD modified independent. (Target Date: 11/17/2016)   Time 8   Period Weeks   Status On-going               Plan - 10/05/16 1354    Clinical Impression Statement Patient was fearful of standing and taking steps with new device of RW instead of hemiwalker. Patient's right ankle appears has adequate joint motion with knee flexed to come closer to 90* position than knee straight  or hyperextended which is posture in standing.    Rehab Potential Good   Clinical Impairments Affecting Rehab Potential CVA with right hemiparesis 2008, chronic venous insufficiency, DM2, GERD, gout, HLD, HTN, morbid obesity, sleep apnea,   PT Frequency 2x / week   PT Duration 8 weeks   PT Treatment/Interventions ADLs/Self Care Home Management;Moist Heat;Ultrasound;DME Instruction;Gait training;Stair training;Functional mobility training;Therapeutic activities;Therapeutic exercise;Balance training;Neuromuscular re-education;Patient/family education;Wheelchair mobility training   PT Next Visit Plan work on standing balance and weight shifts.    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 Adjustment disorder with mixed anxiety and depressed mood   . Benign essential HTN   . Type 2 diabetes mellitus with peripheral neuropathy (HCC)   . Stage 3 chronic kidney disease   . Spastic hemiparesis of right dominant side ([place]) 09/08/2016  . Weakness   . Myoclonus   . Seizures ([place])   . TIA (transient ischemic attack) 09/07/2016  . Chronic venous insufficiency   . History of cerebral infarction ([place]) 10/21/2010  . DM (diabetes mellitus)  ([place]) 10/21/2010  . HLD (hyperlipidemia) 10/21/2010  . HTN (hypertension) 10/21/2010  . Sleep apnea 10/21/2010    Jamey Reas PT, DPT 10/06/2016, 1:57 PM  [facility] [address] [place], [place], [address] Phone: [phone]   Fax:  [phone]  Name: Eduardo Young MRN: [id number] Date of Birth: 27-Aug-1955

## 2016-10-09 ENCOUNTER — Ambulatory Visit: Payer: Medicare Other | Admitting: Physical Therapy

## 2016-10-09 ENCOUNTER — Ambulatory Visit: Payer: Medicare Other | Admitting: Occupational Therapy

## 2016-10-09 ENCOUNTER — Encounter: Payer: Self-pay | Admitting: Physical Therapy

## 2016-10-09 ENCOUNTER — Telehealth: Payer: Self-pay | Admitting: Physical Therapy

## 2016-10-09 ENCOUNTER — Encounter: Payer: Self-pay | Admitting: Occupational Therapy

## 2016-10-09 DIAGNOSIS — G8929 Other chronic pain: Secondary | ICD-10-CM

## 2016-10-09 DIAGNOSIS — M79641 Pain in right hand: Secondary | ICD-10-CM

## 2016-10-09 DIAGNOSIS — R293 Abnormal posture: Secondary | ICD-10-CM

## 2016-10-09 DIAGNOSIS — I6990 Unspecified sequelae of unspecified cerebrovascular disease: Secondary | ICD-10-CM

## 2016-10-09 DIAGNOSIS — R2689 Other abnormalities of gait and mobility: Secondary | ICD-10-CM

## 2016-10-09 DIAGNOSIS — M6281 Muscle weakness (generalized): Secondary | ICD-10-CM

## 2016-10-09 DIAGNOSIS — M545 Low back pain, unspecified: Secondary | ICD-10-CM

## 2016-10-09 DIAGNOSIS — R2681 Unsteadiness on feet: Secondary | ICD-10-CM

## 2016-10-09 DIAGNOSIS — G8113 Spastic hemiplegia affecting right nondominant side: Secondary | ICD-10-CM

## 2016-10-09 DIAGNOSIS — R208 Other disturbances of skin sensation: Secondary | ICD-10-CM

## 2016-10-09 DIAGNOSIS — I679 Cerebrovascular disease, unspecified: Principal | ICD-10-CM

## 2016-10-09 DIAGNOSIS — I69353 Hemiplegia and hemiparesis following cerebral infarction affecting right non-dominant side: Secondary | ICD-10-CM | POA: Diagnosis not present

## 2016-10-09 DIAGNOSIS — M6249 Contracture of muscle, multiple sites: Secondary | ICD-10-CM

## 2016-10-09 NOTE — Telephone Encounter (Signed)
Jayant Kriz would benefit from a power wheelchair and appears would qualify. Will you please write an order in EPIC for power wheelchair and PT assessment? He will need a face to face with you to only discuss power wheelchair per Medicare requirement. Mr. Baumgartner has chosen [employer] who will contact your office for appointment for face to face.  Please contact me via EPIC, email or phone with questions. Thank you Jamey Reas, PT, DPT PT Specializing in [place] @3 /19/2018@ 11:41 AM  [email or web address] Phone:  [phone]  Fax:  [phone] [facility] [address] [place], [place] [address]

## 2016-10-09 NOTE — Therapy (Signed)
[facility] [address] [place], [place], [address] Phone: [phone]   Fax:  [phone]  Physical Therapy Treatment  Patient Details  Name: Eduardo Young MRN: [id number] Date of Birth: January 12, 1956 Referring Provider: Alger Simons, MD  Encounter Date: 10/09/2016      PT End of Session - 10/09/16 1335    Visit Number 4   Number of Visits 17   Date for PT Re-Evaluation 11/20/16   Authorization Type Medicare G-code & progress note every 10th visit   PT Start Time 1230   PT Stop Time 1315   PT Time Calculation (min) 45 min   Activity Tolerance Patient tolerated treatment well   Behavior During Therapy [facility] for tasks assessed/performed      Past Medical History:  Diagnosis Date  . Chronic venous insufficiency   . Diabetes mellitus   . GERD (gastroesophageal reflux disease)   . Gout   . Hemiparesis ([place])   . Hyperlipidemia   . Hypertension   . Obesity   . Renal calculi   . Sleep apnea, obstructive   . Stroke [facility])    [id number]    Past Surgical History:  Procedure Laterality Date  . LITHOTRIPSY    . NASAL POLYP EXCISION      There were no vitals filed for this visit.      Subjective Assessment - 10/09/16 1325    Subjective No issues since last session; ready to begin process to obtain power w/c.   Patient is accompained by: Family member   Pertinent History CVA with right hemiparesis 2008, chronic venous insufficiency, DM2, GERD, gout, HLD, HTN, morbid obesity, sleep apnea,    Limitations Standing;Walking;House hold activities   Patient Stated Goals To improve activity level to what he is capable of doing.    Currently in Pain? Yes   Pain Score 6    Pain Location Leg   Pain Orientation Right   Pain Descriptors / Indicators Throbbing   Pain Type Chronic pain;Neuropathic pain   Pain Onset 1 to 4 weeks ago           [facility] Adult PT Treatment/Exercise - 10/09/16 1326      Wheelchair Mobility    Wheelchair Mobility Yes   Comments Patient participated in partial power w/c evaluation with [employer], [employer] and Jamey Reas, PT.  Pt's sister also present to discuss seating options.  Will continue and complete evaluation on Thursday.  Pt may also benefit from power w/c trial to determine if power tilt vs. power recline will be most appropriate for pressure relief and positioning.             PT Education - 10/09/16 1335    Education provided Yes   Education Details power w/c options for seating, positioning and pressure relief   Person(s) Educated Patient;Caregiver(s)   Methods Explanation   Comprehension Need further instruction          PT Short Term Goals - 10/05/16 1354      PT SHORT TERM GOAL #1   Title Patient demonstrates understanding of initial HEP. (Target Date: 10/20/2016)   Time 4   Period Weeks   Status On-going     PT SHORT TERM GOAL #2   Title Patient performs stand pivot transfer with AD with supervision.  (Target Date: 10/20/2016)   Time 4   Period Weeks   Status On-going     PT SHORT TERM GOAL #3   Title Patient  demonstrates weight shift onto right LE in stance with cueing.  (Target Date: 10/20/2016)   Time 4   Period Weeks   Status On-going           PT Long Term Goals - 10/05/16 1354      PT LONG TERM GOAL #1   Title Patient and sister demonstrate understanding of ongoing HEP. (Target Date: 11/17/2016)   Time 8   Period Weeks   Status On-going     PT LONG TERM GOAL #2   Title Patient standing balance: looks over shoulders & up with UE support, manages clothes & reaches 5" without UE support modified independent.  (Target Date: 11/17/2016)   Time 8   Period Weeks   Status On-going     PT LONG TERM GOAL #3   Title Patient verbalizes recommendations for equipment to maximize independence and proper use. (Target Date: 11/17/2016)   Time 8   Period Weeks   Status On-going     PT LONG TERM GOAL #4   Title Patient  ambulates 10' with AD modified independent. (Target Date: 11/17/2016)   Time 8   Period Weeks   Status On-going           Plan - 10/09/16 1336    Clinical Impression Statement Pt participated in initial part of power w/c evaluation; will complete evaluation-assessment of strength/ROM and discuss seating options at next visit.  [employer] willing to let pt trial power w/c features to determine most appropriate power seat options tilt vs. recline.  Please see completed LMN at end of next session note.   Clinical Impairments Affecting Rehab Potential CVA with right hemiparesis 2008, chronic venous insufficiency, DM2, GERD, gout, HLD, HTN, morbid obesity, sleep apnea,   PT Treatment/Interventions ADLs/Self Care Home Management;Moist Heat;Ultrasound;DME Instruction;Gait training;Stair training;Functional mobility training;Therapeutic activities;Therapeutic exercise;Balance training;Neuromuscular re-education;Patient/family education;Wheelchair mobility training   PT Next Visit Plan continue power w/c evaluation   Consulted and Agree with Plan of Care Patient   Family Member Consulted sister, Manuela Schwartz      Patient will benefit from skilled therapeutic intervention in order to improve the following deficits and impairments:  Abnormal gait, Decreased activity tolerance, Decreased balance, Decreased endurance, Decreased knowledge of precautions, Decreased knowledge of use of DME, Decreased mobility, Decreased safety awareness, Decreased strength, Impaired flexibility, Postural dysfunction, Pain  Visit Diagnosis: 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 Adjustment disorder with mixed anxiety and depressed mood   . Benign essential HTN   . Type 2 diabetes mellitus with peripheral neuropathy (HCC)   . Stage 3 chronic kidney disease   . Spastic hemiparesis of right dominant side ([place]) 09/08/2016  . Weakness   . Myoclonus   . Seizures ([place])   . TIA (transient ischemic attack) 09/07/2016  . Chronic venous insufficiency   . History of cerebral infarction ([place]) 10/21/2010  . DM (diabetes mellitus) ([place]) 10/21/2010  . HLD (hyperlipidemia) 10/21/2010  . HTN (hypertension) 10/21/2010  . Sleep apnea 10/21/2010    Raylene Everts, PT, DPT 10/09/16    1:40 PM   [facility] [address] [place], [place], [address] Phone: [phone]   Fax:  [place]  Name: BRYANT SAYE MRN: [id number] Date of Birth: 1955-08-14

## 2016-10-09 NOTE — Patient Instructions (Signed)
Your Splint This splint should initially be fitted by a healthcare practitioner.  The healthcare practitioner is responsible for providing wearing instructions and precautions to the patient, other healthcare practitioners and care provider involved in the patient's care.  This splint was custom made for you. Please read the following instructions to learn about wearing and caring for your splint.  Precautions Should your splint cause any of the following problems, remove the splint immediately and contact your therapist/physician.  Swelling  Severe Pain  Pressure Areas  Stiffness  Numbness  Do not wear your splint while operating machinery unless it has been fabricated for that purpose.  When To Wear Your Splint Where your splint according to your therapist/physician instructions.  Day One:  Tuesday You will wear it during the day - 2 hours on in the morning and 2 hours on in the late afternoon/evening.  Make sure it is off at least 2 hours between the two times Day Two:  Wednesday  If  there were no issues Tuesday wear it 4 hours in the morning and 4 hours in the late afternoon/evening.  Make sure it is off at least 2 hours between the two times Day Three:  Thursday  If there were no problems Wednesday wear it all night while you sleep.  DO NOT WEAR IT DURING THE DAY ANYMORE. Once you start wearing it at night you only wear it at night.  Care and Cleaning of Your Splint 1. Keep your splint away from open flames. 2. Your splint will lose its shape in temperatures over 135 degrees Farenheit, ( in car windows, near radiators, ovens or in hot water).  Never make any adjustments to your splint, if the splint needs adjusting remove it and make an appointment to see your therapist. 3. Your splint, including the cushion liner may be cleaned with soap and lukewarm water.  Do not immerse in hot water over 135 degrees Farenheit. 4. Straps may be washed with soap and water, but do not moisten the  self-adhesive portion. 5. For ink or hard to remove spots use a scouring cleanser which contains chlorine.  Rinse the splint thoroughly after using chlorine cleanser.

## 2016-10-09 NOTE — Telephone Encounter (Signed)
Dr. Serita Grit patient

## 2016-10-09 NOTE — Therapy (Signed)
[facility] [address] [place], [place], [address] Phone: [phone]   Fax:  [phone]  Occupational Therapy Treatment  Patient Details  Name: Eduardo Young MRN: [id number] Date of Birth: Dec 12, 1955 Referring Provider: Dr. Naaman Plummer  Encounter Date: 10/09/2016      OT End of Session - 10/09/16 1554    Visit Number 4   Number of Visits 16   Date for OT Re-Evaluation 11/16/16   Authorization Type medicare will need G code and PN every 10th visit   Authorization Time Period 60 days   Authorization - Visit Number 4   Authorization - Number of Visits 10   OT Start Time 1320   OT Stop Time 1400   OT Time Calculation (min) 40 min      Past Medical History:  Diagnosis Date  . Chronic venous insufficiency   . Diabetes mellitus   . GERD (gastroesophageal reflux disease)   . Gout   . Hemiparesis ([place])   . Hyperlipidemia   . Hypertension   . Obesity   . Renal calculi   . Sleep apnea, obstructive   . Stroke [facility])    [id number]    Past Surgical History:  Procedure Laterality Date  . LITHOTRIPSY    . NASAL POLYP EXCISION      There were no vitals filed for this visit.      Subjective Assessment - 10/09/16 1319    Subjective  the swelling in my hand is so much better   Patient is accompained by: Family member  sister   Pertinent History see epic. Pt with initial stroke in 2008, recently admitted to hospital thru ED on 09/07/2016 pt experiencing increased weakness in RLE - CT negative, overall pt has been declining over past year.   Patient Stated Goals to move around better    Currently in Pain? Yes   Pain Score 6    Pain Location Leg   Pain Orientation Right   Pain Descriptors / Indicators Throbbing   Pain Type Chronic pain   Pain Onset 1 to 4 weeks ago   Pain Frequency Intermittent   Aggravating Factors  I figured out it is from the leg resting against the wheelchair   Pain Relieving Factors sitting  in other chair or laying down                      OT Treatments/Exercises (OP) - 10/09/16 0001      Splinting   Splinting completed fabrication of resting hand splint;  pt practiced donning and doffing of splint.  After repetitions of practice pt mod I with donnig/doffing.  Reviewed wearing schedule as well as things to monitor with pt and sister while pt builds up tolerance to splint.  Also reviewed care of splint. Pt and sister able to verbalize understanding as well as pt to return demonstrate donning/doffing.                 OT Education - 10/09/16 1552    Education provided Yes   Education Details splint wear and care   Person(s) Educated Patient;Caregiver(s)  sister   Methods Explanation;Demonstration;Handout;Verbal cues   Comprehension Verbalized understanding;Returned demonstration          OT Short Term Goals - 10/09/16 1552      OT SHORT TERM GOAL #1   Title Pt and sister will be mod I with HEP - 10/19/2016   Status On-going     OT  SHORT TERM GOAL #2   Title Pt and sister will be able to identify 2 strategies for normalizing tone   Status On-going     OT SHORT TERM GOAL #3   Title Pt and sister will be mod I with splint wear and care   Status On-going     OT SHORT TERM GOAL #4   Title Pt and sister will be mod I for UE positioing in wheelchair and in bed   Status Achieved           OT Long Term Goals - 10/09/16 1552      OT LONG TERM GOAL #1   Title Pt and sister will be mod I with ugraded HEP PRN- 11/16/2016   Status On-going     OT LONG TERM GOAL #2   Title Pt and sister will be mod I with edema control techniques for RUE   Status On-going     OT LONG TERM GOAL #3   Title Pt will demonstrate adequate balance for safety and increased ease with basic transfers in the home (toilet and bed)   Status On-going     OT LONG TERM GOAL #4   Title Pt will be able to demostrate adequate standing balance without UE to enage in simple  ADL activities.    Status On-going               Plan - 10/09/16 1553    Clinical Impression Statement Pt progressing toward goals. Pt has been compliant with recommendations for edema mgmt with excellent results.   Rehab Potential Fair   Clinical Impairments Affecting Rehab Potential severity of deficits, length of time since onset   OT Frequency 2x / week   OT Duration 8 weeks   OT Treatment/Interventions Self-care/ADL training;Therapeutic exercise;Neuromuscular education;DME and/or AE instruction;Passive range of motion;Manual Therapy;Functional Mobility Training;Splinting;Therapeutic activities;Patient/family education;Balance training   Plan check splint, check remaining STG"s, NMR for RUE/trunk, functional mobility   Consulted and Agree with Plan of Care Patient   Family Member Consulted sister      Patient will benefit from skilled therapeutic intervention in order to improve the following deficits and impairments:  Abnormal gait, Decreased activity tolerance, Decreased balance, Decreased cognition, Decreased mobility, Decreased strength, Difficulty walking, Impaired UE functional use, Impaired tone, Impaired sensation, Pain  Visit Diagnosis: 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 Adjustment disorder with mixed anxiety and depressed mood   . Benign essential HTN   . Type 2 diabetes mellitus with peripheral neuropathy (HCC)   . Stage 3 chronic kidney disease   . Spastic hemiparesis of right dominant side ([place]) 09/08/2016  . Weakness   . Myoclonus   . Seizures ([place])   . TIA (transient ischemic attack) 09/07/2016  . Chronic venous  insufficiency   . History of cerebral infarction ([place]) 10/21/2010  . DM (diabetes mellitus) ([place]) 10/21/2010  . HLD (hyperlipidemia) 10/21/2010  . HTN (hypertension) 10/21/2010  . Sleep apnea 10/21/2010    [name], OTR/L 10/09/2016, 3:57 PM  [facility] [address] [place], [place], [address] Phone: [phone]   Fax:  [phone]  Name: Eduardo Young MRN: [id number] Date of  Birth: 08-Apr-1956

## 2016-10-12 ENCOUNTER — Encounter: Payer: Self-pay | Admitting: Occupational Therapy

## 2016-10-12 ENCOUNTER — Encounter: Payer: Self-pay | Admitting: Physical Therapy

## 2016-10-12 ENCOUNTER — Ambulatory Visit: Payer: Medicare Other | Admitting: Physical Therapy

## 2016-10-12 ENCOUNTER — Ambulatory Visit: Payer: Medicare Other | Admitting: Occupational Therapy

## 2016-10-12 DIAGNOSIS — M545 Low back pain: Secondary | ICD-10-CM

## 2016-10-12 DIAGNOSIS — G8113 Spastic hemiplegia affecting right nondominant side: Secondary | ICD-10-CM

## 2016-10-12 DIAGNOSIS — M6281 Muscle weakness (generalized): Secondary | ICD-10-CM

## 2016-10-12 DIAGNOSIS — R2689 Other abnormalities of gait and mobility: Secondary | ICD-10-CM | POA: Diagnosis not present

## 2016-10-12 DIAGNOSIS — M79641 Pain in right hand: Secondary | ICD-10-CM

## 2016-10-12 DIAGNOSIS — R208 Other disturbances of skin sensation: Secondary | ICD-10-CM

## 2016-10-12 DIAGNOSIS — R2681 Unsteadiness on feet: Secondary | ICD-10-CM

## 2016-10-12 DIAGNOSIS — G8929 Other chronic pain: Secondary | ICD-10-CM | POA: Diagnosis not present

## 2016-10-12 DIAGNOSIS — I679 Cerebrovascular disease, unspecified: Principal | ICD-10-CM

## 2016-10-12 DIAGNOSIS — M6249 Contracture of muscle, multiple sites: Secondary | ICD-10-CM | POA: Diagnosis not present

## 2016-10-12 DIAGNOSIS — I69353 Hemiplegia and hemiparesis following cerebral infarction affecting right non-dominant side: Secondary | ICD-10-CM | POA: Diagnosis not present

## 2016-10-12 DIAGNOSIS — R293 Abnormal posture: Secondary | ICD-10-CM

## 2016-10-12 NOTE — Therapy (Addendum)
[facility] [address] [place], [place], [address] Phone: [phone]   Fax:  [phone]  Physical Therapy Treatment  Patient Details  Name: Eduardo Young MRN: [id number] Date of Birth: [birth year] Referring Provider: Alger Simons, MD  Encounter Date: 10/12/2016      PT End of Session - 10/12/16 1412    Visit Number 5   Number of Visits 17   Date for PT Re-Evaluation 11/20/16   Authorization Type Medicare G-code & progress note every 10th visit   PT Start Time 1300   PT Stop Time 1406   PT Time Calculation (min) 66 min   Activity Tolerance Patient tolerated treatment well   Behavior During Therapy [facility] for tasks assessed/performed      Past Medical History:  Diagnosis Date  . Chronic venous insufficiency   . Diabetes mellitus   . GERD (gastroesophageal reflux disease)   . Gout   . Hemiparesis ([place])   . Hyperlipidemia   . Hypertension   . Obesity   . Renal calculi   . Sleep apnea, obstructive   . Stroke [facility])    [id number]    Past Surgical History:  Procedure Laterality Date  . LITHOTRIPSY    . NASAL POLYP EXCISION      There were no vitals filed for this visit.      Subjective Assessment - 10/12/16 1650    Subjective Pt returning for the rest of power w/c evaluation; pt stating-"I just want a simple chair."  Sister-Susan present.   Patient is accompained by: Family member   Pertinent History CVA with right hemiparesis 2008, chronic venous insufficiency, DM2, GERD, gout, HLD, HTN, morbid obesity, sleep apnea,    Limitations Standing;Walking;House hold activities   Patient Stated Goals To improve activity level to what he is capable of doing.    Currently in Pain? No/denies           [facility] Adult PT Treatment/Exercise - 10/12/16 1651      Transfers   Transfers Squat Pivot Transfers   Squat Pivot Transfers 4: Min assist;3: Mod assist   Squat Pivot Transfer Details (indicate cue type and  reason) Performed multiple transfers manual w/c > mat <> power w/c and mat > manual w/c on unlevel surfaces with assistance to maintain R foot placement and assistance to keep COG forwards over BOS to allow pt to lift and clear hips over edge of Sports coach Yes   Wheelchair Assistance 5: Psychologist, counselling cues for Armed forces technical officer Power   Wheelchair Parts Management Needs assistance   Distance 115 with verbal cues for safe negotiation around people/obstacles and for set up beside mat for transfers   Comments Patient continued in power w/c evaluation with focus on trial of power w/c and power seating options and discussion with pt and sister regarding recommendations and financial barriers.  Also completed ROM and strength assessment.       Mobility/Seating Evaluation    PATIENT INFORMATION: Name: Eduardo Young DOB: 1956/05/04  Sex: M Date seen: 10/09/2016 Time: 12:30  Address:  [address] [place] [address]  Physician: Delice Lesch, MD This evaluation/justification form will serve as the LMN for the following suppliers: __________________________ Supplier: [employer] Contact Person: Luz Brazen, ATP Phone:  [phone]   Seating Therapist: Jamey Reas, PT Raylene Everts, PT Phone:   [phone]   Phone: [phone] (Home)  Spouse/Parent/Caregiver name: Sister: Susan Krogh  Phone number: [phone]  Insurance/Payer: Medicare Part A     Reason for Referral: To obtain a power wheelchair for independent mobility  Patient/Caregiver Goals: To have a power wheelchair for more independence at home and when out with family   Patient was seen for face-to-face evaluation for new power wheelchair.  Also present was patient's sister Susan and Joshua Cadle, ATP to discuss recommendations and wheelchair options.  Further paperwork was completed and sent to vendor.  Patient appears  to qualify for power mobility device at this time per objective findings.   MEDICAL HISTORY: Diagnosis: Primary Diagnosis: L CVA; Spastic R hemiparesis of dominant side Onset: 10/29/2006 Diagnosis: Type 2 Diabetes Mellitus, Peripheral Neuropathy   []Progressive Disease Relevant past and future surgeries: N/A   Height: 6' 4" Weight: 288.5 Explain recent changes or trends in weight: Stable   History including Falls: No falls at home    HOME ENVIRONMENT: [x]House  []Condo/town home  []Apartment  []Assisted Living    []Lives Alone [x] Lives with Others                                                                                          Hours with caregiver: >8 hours a day with sister  [x]Home is accessible to patient           Stairs      []Yes [x] No     Ramp []Yes [x]No Comments:  Threshold entry   COMMUNITY ADL: TRANSPORTATION: [x]Car    []Van    [x]Public Transportation    []Adapted w/c Lift    []Ambulance    []Other:       []Sits in wheelchair during transport  Employment/School: N/A Specific requirements pertaining to mobility ?????  Other: ?????    FUNCTIONAL/SENSORY PROCESSING SKILLS:  Handedness:   []Right     [x]Left    []NA  Comments:  Left handed prior to CVA as well  Functional Processing Skills for Wheeled Mobility [x]Processing Skills are adequate for safe wheelchair operation  Areas of concern than may interfere with safe operation of wheelchair Description of problem   [] Attention to environment      [x]Judgment      [] Hearing  [] Vision or visual processing      [x]Motor Planning  [] Fluctuations in Behavior  In novel situations or outdoors will require supervision for safe navigation.    VERBAL COMMUNICATION: [x]WFL receptive [] WFL expressive []Understandable  [x]Difficult to understand  []non-communicative [] Uses an augmented communication device  CURRENT SEATING / MOBILITY: Current Mobility Base:  []None []Dependent [x]Manual []Scooter []Power  Type of  Control: ?????  Manufacturer:  InvacareSize:  20 x 18 Age: 7 years  Current Condition of Mobility Base:  Poor, L arm rest torn exposing metal, inadequate cushion,   seat and back hammocking, cushion slides out of the back of the w/c, tires worn slick    Current Wheelchair components:  Flip back arm rests, manual brakes, swing away removable leg rests, hand rims, foam cushion  Describe posture in present seating system:  R hip externally rotated and   ABD-Lower leg presses against leg rest, trunk flexed and pelvis posteriorly rotated, buttocks overhangs past back of seat base      SENSATION and SKIN ISSUES: Sensation []Intact  [x]Impaired []Absent  Level of sensation: R UE, trunk, buttocks, RLE Pressure Relief: Able to perform effective pressure relief :    []Yes  [x] No Method: attempts weight shifts but unable to maintain If not, Why?: Unable to stand and unable to shift weight efficiently or for prolonged period of time for effective pressure relief.  Skin Issues/Skin Integrity Current Skin Issues  []Yes [x]No [x]Intact [] Red area[] Open Area  []Scar Tissue [x]At risk from prolonged sitting Where  sacrum, buttocks  History of Skin Issues  []Yes [x]No Where  ????? When  ?????  Hx of skin flap surgeries  []Yes [x]No Where  ????? When  ?????  Limited sitting tolerance []Yes [x]No Hours spent sitting in wheelchair daily: 10 hours  Complaint of Pain:  Please describe: Whole right side-sore, achey, spasms, heaviness, throbbing in R lower leg against leg rest   Swelling/Edema: RUE and RLE due to poor positioning in manual w/c   ADL STATUS (in reference to wheelchair use):  Indep Assist Unable Indep with Equip Not assessed Comments  Dressing ????? x ????? x ????? bed level modified independent, tub bench assisted  Eating X ????? ????? ????? ????? seated in w/c, needs assist to cut tough meats  Toileting X ????? ????? ????? ????? urinal in w/c to urinate  Bathing ????? X ????? ?????  ????? needs assistance with transfers on tub bench  Grooming/Hygiene X ????? ????? ????? ????? performs seated in w/c  Meal Prep X ????? ????? ????? ????? simple meal prep from w/c level  IADLS ????? ????? ????? ????? X ?????  Bowel Management: [x]Continent  []Incontinent  []Accidents Comments:  ?????  Bladder Management: [x]Continent  []Incontinent  []Accidents Comments:  ?????     WHEELCHAIR SKILLS: Manual w/c Propulsion: []UE or LE strength and endurance sufficient to participate in ADLs using manual wheelchair Arm : [x]left []right   []Both      Distance: household distances with hemi technique Foot:  [x]left []right   []Both  Operate Scooter: [] Strength, hand grip, balance and transfer appropriate for use []Living environment is accessible for use of scooter  Operate Power w/c:  [x] Std. Joystick   [] Alternative Controls Indep [x] Assist [] Dependent/unable [] N/A []  [x]Safe          [x] Functional      Distance: 200  Bed confined without wheelchair [x] Yes [] No   STRENGTH/RANGE OF MOTION:  Active/Passive Range of Motion Strength  Shoulder L: WNL R: limited to 90 deg flexion and ABD PROM, no AROM L: 5/5 R: 0/5  Elbow L: WNL R: full PROM flexion <> extension L: 5/5  R: 0/5   Wrist/Hand L: WNL R: maintains PROM in resting hand splint, edema L: 5/5 R: 0/5  Hip L: WNL R: Flexion >90, rests in 20 deg ABD and external rotation  L: 5/5 R: 2/5 overall  Knee L: WNL R: can reach 90 deg flexion L: 5/5 R: 3/5 overall  Ankle L: WNL R: can reach 85 deg DF L: 5/5 R: 0/5 DF, 1/5 PF     MOBILITY/BALANCE:  [] Patient is totally dependent for mobility  ?????    Balance Transfers Ambulation  Sitting Balance: Standing Balance: [] Independent [] Independent/Modified Independent  [x] WFL     [] [facility] [x] Supervision []  Supervision  [] Uses UE for balance  [] Supervision [] Min Assist [x] Ambulates with Assist  max-total assist    [] Min Assist [] Min assist [x] Mod Assist [x]  Ambulates with Device:      [] RW  [] StW  [] Cane  [x] hemi walker L  [] Mod Assist [] Mod assist [] Max assist   [] Max Assist [x] Max assist [] Dependent [x] Indep. Short Distance Only  [] Unable [] Unable [] Lift / Sling Required Distance (in feet)  5 ft   [] Sliding board [x] Unable to Ambulate (see explanation below)  Cardio Status:  [x]Intact  [] Impaired   [] NA     elevated HR with exertion, HTN  Respiratory Status:  [x]Intact   []Impaired   []NA     SOB with exertion  Orthotics/Prosthetics: none  Comments (Address manual vs power w/c vs scooter): Transfers: supervision for level transfers squat pivot; requires mod A from lower surface back to w/c-only transfers to left side.  Needs increased assistance to transfer to right side. Non-functional ambulator and not safe to ambulate without total A.  Non functional in manual w/c-not efficient with hemi-propulsion in manual w/c.  Pt does not have adequate balance to safely transfer on and off scooter.         Anterior / Posterior Obliquity Rotation-Pelvis ?????  PELVIS    [] [x] []  Neutral Posterior Anterior  [x] [] []  WFL Rt elev Lt elev  [x] [] []  WFL Right Left                      Anterior    Anterior     [] Fixed [] Other [x] Partly Flexible [] Flexible   [] Fixed [] Other [] Partly Flexible  [] Flexible  [] Fixed [] Other [] Partly Flexible  [] Flexible   TRUNK  [x] [] []  WFL ? Thoracic ? Lumbar  Kyphosis Lordosis  [x] [] []  WFL Convex Convex  Right Left []c-curve []s-curve []multiple  [] Neutral [x] Left-anterior [] Right-anterior     [] Fixed [] Flexible [] Partly Flexible [] Other  [] Fixed [] Flexible [] Partly Flexible [] Other  [] Fixed             [] Flexible [x] Partly Flexible [] Other    Position Windswept  R hip externally rotated  HIPS          []           [x]              []   Neutral       Abduct        ADduct         [x]          []           []  Neutral Right           Left       [] Fixed [] Subluxed [x] Partly Flexible [] Dislocated [] Flexible  [] Fixed [] Other [] Partly Flexible  [] Flexible                 Foot Positioning Knee Positioning  some plantarflexion hypertonicity with knee extended    [x] WFL  [x]Lt [x]Rt [] WFL  [x]Lt [x]Rt    KNEES ROM concerns: ROM concerns:    &   [place]-Flexed []Lt []Rt ?????    FEET Plantar Flexed []Lt []Rt      Inversion                 []Lt []Rt      Eversion                 []Lt []Rt     HEAD [] Functional [x] Good Head Control  ?????  & [] Flexed         [] Extended [] Adequate Head Control    NECK [] Rotated  Lt  [] Lat Flexed Lt [x] Rotated  Rt [x] Lat Flexed Rt [] Limited Head Control     [] Cervical Hyperextension [] Absent  Head Control     SHOULDERS ELBOWS WRIST& HAND R wrist extension and finger extension restricted, edema      Left     Right    Left     Right    Left     Right   U/E [x]Functional           []Functional [facility] WFL PROM []Fisting             [x]Fisting      []elev   []dep      [x]elev   []dep       []pro -[]retract     []pro  []retract []subluxed             [x]subluxed           Goals for Wheelchair Mobility  [x] Independence with mobility in the home with motor related ADLs (MRADLs)  [] Independence with MRADLs in the community [] Provide dependent mobility  [] Provide recline     []Provide tilt   Goals for Seating system [x] Optimize pressure distribution [x] Provide support needed to facilitate function or safety [x] Provide corrective forces to assist with maintaining or improving posture [] Accommodate client's posture:   current seated postures and positions are not flexible or will not tolerate corrective forces [x] Client to be independent with relieving pressure in the wheelchair []Enhance physiological function such as breathing, swallowing, digestion  Simulation ideas/Equipment trials:Trial of power w/c in clinic-pt able to efficiently and safely navigate >200' with improved  efficiency and independence State why other equipment was unsuccessful:Ambulation assistive devices are unsuccessful due to pt requiring max-total A to perform ambulation very short distances; manual w/c does not provide sufficient postural support, positioning for hemiplegic UE and LE and pt is unable to perform independent and efficient propulsion with hemi technique in manual w/c.  Scooter is not appropriate for pt due to impaired balance and increased assistance needed for safe transfers.   MOBILITY BASE RECOMMENDATIONS and JUSTIFICATION: MOBILITY COMPONENT JUSTIFICATION  Manufacturer: QuantumModel: Q6 Edge 2.0 HD Power Wheelchair (single power seat option)   Size: Width 20Seat Depth 20 [x]provide transport from point A to B      [x]promote Indep mobility  [x]is not a safe, functional ambulator [x]walker or cane inadequate []non-standard width/depth necessary to accommodate anatomical measurement [] ?????  []Manual Mobility Base []non-functional ambulator    []Scooter/POV  []can safely operate  []can safely transfer   []has adequate trunk stability  []cannot functionally propel manual w/c  [x]Power Mobility Base  [x]non-ambulatory  [x]cannot functionally propel manual wheelchair  [x] cannot functionally and safely operate scooter/POV [x]can safely operate and willing to  []Stroller Base []infant/child  []unable to propel manual wheelchair []allows for growth []non-functional ambulator []non-functional UE []  Indep mobility is not a goal at this time  []Tilt  []Forward []Backward []Powered tilt  []Manual tilt  []change position against gravitational force on head and shoulders  []change position for pressure relief/cannot weight shift []transfers  []management of tone []rest periods []control edema []facilitate postural control  [] ?????  [x]Recline  [x]Power recline on power base []Manual recline on manual base  []accommodate femur to back angle  []bring to full recline for  ADL care  [x]change position for pressure relief/cannot weight shift [x]rest periods []repositioning for transfers or clothing/diaper /catheter changes []head positioning  []Lighter weight required []self- propulsion  []lifting [] ?????  [x]Heavy Duty required [x]user weight greater than 250# []extreme tone/ over active movement []broken frame on previous chair [] ?????  [x] Back  [] Angle Adjustable [] Custom molded Quantum Sport Back [x]postural control []control of tone/spasticity []accommodation of range of motion []UE functional control []accommodation for seating system [] ????? []provide lateral trunk support []accommodate deformity [x]provide posterior trunk support []provide lumbar/sacral support [x]support trunk in midline []Pressure relief over spinal processes  [x] Seat Cushion Quantum Tru-Comfort Skin Protection Cushion [x]impaired sensation  []decubitus ulcers present []history of pressure ulceration []prevent pelvic extension []low maintenance  [x]stabilize pelvis  []accommodate obliquity []accommodate multiple deformity [x]neutralize lower extremity position [x]increase pressure distribution [] ?????  [x] Pelvic/thigh support  [x] Lateral thigh guide [] Distal medial pad  [] Distal lateral pad [] pelvis in neutral []accommodate pelvis [x] position upper legs [x] alignment [] accommodate ROM [] decr adduction []accommodate tone [x]removable for transfers [x]decr abduction  [] Lateral trunk Supports [] Lt     [] Rt []decrease lateral trunk leaning []control tone []contour for increased contact []safety  []accommodate asymmetry [] ?????  [x] Mounting hardware  []lateral trunk supports  []back   []seat [x]headrest      [x] thigh support []fixed   []swing away []attach seat platform/cushion to w/c frame []attach back cushion to w/c frame []mount postural supports [x]mount headrest  []swing medial thigh support away []swing lateral supports away for  transfers  [x] remove thigh support for transfers    Armrests  []fixed [x]adjustable height []removable   []swing away  [x]flip back   []reclining [x]full length pads []desk    []pads tubular  [x]provide support with elbow at 90   []provide support for w/c tray [x]change of height/angles for variable activities [x]remove for transfers []allow to come closer to table top [x]remove for access to tables [] ?????  Hangers/ Leg rests  []60 []70 []90 []elevating []heavy duty  []articulating []fixed []lift off []swing away     []power []provide LE support  []accommodate to hamstring tightness []elevate legs during recline   []provide change in position for Legs []Maintain placement of feet on footplate []durability []enable transfers []decrease edema []Accommodate lower leg length [] ?????  Foot support Footplate    [x]Lt  [x] Rt  [x] Center mount [x]flip up     []depth/angle adjustable []Amputee adapter    [] Lt     [] Rt [x]provide foot support []accommodate to ankle ROM [x]transfers []Provide support for residual extremity [] allow foot to go under wheelchair base [] decrease tone  [] ?????  [] Ankle strap/heel loops []support foot on foot support []decrease extraneous movement []provide input to heel  []protect foot  Tires: []pneumatic  []flat free inserts  []solid  []decrease maintenance  []prevent frequent flats []increase shock absorbency []decrease pain from road shock []decrease spasms from road shock [] ?????  [  x] Headrest  [x]provide posterior head support []provide posterior neck support []provide lateral head support []provide anterior head support [x]support during tilt and recline []improve feeding   []improve respiration []placement of switches [x]safety  []accommodate ROM  []accommodate tone []improve visual orientation  [] Anterior chest strap [] Vest [] Shoulder retractors  []decrease forward movement of shoulder []accommodation of  TLSO []decrease forward movement of trunk []decrease shoulder elevation []added abdominal support []alignment []assistance with shoulder control  [] ?????  Pelvic Positioner [x]Belt []SubASIS bar []Dual Pull []stabilize tone [x]decrease falling out of chair/ **will not Decr potential for sliding due to pelvic tilting []prevent excessive rotation []pad for protection over boney prominence []prominence comfort []special pull angle to control rotation [] ?????  Upper Extremity Support []L   [x] R [x]Arm trough    [x]hand support [] tray       []full tray []swivel mount [x]decrease edema      [x]decrease subluxation   [x]control tone   []placement for AAC/Computer/EADL [x]decrease gravitational pull on shoulders []provide midline positioning []provide support to increase UE function [x]provide hand support in natural position []provide work surface   POWER WHEELCHAIR CONTROLS  [x]Proportional  []Non-Proportional Type joystick [x]Left  []Right [x]provides access for controlling wheelchair   []lacks motor control to operate proportional drive control []unable to understand proportional controls  Actuator Control Module  [x]Single  []Multiple   [x]Allow the client to operate the power seat function(s) through the joystick control   []Safety Reset Switches []Used to change modes and stop the wheelchair when driving in latch mode    [x]Upgraded Electronics   []programming for accurate control []progressive Disease/changing condition []non-proportional drive control needed [x]Needed in order to operate power seat functions through joystick control   []Display box []Allows user to see in which mode and drive the wheelchair is set  []necessary for alternate controls    []Digital interface electronics []Allows w/c to operate when using alternative drive controls  []ASL Head Array []Allows client to operate wheelchair  through switches placed in tri-panel headrest  []Sip and puff with  tubing kit []needed to operate sip and puff drive controls  []Upgraded tracking electronics []increase safety when driving []correct tracking when on uneven surfaces  [x]Mount for switches or joystick []Attaches switches to w/c  [x]Swing away for access or transfers []midline for optimal placement []provides for consistent access  []Attendant controlled joystick plus mount []safety []long distance driving []operation of seat functions []compliance with transportation regulations [] ?????    Rear wheel placement/Axle adjustability []None []semi adjustable []fully adjustable  []improved UE access to wheels []improved stability []changing angle in space for improvement of postural stability []1-arm drive access []amputee pad placement [] ?????  Wheel rims/ hand rims  []metal  []plastic coated []oblique projections []vertical projections []Provide ability to propel manual wheelchair  [] Increase self-propulsion with hand weakness/decreased grasp  Push handles []extended  []angle adjustable  []standard []caregiver access []caregiver assist []allows "hooking" to enable increased ability to perform ADLs or maintain balance  One armed device  []Lt   []Rt []enable propulsion of manual wheelchair with one arm   [] ?????   Brake/wheel lock extension [] Lt   [] Rt []increase indep in applying wheel locks   []Side guards []prevent clothing getting caught in wheel or becoming soiled [] prevent skin tears/abrasions  Battery: [employer] 24 Battery (x2) [x]to power wheelchair ?????  Other: ????? ????? ?????  The above equipment has a life- long use expectancy. Growth and   changes in medical and/or functional conditions would be the exceptions. This is to certify that the therapist has no financial relationship with durable medical provider or manufacturer. The therapist will not receive remuneration of any kind for the equipment recommended in this evaluation.   Patient has mobility limitation that  significantly impairs safe, timely participation in one or more mobility related ADL's.  (bathing, toileting, feeding, dressing, grooming, moving from room to room)                                                             [x] Yes [] No Will mobility device sufficiently improve ability to participate and/or be aided in participation of MRADL's?         [x] Yes [] No Can limitation be compensated for with use of a cane or walker?                                                                                [] Yes [x] No Does patient or caregiver demonstrate ability/potential ability & willingness to safely use the mobility device?   [x] Yes [] No Does patient's home environment support use of recommended mobility device?                                                    [x] Yes [] No Does patient have sufficient upper extremity function necessary to functionally propel a manual wheelchair?    [] Yes [x] No Does patient have sufficient strength and trunk stability to safely operate a POV (scooter)?                                  [] Yes [x] No Does patient need additional features/benefits provided by a power wheelchair for MRADL's in the home?       [x] Yes [] No Does the patient demonstrate the ability to safely use a power wheelchair?                                                              [x] Yes [] No  Therapist Name Printed: Geralyn Flash, PT, DPT Date: 10/09/2016  Therapist's Signature:   Date:   Supplier's Name Printed: ????? Date: ?????  Supplier's Signature:   Date:  Patient/Caregiver Signature:   Date:     This is to certify that I have read this evaluation and do agree with the content within:      Physician's Name Printed: ?????  Physician's Signature:  Date:     This is to certify  that I, the above signed therapist have the following affiliations: [] This DME provider [] Manufacturer of recommended equipment [] Patient's long term care facility [x] None of the  above            PT Education - 10/12/16 1655    Education provided Yes   Education Details power w/c recommendations   Person(s) Educated Patient;Caregiver(s)  sister   Methods Explanation;Demonstration   Comprehension Verbalized understanding          PT Short Term Goals - 10/05/16 1354      PT SHORT TERM GOAL #1   Title Patient demonstrates understanding of initial HEP. (Target Date: 10/20/2016)   Time 4   Period Weeks   Status On-going     PT SHORT TERM GOAL #2   Title Patient performs stand pivot transfer with AD with supervision.  (Target Date: 10/20/2016)   Time 4   Period Weeks   Status On-going     PT SHORT TERM GOAL #3   Title Patient demonstrates weight shift onto right LE in stance with cueing.  (Target Date: 10/20/2016)   Time 4   Period Weeks   Status On-going           PT Long Term Goals - 10/05/16 1354      PT LONG TERM GOAL #1   Title Patient and sister demonstrate understanding of ongoing HEP. (Target Date: 11/17/2016)   Time 8   Period Weeks   Status On-going     PT LONG TERM GOAL #2   Title Patient standing balance: looks over shoulders & up with UE support, manages clothes & reaches 5" without UE support modified independent.  (Target Date: 11/17/2016)   Time 8   Period Weeks   Status On-going     PT LONG TERM GOAL #3   Title Patient verbalizes recommendations for equipment to maximize independence and proper use. (Target Date: 11/17/2016)   Time 8   Period Weeks   Status On-going     PT LONG TERM GOAL #4   Title Patient ambulates 10' with AD modified independent. (Target Date: 11/17/2016)   Time 8   Period Weeks   Status On-going               Plan - 10/12/16 1656    Clinical Impression Statement Completed power w/c evaluation today with pt participating in trial of power w/c.  Following trial and discussion with pt and sister regarding recommendations and financial barriers therapist is recommending Group 3 power w/c  with power recline, R lateral thigh support to position hip in neutral and RUE trough for positioning and edema management.  Please see LMN for full seating details.   Clinical Impairments Affecting Rehab Potential CVA with right hemiparesis 2008, chronic venous insufficiency, DM2, GERD, gout, HLD, HTN, morbid obesity, sleep apnea,   PT Treatment/Interventions ADLs/Self Care Home Management;Moist Heat;Ultrasound;DME Instruction;Gait training;Stair training;Functional mobility training;Therapeutic activities;Therapeutic exercise;Balance training;Neuromuscular re-education;Patient/family education;Wheelchair mobility training   PT Next Visit Plan Transfer and gait training   Consulted and Agree with Plan of Care Patient   Family Member Consulted sister, Susan      Patient will benefit from skilled therapeutic intervention in order to improve the following deficits and impairments:  Abnormal gait, Decreased activity tolerance, Decreased balance, Decreased endurance, Decreased knowledge of precautions, Decreased knowledge of use of DME, Decreased mobility, Decreased safety awareness, Decreased strength, Impaired flexibility, Postural dysfunction, Pain  Visit Diagnosis: Unsteadiness on feet  Muscle weakness (generalized)  Contracture   of muscle, multiple sites  Chronic right-sided low back pain without sciatica  Other abnormalities of gait and mobility  Hemiplegia and hemiparesis following cerebral infarction affecting right non-dominant side (HCC)     Problem List Patient Active Problem List   Diagnosis Date Noted  . Adjustment disorder with mixed anxiety and depressed mood   . Benign essential HTN   . Type 2 diabetes mellitus with peripheral neuropathy (HCC)   . Stage 3 chronic kidney disease   . Spastic hemiparesis of right dominant side (HCC) 09/08/2016  . Weakness   . Myoclonus   . Seizures (HCC)   . TIA (transient ischemic attack) 09/07/2016  . Chronic venous insufficiency   .  History of cerebral infarction (HCC) 10/21/2010  . DM (diabetes mellitus) (HCC) 10/21/2010  . HLD (hyperlipidemia) 10/21/2010  . HTN (hypertension) 10/21/2010  . Sleep apnea 10/21/2010     Hall, PT, DPT 10/12/16    5:07 PM    [facility] [address] [place], [place], [address] Phone: [phone]   Fax:  [phone]  Name: Eduardo Young MRN: [id number] Date of Birth: 10/10/1955   

## 2016-10-12 NOTE — Therapy (Signed)
[facility] [address] [place], [place], [address] Phone: [phone]   Fax:  [phone]  Occupational Therapy Treatment  Patient Details  Name: Eduardo Young MRN: [id number] Date of Birth: 1955-07-29 Referring Provider: Dr. Naaman Plummer  Encounter Date: 10/12/2016      OT End of Session - 10/12/16 1555    Visit Number 5   Number of Visits 16   Date for OT Re-Evaluation 11/16/16   Authorization Type medicare will need G code and PN every 10th visit   Authorization Time Period 60 days   Authorization - Visit Number 5   Authorization - Number of Visits 10   OT Start Time 3790   OT Stop Time 2409   OT Time Calculation (min) 41 min   Activity Tolerance Patient tolerated treatment well      Past Medical History:  Diagnosis Date  . Chronic venous insufficiency   . Diabetes mellitus   . GERD (gastroesophageal reflux disease)   . Gout   . Hemiparesis ([place])   . Hyperlipidemia   . Hypertension   . Obesity   . Renal calculi   . Sleep apnea, obstructive   . Stroke [facility])    [id number]    Past Surgical History:  Procedure Laterality Date  . LITHOTRIPSY    . NASAL POLYP EXCISION      There were no vitals filed for this visit.      Subjective Assessment - 10/12/16 1410    Subjective  I have to walk in and out of the bathroom and in out of my room to my bed.     Pertinent History see epic. Pt with initial stroke in 2008, recently admitted to hospital thru ED on 09/07/2016 pt experiencing increased weakness in RLE - CT negative, overall pt has been declining over past year.   Patient Stated Goals to move around better    Currently in Pain? Yes   Pain Score 4    Pain Location Leg   Pain Orientation Right   Pain Descriptors / Indicators Throbbing   Pain Type Chronic pain;Neuropathic pain   Pain Onset More than a month ago   Pain Frequency Constant   Aggravating Factors  not sure   Pain Relieving Factors not sure  except laying down                      OT Treatments/Exercises (OP) - 10/12/16 0001      Neurological Re-education Exercises   Other Exercises 1 Neuro re ed to address sit to stand, static and dynamic standing balance first with 1 UE support then progressing to static with no UE support and then dyamic with no UE support during functional tasks.  Initially pt needed min a and mod vc's for safety and impulsivity however with practice performance improved to close supervision. Pt reports that he has to walk in and out of the bathroom as well as in and out of his bedroom as neither are wheelchair accessible.  Pt required multiple rest breaks during standing activities displaying SOB with simple activity.                   OT Short Term Goals - 10/12/16 1553      OT SHORT TERM GOAL #1   Title Pt and sister will be mod I with HEP - 10/19/2016   Status Achieved     OT SHORT TERM GOAL #2   Title Pt and  sister will be able to identify 2 strategies for normalizing tone   Status On-going     OT SHORT TERM GOAL #3   Title Pt and sister will be mod I with splint wear and care   Status Achieved     OT SHORT TERM GOAL #4   Title Pt and sister will be mod I for UE positioing in wheelchair and in bed   Status Achieved           OT Long Term Goals - 10/12/16 1554      OT LONG TERM GOAL #1   Title Pt and sister will be mod I with ugraded HEP PRN- 11/16/2016   Status On-going     OT LONG TERM GOAL #2   Title Pt and sister will be mod I with edema control techniques for RUE   Status On-going     OT LONG TERM GOAL #3   Title Pt will demonstrate adequate balance for safety and increased ease with basic transfers in the home (toilet and bed)   Status On-going     OT LONG TERM GOAL #4   Title Pt will be able to demostrate adequate standing balance without UE to enage in simple ADL activities.    Status On-going               Plan - 10/12/16 1554     Clinical Impression Statement Pt progressing toward goals. Pt tolerating resting hand splint well - reinforced that pt should only wear at night at this point. Pt verbalized understanding   Rehab Potential Fair   Clinical Impairments Affecting Rehab Potential severity of deficits, length of time since onset   OT Frequency 2x / week   OT Duration 8 weeks   OT Treatment/Interventions Self-care/ADL training;Therapeutic exercise;Neuromuscular education;DME and/or AE instruction;Passive range of motion;Manual Therapy;Functional Mobility Training;Splinting;Therapeutic activities;Patient/family education;Balance training   Plan tone reduction techniques, NMR for RUE/trunk, balane and functional mobility   Consulted and Agree with Plan of Care Patient      Patient will benefit from skilled therapeutic intervention in order to improve the following deficits and impairments:  Abnormal gait, Decreased activity tolerance, Decreased balance, Decreased cognition, Decreased mobility, Decreased strength, Difficulty walking, Impaired UE functional use, Impaired tone, Impaired sensation, Pain  Visit Diagnosis: 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 Adjustment disorder with mixed anxiety and depressed mood   . Benign essential HTN   . Type 2 diabetes mellitus with peripheral neuropathy (HCC)   . Stage 3 chronic kidney disease   . Spastic hemiparesis of right dominant side ([facility]) 09/08/2016  . Weakness   . Myoclonus   . Seizures ([place])   . TIA (transient ischemic attack) 09/07/2016  . Chronic venous insufficiency   . History of cerebral infarction ([place]) 10/21/2010  . DM (diabetes mellitus) ([place]) 10/21/2010  . HLD  (hyperlipidemia) 10/21/2010  . HTN (hypertension) 10/21/2010  . Sleep apnea 10/21/2010    [name], OTR/L 10/12/2016, 3:56 PM  [facility] [address] [place], [place], [address] Phone: [phone]   Fax:  [phone]  Name: MORDECHAI MATUSZAK MRN: [id number] Date of Birth: 05/09/1956

## 2016-10-17 ENCOUNTER — Ambulatory Visit: Payer: Medicare Other | Admitting: Occupational Therapy

## 2016-10-18 ENCOUNTER — Encounter: Payer: Self-pay | Admitting: Physical Therapy

## 2016-10-18 ENCOUNTER — Ambulatory Visit: Payer: Medicare Other | Admitting: Physical Therapy

## 2016-10-18 DIAGNOSIS — M6249 Contracture of muscle, multiple sites: Secondary | ICD-10-CM | POA: Diagnosis not present

## 2016-10-18 DIAGNOSIS — R293 Abnormal posture: Secondary | ICD-10-CM

## 2016-10-18 DIAGNOSIS — I679 Cerebrovascular disease, unspecified: Principal | ICD-10-CM

## 2016-10-18 DIAGNOSIS — R2681 Unsteadiness on feet: Secondary | ICD-10-CM

## 2016-10-18 DIAGNOSIS — R2689 Other abnormalities of gait and mobility: Secondary | ICD-10-CM | POA: Diagnosis not present

## 2016-10-18 DIAGNOSIS — G8113 Spastic hemiplegia affecting right nondominant side: Secondary | ICD-10-CM

## 2016-10-18 DIAGNOSIS — M6281 Muscle weakness (generalized): Secondary | ICD-10-CM | POA: Diagnosis not present

## 2016-10-18 DIAGNOSIS — M545 Low back pain: Secondary | ICD-10-CM | POA: Diagnosis not present

## 2016-10-18 DIAGNOSIS — G8929 Other chronic pain: Secondary | ICD-10-CM | POA: Diagnosis not present

## 2016-10-18 DIAGNOSIS — I69353 Hemiplegia and hemiparesis following cerebral infarction affecting right non-dominant side: Secondary | ICD-10-CM | POA: Diagnosis not present

## 2016-10-21 ENCOUNTER — Other Ambulatory Visit: Payer: Self-pay | Admitting: Family Medicine

## 2016-10-21 ENCOUNTER — Other Ambulatory Visit: Payer: Self-pay | Admitting: Physical Medicine and Rehabilitation

## 2016-10-21 DIAGNOSIS — I10 Essential (primary) hypertension: Secondary | ICD-10-CM

## 2016-10-22 NOTE — Therapy (Signed)
[facility] [address] [place], [place], [address] Phone: [phone]   Fax:  [phone]  Physical Therapy Treatment  Patient Details  Name: BOB DAVERSA MRN: [id number] Date of Birth: [birth year] Referring Provider: Alger Simons, MD  Encounter Date: 10/18/2016   10/18/16 0942  PT Visits / Re-Eval  Visit Number 6  Number of Visits 17  Date for PT Re-Evaluation 11/20/16  Authorization  Authorization Type Medicare G-code & progress note every 10th visit  PT Time Calculation  PT Start Time 0932  PT Stop Time 1015  PT Time Calculation (min) 43 min  PT - End of Session  Activity Tolerance Patient tolerated treatment well  Behavior During Therapy [facility] for tasks assessed/performed     Past Medical History:  Diagnosis Date  . Chronic venous insufficiency   . Diabetes mellitus   . GERD (gastroesophageal reflux disease)   . Gout   . Hemiparesis ([place])   . Hyperlipidemia   . Hypertension   . Obesity   . Renal calculi   . Sleep apnea, obstructive   . Stroke [facility])    [id number]    Past Surgical History:  Procedure Laterality Date  . LITHOTRIPSY    . NASAL POLYP EXCISION      There were no vitals filed for this visit.   10/18/16 0937  Symptoms/Limitations  Subjective Reports having abdominal pain for last few days. Called Md who wanted him to drink more water and to stop breaking his K+ pill in half, take it whole. Has has several bowel movements yesterday and reportes his abdomen feels better today, 3/10 pain. Md said for him to do this for ~ a month and see if everything gets more regulated. No falls to report.   Pertinent History CVA with right hemiparesis 2008, chronic venous insufficiency, DM2, GERD, gout, HLD, HTN, morbid obesity, sleep apnea,   Limitations Standing;Walking;House hold activities  Patient Stated Goals To improve activity level to what he is capable of doing.   Pain Assessment  Currently  in Pain? Yes  Pain Score 5  Pain Location Leg  Pain Orientation Right  Pain Descriptors / Indicators Throbbing  Pain Type Chronic pain;Neuropathic pain  Pain Onset More than a month ago  Pain Frequency Constant  Aggravating Factors  not sure  Pain Relieving Factors laying down   treatment: Pt was able to demo all current HEP exercises with minimal cues on technique and ex form.    10/18/16 0943  Transfers  Transfers Sit to Stand;Stand to Sit;Stand Pivot Transfers  Sit to Stand 5: Supervision;With upper extremity assist;From chair/3-in-1  Stand to Sit 5: Supervision;With upper extremity assist;To bed  Stand Pivot Transfers 5: Supervision  Stand Pivot Transfer Details (indicate cue type and reason) with hemiwalker from wheelchair>mat table, cues on posture. supervision for safety  Neuro Re-ed   Neuro Re-ed Details  standing with UE support on back of chair: right stance- attempted to have pt tap 2 inch box with left foot, pt too fearful and unable to lift left foot up off floor to achieve this. Switched to having pt step to circle dot target on floor, working on increased step length, pt able to perform 2 sets of 8 reps with min assist, max cues/encouragement.           PT Short Term Goals - 10/18/16 2048      PT SHORT TERM GOAL #1   Title Patient demonstrates understanding of initial HEP. (Target Date: 10/20/2016)  Baseline 10/18/16: met today in session   Status Achieved     PT SHORT TERM GOAL #2   Title Patient performs stand pivot transfer with AD with supervision.  (Target Date: 10/20/2016)   Baseline 10/18/16: met today in session   Status Achieved     PT SHORT TERM GOAL #3   Title Patient demonstrates weight shift onto right LE in stance with cueing.  (Target Date: 10/20/2016)   Baseline 10/18/16: continues to demo decreased weight shifting and needs max cues/encouragement to shift any weight onto right LE   Status Partially Met           PT Long Term Goals -  10/05/16 1354      PT LONG TERM GOAL #1   Title Patient and sister demonstrate understanding of ongoing HEP. (Target Date: 11/17/2016)   Time 8   Period Weeks   Status On-going     PT LONG TERM GOAL #2   Title Patient standing balance: looks over shoulders & up with UE support, manages clothes & reaches 5" without UE support modified independent.  (Target Date: 11/17/2016)   Time 8   Period Weeks   Status On-going     PT LONG TERM GOAL #3   Title Patient verbalizes recommendations for equipment to maximize independence and proper use. (Target Date: 11/17/2016)   Time 8   Period Weeks   Status On-going     PT LONG TERM GOAL #4   Title Patient ambulates 10' with AD modified independent. (Target Date: 11/17/2016)   Time 8   Period Weeks   Status On-going        10/18/16 4010  Plan  Clinical Impression Statement Today's session focused on progress toward STGs with pt meeting 2/3 goals and partially meeting the 3rd goal. Also discussed with pt's sister getting exact measurements of distance pt needs to safely walk in bathroom and bedroom. She is to bring those at next session. Discussed with pt trial of orthotics for foot clearance/improved ankle control with gait. Pt is agreeable to this, however unable to today due to pt wearing slippers. Pt does have tennis shoes at home, he is to bring those next session. Pt's sister made aware of this need as well. Remainder of today's session focused on static standing with UE support with emphasis on increased right LE weight shifting. Pt is making progress toward goals and should benefit from continued PT to progress toward unmet goals.                              Pt will benefit from skilled therapeutic intervention in order to improve on the following deficits Abnormal gait;Decreased activity tolerance;Decreased balance;Decreased endurance;Decreased knowledge of precautions;Decreased knowledge of use of DME;Decreased mobility;Decreased safety  awareness;Decreased strength;Impaired flexibility;Postural dysfunction;Pain  Clinical Impairments Affecting Rehab Potential CVA with right hemiparesis 2008, chronic venous insufficiency, DM2, GERD, gout, HLD, HTN, morbid obesity, sleep apnea,  PT Treatment/Interventions ADLs/Self Care Home Management;Moist Heat;Ultrasound;DME Instruction;Gait training;Stair training;Functional mobility training;Therapeutic activities;Therapeutic exercise;Balance training;Neuromuscular re-education;Patient/family education;Wheelchair mobility training  PT Next Visit Plan Transfer and gait training; trial orthotics if pt has appropriate shoes  Consulted and Agree with Plan of Care Patient  Family Member Consulted sister, Manuela Schwartz       Patient will benefit from skilled therapeutic intervention in order to improve the following deficits and impairments:  Abnormal gait, Decreased activity tolerance, Decreased balance, Decreased endurance, Decreased knowledge of precautions, Decreased knowledge of use  of DME, Decreased mobility, Decreased safety awareness, Decreased strength, Impaired flexibility, Postural dysfunction, Pain  Visit Diagnosis: 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, unspecified cerebrovascular disease type (HCC)  Unsteadiness on feet  Muscle weakness (generalized)  Abnormal posture  Other abnormalities of gait and mobility     Problem List Patient Active Problem List   Diagnosis Date Noted  . Adjustment disorder with mixed anxiety and depressed mood   . Benign essential HTN   . Type 2 diabetes mellitus with peripheral neuropathy (HCC)   . Stage 3 chronic kidney disease   . Spastic hemiparesis of right dominant side ([place]) 09/08/2016  . Weakness   . Myoclonus   . Seizures ([place])   . TIA (transient ischemic attack) 09/07/2016  . Chronic venous insufficiency   . History of cerebral infarction ([place]) 10/21/2010  . DM (diabetes mellitus) ([place]) 10/21/2010   . HLD (hyperlipidemia) 10/21/2010  . HTN (hypertension) 10/21/2010  . Sleep apnea 10/21/2010    Willow Ora, PTA, [facility] [address], [place], [place] [address] [phone] 10/22/16, 9:07 PM   Name: FELIPE PALUCH MRN: [id number] Date of Birth: 1955/10/29

## 2016-10-24 ENCOUNTER — Telehealth: Payer: Self-pay | Admitting: *Deleted

## 2016-10-24 ENCOUNTER — Encounter: Payer: Self-pay | Admitting: Occupational Therapy

## 2016-10-24 ENCOUNTER — Ambulatory Visit: Payer: Medicare Other | Admitting: Occupational Therapy

## 2016-10-24 ENCOUNTER — Ambulatory Visit: Payer: Medicare Other | Attending: Physical Medicine and Rehabilitation | Admitting: Physical Therapy

## 2016-10-24 ENCOUNTER — Encounter: Payer: Self-pay | Admitting: Physical Therapy

## 2016-10-24 DIAGNOSIS — I69353 Hemiplegia and hemiparesis following cerebral infarction affecting right non-dominant side: Secondary | ICD-10-CM | POA: Diagnosis not present

## 2016-10-24 DIAGNOSIS — R208 Other disturbances of skin sensation: Secondary | ICD-10-CM

## 2016-10-24 DIAGNOSIS — M6281 Muscle weakness (generalized): Secondary | ICD-10-CM | POA: Diagnosis not present

## 2016-10-24 DIAGNOSIS — I679 Cerebrovascular disease, unspecified: Principal | ICD-10-CM

## 2016-10-24 DIAGNOSIS — G8113 Spastic hemiplegia affecting right nondominant side: Secondary | ICD-10-CM

## 2016-10-24 DIAGNOSIS — R293 Abnormal posture: Secondary | ICD-10-CM

## 2016-10-24 DIAGNOSIS — R2681 Unsteadiness on feet: Secondary | ICD-10-CM

## 2016-10-24 DIAGNOSIS — R2689 Other abnormalities of gait and mobility: Secondary | ICD-10-CM | POA: Diagnosis not present

## 2016-10-24 DIAGNOSIS — M79641 Pain in right hand: Secondary | ICD-10-CM | POA: Insufficient documentation

## 2016-10-24 DIAGNOSIS — M6249 Contracture of muscle, multiple sites: Secondary | ICD-10-CM | POA: Insufficient documentation

## 2016-10-24 DIAGNOSIS — G8110 Spastic hemiplegia affecting unspecified side: Secondary | ICD-10-CM

## 2016-10-24 NOTE — Therapy (Signed)
[facility] [address] [place], [place], [address] Phone: [phone]   Fax:  [phone]  Physical Therapy Treatment  Patient Details  Name: Eduardo Young MRN: [id number] Date of Birth: 1956/04/16 Referring Provider: Alger Simons, MD  Encounter Date: 10/24/2016      PT End of Session - 10/24/16 2132    Visit Number 7   Number of Visits 17   Date for PT Re-Evaluation 11/20/16   Authorization Type Medicare G-code & progress note every 10th visit   PT Start Time 1510   PT Stop Time 1620   PT Time Calculation (min) 70 min   Activity Tolerance Patient tolerated treatment well   Behavior During Therapy [facility] for tasks assessed/performed;Anxious      Past Medical History:  Diagnosis Date  . Chronic venous insufficiency   . Diabetes mellitus   . GERD (gastroesophageal reflux disease)   . Gout   . Hemiparesis ([place])   . Hyperlipidemia   . Hypertension   . Obesity   . Renal calculi   . Sleep apnea, obstructive   . Stroke [facility])    [id number]    Past Surgical History:  Procedure Laterality Date  . LITHOTRIPSY    . NASAL POLYP EXCISION      There were no vitals filed for this visit.      Subjective Assessment - 10/24/16 1539    Subjective He has appointment for power wheelchair this Thursday morning.    Pertinent History CVA with right hemiparesis 2008, chronic venous insufficiency, DM2, GERD, gout, HLD, HTN, morbid obesity, sleep apnea,    Limitations Standing;Walking;House hold activities   Patient Stated Goals To improve activity level to what he is capable of doing.    Currently in Pain? Yes   Pain Score 5    Pain Location Leg   Pain Orientation Right   Pain Descriptors / Indicators Throbbing   Pain Type Chronic pain;Neuropathic pain   Pain Onset More than a month ago   Pain Frequency Constant   Aggravating Factors  not sure   Pain Relieving Factors laying down   Effect of Pain on Daily Activities  limits sitting up     Orthotic Training: PT co-treated for last 20 minutes of OT session prior to today's PT to trial AFO for standing & gait. Patient trailed AFO to assess RLE standing and gait. AFO used was [employer] dynamic response due to medial strut & T-strap to control over supination and anterior plate to decrease knee flexion potential (pt hyperextends knee due to weakness & fear of flexion moment).  AFO supported ankle in neutral (not over supinated) position with standing & gait decreasing risk of ankle injury. He continued to hyperextend knee. Pt ambulated with hemiwalker with OT & PT minA but only if sister followed closely with w/c due to fear.  Pt ambulated 6' in parallel bar with LUE support with RLE clearance improved, advancing 4" compared to previous 2", improved RLE stance as noted by improved clearance & step length of LLE. Without AFO LLE toe 4-6" posterior to RLE toe. With AFO, LLE progressed to 1" posterior by end of 6'.  Self-Care: PT, pt & sister discussed home renovations that could improve accessibility to bedroom & bathroom. The w/c currently will not fit thru bedroom or bathroom doors. They report only one full bath in home that has access from both hall & his bedroom. They report currently walks ~5' from hall to bed with hemiwalker and ~  5' from bedroom to toilet or tub. Access from hall to toilet or tub appear shorter. PT recommended considering using this door to access bathroom.  PT, pt & sister discussed toileting issues. Pt currently urinates in diaper during the day due to inability to access bathroom timely and availability of sister to assist. At night, he sits up to side of bed to use urinal. Pt also reports decreased bowel movements since last hospitalization. He has urge to have BM usually form 6:00-6:30am but does away by time he arises after 8:00am. PT reinforced need for hydration to decrease constipation.  PT recommended timing toileting. Initially try  urinating ~every 1 hour then if minimal urine output extend to every 1.5 hours. PT recommended considering with sister's assistance standing with right hip / pelvis leaning on sturdy support like entertainment center with hemiwalker on left side & w/c behind him to use urinal. Pt & sister verbalized understanding with plans to trial. PT recommended positioning BSC at edge of bed and pt transferring to [facility] with urge for BM. PT instructed sitting posture can effect ability to have BM without valsalva maneuver. Pt & sister verbalized understanding of recommendation.                             PT Education - 10/24/16 1620    Education provided Yes   Education Details [place] for possible home renovations to widen doors & improve w/c accessibility to bedroom & bathroom.    Person(s) Educated Associate Professor)  sister   Methods Explanation;Verbal cues   Comprehension Verbalized understanding;Verbal cues required;Need further instruction          PT Short Term Goals - 10/18/16 2048      PT SHORT TERM GOAL #1   Title Patient demonstrates understanding of initial HEP. (Target Date: 10/20/2016)   Baseline 10/18/16: met today in session   Status Achieved     PT SHORT TERM GOAL #2   Title Patient performs stand pivot transfer with AD with supervision.  (Target Date: 10/20/2016)   Baseline 10/18/16: met today in session   Status Achieved     PT SHORT TERM GOAL #3   Title Patient demonstrates weight shift onto right LE in stance with cueing.  (Target Date: 10/20/2016)   Baseline 10/18/16: continues to demo decreased weight shifting and needs max cues/encouragement to shift any weight onto right LE   Status Partially Met           PT Long Term Goals - 10/05/16 1354      PT LONG TERM GOAL #1   Title Patient and sister demonstrate understanding of ongoing HEP. (Target Date: 11/17/2016)   Time 8   Period Weeks   Status On-going     PT  LONG TERM GOAL #2   Title Patient standing balance: looks over shoulders & up with UE support, manages clothes & reaches 5" without UE support modified independent.  (Target Date: 11/17/2016)   Time 8   Period Weeks   Status On-going     PT LONG TERM GOAL #3   Title Patient verbalizes recommendations for equipment to maximize independence and proper use. (Target Date: 11/17/2016)   Time 8   Period Weeks   Status On-going     PT LONG TERM GOAL #4   Title Patient ambulates 10' with AD modified independent. (Target Date: 11/17/2016)   Time 8   Period Weeks   Status On-going  Plan - 10/24/16 2136    Clinical Impression Statement Patient had improved clearance & ankle stability in stance with AFO with T-strap. Positioning without AFO places RLE at HIGH risk of ankle fracture or injury and falls.    Clinical Impairments Affecting Rehab Potential CVA with right hemiparesis 2008, chronic venous insufficiency, DM2, GERD, gout, HLD, HTN, morbid obesity, sleep apnea,   PT Treatment/Interventions ADLs/Self Care Home Management;Moist Heat;Ultrasound;DME Instruction;Gait training;Stair training;Functional mobility training;Therapeutic activities;Therapeutic exercise;Balance training;Neuromuscular re-education;Patient/family education;Wheelchair mobility training   PT Next Visit Plan Transfer and gait training; orthotic consult with orthotist   Consulted and Agree with Plan of Care Patient;Family member/caregiver   Family Member Consulted sister, Manuela Schwartz      Patient will benefit from skilled therapeutic intervention in order to improve the following deficits and impairments:  Abnormal gait, Decreased activity tolerance, Decreased balance, Decreased endurance, Decreased knowledge of precautions, Decreased knowledge of use of DME, Decreased mobility, Decreased safety awareness, Decreased strength, Impaired flexibility, Postural dysfunction, Pain  Visit Diagnosis: 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 Adjustment disorder with mixed anxiety and depressed mood   . Benign essential HTN   . Type 2 diabetes mellitus with peripheral neuropathy (HCC)   . Stage 3 chronic kidney disease   . Spastic hemiparesis of right dominant side ([place]) 09/08/2016  . Weakness   . Myoclonus   . Seizures ([place])   . TIA (transient ischemic attack) 09/07/2016  . Chronic venous insufficiency   . History of cerebral infarction ([place]) 10/21/2010  . DM (diabetes mellitus) ([place]) 10/21/2010  . HLD (hyperlipidemia) 10/21/2010  . HTN (hypertension) 10/21/2010  . Sleep apnea 10/21/2010    Jamey Reas PT, DPT 10/24/2016, 9:39 PM  [facility] [address] [place], [place], [address] Phone: [phone]   Fax:  [phone]  Name: Eduardo Young MRN: [id number] Date of Birth: 1955/10/18

## 2016-10-24 NOTE — Therapy (Signed)
[facility] [address] [place], [place], [address] Phone: [phone]   Fax:  [phone]  Occupational Therapy Treatment  Patient Details  Name: Eduardo Young MRN: [id number] Date of Birth: Feb 26, 1956 Referring Provider: Dr. Naaman Plummer  Encounter Date: 10/24/2016      OT End of Session - 10/24/16 1618    Visit Number 6   Number of Visits 16   Date for OT Re-Evaluation 11/16/16   Authorization Type medicare will need G code and PN every 10th visit   Authorization Time Period 60 days   Authorization - Visit Number 6   Authorization - Number of Visits 10   OT Start Time 1446   OT Stop Time 1530   OT Time Calculation (min) 44 min   Activity Tolerance Patient tolerated treatment well      Past Medical History:  Diagnosis Date  . Chronic venous insufficiency   . Diabetes mellitus   . GERD (gastroesophageal reflux disease)   . Gout   . Hemiparesis ([place])   . Hyperlipidemia   . Hypertension   . Obesity   . Renal calculi   . Sleep apnea, obstructive   . Stroke [facility])    [id number]    Past Surgical History:  Procedure Laterality Date  . LITHOTRIPSY    . NASAL POLYP EXCISION      There were no vitals filed for this visit.      Subjective Assessment - 10/24/16 1453    Subjective  I brought my shoes   Patient is accompained by: Family member  sister   Pertinent History see epic. Pt with initial stroke in 2008, recently admitted to hospital thru ED on 09/07/2016 pt experiencing increased weakness in RLE - CT negative, overall pt has been declining over past year.   Patient Stated Goals to move around better    Currently in Pain? Yes   Pain Score 5    Pain Location Leg   Pain Orientation Right   Pain Descriptors / Indicators Throbbing   Pain Type Chronic pain;Neuropathic pain   Pain Onset More than a month ago   Pain Frequency Constant   Aggravating Factors  not sure   Pain Relieving Factors laying down                       OT Treatments/Exercises (OP) - 10/24/16 0001      Neurological Re-education Exercises   Other Exercises 1 Neuro re ed to address stepping (side and foward) with practice AFO for R foot (per PT recommendation).  Pt with much improved R foot alignment as well as knee control however pt is extremely apraxic, has cognitive defiicts and is very fearful of falling so will need considerable practice to use this functionally.  Pt has to ambulate into bathroom by side stepping as well as has to ambulate into and out of bedroom. Will continue to practice and offer high repetition.      Manual Therapy   Manual Therapy Edema management   Manual therapy comments Pt with increased swelling again today - pt reports he is inconsistently using R arm trough at home and also only wearing night time splint about 50% of the time.  Again discussed the importance of using all strategies to keep edema out of R hand;  also discussed long term issues again that can arise from prolonged edema and fisted hand. Pt and sister both verbalized understanding. Good results with edema massage  OT Short Term Goals - 10/24/16 1617      OT SHORT TERM GOAL #1   Title Pt and sister will be mod I with HEP - 10/19/2016   Status Achieved     OT SHORT TERM GOAL #2   Title Pt and sister will be able to identify 2 strategies for normalizing tone   Status Achieved     OT SHORT TERM GOAL #3   Title Pt and sister will be mod I with splint wear and care   Status Achieved     OT SHORT TERM GOAL #4   Title Pt and sister will be mod I for UE positioing in wheelchair and in bed   Status Achieved           OT Long Term Goals - 10/24/16 1617      OT LONG TERM GOAL #1   Title Pt and sister will be mod I with ugraded HEP PRN- 11/16/2016   Status On-going     OT LONG TERM GOAL #2   Title Pt and sister will be mod I with edema control techniques for RUE   Status On-going      OT LONG TERM GOAL #3   Title Pt will demonstrate adequate balance for safety and increased ease with basic transfers in the home (toilet and bed)   Status On-going     OT LONG TERM GOAL #4   Title Pt will be able to demostrate adequate standing balance without UE to enage in simple ADL activities.    Status On-going               Plan - 10/24/16 1617    Clinical Impression Statement Pt with slow progress toward goals.  Pt has met all STG"s and now addressing LTG's.   Rehab Potential Fair   Clinical Impairments Affecting Rehab Potential severity of deficits, length of time since onset   OT Frequency 2x / week   OT Duration 8 weeks   OT Treatment/Interventions Self-care/ADL training;Therapeutic exercise;Neuromuscular education;DME and/or AE instruction;Passive range of motion;Manual Therapy;Functional Mobility Training;Splinting;Therapeutic activities;Patient/family education;Balance training   Plan NMR for trunk, RUE, balance, functional mobility, activity tolerance.   Consulted and Agree with Plan of Care Patient   Family Member Consulted sister      Patient will benefit from skilled therapeutic intervention in order to improve the following deficits and impairments:  Abnormal gait, Decreased activity tolerance, Decreased balance, Decreased cognition, Decreased mobility, Decreased strength, Difficulty walking, Impaired UE functional use, Impaired tone, Impaired sensation, Pain  Visit Diagnosis: 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 Adjustment disorder with mixed anxiety and depressed mood   . Benign essential HTN   . Type 2 diabetes mellitus with peripheral neuropathy (HCC)   .  Stage 3 chronic kidney disease   . Spastic hemiparesis of right dominant side ([place]) 09/08/2016  . Weakness   . Myoclonus   . Seizures ([place])   . TIA (transient ischemic attack) 09/07/2016  . Chronic venous insufficiency   . History of cerebral infarction ([place]) 10/21/2010  . DM (diabetes mellitus) ([place]) 10/21/2010  . HLD (hyperlipidemia) 10/21/2010  . HTN (hypertension) 10/21/2010  . Sleep apnea 10/21/2010    [name], OTR/L 10/24/2016,  4:20 PM  [facility] [address] [place], [place], [address] Phone: [phone]   Fax:  [phone]  Name: Eduardo Young MRN: [id number] Date of Birth: 29-Jan-1956

## 2016-10-24 NOTE — Telephone Encounter (Signed)
Requestig PT eval for power wheelchair order. I placed the order for the PT eval.  We have also scheduled him an appt for 10/26/16 arriving at 11:20 to see Dr Posey Pronto to do the face to face and evaluation prior to PT wheelchair eval. I left a detailed message on name identified VM about this appt,

## 2016-10-26 ENCOUNTER — Encounter: Payer: Self-pay | Admitting: Physical Therapy

## 2016-10-26 ENCOUNTER — Encounter: Payer: Self-pay | Admitting: Occupational Therapy

## 2016-10-26 ENCOUNTER — Ambulatory Visit: Payer: Medicare Other | Admitting: Occupational Therapy

## 2016-10-26 ENCOUNTER — Encounter: Payer: Self-pay | Admitting: Physical Medicine & Rehabilitation

## 2016-10-26 ENCOUNTER — Ambulatory Visit: Payer: Medicare Other | Admitting: Physical Therapy

## 2016-10-26 ENCOUNTER — Encounter: Payer: Medicare Other | Attending: Physical Medicine & Rehabilitation | Admitting: Physical Medicine & Rehabilitation

## 2016-10-26 VITALS — BP 159/90 | HR 74

## 2016-10-26 DIAGNOSIS — I69353 Hemiplegia and hemiparesis following cerebral infarction affecting right non-dominant side: Secondary | ICD-10-CM | POA: Diagnosis not present

## 2016-10-26 DIAGNOSIS — R2689 Other abnormalities of gait and mobility: Secondary | ICD-10-CM | POA: Diagnosis not present

## 2016-10-26 DIAGNOSIS — Z801 Family history of malignant neoplasm of trachea, bronchus and lung: Secondary | ICD-10-CM | POA: Diagnosis not present

## 2016-10-26 DIAGNOSIS — G253 Myoclonus: Secondary | ICD-10-CM

## 2016-10-26 DIAGNOSIS — Z87891 Personal history of nicotine dependence: Secondary | ICD-10-CM | POA: Insufficient documentation

## 2016-10-26 DIAGNOSIS — R293 Abnormal posture: Secondary | ICD-10-CM | POA: Diagnosis not present

## 2016-10-26 DIAGNOSIS — N183 Chronic kidney disease, stage 3 unspecified: Secondary | ICD-10-CM

## 2016-10-26 DIAGNOSIS — M6281 Muscle weakness (generalized): Secondary | ICD-10-CM

## 2016-10-26 DIAGNOSIS — R569 Unspecified convulsions: Secondary | ICD-10-CM

## 2016-10-26 DIAGNOSIS — K219 Gastro-esophageal reflux disease without esophagitis: Secondary | ICD-10-CM | POA: Insufficient documentation

## 2016-10-26 DIAGNOSIS — Z8249 Family history of ischemic heart disease and other diseases of the circulatory system: Secondary | ICD-10-CM | POA: Diagnosis not present

## 2016-10-26 DIAGNOSIS — G4733 Obstructive sleep apnea (adult) (pediatric): Secondary | ICD-10-CM | POA: Insufficient documentation

## 2016-10-26 DIAGNOSIS — E1121 Type 2 diabetes mellitus with diabetic nephropathy: Secondary | ICD-10-CM | POA: Insufficient documentation

## 2016-10-26 DIAGNOSIS — R269 Unspecified abnormalities of gait and mobility: Secondary | ICD-10-CM | POA: Diagnosis not present

## 2016-10-26 DIAGNOSIS — I69398 Other sequelae of cerebral infarction: Secondary | ICD-10-CM | POA: Diagnosis not present

## 2016-10-26 DIAGNOSIS — E785 Hyperlipidemia, unspecified: Secondary | ICD-10-CM | POA: Insufficient documentation

## 2016-10-26 DIAGNOSIS — E1140 Type 2 diabetes mellitus with diabetic neuropathy, unspecified: Secondary | ICD-10-CM | POA: Diagnosis not present

## 2016-10-26 DIAGNOSIS — I872 Venous insufficiency (chronic) (peripheral): Secondary | ICD-10-CM | POA: Diagnosis not present

## 2016-10-26 DIAGNOSIS — G8113 Spastic hemiplegia affecting right nondominant side: Secondary | ICD-10-CM

## 2016-10-26 DIAGNOSIS — Z823 Family history of stroke: Secondary | ICD-10-CM | POA: Diagnosis not present

## 2016-10-26 DIAGNOSIS — I679 Cerebrovascular disease, unspecified: Secondary | ICD-10-CM | POA: Diagnosis not present

## 2016-10-26 DIAGNOSIS — R2681 Unsteadiness on feet: Secondary | ICD-10-CM

## 2016-10-26 DIAGNOSIS — M109 Gout, unspecified: Secondary | ICD-10-CM | POA: Insufficient documentation

## 2016-10-26 DIAGNOSIS — R208 Other disturbances of skin sensation: Secondary | ICD-10-CM

## 2016-10-26 DIAGNOSIS — IMO0002 Reserved for concepts with insufficient information to code with codable children: Secondary | ICD-10-CM

## 2016-10-26 DIAGNOSIS — M79641 Pain in right hand: Secondary | ICD-10-CM

## 2016-10-26 DIAGNOSIS — E669 Obesity, unspecified: Secondary | ICD-10-CM | POA: Diagnosis not present

## 2016-10-26 DIAGNOSIS — F4323 Adjustment disorder with mixed anxiety and depressed mood: Secondary | ICD-10-CM

## 2016-10-26 DIAGNOSIS — E1142 Type 2 diabetes mellitus with diabetic polyneuropathy: Secondary | ICD-10-CM

## 2016-10-26 DIAGNOSIS — I639 Cerebral infarction, unspecified: Secondary | ICD-10-CM | POA: Diagnosis not present

## 2016-10-26 DIAGNOSIS — Z87442 Personal history of urinary calculi: Secondary | ICD-10-CM | POA: Insufficient documentation

## 2016-10-26 DIAGNOSIS — I10 Essential (primary) hypertension: Secondary | ICD-10-CM

## 2016-10-26 DIAGNOSIS — Z8041 Family history of malignant neoplasm of ovary: Secondary | ICD-10-CM | POA: Diagnosis not present

## 2016-10-26 DIAGNOSIS — I69351 Hemiplegia and hemiparesis following cerebral infarction affecting right dominant side: Secondary | ICD-10-CM | POA: Diagnosis present

## 2016-10-26 NOTE — Therapy (Signed)
[facility] [address] [place], [place], [address] Phone: [phone]   Fax:  [phone]  Occupational Therapy Treatment  Patient Details  Name: Eduardo Young MRN: [id number] Date of Birth: 1955/08/17 Referring Provider: Dr. Naaman Plummer  Encounter Date: 10/26/2016      OT End of Session - 10/26/16 1712    Visit Number 7   Number of Visits 16   Date for OT Re-Evaluation 11/16/16   Authorization Type medicare will need G code and PN every 10th visit   Authorization Time Period 60 days   Authorization - Visit Number 7   Authorization - Number of Visits 10   OT Start Time 2778   OT Stop Time 1616   OT Time Calculation (min) 43 min   Activity Tolerance Patient tolerated treatment well      Past Medical History:  Diagnosis Date  . Chronic venous insufficiency   . Diabetes mellitus   . GERD (gastroesophageal reflux disease)   . Gout   . Hemiparesis ([place])   . Hyperlipidemia   . Hypertension   . Obesity   . Renal calculi   . Sleep apnea, obstructive   . Stroke [facility])    [id number]    Past Surgical History:  Procedure Laterality Date  . LITHOTRIPSY    . NASAL POLYP EXCISION      There were no vitals filed for this visit.      Subjective Assessment - 10/26/16 1536    Subjective  I am willing to try   Pertinent History see epic. Pt with initial stroke in 2008, recently admitted to hospital thru ED on 09/07/2016 pt experiencing increased weakness in RLE - CT negative, overall pt has been declining over past year.   Patient Stated Goals to move around better    Currently in Pain? Yes   Pain Score 5    Pain Location Leg   Pain Orientation Right   Pain Descriptors / Indicators Aching;Sore;Throbbing   Pain Type Chronic pain;Neuropathic pain   Pain Onset More than a month ago   Pain Frequency Constant   Aggravating Factors  movement, tightness   Pain Relieving Factors stretching, laying down                       OT Treatments/Exercises (OP) - 10/26/16 1706      Neurological Re-education Exercises   Other Exercises 1 Neuro re ed to address sit to stand, static and dynamic standing balance, stepping and trunk control with practice AFO.  Pt reports that he has never been issued an AFO for RLE. Pt has great difficulty with weight bearing on RLE due to supination in foot and decreased control in RLE. Pt is also extremely apraxic and any change in routine is very challenging for patient.  WIth signficant repetition and assisting pt in feeling secure about safety, pt is slowly demonstrating ability to weight bear on RLE and to activate trunk in standing. Pt less fearful about falling and improved ability to stand in midline and with improved alignment. Pt will benefit from significant repetition and block practice.                    OT Short Term Goals - 10/26/16 1710      OT SHORT TERM GOAL #1   Title Pt and sister will be mod I with HEP - 10/19/2016   Status Achieved     OT SHORT TERM GOAL #2  Title Pt and sister will be able to identify 2 strategies for normalizing tone   Status Achieved     OT SHORT TERM GOAL #3   Title Pt and sister will be mod I with splint wear and care   Status Achieved     OT SHORT TERM GOAL #4   Title Pt and sister will be mod I for UE positioing in wheelchair and in bed   Status Achieved           OT Long Term Goals - 10/26/16 1710      OT LONG TERM GOAL #1   Title Pt and sister will be mod I with ugraded HEP PRN- 11/16/2016   Status On-going     OT LONG TERM GOAL #2   Title Pt and sister will be mod I with edema control techniques for RUE   Status Achieved     OT LONG TERM GOAL #3   Title Pt will demonstrate adequate balance for safety and increased ease with basic transfers in the home (toilet and bed)   Status On-going     OT LONG TERM GOAL #4   Title Pt will be able to demostrate adequate standing balance  without UE to enage in simple ADL activities.    Status On-going               Plan - 10/26/16 1711    Clinical Impression Statement Pt making slow but steady progress toward LTG's.  Due to significant cognitive, perceptual and motor planning goals, pt benefits from significant repetition.    Rehab Potential Fair   Clinical Impairments Affecting Rehab Potential severity of deficits, length of time since onset   OT Frequency 2x / week   OT Duration 8 weeks   OT Treatment/Interventions Self-care/ADL training;Therapeutic exercise;Neuromuscular education;DME and/or AE instruction;Passive range of motion;Manual Therapy;Functional Mobility Training;Splinting;Therapeutic activities;Patient/family education;Balance training   Plan NMR for trunk, RUE, balance, functional mobility, activity tolerance,    Consulted and Agree with Plan of Care Patient      Patient will benefit from skilled therapeutic intervention in order to improve the following deficits and impairments:  Abnormal gait, Decreased activity tolerance, Decreased balance, Decreased cognition, Decreased mobility, Decreased strength, Difficulty walking, Impaired UE functional use, Impaired tone, Impaired sensation, Pain  Visit Diagnosis: 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 Adjustment disorder with mixed anxiety and depressed mood   . Benign essential HTN   . Type 2 diabetes mellitus with peripheral neuropathy (HCC)   . Stage 3 chronic kidney disease   . Spastic hemiparesis of right dominant side ([place]) 09/08/2016  . Weakness   . Myoclonus   . Seizures ([place])   . TIA (transient ischemic attack) 09/07/2016  . Chronic venous  insufficiency   . History of cerebral infarction ([place]) 10/21/2010  . DM (diabetes mellitus) ([place]) 10/21/2010  . HLD (hyperlipidemia) 10/21/2010  . HTN (hypertension) 10/21/2010  . Sleep apnea 10/21/2010    [name], OTR/L 10/26/2016, 5:13 PM  [facility] [address] [place], [place], [address] Phone: [phone]   Fax:  [phone]  Name: Eduardo Young MRN: [id number] Date of Birth: 04/22/1956

## 2016-10-26 NOTE — Progress Notes (Addendum)
Subjective:    Patient ID: Eduardo Young, male    DOB: 05-28-1956, 61 y.o.   MRN: [id number]  HPI 61 y.o. 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  Presents with sister, who provides the history. At discharge, he was instructed he needed supervision, which he states he has.  He saw his PCP, who did lab work.  His edema has improved. His BP remains elevated.  Sister does not note any changes in medications. He has not been checking his CBGs. He notes slight improvement in hypertonicity in RUE/RLE.  Denies seizures.  Denies falls.   Pain Inventory Average Pain 5 Pain Right Now 0 My pain is aching  In the last 24 hours, has pain interfered with the following? General activity 0 Relation with others 0 Enjoyment of life 0 What TIME of day is your pain at its worst? morning Sleep (in general) Fair  Pain is worse with: unsure Pain improves with: no answer Relief from Meds: no answer  Mobility use a walker how many minutes can you walk? 2 ability to climb steps?  no do you drive?  no use a wheelchair transfers alone  Function disabled: date disabled . I need assistance with the following:  bathing, household duties and shopping  Neuro/Psych weakness tremor tingling trouble walking spasms  Prior Studies Any changes since last visit?  no  Physicians involved in your care Any changes since last visit?  no   Family History  Problem Relation Age of Onset  . Heart attack Mother   . Hypertension Mother   . Hyperlipidemia Mother   . Hypertension Father   . Hyperlipidemia Father   . CVA Father   . Heart attack Sister   . Lung cancer Brother   . Heart attack Brother   . Ovarian cancer Sister   . Heart attack Sister   . CVA Sister    Social History   Social History  . Marital status: Single    Spouse name: N/A  . Number of children: N/A  . Years of education: N/A    Social History Main Topics  . Smoking status: Former Research scientist (life sciences)  . Smokeless tobacco: Never Used  . Alcohol use Yes     Comment: Occasional  . Drug use: Yes    Types: Cocaine, Marijuana     Comment: Past Hx  . Sexual activity: Not Asked   Other Topics Concern  . None   Social History Narrative  . None   Past Surgical History:  Procedure Laterality Date  . LITHOTRIPSY    . NASAL POLYP EXCISION     Past Medical History:  Diagnosis Date  . Chronic venous insufficiency   . Diabetes mellitus   . GERD (gastroesophageal reflux disease)   . Gout   . Hemiparesis ([place])   . Hyperlipidemia   . Hypertension   . Obesity   . Renal calculi   . Sleep apnea, obstructive   . Stroke ([place])    [id number]   BP (!) 159/90   Pulse 74   SpO2 94%   Opioid Risk Score:   Fall Risk Score:  `1  Depression screen PHQ 2/9  Depression screen [facility] 2/9 10/26/2016 09/28/2016  Decreased Interest 0 0  Down, Depressed, Hopeless 0 1  PHQ - 2 Score 0 1  Altered sleeping 1 -  Tired, decreased energy 1 -  Change in appetite 0 -  Feeling bad or failure about yourself  0 -  Trouble concentrating 0 -  Moving slowly or fidgety/restless 0 -  Suicidal thoughts 0 -  PHQ-9 Score 2 -  Difficult doing work/chores Not difficult at all -     Review of Systems  Constitutional: Negative.   HENT: Negative.   Eyes: Negative.   Respiratory: Positive for apnea.   Cardiovascular: Negative.   Gastrointestinal: Negative.   Endocrine: Negative.   Genitourinary: Negative.   Musculoskeletal: Positive for gait problem.       Spasms  Skin: Negative.   Allergic/Immunologic: Negative.   Neurological: Positive for tremors.       Tingling  Hematological: Negative.   Psychiatric/Behavioral: Negative.   All other systems reviewed and are negative.     Objective:   Physical Exam Constitutional: He appears well-developed and well-nourished. NAD. HENT: Normocephalic and atraumatic.  Eyes: EOMI. No discharge.    Cardiovascular: RRR. No JVD. Respiratory: CTA B. Unlabored.  GI: Soft. Bowel sounds are normal.   Musculoskeletal: No tenderness.  BLE with stasis changes and healed lesions.  Edema RLE > LLE and right hand.  Neurological: He is alert and oriented with some conufsion Right facial weakness  Able to follow commands without difficulty.  LUE/LLE: 5/5 RUE: 0/5, mAS wrist flexors 1+/4, finger flexors 2/4 RLE: HF 3/5 KE 3+/5 2-/5 ADF/PF, mAS knee flexors 1/4, ankle plantar flexors 2/4 Dysarthria Skin: Skin is warm and dry. Intact. Psychiatric: Tangential, verbose. Confused    Assessment & Plan:  61 y.o. 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    1. 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 HTN   Remains elevated  Pt to follow up with PCP for changes in meds  3. T2DM with neuropathy and nephropathy  Encouraged monitoring CBGs  Follow up with PCP for med adjustments if needed  4. Abnormality of gait  Cont therapies  Plan for power chair  >25 spent with patient, with >20 minutes spent counseling and educating regarding spasticity management

## 2016-10-27 NOTE — Therapy (Signed)
[facility] [address] [place], [place], [address] Phone: [phone]   Fax:  [phone]  Physical Therapy Treatment  Patient Details  Name: Eduardo Young MRN: [id number] Date of Birth: 1955-08-18 Referring Provider: Alger Simons, MD  Encounter Date: 10/26/2016      PT End of Session - 10/26/16 1454    Visit Number 8   Number of Visits 17   Date for PT Re-Evaluation 11/20/16   Authorization Type Medicare G-code & progress note every 10th visit   PT Start Time 1449   PT Stop Time 1530   PT Time Calculation (min) 41 min   Equipment Utilized During Treatment Gait belt   Activity Tolerance Patient tolerated treatment well   Behavior During Therapy [facility] for tasks assessed/performed;Anxious      Past Medical History:  Diagnosis Date  . Chronic venous insufficiency   . Diabetes mellitus   . GERD (gastroesophageal reflux disease)   . Gout   . Hemiparesis ([place])   . Hyperlipidemia   . Hypertension   . Obesity   . Renal calculi   . Sleep apnea, obstructive   . Stroke [facility])    [id number]    Past Surgical History:  Procedure Laterality Date  . LITHOTRIPSY    . NASAL POLYP EXCISION      There were no vitals filed for this visit.      Subjective Assessment - 10/26/16 1452    Subjective Saw Dr. Posey Pronto this am. Will be getting botox injections to right LE    Patient is accompained by: Family member   Pertinent History CVA with right hemiparesis 2008, chronic venous insufficiency, DM2, GERD, gout, HLD, HTN, morbid obesity, sleep apnea,    Limitations Standing;Walking;House hold activities   Patient Stated Goals To improve activity level to what he is capable of doing.    Currently in Pain? Yes   Pain Score 5    Pain Location Leg   Pain Orientation Right   Pain Descriptors / Indicators Throbbing;Aching;Sore   Pain Type Neuropathic pain;Chronic pain   Pain Onset More than a month ago   Pain Frequency  Constant   Aggravating Factors  movement, tightness   Pain Relieving Factors stretching, laying down            [facility] Adult PT Treatment/Exercise - 10/26/16 1521      Transfers   Transfers Sit to Stand;Stand to [employer] Transfers   Sit to Stand 5: Supervision;With upper extremity assist;From chair/3-in-1   Sit to Stand Details Verbal cues for sequencing;Verbal cues for technique;Verbal cues for precautions/safety;Verbal cues for safe use of DME/AE;Manual facilitation for weight shifting;Manual facilitation for weight bearing   Stand to Sit 5: Supervision;With upper extremity assist;To bed   Stand to Sit Details (indicate cue type and reason) Visual cues/gestures for sequencing;Verbal cues for sequencing;Verbal cues for precautions/safety;Verbal cues for safe use of DME/AE;Manual facilitation for weight bearing;Manual facilitation for weight shifting   Stand Pivot Transfers 5: Supervision   Stand Pivot Transfer Details (indicate cue type and reason) with hemiwalker pt transfered from wheelchair to mat table.     Neuro Re-ed    Neuro Re-ed Details  standing next to mat table with hemi walker and blue rocker AFO with T-strap donned: used mirror, tactile and verbal cues to work on pt shifting weight onto right LE. Had pt "bump" his hip to PTA hip to achieve equal LE weight position, then work on bringing shoulders/trunk into midline as well. From  here had pt keep hip contact with lateral weight shifting left<>right with minimal shifitng of right past midline in blocked practice; had pt work towards and achieve midline again as before with hip to hip contact: then worked on [employer] in blocked practice to pt fatigue with time between reps to reachieve weight shifting onto right leg, progressed to left LE fwd/bwd stepping in blocked practice once right LE weight shift was achieved.                                                                 PT Short Term Goals - 10/18/16 2048       PT SHORT TERM GOAL #1   Title Patient demonstrates understanding of initial HEP. (Target Date: 10/20/2016)   Baseline 10/18/16: met today in session   Status Achieved     PT SHORT TERM GOAL #2   Title Patient performs stand pivot transfer with AD with supervision.  (Target Date: 10/20/2016)   Baseline 10/18/16: met today in session   Status Achieved     PT SHORT TERM GOAL #3   Title Patient demonstrates weight shift onto right LE in stance with cueing.  (Target Date: 10/20/2016)   Baseline 10/18/16: continues to demo decreased weight shifting and needs max cues/encouragement to shift any weight onto right LE   Status Partially Met           PT Long Term Goals - 10/05/16 1354      PT LONG TERM GOAL #1   Title Patient and sister demonstrate understanding of ongoing HEP. (Target Date: 11/17/2016)   Time 8   Period Weeks   Status On-going     PT LONG TERM GOAL #2   Title Patient standing balance: looks over shoulders & up with UE support, manages clothes & reaches 5" without UE support modified independent.  (Target Date: 11/17/2016)   Time 8   Period Weeks   Status On-going     PT LONG TERM GOAL #3   Title Patient verbalizes recommendations for equipment to maximize independence and proper use. (Target Date: 11/17/2016)   Time 8   Period Weeks   Status On-going     PT LONG TERM GOAL #4   Title Patient ambulates 10' with AD modified independent. (Target Date: 11/17/2016)   Time 8   Period Weeks   Status On-going            Plan - 10/26/16 1454    Clinical Impression Statement Today's skilled session focused on increased right LE weight shifting and weight bearing with standing activities. Pt continues to be anxious with this, however when given time in a safe (guarded) enviroment he was able to shift his weight onto right leg to achieve midline and even cross midline. AFO with strap left on for OT session after PT session. Pt is making progress toward goals and should  benefit from continued PT to progress toward unmet goals.                           Clinical Impairments Affecting Rehab Potential CVA with right hemiparesis 2008, chronic venous insufficiency, DM2, GERD, gout, HLD, HTN, morbid obesity, sleep apnea,   PT Treatment/Interventions ADLs/Self Care Home Management;Moist Heat;Ultrasound;DME Instruction;Gait  training;Stair training;Functional mobility training;Therapeutic activities;Therapeutic exercise;Balance training;Neuromuscular re-education;Patient/family education;Wheelchair mobility training   PT Next Visit Plan Transfer and gait training; orthotic consult with orthotist   Consulted and Agree with Plan of Care Patient;Family member/caregiver   Family Member Consulted sister, Manuela Schwartz      Patient will benefit from skilled therapeutic intervention in order to improve the following deficits and impairments:  Abnormal gait, Decreased activity tolerance, Decreased balance, Decreased endurance, Decreased knowledge of precautions, Decreased knowledge of use of DME, Decreased mobility, Decreased safety awareness, Decreased strength, Impaired flexibility, Postural dysfunction, Pain  Visit Diagnosis: 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, unspecified cerebrovascular disease type ([place])  Unsteadiness on feet  Muscle weakness (generalized)  Abnormal posture  Other abnormalities of gait and mobility     Problem List Patient Active Problem List   Diagnosis Date Noted  . Adjustment disorder with mixed anxiety and depressed mood   . Benign essential HTN   . Type 2 diabetes mellitus with peripheral neuropathy (HCC)   . Stage 3 chronic kidney disease   . Spastic hemiparesis of right dominant side ([place]) 09/08/2016  . Weakness   . Myoclonus   . Seizures ([place])   . TIA (transient ischemic attack) 09/07/2016  . Chronic venous insufficiency   . History of cerebral infarction ([place]) 10/21/2010  . DM (diabetes mellitus)  ([place]) 10/21/2010  . HLD (hyperlipidemia) 10/21/2010  . HTN (hypertension) 10/21/2010  . Sleep apnea 10/21/2010    Willow Ora, PTA, [facility] [address] [place], [place] [address] [phone] 10/27/16, 7:51 PM   Name: Eduardo Young MRN: [id number] Date of Birth: Jun 19, 1956

## 2016-10-30 ENCOUNTER — Encounter: Payer: Self-pay | Admitting: Occupational Therapy

## 2016-10-30 ENCOUNTER — Encounter: Payer: Self-pay | Admitting: Physical Therapy

## 2016-10-30 ENCOUNTER — Ambulatory Visit: Payer: Medicare Other | Admitting: Occupational Therapy

## 2016-10-30 ENCOUNTER — Ambulatory Visit: Payer: Medicare Other | Admitting: Physical Therapy

## 2016-10-30 DIAGNOSIS — R293 Abnormal posture: Secondary | ICD-10-CM

## 2016-10-30 DIAGNOSIS — M6281 Muscle weakness (generalized): Secondary | ICD-10-CM

## 2016-10-30 DIAGNOSIS — R2681 Unsteadiness on feet: Secondary | ICD-10-CM

## 2016-10-30 DIAGNOSIS — R2689 Other abnormalities of gait and mobility: Secondary | ICD-10-CM | POA: Diagnosis not present

## 2016-10-30 DIAGNOSIS — M79641 Pain in right hand: Secondary | ICD-10-CM

## 2016-10-30 DIAGNOSIS — G8113 Spastic hemiplegia affecting right nondominant side: Secondary | ICD-10-CM

## 2016-10-30 DIAGNOSIS — I679 Cerebrovascular disease, unspecified: Principal | ICD-10-CM

## 2016-10-30 DIAGNOSIS — R208 Other disturbances of skin sensation: Secondary | ICD-10-CM

## 2016-10-30 NOTE — Therapy (Signed)
[facility] [address] [place], [place], [address] Phone: [phone]   Fax:  [phone]  Physical Therapy Treatment  Patient Details  Name: Eduardo Young MRN: [id number] Date of Birth: [birth year] Referring Provider: Alger Simons, MD  Encounter Date: 10/30/2016      PT End of Session - 10/30/16 2152    Visit Number 9   Number of Visits 17   Date for PT Re-Evaluation 11/20/16   Authorization Type Medicare G-code & progress note every 10th visit   PT Start Time 1315   PT Stop Time 1400   PT Time Calculation (min) 45 min   Equipment Utilized During Treatment Gait belt   Activity Tolerance Patient tolerated treatment well   Behavior During Therapy [facility] for tasks assessed/performed;Anxious      Past Medical History:  Diagnosis Date  . Chronic venous insufficiency   . Diabetes mellitus   . GERD (gastroesophageal reflux disease)   . Gout   . Hemiparesis ([place])   . Hyperlipidemia   . Hypertension   . Obesity   . Renal calculi   . Sleep apnea, obstructive   . Stroke [facility])    [id number]    Past Surgical History:  Procedure Laterality Date  . LITHOTRIPSY    . NASAL POLYP EXCISION      There were no vitals filed for this visit.      Subjective Assessment - 10/30/16 1523    Subjective Pt does not report any changes or falls since last visit.    Pertinent History CVA with right hemiparesis 2008, chronic venous insufficiency, DM2, GERD, gout, HLD, HTN, morbid obesity, sleep apnea,    Limitations Standing;Walking;House hold activities   Patient Stated Goals To improve activity level to what he is capable of doing.    Currently in Pain? No/denies   Pain Onset More than a month ago     Orthotic Training: PT donned Blue Rocker AFO with T-strap. Next session plan to use [place] [employer] AFO with T-strap. Standing in parallel bars with tactile & verbal cues for weight shift to RLE.  Pt ambulated 5' X 3 in  parallel bars with minA. Pt advances RLE 4' with step-to LLE. Turning [place] in parallel bars with tactile & verbal cues for weight shift to RLE in stance.  Neuromuscular Reeducation:  Sitting balance on 24" bar stool in parallel bars with tactile & verbal cues: forward flexion, back extension, rotation & side bend.  Sitting at edge of w/c using back of w/c for support: Pt able to lean back with eccentric control and concentric recovery. Improved ability to engage abdominals.                     [facility] Adult PT Treatment/Exercise - 10/30/16 1315      Transfers   Transfers Sit to Stand;Stand to Sit   Sit to Stand 5: Supervision;With upper extremity assist;From chair/3-in-1   Sit to Stand Details Verbal cues for sequencing;Verbal cues for technique;Verbal cues for precautions/safety;Verbal cues for safe use of DME/AE;Manual facilitation for weight shifting;Manual facilitation for weight bearing   Stand to Sit 5: Supervision;With upper extremity assist;To chair/3-in-1   Stand to Sit Details (indicate cue type and reason) Visual cues/gestures for sequencing;Verbal cues for sequencing;Verbal cues for precautions/safety;Verbal cues for safe use of DME/AE;Manual facilitation for weight bearing;Manual facilitation for weight shifting   Stand Pivot Transfers --     Neuro Re-ed    Neuro Re-ed Details  (  P)  standing in // bars pt requires demo and cueing for weight shift onto RLE. Had pt turn 360 degrees in // bars to work on weight shifting onto RLE. Pt shifts weight with more ease when move RLE backwards when pivoting to turn.  In // bars, pt sat on stool while performing head turns and leaning (sagittal and frontal planes)                  PT Short Term Goals - 10/18/16 2048      PT SHORT TERM GOAL #1   Title Patient demonstrates understanding of initial HEP. (Target Date: 10/20/2016)   Baseline 10/18/16: met today in session   Status Achieved     PT SHORT TERM GOAL  #2   Title Patient performs stand pivot transfer with AD with supervision.  (Target Date: 10/20/2016)   Baseline 10/18/16: met today in session   Status Achieved     PT SHORT TERM GOAL #3   Title Patient demonstrates weight shift onto right LE in stance with cueing.  (Target Date: 10/20/2016)   Baseline 10/18/16: continues to demo decreased weight shifting and needs max cues/encouragement to shift any weight onto right LE   Status Partially Met           PT Long Term Goals - 10/05/16 1354      PT LONG TERM GOAL #1   Title Patient and sister demonstrate understanding of ongoing HEP. (Target Date: 11/17/2016)   Time 8   Period Weeks   Status On-going     PT LONG TERM GOAL #2   Title Patient standing balance: looks over shoulders & up with UE support, manages clothes & reaches 5" without UE support modified independent.  (Target Date: 11/17/2016)   Time 8   Period Weeks   Status On-going     PT LONG TERM GOAL #3   Title Patient verbalizes recommendations for equipment to maximize independence and proper use. (Target Date: 11/17/2016)   Time 8   Period Weeks   Status On-going     PT LONG TERM GOAL #4   Title Patient ambulates 10' with AD modified independent. (Target Date: 11/17/2016)   Time 8   Period Weeks   Status On-going               Plan - 10/30/16 2153    Clinical Impression Statement Patient was able to improve RLE stance stability with AFO with improved LLE clearance in swing.    Clinical Impairments Affecting Rehab Potential CVA with right hemiparesis 2008, chronic venous insufficiency, DM2, GERD, gout, HLD, HTN, morbid obesity, sleep apnea,   PT Frequency 2x / week   PT Duration 8 weeks   PT Treatment/Interventions ADLs/Self Care Home Management;Moist Heat;Ultrasound;DME Instruction;Gait training;Stair training;Functional mobility training;Therapeutic activities;Therapeutic exercise;Balance training;Neuromuscular re-education;Patient/family education;Wheelchair  mobility training   PT Next Visit Plan Orthotic consult with orthotist next session. Do G-code with gait.    Consulted and Agree with Plan of Care Patient      Patient will benefit from skilled therapeutic intervention in order to improve the following deficits and impairments:  Abnormal gait, Decreased activity tolerance, Decreased balance, Decreased endurance, Decreased knowledge of precautions, Decreased knowledge of use of DME, Decreased mobility, Decreased safety awareness, Decreased strength, Impaired flexibility, Postural dysfunction, Pain  Visit Diagnosis: 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, unspecified cerebrovascular disease type (HCC)  Unsteadiness on feet  Muscle weakness (generalized)  Abnormal posture  Other abnormalities of gait  and mobility     Problem List Patient Active Problem List   Diagnosis Date Noted  . Adjustment disorder with mixed anxiety and depressed mood   . Benign essential HTN   . Type 2 diabetes mellitus with peripheral neuropathy (HCC)   . Stage 3 chronic kidney disease   . Spastic hemiparesis of right dominant side ([place]) 09/08/2016  . Weakness   . Myoclonus   . Seizures ([place])   . TIA (transient ischemic attack) 09/07/2016  . Chronic venous insufficiency   . History of cerebral infarction ([place]) 10/21/2010  . DM (diabetes mellitus) ([place]) 10/21/2010  . HLD (hyperlipidemia) 10/21/2010  . HTN (hypertension) 10/21/2010  . Sleep apnea 10/21/2010    Jamey Reas PT, DPT 10/30/2016, 9:57 PM  [facility] [address] [place], [place], [address] Phone: [phone]   Fax:  [phone]  Name: Eduardo Young MRN: [id number] Date of Birth: Nov 21, 1955

## 2016-10-30 NOTE — Therapy (Signed)
[facility] [address]  [place], [place], [address] Phone: [phone]   Fax:  [phone]  Occupational Therapy Treatment  Patient Details  Name: Eduardo Young MRN: [id number] Date of Birth: 09-28-1955 Referring Provider: Dr. Naaman Plummer  Encounter Date: 10/30/2016      OT End of Session - 10/30/16 1658    Visit Number 8   Number of Visits 16   Date for OT Re-Evaluation 11/16/16   Authorization Type medicare will need G code and PN every 10th visit   Authorization Time Period 60 days   Authorization - Visit Number 8   Authorization - Number of Visits 10   OT Start Time 1402   OT Stop Time 5701   OT Time Calculation (min) 43 min      Past Medical History:  Diagnosis Date  . Chronic venous insufficiency   . Diabetes mellitus   . GERD (gastroesophageal reflux disease)   . Gout   . Hemiparesis ([place])   . Hyperlipidemia   . Hypertension   . Obesity   . Renal calculi   . Sleep apnea, obstructive   . Stroke [facility])    [id number]    Past Surgical History:  Procedure Laterality Date  . LITHOTRIPSY    . NASAL POLYP EXCISION      There were no vitals filed for this visit.      Subjective Assessment - 10/30/16 1409    Subjective  Let's go!   Pertinent History see epic. Pt with initial stroke in 2008, recently admitted to hospital thru ED on 09/07/2016 pt experiencing increased weakness in RLE - CT negative, overall pt has been declining over past year.   Patient Stated Goals to move around better    Currently in Pain? Yes   Pain Score 5    Pain Location Leg   Pain Orientation Right   Pain Descriptors / Indicators Aching;Sore;Throbbing   Pain Type Chronic pain;Neuropathic pain   Pain Onset More than a month ago   Pain Frequency Constant   Aggravating Factors  movement, tightness   Pain Relieving Factors stretching, laying down.                       OT Treatments/Exercises (OP) - 10/30/16 1652       Neurological Re-education Exercises   Other Exercises 1 Neuro re ed to address sit to stand, static standing balance with emphasis on midline alignment to activate RLE and trunk in standing, small lateral weght shifts, contolled dynamic standing balance with no UE support, functional mobility .  Pt is extremely fearful of falling, apraxic, has sensory and cognitive deficits therefore all activities taking place in supported area of parellel bars as pt feels more secure and is much more willing to work in standing within bars.  Pt with slowly improving basic mobility.                    OT Short Term Goals - 10/30/16 1656      OT SHORT TERM GOAL #1   Title Pt and sister will be mod I with HEP - 10/19/2016   Status Achieved     OT SHORT TERM GOAL #2   Title Pt and sister will be able to identify 2 strategies for normalizing tone   Status Achieved     OT SHORT TERM GOAL #3   Title Pt and sister will be mod I with splint wear and care  Status Achieved     OT SHORT TERM GOAL #4   Title Pt and sister will be mod I for UE positioing in wheelchair and in bed   Status Achieved           OT Long Term Goals - 10/30/16 1656      OT LONG TERM GOAL #1   Title Pt and sister will be mod I with ugraded HEP PRN- 11/16/2016   Status On-going     OT LONG TERM GOAL #2   Title Pt and sister will be mod I with edema control techniques for RUE   Status Achieved     OT LONG TERM GOAL #3   Title Pt will demonstrate adequate balance for safety and increased ease with basic transfers in the home (toilet and bed)   Status On-going     OT LONG TERM GOAL #4   Title Pt will be able to demostrate adequate standing balance without UE to enage in simple ADL activities.    Status On-going               Plan - 10/30/16 1656    Clinical Impression Statement Pt with slow progress toward goals. Pt is gaining in confidence for sit to stand, standing balance and basic mobility with  trial AFO. See PT note for info on trial brace.    Rehab Potential Fair   Clinical Impairments Affecting Rehab Potential severity of deficits, length of time since onset   OT Frequency 2x / week   OT Duration 8 weeks   OT Treatment/Interventions Self-care/ADL training;Therapeutic exercise;Neuromuscular education;DME and/or AE instruction;Passive range of motion;Manual Therapy;Functional Mobility Training;Splinting;Therapeutic activities;Patient/family education;Balance training   Plan NMR for trunk, RUE, balance, functional mobility, activity tolerance.    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 Adjustment disorder with mixed anxiety and depressed mood   . Benign essential HTN   . Type 2 diabetes mellitus with peripheral neuropathy (HCC)   . Stage 3 chronic kidney disease   . Spastic hemiparesis of right dominant side ([place]) 09/08/2016  . Weakness   . Myoclonus   . Seizures ([place])   . TIA (transient ischemic attack) 09/07/2016  . Chronic venous insufficiency   . History of cerebral infarction ([place]) 10/21/2010  . DM (diabetes mellitus) ([place]) 10/21/2010  . HLD (hyperlipidemia) 10/21/2010  . HTN (hypertension) 10/21/2010  . Sleep apnea 10/21/2010    [name], OTR/L 10/30/2016, 4:59  PM  [facility] [address] [place], [place], [address] Phone: [phone]   Fax:  [phone]  Name: Eduardo Young MRN: [id number] Date of Birth: [birth year]

## 2016-10-31 ENCOUNTER — Encounter: Payer: Self-pay | Admitting: Physical Therapy

## 2016-10-31 ENCOUNTER — Ambulatory Visit: Payer: Medicare Other | Admitting: Physical Therapy

## 2016-10-31 ENCOUNTER — Ambulatory Visit: Payer: Medicare Other | Admitting: Occupational Therapy

## 2016-10-31 DIAGNOSIS — R2681 Unsteadiness on feet: Secondary | ICD-10-CM | POA: Diagnosis not present

## 2016-10-31 DIAGNOSIS — G8113 Spastic hemiplegia affecting right nondominant side: Secondary | ICD-10-CM

## 2016-10-31 DIAGNOSIS — I679 Cerebrovascular disease, unspecified: Secondary | ICD-10-CM | POA: Diagnosis not present

## 2016-10-31 DIAGNOSIS — M6281 Muscle weakness (generalized): Secondary | ICD-10-CM | POA: Diagnosis not present

## 2016-10-31 DIAGNOSIS — R2689 Other abnormalities of gait and mobility: Secondary | ICD-10-CM

## 2016-10-31 DIAGNOSIS — M79641 Pain in right hand: Secondary | ICD-10-CM

## 2016-10-31 DIAGNOSIS — R293 Abnormal posture: Secondary | ICD-10-CM | POA: Diagnosis not present

## 2016-10-31 DIAGNOSIS — R208 Other disturbances of skin sensation: Secondary | ICD-10-CM

## 2016-10-31 NOTE — Therapy (Signed)
[facility] [address] [place], [place], [address] Phone: [phone]   Fax:  [phone]  Occupational Therapy Treatment  Patient Details  Name: Eduardo Young MRN: [id number] Date of Birth: 1956-05-07 Referring Provider: Dr. Naaman Plummer  Encounter Date: 10/31/2016      OT End of Session - 10/31/16 1604    Visit Number 9   Number of Visits 16   Date for OT Re-Evaluation 11/16/16   Authorization Type medicare will need G code and PN every 10th visit   Authorization Time Period 60 days   Authorization - Visit Number 9   Authorization - Number of Visits 10   OT Start Time 1446   OT Stop Time 1530   OT Time Calculation (min) 44 min   Activity Tolerance Patient tolerated treatment well      Past Medical History:  Diagnosis Date  . Chronic venous insufficiency   . Diabetes mellitus   . GERD (gastroesophageal reflux disease)   . Gout   . Hemiparesis ([place])   . Hyperlipidemia   . Hypertension   . Obesity   . Renal calculi   . Sleep apnea, obstructive   . Stroke [facility])    [id number]    Past Surgical History:  Procedure Laterality Date  . LITHOTRIPSY    . NASAL POLYP EXCISION      There were no vitals filed for this visit.      Subjective Assessment - 10/31/16 1451    Subjective  I will try whatever you want me to do   Pertinent History see epic. Pt with initial stroke in 2008, recently admitted to hospital thru ED on 09/07/2016 pt experiencing increased weakness in RLE - CT negative, overall pt has been declining over past year.   Patient Stated Goals to move around better    Currently in Pain? Yes   Pain Score 5    Pain Location Leg   Pain Orientation Right   Pain Descriptors / Indicators Aching;Sore;Throbbing   Pain Type Chronic pain;Neuropathic pain   Pain Onset More than a month ago   Pain Frequency Constant   Aggravating Factors  movement, tightness   Pain Relieving Factors stretching, laying down                       OT Treatments/Exercises (OP) - 10/31/16 0001      Neurological Re-education Exercises   Other Exercises 1 Neuro re ed to address sit to stand, static and dynamic standing balance, addressing standing balance while completing task briefly with LUE, and basic functional mobility. Pt with growing trust and decreasing fear with repetition and practice - pt able to use hemi walker and loaner AFO to take 5 steps with min a and max cues.  Pt slowly improving.  Basic functional mobility is crucial for this patient in order to reduce fall risk as pt cannot access bathroom or bedroom via wheelchair.                    OT Short Term Goals - 10/31/16 1601      OT SHORT TERM GOAL #1   Title Pt and sister will be mod I with HEP - 10/19/2016   Status Achieved     OT SHORT TERM GOAL #2   Title Pt and sister will be able to identify 2 strategies for normalizing tone   Status Achieved     OT SHORT TERM GOAL #3  Title Pt and sister will be mod I with splint wear and care   Status Achieved     OT SHORT TERM GOAL #4   Title Pt and sister will be mod I for UE positioing in wheelchair and in bed   Status Achieved           OT Long Term Goals - 10/31/16 1602      OT LONG TERM GOAL #1   Title Pt and sister will be mod I with ugraded HEP PRN- 11/16/2016   Status On-going     OT LONG TERM GOAL #2   Title Pt and sister will be mod I with edema control techniques for RUE   Status Achieved     OT LONG TERM GOAL #3   Title Pt will demonstrate adequate balance for safety and increased ease with basic transfers in the home (toilet and bed)   Status On-going     OT LONG TERM GOAL #4   Title Pt will be able to demostrate adequate standing balance without UE to enage in simple ADL activities.    Status On-going               Plan - 10/31/16 1602    Clinical Impression Statement Pt continues to make slow but very steady progress especially related  to balance and functional mobility.  Pt's mobility is improving with repetition and as fear decreases and confidence improves.   Rehab Potential Fair   Clinical Impairments Affecting Rehab Potential severity of deficits, length of time since onset   OT Frequency 2x / week   OT Duration 8 weeks   OT Treatment/Interventions Self-care/ADL training;Therapeutic exercise;Neuromuscular education;DME and/or AE instruction;Passive range of motion;Manual Therapy;Functional Mobility Training;Splinting;Therapeutic activities;Patient/family education;Balance training   Plan NMR for trunk, RUE, balance, functional mobility, activity tolerance.  Try to practice toilet transfer using loaner AFO and hemiwalker.   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 Adjustment disorder with mixed anxiety and depressed mood   . Benign essential HTN   . Type 2 diabetes mellitus with peripheral neuropathy (HCC)   . Stage 3 chronic kidney disease   . Spastic hemiparesis of right dominant side ([place]) 09/08/2016  . Weakness   . Myoclonus   . Seizures ([place])   . TIA (transient ischemic attack) 09/07/2016  . Chronic venous insufficiency   . History of cerebral infarction ([place]) 10/21/2010  . DM (diabetes mellitus) ([place]) 10/21/2010  . HLD  (hyperlipidemia) 10/21/2010  . HTN (hypertension) 10/21/2010  . Sleep apnea 10/21/2010    [name], OTR/L 10/31/2016, 4:06 PM  [facility] [address] [place], [place], [address] Phone: [phone]   Fax:  [phone]  Name: Eduardo Young MRN: [id number] Date of Birth: [birth year]

## 2016-11-01 ENCOUNTER — Telehealth: Payer: Self-pay | Admitting: Physical Therapy

## 2016-11-01 NOTE — Therapy (Signed)
[facility] [address] [place], [place], [address] Phone: [phone]   Fax:  [phone]  Physical Therapy Treatment  Patient Details  Name: Eduardo Young MRN: [id number] Date of Birth: 1956/07/22 Referring Provider: Alger Simons, MD  Encounter Date: 10/31/2016      PT End of Session - 11/01/16 1619    Visit Number 10   Number of Visits 17   Date for PT Re-Evaluation 11/20/16   Authorization Type Medicare G-code & progress note every 10th visit   PT Start Time 1530   PT Stop Time 1616   PT Time Calculation (min) 46 min   Equipment Utilized During Treatment Gait belt   Activity Tolerance Patient tolerated treatment well;Patient limited by fatigue   Behavior During Therapy [facility] for tasks assessed/performed;Anxious      Past Medical History:  Diagnosis Date  . Chronic venous insufficiency   . Diabetes mellitus   . GERD (gastroesophageal reflux disease)   . Gout   . Hemiparesis ([place])   . Hyperlipidemia   . Hypertension   . Obesity   . Renal calculi   . Sleep apnea, obstructive   . Stroke [facility])    [id number]    Past Surgical History:  Procedure Laterality Date  . LITHOTRIPSY    . NASAL POLYP EXCISION      There were no vitals filed for this visit.      Subjective Assessment - 10/31/16 1530    Subjective No falls.    Pertinent History CVA with right hemiparesis 2008, chronic venous insufficiency, DM2, GERD, gout, HLD, HTN, morbid obesity, sleep apnea,    Limitations Standing;Walking;House hold activities   Patient Stated Goals To improve activity level to what he is capable of doing.    Currently in Pain? Yes   Pain Score 5    Pain Location Leg   Pain Orientation Right   Pain Descriptors / Indicators Aching;Throbbing   Pain Type Chronic pain;Neuropathic pain   Pain Frequency Constant   Aggravating Factors  movement, tightness   Pain Relieving Factors stretching, laying down   Effect of Pain  on Daily Activities limits sitting up      Orthotic Training with Ottobock Reaction (Medial strut) with T-strap to control supination:  Patient ambulated 6' with counter on pt's right side for sense of security for patient with hemiwalker with minA with tactile & verbal cues for weight shift over RLE in stance. Patient ambulated 5' X 2 without counter on right side with minA transitioning from tile to carpet on first walk & carpet to tile on 2nd walk with anxiety at transition but increased time was able to overcome.  Stand-pivot transfer with hemiwalker with minA & increased time.                              PT Short Term Goals - 10/18/16 2048      PT SHORT TERM GOAL #1   Title Patient demonstrates understanding of initial HEP. (Target Date: 10/20/2016)   Baseline 10/18/16: met today in session   Status Achieved     PT SHORT TERM GOAL #2   Title Patient performs stand pivot transfer with AD with supervision.  (Target Date: 10/20/2016)   Baseline 10/18/16: met today in session   Status Achieved     PT SHORT TERM GOAL #3   Title Patient demonstrates weight shift onto right LE in stance with cueing.  (Target  Date: 10/20/2016)   Baseline 10/18/16: continues to demo decreased weight shifting and needs max cues/encouragement to shift any weight onto right LE   Status Partially Met           PT Long Term Goals - 10/05/16 1354      PT LONG TERM GOAL #1   Title Patient and sister demonstrate understanding of ongoing HEP. (Target Date: 11/17/2016)   Time 8   Period Weeks   Status On-going     PT LONG TERM GOAL #2   Title Patient standing balance: looks over shoulders & up with UE support, manages clothes & reaches 5" without UE support modified independent.  (Target Date: 11/17/2016)   Time 8   Period Weeks   Status On-going     PT LONG TERM GOAL #3   Title Patient verbalizes recommendations for equipment to maximize independence and proper use. (Target Date:  11/17/2016)   Time 8   Period Weeks   Status On-going     PT LONG TERM GOAL #4   Title Patient ambulates 10' with AD modified independent. (Target Date: 11/17/2016)   Time 8   Period Weeks   Status On-going               Plan - 2016/11/18 1620    Clinical Impression Statement Patient appears would benefit from Thermoplastic AFO solid ankle design to stabilize his ankle & knee in stance and clear his right foot in swing. Patient has improved step length bilaterally with improved clearance & stability.    Clinical Impairments Affecting Rehab Potential CVA with right hemiparesis 2008, chronic venous insufficiency, DM2, GERD, gout, HLD, HTN, morbid obesity, sleep apnea,   PT Frequency 2x / week   PT Duration 8 weeks   PT Treatment/Interventions ADLs/Self Care Home Management;Moist Heat;Ultrasound;DME Instruction;Gait training;Stair training;Functional mobility training;Therapeutic activities;Therapeutic exercise;Balance training;Neuromuscular re-education;Patient/family education;Wheelchair mobility training   PT Next Visit Plan gait with Ottobock reaction AFO with T-strap including 90* turns to sit   Consulted and Agree with Plan of Care Patient      Patient will benefit from skilled therapeutic intervention in order to improve the following deficits and impairments:  Abnormal gait, Decreased activity tolerance, Decreased balance, Decreased endurance, Decreased knowledge of precautions, Decreased knowledge of use of DME, Decreased mobility, Decreased safety awareness, Decreased strength, Impaired flexibility, Postural dysfunction, Pain  Visit Diagnosis: 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    Functional Limitation Mobility: Walking and moving around   Mobility: Walking and Moving Around Current Status [phone]) At least 80 percent but less than 100 percent impaired, limited or restricted   Mobility: Walking and Moving Around Goal Status [phone]) At least 60 percent but less than 80 percent impaired, limited or restricted      Problem List Patient Active Problem List   Diagnosis Date Noted  . Adjustment disorder with mixed anxiety and depressed mood   . Benign essential HTN   . Type 2 diabetes mellitus with peripheral neuropathy (HCC)   . Stage 3 chronic kidney disease   . Spastic hemiparesis of right dominant side ([place]) 09/08/2016  . Weakness   . Myoclonus   . Seizures ([place])   .  TIA (transient ischemic attack) 09/07/2016  . Chronic venous insufficiency   . History of cerebral infarction ([place]) 10/21/2010  . DM (diabetes mellitus) ([place]) 10/21/2010  . HLD (hyperlipidemia) 10/21/2010  . HTN (hypertension) 10/21/2010  . Sleep apnea 10/21/2010    Jamey Reas PT, DPT 11/01/2016, 4:28 PM  [facility] [address] [place], [place], [address] Phone: [phone]   Fax:  [phone]  Name: Eduardo Young MRN: [id number] Date of Birth: 03-Dec-1955

## 2016-11-01 NOTE — Telephone Encounter (Signed)
Eduardo Young needs a right AFO solid ankle design to stabilize ankle in stance and clearance in swing. He has flaccid right ankle all motions. His ankle has excessive supination and knee has recurvatum in stance, steppage gait with sever foot drop in swing and initial contact with forefoot & lateral foot (severe inversion / supination).  Please include in office visit details of need for AFO. Please FAX prescription and copy of office note with documented need to [facility]. Thank you Eduardo Young, PT, DPT PT Specializing in [place] @4 /05/2017@ 4:44 PM Phone:  [phone]  Fax:  [phone] [facility] [address] [place], [place] [address]

## 2016-11-01 NOTE — Telephone Encounter (Signed)
I believe this Dr. Serita Grit patient

## 2016-11-06 ENCOUNTER — Ambulatory Visit: Payer: Medicare Other | Admitting: Physical Therapy

## 2016-11-06 ENCOUNTER — Ambulatory Visit: Payer: Medicare Other | Admitting: Occupational Therapy

## 2016-11-07 ENCOUNTER — Encounter: Payer: Self-pay | Admitting: Registered Nurse

## 2016-11-07 ENCOUNTER — Encounter (HOSPITAL_BASED_OUTPATIENT_CLINIC_OR_DEPARTMENT_OTHER): Payer: Medicare Other | Admitting: Registered Nurse

## 2016-11-07 ENCOUNTER — Ambulatory Visit: Payer: Medicare Other | Admitting: Registered Nurse

## 2016-11-07 VITALS — BP 152/84 | HR 67

## 2016-11-07 DIAGNOSIS — E1142 Type 2 diabetes mellitus with diabetic polyneuropathy: Secondary | ICD-10-CM | POA: Diagnosis not present

## 2016-11-07 DIAGNOSIS — G8110 Spastic hemiplegia affecting unspecified side: Secondary | ICD-10-CM | POA: Diagnosis not present

## 2016-11-07 DIAGNOSIS — I10 Essential (primary) hypertension: Secondary | ICD-10-CM | POA: Diagnosis not present

## 2016-11-07 DIAGNOSIS — G253 Myoclonus: Secondary | ICD-10-CM

## 2016-11-07 DIAGNOSIS — E1121 Type 2 diabetes mellitus with diabetic nephropathy: Secondary | ICD-10-CM | POA: Diagnosis not present

## 2016-11-07 DIAGNOSIS — R269 Unspecified abnormalities of gait and mobility: Secondary | ICD-10-CM

## 2016-11-07 DIAGNOSIS — N183 Chronic kidney disease, stage 3 unspecified: Secondary | ICD-10-CM

## 2016-11-07 DIAGNOSIS — E1140 Type 2 diabetes mellitus with diabetic neuropathy, unspecified: Secondary | ICD-10-CM | POA: Diagnosis not present

## 2016-11-07 DIAGNOSIS — I69353 Hemiplegia and hemiparesis following cerebral infarction affecting right non-dominant side: Secondary | ICD-10-CM | POA: Diagnosis not present

## 2016-11-07 DIAGNOSIS — I69398 Other sequelae of cerebral infarction: Secondary | ICD-10-CM | POA: Diagnosis not present

## 2016-11-07 NOTE — Progress Notes (Signed)
Subjective:    Patient ID: Eduardo Young, male    DOB: [birth year], 61 y.o.   MRN: [id number]  HPI: Mr. Sabastien Tyler is a 61 year old male who is here for a Face to Wellsite geologist. I have reviewed the Physical Therapist Wheelchair Evaluation Form and Concur with their assessment and evaluation. The Physical Therapist recommends Power Recline on Power Base for positional accommodations for pressure relief.  Mr. Vandeusen has the Physical and Mental ability to operate the Power Wheelchair. The Power Wheelchair will be a great asset for Mr. Spratling. This will help with his mobility in his home, also improving his independence with activities of daily living, such as bathing, dressing and toileting.  Mr. Labarge weight noted on the Physical Therapy Evaluation on 10/09/2016 is 288.5lbs.   Mr. Hanway attending physical therapy twice a week.  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   Pain Inventory Average Pain 0 Pain Right Now 0 My pain is .  In the last 24 hours, has pain interfered with the following? General activity 0 Relation with others 0 Enjoyment of life 0 What TIME of day is your pain at its worst? . Sleep (in general) .  Pain is worse with: . Pain improves with: . Relief from Meds: .  Mobility use a wheelchair  Function disabled: date disabled .  Neuro/Psych No problems in this area  Prior Studies Any changes since last visit?  no  Physicians involved in your care Any changes since last visit?  no   Family History  Problem Relation Age of Onset  . Heart attack Mother   . Hypertension Mother   . Hyperlipidemia Mother   . Hypertension Father   . Hyperlipidemia Father   . CVA Father   . Heart attack Sister   . Lung cancer Brother   . Heart attack Brother   . Ovarian cancer Sister   . Heart attack Sister   . CVA Sister    Social History   Social History  . Marital status: Single    Spouse  name: N/A  . Number of children: N/A  . Years of education: N/A   Social History Main Topics  . Smoking status: Former Research scientist (life sciences)  . Smokeless tobacco: Never Used  . Alcohol use Yes     Comment: Occasional  . Drug use: Yes    Types: Cocaine, Marijuana     Comment: Past Hx  . Sexual activity: Not on file   Other Topics Concern  . Not on file   Social History Narrative  . No narrative on file   Past Surgical History:  Procedure Laterality Date  . LITHOTRIPSY    . NASAL POLYP EXCISION     Past Medical History:  Diagnosis Date  . Chronic venous insufficiency   . Diabetes mellitus   . GERD (gastroesophageal reflux disease)   . Gout   . Hemiparesis ([place])   . Hyperlipidemia   . Hypertension   . Obesity   . Renal calculi   . Sleep apnea, obstructive   . Stroke ([place])    [id number]   BP (!) 152/84   Pulse 67   SpO2 93%   Opioid Risk Score:   Fall Risk Score:  `1  Depression screen PHQ 2/9  Depression screen [facility] 2/9 10/26/2016 09/28/2016  Decreased Interest 0 0  Down, Depressed, Hopeless 0 1  PHQ - 2 Score 0 1  Altered sleeping 1 -  Tired, decreased energy 1 -  Change in appetite 0 -  Feeling bad or failure about yourself  0 -  Trouble concentrating 0 -  Moving slowly or fidgety/restless 0 -  Suicidal thoughts 0 -  PHQ-9 Score 2 -  Difficult doing work/chores Not difficult at all -     Review of Systems  Constitutional: Negative.   HENT: Negative.   Eyes: Negative.   Respiratory: Negative.   Cardiovascular: Negative.   Gastrointestinal: Negative.   Endocrine: Negative.   Genitourinary: Negative.   Musculoskeletal: Negative.   Skin: Negative.   Allergic/Immunologic: Negative.   Neurological: Negative.   Hematological: Negative.   Psychiatric/Behavioral: Negative.   All other systems reviewed and are negative.      Objective:   Physical Exam  Constitutional: He is oriented to person, place, and time. He appears well-developed and well-nourished.  HENT:    Head: Normocephalic and atraumatic.  Neck: Normal range of motion. Neck supple.  Cardiovascular: Normal rate and regular rhythm.   Pulmonary/Chest: Effort normal and breath sounds normal.  Musculoskeletal:  Normal Muscle Bulk and Muscle Testing Reveals: Upper Extremities: Right: Paralysis Left: Full ROM and Muscle Strength 5/5 Lower Extremities: Left: Full ROM and Muscle Strength 5/5 Right: Decreased ROM and Muscle Strength 4/5 Right Lower Extremity Flexion Produce Pain into Right Foot Arrived in wheelchair  Neurological: He is alert and oriented to person, place, and time.  Skin: Skin is warm and dry.  Psychiatric: He has a normal mood and affect.  Nursing note and vitals reviewed.         Assessment & Plan:  1. 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 Power Environmental health practitioner Today.  therapies Schedule for Botulinum toxin injection on 11/15/2016 2. HTN: PCP Following. 3. T2DM with neuropathy and nephropathy: PCP Following 4. Abnormality of gait: Continue Physical Therapy, Plan for Power Wheelchair.              30 minutes of face to face patient care time was spent during this visit. All questions were encouraged and answered.

## 2016-11-09 ENCOUNTER — Encounter: Payer: Self-pay | Admitting: Occupational Therapy

## 2016-11-09 ENCOUNTER — Encounter: Payer: Self-pay | Admitting: Physical Therapy

## 2016-11-09 ENCOUNTER — Ambulatory Visit: Payer: Medicare Other | Admitting: Occupational Therapy

## 2016-11-09 ENCOUNTER — Ambulatory Visit: Payer: Medicare Other | Admitting: Physical Therapy

## 2016-11-09 DIAGNOSIS — M6281 Muscle weakness (generalized): Secondary | ICD-10-CM | POA: Diagnosis not present

## 2016-11-09 DIAGNOSIS — R2689 Other abnormalities of gait and mobility: Secondary | ICD-10-CM | POA: Diagnosis not present

## 2016-11-09 DIAGNOSIS — R2681 Unsteadiness on feet: Secondary | ICD-10-CM

## 2016-11-09 DIAGNOSIS — M6249 Contracture of muscle, multiple sites: Secondary | ICD-10-CM

## 2016-11-09 DIAGNOSIS — I679 Cerebrovascular disease, unspecified: Secondary | ICD-10-CM | POA: Diagnosis not present

## 2016-11-09 DIAGNOSIS — R293 Abnormal posture: Secondary | ICD-10-CM

## 2016-11-09 DIAGNOSIS — I69353 Hemiplegia and hemiparesis following cerebral infarction affecting right non-dominant side: Secondary | ICD-10-CM

## 2016-11-09 DIAGNOSIS — R208 Other disturbances of skin sensation: Secondary | ICD-10-CM

## 2016-11-09 DIAGNOSIS — M79641 Pain in right hand: Secondary | ICD-10-CM

## 2016-11-09 DIAGNOSIS — G8113 Spastic hemiplegia affecting right nondominant side: Secondary | ICD-10-CM | POA: Diagnosis not present

## 2016-11-09 NOTE — Therapy (Signed)
[facility] [address] [place], [place], [address] Phone: [phone]   Fax:  [phone]  Occupational Therapy Treatment  Patient Details  Name: Eduardo Young MRN: [id number] Date of Birth: [birth year] Referring Provider: Dr. Naaman Plummer  Encounter Date: 11/09/2016      OT End of Session - 11/09/16 1606    Visit Number 10   Number of Visits 16   Date for OT Re-Evaluation 11/16/16   Authorization Type medicare will need G code and PN every 10th visit   Authorization Time Period 2   Authorization - Visit Number 10   Authorization - Number of Visits 10   OT Start Time 4585   OT Stop Time 1533   OT Time Calculation (min) 48 min   Activity Tolerance Patient tolerated treatment well   Behavior During Therapy [facility] for tasks assessed/performed      Past Medical History:  Diagnosis Date  . Chronic venous insufficiency   . Diabetes mellitus   . GERD (gastroesophageal reflux disease)   . Gout   . Hemiparesis ([place])   . Hyperlipidemia   . Hypertension   . Obesity   . Renal calculi   . Sleep apnea, obstructive   . Stroke [facility])    [id number]    Past Surgical History:  Procedure Laterality Date  . LITHOTRIPSY    . NASAL POLYP EXCISION      There were no vitals filed for this visit.      Subjective Assessment - 11/09/16 1600    Subjective  Getting stronger on my leg (right)   Pertinent History see epic. Pt with initial stroke in 2008, recently admitted to hospital thru ED on 09/07/2016 pt experiencing increased weakness in RLE - CT negative, overall pt has been declining over past year.   Patient Stated Goals to move around better    Currently in Pain? No/denies   Pain Score 0-No pain                      OT Treatments/Exercises (OP) - 11/09/16 1601      ADLs   Functional Mobility Worked on components of commode transfers, turning, freeing left hand from mobility task, backing up, etc.  Patient  able to step and turn in parallel bars with min assist initially, and cueing with subsequent attempts.  Patient able to turn toward right and left- did best with clear plan before starting task.  Worked outside of parallel bars to attempt stand step turn transfer to simulated commode chair.  Patient able to effectively turn in either direction - biggest challenge was having to slightly flex at hips and knees to reach lower armrest for chair.  Patient very pleased with his progress.                  OT Education - 11/09/16 1605    Education provided Yes   Education Details Reviewed equipment, and layout of bathroom as realted to bathroom transfers.     Person(s) Educated Patient   Methods Explanation;Demonstration   Comprehension Verbalized understanding;Returned demonstration;Need further instruction          OT Short Term Goals - 11/09/16 1608      OT SHORT TERM GOAL #1   Title Pt and sister will be mod I with HEP - 10/19/2016   Status Achieved     OT SHORT TERM GOAL #2   Title Pt and sister will be able to identify 2 strategies  for normalizing tone   Status Achieved     OT SHORT TERM GOAL #3   Title Pt and sister will be mod I with splint wear and care   Status Achieved     OT SHORT TERM GOAL #4   Title Pt and sister will be mod I for UE positioing in wheelchair and in bed   Status Achieved           OT Long Term Goals - 2016-12-01 1608      OT LONG TERM GOAL #1   Title Pt and sister will be mod I with ugraded HEP PRN- 11/16/2016   Status On-going     OT LONG TERM GOAL #2   Title Pt and sister will be mod I with edema control techniques for RUE   Status Achieved     OT LONG TERM GOAL #3   Title Pt will demonstrate adequate balance for safety and increased ease with basic transfers in the home (toilet and bed)     OT LONG TERM GOAL #4   Title Pt will be able to demostrate adequate standing balance without UE to enage in simple ADL activities.    Status  On-going               Plan - 2016-12-01 1606    Clinical Impression Statement Patient is showing steady gains related to functional mobility and balance.  Patient showing less fear and anxiety with mobility tasks.     Rehab Potential Fair   Clinical Impairments Affecting Rehab Potential severity of deficits, length of time since onset   OT Frequency 2x / week   OT Duration 8 weeks   OT Treatment/Interventions Self-care/ADL training;Therapeutic exercise;Neuromuscular education;DME and/or AE instruction;Passive range of motion;Manual Therapy;Functional Mobility Training;Splinting;Therapeutic activities;Patient/family education;Balance training   Plan NMR for trunk, RUE, Balance, functional mobility- try toilet transfer with hemiwalker (and AFO)   Consulted and Agree with Plan of Care Patient      Patient will benefit from skilled therapeutic intervention in order to improve the following deficits and impairments:  Abnormal gait, Decreased activity tolerance, Decreased balance, Decreased cognition, Decreased mobility, Decreased strength, Difficulty walking, Impaired UE functional use, Impaired tone, Impaired sensation, Pain  Visit Diagnosis: Unsteadiness on feet  Muscle weakness (generalized)  Abnormal posture  Pain in right hand  Other disturbances of skin sensation  Contracture of muscle, multiple sites  Hemiplegia and hemiparesis following cerebral infarction affecting right non-dominant side (HCC)      G-Codes - 12/01/2016 1610    Functional Assessment Tool Used (Outpatient only) clinical judgement   Functional Limitation Self care   Self Care Current Status (R1021) At least 40 percent but less than 60 percent impaired, limited or restricted   Self Care Goal Status (R1735) At least 20 percent but less than 40 percent impaired, limited or restricted      Problem List Patient Active Problem List   Diagnosis Date Noted  . Adjustment disorder with mixed anxiety and  depressed mood   . Benign essential HTN   . Type 2 diabetes mellitus with peripheral neuropathy (HCC)   . Stage 3 chronic kidney disease   . Spastic hemiparesis of right dominant side ([place]) 09/08/2016  . Weakness   . Myoclonus   . Seizures ([place])   . TIA (transient ischemic attack) 09/07/2016  . Chronic venous insufficiency   . History of cerebral infarction ([place]) 10/21/2010  . DM (diabetes mellitus) ([place]) 10/21/2010  . HLD (hyperlipidemia) 10/21/2010  . HTN (  hypertension) 10/21/2010  . Sleep apnea 10/21/2010   Occupational Therapy Progress Note  Dates of Reporting Period: 09/21/2016  to 11/09/2016  Objective Reports of Subjective Statement: Patient showing improved stand tolerance, static stand balance, and functional mobility  Goal Update: See above, all short term goals met- working toward long term goals  Plan: See above  Reason Skilled Services are Required:Patient is showing steady improvement with functional mobility, and has potential for increased independence and safety with ADL in home environment.     Mariah Milling, OTR/L 11/09/2016, 4:11 PM  [facility] [address] [place], [place], [address] Phone: [phone]   Fax:  [phone]  Name: Eduardo Young MRN: [id number] Date of Birth: [birth year]

## 2016-11-09 NOTE — Therapy (Signed)
[facility] [address] [place], [place], [address] Phone: [phone]   Fax:  [phone]  Physical Therapy Treatment  Patient Details  Name: Eduardo Young MRN: [id number] Date of Birth: [birth year] Referring Provider: Alger Simons, MD  Encounter Date: 11/09/2016      PT End of Session - 11/09/16 1412    Visit Number 11   Number of Visits 17   Date for PT Re-Evaluation 11/20/16   Authorization Type Medicare G-code & progress note every 10th visit   PT Start Time 1405   PT Stop Time 1443   PT Time Calculation (min) 38 min   Equipment Utilized During Treatment Gait belt   Activity Tolerance Patient tolerated treatment well;Patient limited by fatigue   Behavior During Therapy [facility] for tasks assessed/performed;Anxious      Past Medical History:  Diagnosis Date  . Chronic venous insufficiency   . Diabetes mellitus   . GERD (gastroesophageal reflux disease)   . Gout   . Hemiparesis ([place])   . Hyperlipidemia   . Hypertension   . Obesity   . Renal calculi   . Sleep apnea, obstructive   . Stroke [facility])    [id number]    Past Surgical History:  Procedure Laterality Date  . LITHOTRIPSY    . NASAL POLYP EXCISION      There were no vitals filed for this visit.      Subjective Assessment - 11/09/16 1410    Subjective No new falls. Reports his right shoulder started huring really bad about 2 days ago. Unsure of what the cause was. Feeling a little better today.    Patient is accompained by: Family member   Pertinent History CVA with right hemiparesis 2008, chronic venous insufficiency, DM2, GERD, gout, HLD, HTN, morbid obesity, sleep apnea,    Limitations Standing;Walking;House hold activities   Patient Stated Goals To improve activity level to what he is capable of doing.    Currently in Pain? Yes   Pain Score 6    Pain Location Shoulder   Pain Orientation Right   Pain Descriptors / Indicators  Aching;Throbbing   Pain Type Chronic pain   Pain Onset In the past 7 days   Pain Frequency Intermittent   Aggravating Factors  unknown   Pain Relieving Factors has not tired anything for pain           [facility] Adult PT Treatment/Exercise - 11/09/16 1413      Transfers   Transfers Sit to Stand;Stand to Sit   Sit to Stand 4: Min guard;5: Supervision;With upper extremity assist;From chair/3-in-1;With armrests   Sit to Stand Details Tactile cues for weight shifting;Verbal cues for safe use of DME/AE;Verbal cues for sequencing;Verbal cues for precautions/safety   Sit to Stand Details (indicate cue type and reason) cues to engage left LE with standing   Stand to Sit 5: Supervision;With upper extremity assist;4: Min guard;To chair/3-in-1;With armrests   Stand to Sit Details (indicate cue type and reason) Tactile cues for weight shifting;Verbal cues for sequencing;Verbal cues for technique;Verbal cues for safe use of DME/AE;Manual facilitation for weight shifting   Stand to Sit Details cues to engage left LE with sitting down     Ambulation/Gait   Ambulation/Gait Yes   Ambulation/Gait Assistance 4: Min assist   Ambulation/Gait Assistance Details multimodal cues for pt to shift weight forward/laterally onto right LE in stance to allow for increased left step length.  Pt progressed from stepping left foot just behind right foot  with advancement to stepping feet even with advancement over course of gait reps.                                 Ambulation Distance (Feet) --  5-7 feet x 3 reps   Assistive device Hemi-walker   Gait Pattern Step-to pattern;Decreased arm swing - right;Decreased step length - left;Decreased stance time - right;Decreased stride length;Decreased hip/knee flexion - right;Decreased dorsiflexion - right;Decreased weight shift to right;Right hip hike;Right steppage;Right foot flat;Lateral hip instability;Decreased trunk rotation;Trunk flexed;Abducted- right;Poor foot clearance -  right   Ambulation Surface Level;Indoor              PT Short Term Goals - 10/18/16 2048      PT SHORT TERM GOAL #1   Title Patient demonstrates understanding of initial HEP. (Target Date: 10/20/2016)   Baseline 10/18/16: met today in session   Status Achieved     PT SHORT TERM GOAL #2   Title Patient performs stand pivot transfer with AD with supervision.  (Target Date: 10/20/2016)   Baseline 10/18/16: met today in session   Status Achieved     PT SHORT TERM GOAL #3   Title Patient demonstrates weight shift onto right LE in stance with cueing.  (Target Date: 10/20/2016)   Baseline 10/18/16: continues to demo decreased weight shifting and needs max cues/encouragement to shift any weight onto right LE   Status Partially Met           PT Long Term Goals - 10/05/16 1354      PT LONG TERM GOAL #1   Title Patient and sister demonstrate understanding of ongoing HEP. (Target Date: 11/17/2016)   Time 8   Period Weeks   Status On-going     PT LONG TERM GOAL #2   Title Patient standing balance: looks over shoulders & up with UE support, manages clothes & reaches 5" without UE support modified independent.  (Target Date: 11/17/2016)   Time 8   Period Weeks   Status On-going     PT LONG TERM GOAL #3   Title Patient verbalizes recommendations for equipment to maximize independence and proper use. (Target Date: 11/17/2016)   Time 8   Period Weeks   Status On-going     PT LONG TERM GOAL #4   Title Patient ambulates 10' with AD modified independent. (Target Date: 11/17/2016)   Time 8   Period Weeks   Status On-going            Plan - 11/09/16 1412    Clinical Impression Statement Today's skilled session continued to focus on gait with AFO with T strap to left LE. Increased time needed to don brace today and position T-strap in good place for ankle control in stance. Remainder of session addressed gait with hemiwalker with emphasis on increased weight shifting onto right LE in  stance and increased left step length. Increased time needed with all mobility with pt quick to fatigue needing rest breaks after short gait distances. Pt is making steady progress toward goals and should benefit from continued PT to progress toward unmet goals.                            Clinical Impairments Affecting Rehab Potential CVA with right hemiparesis 2008, chronic venous insufficiency, DM2, GERD, gout, HLD, HTN, morbid obesity, sleep apnea,   PT Frequency 2x /  week   PT Duration 8 weeks   PT Treatment/Interventions ADLs/Self Care Home Management;Moist Heat;Ultrasound;DME Instruction;Gait training;Stair training;Functional mobility training;Therapeutic activities;Therapeutic exercise;Balance training;Neuromuscular re-education;Patient/family education;Wheelchair mobility training   PT Next Visit Plan with use of AFO with T-strap: work on stepping in parallel bars with emphasis on weight shifting/step length, right stance activites with emphasis on increased right weight bearing in standing.    Consulted and Agree with Plan of Care Patient      Patient will benefit from skilled therapeutic intervention in order to improve the following deficits and impairments:  Abnormal gait, Decreased activity tolerance, Decreased balance, Decreased endurance, Decreased knowledge of precautions, Decreased knowledge of use of DME, Decreased mobility, Decreased safety awareness, Decreased strength, Impaired flexibility, Postural dysfunction, Pain  Visit Diagnosis: Unsteadiness on feet  Muscle weakness (generalized)  Abnormal posture  Other abnormalities of gait and mobility     Problem List Patient Active Problem List   Diagnosis Date Noted  . Adjustment disorder with mixed anxiety and depressed mood   . Benign essential HTN   . Type 2 diabetes mellitus with peripheral neuropathy (HCC)   . Stage 3 chronic kidney disease   . Spastic hemiparesis of right dominant side ([place]) 09/08/2016  .  Weakness   . Myoclonus   . Seizures ([place])   . TIA (transient ischemic attack) 09/07/2016  . Chronic venous insufficiency   . History of cerebral infarction ([place]) 10/21/2010  . DM (diabetes mellitus) ([place]) 10/21/2010  . HLD (hyperlipidemia) 10/21/2010  . HTN (hypertension) 10/21/2010  . Sleep apnea 10/21/2010    Willow Ora, PTA, [facility] [address] [place], [place] [address] [phone] 11/09/16, 3:12 PM   Name: Eduardo Young MRN: [id number] Date of Birth: 08-06-1955

## 2016-11-10 ENCOUNTER — Ambulatory Visit: Payer: Medicare Other | Admitting: Physical Therapy

## 2016-11-10 ENCOUNTER — Ambulatory Visit: Payer: Medicare Other | Admitting: Occupational Therapy

## 2016-11-10 ENCOUNTER — Encounter: Payer: Self-pay | Admitting: Physical Therapy

## 2016-11-10 ENCOUNTER — Encounter: Payer: Self-pay | Admitting: Occupational Therapy

## 2016-11-10 DIAGNOSIS — R208 Other disturbances of skin sensation: Secondary | ICD-10-CM

## 2016-11-10 DIAGNOSIS — M6249 Contracture of muscle, multiple sites: Secondary | ICD-10-CM

## 2016-11-10 DIAGNOSIS — I679 Cerebrovascular disease, unspecified: Secondary | ICD-10-CM | POA: Diagnosis not present

## 2016-11-10 DIAGNOSIS — R293 Abnormal posture: Secondary | ICD-10-CM | POA: Diagnosis not present

## 2016-11-10 DIAGNOSIS — M79641 Pain in right hand: Secondary | ICD-10-CM

## 2016-11-10 DIAGNOSIS — R2681 Unsteadiness on feet: Secondary | ICD-10-CM | POA: Diagnosis not present

## 2016-11-10 DIAGNOSIS — M6281 Muscle weakness (generalized): Secondary | ICD-10-CM

## 2016-11-10 DIAGNOSIS — G8113 Spastic hemiplegia affecting right nondominant side: Secondary | ICD-10-CM | POA: Diagnosis not present

## 2016-11-10 DIAGNOSIS — R2689 Other abnormalities of gait and mobility: Secondary | ICD-10-CM

## 2016-11-10 DIAGNOSIS — I69353 Hemiplegia and hemiparesis following cerebral infarction affecting right non-dominant side: Secondary | ICD-10-CM

## 2016-11-10 NOTE — Therapy (Signed)
[facility] [address] [place], [place], [address] Phone: [phone]   Fax:  [phone]  Occupational Therapy Treatment  Patient Details  Name: Eduardo Young MRN: [id number] Date of Birth: October 24, 1955 Referring Provider: Dr. Naaman Plummer  Encounter Date: 11/10/2016      OT End of Session - 11/10/16 1338    Visit Number 11   Number of Visits 16   Date for OT Re-Evaluation 11/16/16   Authorization Type medicare will need G code and PN every 10th visit   Authorization Time Period 50   Authorization - Visit Number 11   Authorization - Number of Visits 20   OT Start Time 1236  patient arrived late   OT Stop Time 1315   OT Time Calculation (min) 39 min   Activity Tolerance Patient tolerated treatment well   Behavior During Therapy [facility] for tasks assessed/performed      Past Medical History:  Diagnosis Date  . Chronic venous insufficiency   . Diabetes mellitus   . GERD (gastroesophageal reflux disease)   . Gout   . Hemiparesis ([place])   . Hyperlipidemia   . Hypertension   . Obesity   . Renal calculi   . Sleep apnea, obstructive   . Stroke [facility])    [id number]    Past Surgical History:  Procedure Laterality Date  . LITHOTRIPSY    . NASAL POLYP EXCISION      There were no vitals filed for this visit.      Subjective Assessment - 11/10/16 1334    Subjective  Doing good   Patient is accompained by: Family member   Pertinent History see epic. Pt with initial stroke in 2008, recently admitted to hospital thru ED on 09/07/2016 pt experiencing increased weakness in RLE - CT negative, overall pt has been declining over past year.   Patient Stated Goals to move around better    Currently in Pain? No/denies   Pain Score 0-No pain                      OT Treatments/Exercises (OP) - 11/10/16 0001      ADLs   Functional Mobility Worked  on components of bathroom transfers.  Initially attempted to walk  with hemiwalker in open space, patient anxious, having difficulty with weight shift onto right leg for step thru pattern in left.  Patient did better in parallel bars (security) able to step through, and turn with no more than minimal assistance, often stand by supervision.  Discussed with patient the importance of transitioning to open area- more like bathroom for commode transfers.                  OT Education - 11/10/16 1337    Education provided Yes   Education Details commode transfers   Person(s) Educated Patient   Methods Explanation;Demonstration   Comprehension Verbalized understanding;Returned demonstration;Need further instruction          OT Short Term Goals - 11/09/16 1608      OT SHORT TERM GOAL #1   Title Pt and sister will be mod I with HEP - 10/19/2016   Status Achieved     OT SHORT TERM GOAL #2   Title Pt and sister will be able to identify 2 strategies for normalizing tone   Status Achieved     OT SHORT TERM GOAL #3   Title Pt and sister will be mod I with splint wear and care  Status Achieved     OT SHORT TERM GOAL #4   Title Pt and sister will be mod I for UE positioing in wheelchair and in bed   Status Achieved           OT Long Term Goals - 12-04-16 1608      OT LONG TERM GOAL #1   Title Pt and sister will be mod I with ugraded HEP PRN- 11/16/2016   Status On-going     OT LONG TERM GOAL #2   Title Pt and sister will be mod I with edema control techniques for RUE   Status Achieved     OT LONG TERM GOAL #3   Title Pt will demonstrate adequate balance for safety and increased ease with basic transfers in the home (toilet and bed)     OT LONG TERM GOAL #4   Title Pt will be able to demostrate adequate standing balance without UE to enage in simple ADL activities.    Status On-going               Plan - 11/10/16 1339    Clinical Impression Statement Patient continues to show improved functional mobility due to improved  confidence and decreased fear with rehab process.   Rehab Potential Fair   Clinical Impairments Affecting Rehab Potential severity of deficits, length of time since onset   OT Frequency 2x / week   OT Duration 8 weeks   OT Treatment/Interventions Self-care/ADL training;Therapeutic exercise;Neuromuscular education;DME and/or AE instruction;Passive range of motion;Manual Therapy;Functional Mobility Training;Splinting;Therapeutic activities;Patient/family education;Balance training   Plan Continue with functional mobility, commode transfers - patient bringing his own walker to next session   Consulted and Agree with Plan of Care Patient   Family Member Consulted sister- Manuela Schwartz      Patient will benefit from skilled therapeutic intervention in order to improve the following deficits and impairments:  Abnormal gait, Decreased activity tolerance, Decreased balance, Decreased cognition, Decreased mobility, Decreased strength, Difficulty walking, Impaired UE functional use, Impaired tone, Impaired sensation, Pain  Visit Diagnosis: Unsteadiness on feet  Muscle weakness (generalized)  Abnormal posture  Pain in right hand  Other disturbances of skin sensation  Contracture of muscle, multiple sites  Hemiplegia and hemiparesis following cerebral infarction affecting right non-dominant side (HCC)      G-Codes - Dec 04, 2016 1610    Functional Assessment Tool Used (Outpatient only) clinical judgement   Functional Limitation Self care   Self Care Current Status (A3557) At least 40 percent but less than 60 percent impaired, limited or restricted   Self Care Goal Status (D2202) At least 20 percent but less than 40 percent impaired, limited or restricted      Problem List Patient Active Problem List   Diagnosis Date Noted  . Adjustment disorder with mixed anxiety and depressed mood   . Benign essential HTN   . Type 2 diabetes mellitus with peripheral neuropathy (HCC)   . Stage 3 chronic kidney  disease   . Spastic hemiparesis of right dominant side ([place]) 09/08/2016  . Weakness   . Myoclonus   . Seizures ([place])   . TIA (transient ischemic attack) 09/07/2016  . Chronic venous insufficiency   . History of cerebral infarction ([place]) 10/21/2010  . DM (diabetes mellitus) ([place]) 10/21/2010  . HLD (hyperlipidemia) 10/21/2010  . HTN (hypertension) 10/21/2010  . Sleep apnea 10/21/2010    Mariah Milling , OTR/L 11/10/2016, 1:41 PM  [facility] [address] [place],  [place], [address] Phone: [phone]   Fax:  [phone]  Name: Eduardo Young MRN: [id number] Date of Birth: 21-Mar-1956

## 2016-11-10 NOTE — Therapy (Signed)
[facility] [address] [place], [place], [address] Phone: [phone]   Fax:  [phone]  Physical Therapy Treatment  Patient Details  Name: Eduardo Young MRN: [id number] Date of Birth: [birth year] Referring Provider: Alger Simons, MD  Encounter Date: 11/10/2016      PT End of Session - 11/10/16 1320    Visit Number 12   Number of Visits 17   Date for PT Re-Evaluation 11/20/16   Authorization Type Medicare G-code & progress note every 10th visit   PT Start Time 1318   PT Stop Time 1400   PT Time Calculation (min) 42 min   Equipment Utilized During Treatment Gait belt   Activity Tolerance Patient tolerated treatment well;Patient limited by fatigue   Behavior During Therapy [facility] for tasks assessed/performed;Anxious      Past Medical History:  Diagnosis Date  . Chronic venous insufficiency   . Diabetes mellitus   . GERD (gastroesophageal reflux disease)   . Gout   . Hemiparesis ([place])   . Hyperlipidemia   . Hypertension   . Obesity   . Renal calculi   . Sleep apnea, obstructive   . Stroke [facility])    [id number]    Past Surgical History:  Procedure Laterality Date  . LITHOTRIPSY    . NASAL POLYP EXCISION      There were no vitals filed for this visit.      Subjective Assessment - 11/10/16 1319    Subjective No falls or pain to report.    Patient is accompained by: Family member   Pertinent History CVA with right hemiparesis 2008, chronic venous insufficiency, DM2, GERD, gout, HLD, HTN, morbid obesity, sleep apnea,    Limitations Standing;Walking;House hold activities   Patient Stated Goals To improve activity level to what he is capable of doing.    Currently in Pain? No/denies   Pain Score 0-No pain            [facility] Adult PT Treatment/Exercise - 11/10/16 1322      Transfers   Transfers Sit to Stand;Stand to Sit   Sit to Stand 4: Min guard;5: Supervision;With upper extremity assist;From  chair/3-in-1;With armrests   Stand to Sit 5: Supervision;With upper extremity assist;4: Min guard;To chair/3-in-1;With armrests   Stand to Sit Details (indicate cue type and reason) Verbal cues for precautions/safety;Verbal cues for sequencing;Verbal cues for safe use of DME/AE     Ambulation/Gait   Ambulation/Gait Yes   Ambulation/Gait Assistance 4: Min guard;4: Min assist   Ambulation/Gait Assistance Details in parallel bars: cues for sequencing, forward weight shifting onto right LE (pt responds well to "rise up" to bring shoulder to PTA's hand help up as target) and waiting till he got there before he steps with left LE. progressed from parallel bars to hemiwalker: continued to provide the same cues with min assist to min guard assist (2 people to create secure feeling for pt with gait).                              Ambulation Distance (Feet) 6 Feet  x1, 4 x1 in parallel bars;   Assistive device Parallel bars;Hemi-walker   Gait Pattern Step-to pattern;Decreased arm swing - right;Decreased step length - left;Decreased stance time - right;Decreased stride length;Decreased hip/knee flexion - right;Decreased dorsiflexion - right;Decreased weight shift to right;Right hip hike;Right steppage;Right foot flat;Lateral hip instability;Decreased trunk rotation;Trunk flexed;Abducted- right;Poor foot clearance - right   Ambulation  Surface Level;Indoor     Neuro Re-ed    Neuro Re-ed Details  in parallel bars with ruler as target on floor: blocked practice of right stance with goal of stepping over ruler on floor. with cues, increased time, and mild facilitation for weight shifting forward, cues for up tall posture before stepping. pt able to progress to larger steps where just his heel is on ruler x 4 steps at end of session.             PT Short Term Goals - 10/18/16 2048      PT SHORT TERM GOAL #1   Title Patient demonstrates understanding of initial HEP. (Target Date: 10/20/2016)   Baseline  10/18/16: met today in session   Status Achieved     PT SHORT TERM GOAL #2   Title Patient performs stand pivot transfer with AD with supervision.  (Target Date: 10/20/2016)   Baseline 10/18/16: met today in session   Status Achieved     PT SHORT TERM GOAL #3   Title Patient demonstrates weight shift onto right LE in stance with cueing.  (Target Date: 10/20/2016)   Baseline 10/18/16: continues to demo decreased weight shifting and needs max cues/encouragement to shift any weight onto right LE   Status Partially Met           PT Long Term Goals - 10/05/16 1354      PT LONG TERM GOAL #1   Title Patient and sister demonstrate understanding of ongoing HEP. (Target Date: 11/17/2016)   Time 8   Period Weeks   Status On-going     PT LONG TERM GOAL #2   Title Patient standing balance: looks over shoulders & up with UE support, manages clothes & reaches 5" without UE support modified independent.  (Target Date: 11/17/2016)   Time 8   Period Weeks   Status On-going     PT LONG TERM GOAL #3   Title Patient verbalizes recommendations for equipment to maximize independence and proper use. (Target Date: 11/17/2016)   Time 8   Period Weeks   Status On-going     PT LONG TERM GOAL #4   Title Patient ambulates 10' with AD modified independent. (Target Date: 11/17/2016)   Time 8   Period Weeks   Status On-going            Plan - 11/10/16 1320    Clinical Impression Statement Today's skilled session continued to address pre-gait and gait with AFO with T strap on right LE. Pt was able to progress with increased weight shifting onto right side and increased step length on left toward end of session. Pt is making slow, steady progress and should benefit from continued PT to progress toward unmet goals.    Clinical Impairments Affecting Rehab Potential CVA with right hemiparesis 2008, chronic venous insufficiency, DM2, GERD, gout, HLD, HTN, morbid obesity, sleep apnea,   PT Frequency 2x / week    PT Duration 8 weeks   PT Treatment/Interventions ADLs/Self Care Home Management;Moist Heat;Ultrasound;DME Instruction;Gait training;Stair training;Functional mobility training;Therapeutic activities;Therapeutic exercise;Balance training;Neuromuscular re-education;Patient/family education;Wheelchair mobility training   PT Next Visit Plan with use of AFO with T-strap: work on stepping in parallel bars with emphasis on weight shifting/step length, right stance activites with emphasis on increased right weight bearing in standing.    Consulted and Agree with Plan of Care Patient      Patient will benefit from skilled therapeutic intervention in order to improve the following deficits and impairments:  Abnormal gait, Decreased activity tolerance, Decreased balance, Decreased endurance, Decreased knowledge of precautions, Decreased knowledge of use of DME, Decreased mobility, Decreased safety awareness, Decreased strength, Impaired flexibility, Postural dysfunction, Pain  Visit Diagnosis: Unsteadiness on feet  Muscle weakness (generalized)  Abnormal posture  Other abnormalities of gait and mobility     Problem List Patient Active Problem List   Diagnosis Date Noted  . Adjustment disorder with mixed anxiety and depressed mood   . Benign essential HTN   . Type 2 diabetes mellitus with peripheral neuropathy (HCC)   . Stage 3 chronic kidney disease   . Spastic hemiparesis of right dominant side ([place]) 09/08/2016  . Weakness   . Myoclonus   . Seizures ([place])   . TIA (transient ischemic attack) 09/07/2016  . Chronic venous insufficiency   . History of cerebral infarction ([place]) 10/21/2010  . DM (diabetes mellitus) ([place]) 10/21/2010  . HLD (hyperlipidemia) 10/21/2010  . HTN (hypertension) 10/21/2010  . Sleep apnea 10/21/2010    Willow Ora, PTA, [facility] [address], [place], [place] [address] [phone] 11/10/16, 5:06 PM   Name: Eduardo Young MRN:  [id number] Date of Birth: Aug 17, 1955

## 2016-11-13 ENCOUNTER — Encounter: Payer: Self-pay | Admitting: Physical Therapy

## 2016-11-13 ENCOUNTER — Ambulatory Visit: Payer: Medicare Other | Admitting: Occupational Therapy

## 2016-11-13 ENCOUNTER — Ambulatory Visit: Payer: Medicare Other | Admitting: Physical Therapy

## 2016-11-13 ENCOUNTER — Encounter: Payer: Self-pay | Admitting: Occupational Therapy

## 2016-11-13 DIAGNOSIS — R2689 Other abnormalities of gait and mobility: Secondary | ICD-10-CM | POA: Diagnosis not present

## 2016-11-13 DIAGNOSIS — R293 Abnormal posture: Secondary | ICD-10-CM

## 2016-11-13 DIAGNOSIS — M6281 Muscle weakness (generalized): Secondary | ICD-10-CM | POA: Diagnosis not present

## 2016-11-13 DIAGNOSIS — G8113 Spastic hemiplegia affecting right nondominant side: Secondary | ICD-10-CM | POA: Diagnosis not present

## 2016-11-13 DIAGNOSIS — R2681 Unsteadiness on feet: Secondary | ICD-10-CM | POA: Diagnosis not present

## 2016-11-13 DIAGNOSIS — R208 Other disturbances of skin sensation: Secondary | ICD-10-CM

## 2016-11-13 DIAGNOSIS — M79641 Pain in right hand: Secondary | ICD-10-CM

## 2016-11-13 DIAGNOSIS — I679 Cerebrovascular disease, unspecified: Secondary | ICD-10-CM | POA: Diagnosis not present

## 2016-11-13 DIAGNOSIS — I69353 Hemiplegia and hemiparesis following cerebral infarction affecting right non-dominant side: Secondary | ICD-10-CM

## 2016-11-13 NOTE — Therapy (Signed)
[facility] [address] [place], [place], [address] Phone: [phone]   Fax:  [phone]  Occupational Therapy Treatment  Patient Details  Name: Eduardo Young MRN: [id number] Date of Birth: 1956/03/21 Referring Provider: Dr. Naaman Plummer  Encounter Date: 11/13/2016      OT End of Session - 11/13/16 1616    Visit Number 12   Number of Visits 16   Date for OT Re-Evaluation 11/16/16   Authorization Type medicare will need G code and PN every 10th visit   Authorization Time Period 33   Authorization - Visit Number 12   Authorization - Number of Visits 20   OT Start Time 1121   OT Stop Time 1529   OT Time Calculation (min) 42 min   Activity Tolerance Patient tolerated treatment well      Past Medical History:  Diagnosis Date  . Chronic venous insufficiency   . Diabetes mellitus   . GERD (gastroesophageal reflux disease)   . Gout   . Hemiparesis ([place])   . Hyperlipidemia   . Hypertension   . Obesity   . Renal calculi   . Sleep apnea, obstructive   . Stroke [facility])    [id number]    Past Surgical History:  Procedure Laterality Date  . LITHOTRIPSY    . NASAL POLYP EXCISION      There were no vitals filed for this visit.      Subjective Assessment - 11/13/16 1455    Subjective  I think I see my regular doctor tomorrow   Pertinent History see epic. Pt with initial stroke in 2008, recently admitted to hospital thru ED on 09/07/2016 pt experiencing increased weakness in RLE - CT negative, overall pt has been declining over past year.   Patient Stated Goals to move around better    Currently in Pain? No/denies                      OT Treatments/Exercises (OP) - 11/13/16 1602      ADLs   Functional Mobility Addressed basic transfer  with hemi walker - pt limited in his ability to participate or progress as he does not have his own brace and is not tolerating clinic brace today.  Pt needed contact  guard for safety.     ADL Comments Pt's sister has not yet made appointment for custom brace. Discussed that pt cannot meet goals until pt has brace as mobility goals were set with pt having brace. Pt verbalized understanding. Also discussed upcoming botox injection for RLE as pt had questions. Answered all questions and pt verbalized understanding.  Pt to have injection this week.  Reinforced importance of consistently using half lap tray. - pt verbalized understanding and stated he does use it almost all the time when he is home (not on wheelchair here in clinic).  Discussed with pt placing him on hold until after botox injection and obtaining brace given remaining goals.  Pt in agreement. Pt to be placed on hold until May 21st.  Discussed rationale with pt's sister and pt's sister also in agreement. Sister to call to make appt for brace today.      Manual Therapy   Manual Therapy Edema management   Manual therapy comments Edema mgmt for RUE - pt with moderate edema today.  Pt with good results.                    OT Short Term Goals -  11/13/16 1608      OT SHORT TERM GOAL #1   Title Pt and sister will be mod I with HEP - 10/19/2016   Status Achieved     OT SHORT TERM GOAL #2   Title Pt and sister will be able to identify 2 strategies for normalizing tone   Status Achieved     OT SHORT TERM GOAL #3   Title Pt and sister will be mod I with splint wear and care   Status Achieved     OT SHORT TERM GOAL #4   Title Pt and sister will be mod I for UE positioing in wheelchair and in bed   Status Achieved           OT Long Term Goals - 11/13/16 1609      OT LONG TERM GOAL #1   Title Pt and sister will be mod I with ugraded HEP PRN- 11/16/2016   Status On-going     OT LONG TERM GOAL #2   Title Pt and sister will be mod I with edema control techniques for RUE   Status Achieved     OT LONG TERM GOAL #3   Title Pt will demonstrate adequate balance for safety and increased  ease with basic transfers in the home (toilet and bed)   Status On-going     OT LONG TERM GOAL #4   Title Pt will be able to demostrate adequate standing balance without UE to enage in simple ADL activities.    Status On-going               Plan - 11/13/16 1609    Clinical Impression Statement Pt to be placed on hold until after botox injection to RLE and obtaining custom brace as remaining goals are focused on mobility. Pt in agreement.    Rehab Potential Fair   Clinical Impairments Affecting Rehab Potential severity of deficits, length of time since onset   OT Frequency 2x / week   OT Duration 8 weeks   OT Treatment/Interventions Self-care/ADL training;Therapeutic exercise;Neuromuscular education;DME and/or AE instruction;Passive range of motion;Manual Therapy;Functional Mobility Training;Splinting;Therapeutic activities;Patient/family education;Balance training   Plan on hold until after botox injection in RLE and obtaining custom brace   Consulted and Agree with Plan of Care Patient   Family Member Consulted sister- Manuela Schwartz      Patient will benefit from skilled therapeutic intervention in order to improve the following deficits and impairments:  Abnormal gait, Decreased activity tolerance, Decreased balance, Decreased cognition, Decreased mobility, Decreased strength, Difficulty walking, Impaired UE functional use, Impaired tone, Impaired sensation, Pain  Visit Diagnosis: Unsteadiness on feet  Muscle weakness (generalized)  Abnormal posture  Pain in right hand  Other disturbances of skin sensation  Spastic hemiplegia affecting right nondominant side, unspecified etiology ([place])    Problem List Patient Active Problem List   Diagnosis Date Noted  . Adjustment disorder with mixed anxiety and depressed mood   . Benign essential HTN   . Type 2 diabetes mellitus with peripheral neuropathy (HCC)   . Stage 3 chronic kidney disease   . Spastic hemiparesis of right  dominant side ([place]) 09/08/2016  . Weakness   . Myoclonus   . Seizures ([place])   . TIA (transient ischemic attack) 09/07/2016  . Chronic venous insufficiency   . History of cerebral infarction ([place]) 10/21/2010  . DM (diabetes mellitus) ([place]) 10/21/2010  . HLD (hyperlipidemia) 10/21/2010  . HTN (hypertension) 10/21/2010  . Sleep apnea 10/21/2010    [place],  Estrella Myrtle , OTR/L 11/13/2016, 4:19 PM  [facility] [address] [place], [place], [address] Phone: [phone]   Fax:  [phone]  Name: Eduardo Young MRN: [id number] Date of Birth: Apr 27, 1956

## 2016-11-13 NOTE — Therapy (Addendum)
[facility] [address] [place], [place], [address] Phone: [phone]   Fax:  [phone]  Physical Therapy Treatment  Patient Details  Name: VIDIT BOISSONNEAULT MRN: [id number] Date of Birth: [birth year] Referring Provider: Alger Simons, MD  Encounter Date: 11/13/2016      PT End of Session - 11/13/16 1409    Visit Number 13   Number of Visits 17   Date for PT Re-Evaluation 11/20/16   Authorization Type Medicare G-code & progress note every 10th visit   PT Start Time 1406   PT Stop Time 1447   PT Time Calculation (min) 41 min   Equipment Utilized During Treatment Gait belt   Activity Tolerance Patient tolerated treatment well;Patient limited by fatigue   Behavior During Therapy [facility] for tasks assessed/performed;Anxious      Past Medical History:  Diagnosis Date  . Chronic venous insufficiency   . Diabetes mellitus   . GERD (gastroesophageal reflux disease)   . Gout   . Hemiparesis ([place])   . Hyperlipidemia   . Hypertension   . Obesity   . Renal calculi   . Sleep apnea, obstructive   . Stroke [facility])    [id number]    Past Surgical History:  Procedure Laterality Date  . LITHOTRIPSY    . NASAL POLYP EXCISION      There were no vitals filed for this visit.      Subjective Assessment - 11/13/16 1409    Subjective No falls or pain to report. Tired after last session.    Patient is accompained by: Family member   Pertinent History CVA with right hemiparesis 2008, chronic venous insufficiency, DM2, GERD, gout, HLD, HTN, morbid obesity, sleep apnea,    Limitations Standing;Walking;House hold activities   Patient Stated Goals To improve activity level to what he is capable of doing.    Currently in Pain? No/denies   Pain Score 0-No pain              [facility] Adult PT Treatment/Exercise - 11/13/16 1410      Transfers   Transfers Sit to Stand;Stand to Sit   Sit to Stand 5: Supervision;4: Min guard;With  upper extremity assist;From bed;From chair/3-in-1   Sit to Stand Details Verbal cues for sequencing;Verbal cues for technique;Verbal cues for safe use of DME/AE;Manual facilitation for weight bearing;Manual facilitation for weight shifting   Sit to Stand Details (indicate cue type and reason) cues to engage right LE with standing   Stand to Sit 5: Supervision;4: Min guard;With upper extremity assist;To bed;To chair/3-in-1   Stand to Sit Details (indicate cue type and reason) Verbal cues for sequencing;Verbal cues for technique;Verbal cues for precautions/safety;Verbal cues for safe use of DME/AE;Manual facilitation for placement;Manual facilitation for weight shifting;Manual facilitation for weight bearing   Stand to Sit Details cues for equal LE use/weight bearing with sitting down   Stand Pivot Transfers 5: Supervision   Stand Pivot Transfer Details (indicate cue type and reason) with hemiwalker. multimodal cues needed for technique/sequencing with trasfering wheelchair to mat table toward pt's right side     Ambulation/Gait   Ambulation/Gait Yes     Self-Care   Self-Care Other Self-Care Comments   Other Self-Care Comments  pt noted to have fresh blood on sock when PTA went to don brace. Pt noted to have open areas on right shin and anterior/medial aspect of right foot. Tegederm applied. By placing just the brace on pt's right foot it was noted that the shin plate  was over the area on pt's right shin and the T-straps were coming aross the medial strut of the brace directely over the sore on his foot. Did not use brace due to this and call placed to orthotist to notifiy him of brace possibly being the cause of the wounds before he proceedes with this brace for pt. Waitng on call back.  Prior to transfer from wheelchair to mat table pt was noted to have 5 small open areas/wounds on right elbow. all wounds covered with Tegaderm. Pt advised to monitor them for signs of infection. Pt verbalized  understanding.                                        Neuro Re-ed    Neuro Re-ed Details  standing next to mat table with hemi walker: ruler on floor, with multimodal cues had pt work on shifting his hips toward PTA on his right and cues to "lift" his should up to meet the PTA hand for increased right weight shifting before stepping left foot forward. pt able to achieve larger steps as reps progressed, however did not ever clear ruler with entire left foot. seated rest breaks needed as well.              PT Short Term Goals - 10/18/16 2048      PT SHORT TERM GOAL #1   Title Patient demonstrates understanding of initial HEP. (Target Date: 10/20/2016)   Baseline 10/18/16: met today in session   Status Achieved     PT SHORT TERM GOAL #2   Title Patient performs stand pivot transfer with AD with supervision.  (Target Date: 10/20/2016)   Baseline 10/18/16: met today in session   Status Achieved     PT SHORT TERM GOAL #3   Title Patient demonstrates weight shift onto right LE in stance with cueing.  (Target Date: 10/20/2016)   Baseline 10/18/16: continues to demo decreased weight shifting and needs max cues/encouragement to shift any weight onto right LE   Status Partially Met           PT Long Term Goals - 10/05/16 1354      PT LONG TERM GOAL #1   Title Patient and sister demonstrate understanding of ongoing HEP. (Target Date: 11/17/2016)   Time 8   Period Weeks   Status On-going     PT LONG TERM GOAL #2   Title Patient standing balance: looks over shoulders & up with UE support, manages clothes & reaches 5" without UE support modified independent.  (Target Date: 11/17/2016)   Time 8   Period Weeks   Status On-going     PT LONG TERM GOAL #3   Title Patient verbalizes recommendations for equipment to maximize independence and proper use. (Target Date: 11/17/2016)   Time 8   Period Weeks   Status On-going     PT LONG TERM GOAL #4   Title Patient ambulates 10' with AD modified  independent. (Target Date: 11/17/2016)   Time 8   Period Weeks   Status On-going               Plan - 11/13/16 1409    Clinical Impression Statement Today's skilled session continued to address step length of left foot and increased right LE weight bearing/right side weight shifitng. Gait held due to not using brace due to new wounds. Pt placed on  hold until he recieves his brace as all goals are standing/gait oriented and due to his ankle instability needs to have a brace on to progress toward these goals. PTA provided stance stability to right ankle in today's session.                        Clinical Impairments Affecting Rehab Potential CVA with right hemiparesis 2008, chronic venous insufficiency, DM2, GERD, gout, HLD, HTN, morbid obesity, sleep apnea,   PT Frequency 2x / week   PT Duration 8 weeks   PT Treatment/Interventions ADLs/Self Care Home Management;Moist Heat;Ultrasound;DME Instruction;Gait training;Stair training;Functional mobility training;Therapeutic activities;Therapeutic exercise;Balance training;Neuromuscular re-education;Patient/family education;Wheelchair mobility training   PT Next Visit Plan pt to return after he recieves his brace to continue working on weight shifting, step length and gait.    Consulted and Agree with Plan of Care Patient      Patient will benefit from skilled therapeutic intervention in order to improve the following deficits and impairments:  Abnormal gait, Decreased activity tolerance, Decreased balance, Decreased endurance, Decreased knowledge of precautions, Decreased knowledge of use of DME, Decreased mobility, Decreased safety awareness, Decreased strength, Impaired flexibility, Postural dysfunction, Pain  Visit Diagnosis: Unsteadiness on feet  Muscle weakness (generalized)  Abnormal posture  Other disturbances of skin sensation  Other abnormalities of gait and mobility  Hemiplegia and hemiparesis following cerebral infarction  affecting right non-dominant side [facility])     Problem List Patient Active Problem List   Diagnosis Date Noted  . Adjustment disorder with mixed anxiety and depressed mood   . Benign essential HTN   . Type 2 diabetes mellitus with peripheral neuropathy (HCC)   . Stage 3 chronic kidney disease   . Spastic hemiparesis of right dominant side ([place]) 09/08/2016  . Weakness   . Myoclonus   . Seizures ([place])   . TIA (transient ischemic attack) 09/07/2016  . Chronic venous insufficiency   . History of cerebral infarction ([place]) 10/21/2010  . DM (diabetes mellitus) ([place]) 10/21/2010  . HLD (hyperlipidemia) 10/21/2010  . HTN (hypertension) 10/21/2010  . Sleep apnea 10/21/2010    Willow Ora, PTA, [facility] [address], [place], [place] [address] [phone] 11/13/16, 9:30 PM   Name: PHAT DALTON MRN: [id number] Date of Birth: 06/13/1956  PT made aware of wounds. PT observed wound with no signs of infection. PT contacted orthotist to discuss AFO options. Concerns with bilateral double upright durability with his weight & amount of support needed ie could snap attachment at shoe over time. Thermoplastic solid ankle AFO would need 1 size larger shoe size which would require purchase of 2 sets of shoes to keep proper size on other foot & decrease wound potential. Orthotist to further assess patient and call PT. Jamey Reas, PT, DPT PT Specializing in [place] 11/14/16 8:32 AM Phone:  [phone]  Fax:  [phone] [place] [address] [place], [place] [address]

## 2016-11-14 ENCOUNTER — Encounter: Payer: Self-pay | Admitting: Family Medicine

## 2016-11-14 ENCOUNTER — Ambulatory Visit (INDEPENDENT_AMBULATORY_CARE_PROVIDER_SITE_OTHER): Payer: Medicare Other | Admitting: Family Medicine

## 2016-11-14 VITALS — BP 158/74 | HR 64 | Temp 98.0°F | Resp 20

## 2016-11-14 DIAGNOSIS — I10 Essential (primary) hypertension: Secondary | ICD-10-CM

## 2016-11-14 LAB — BASIC METABOLIC PANEL WITH GFR
BUN: 28 mg/dL — ABNORMAL HIGH (ref 7–25)
CO2: 26 mmol/L (ref 20–31)
Calcium: 9.1 mg/dL (ref 8.6–10.3)
Chloride: 102 mmol/L (ref 98–110)
Creat: 1.59 mg/dL — ABNORMAL HIGH (ref 0.70–1.33)
GFR, Est African American: 55 mL/min — ABNORMAL LOW (ref >=60)
GFR, Est Non African American: 47 mL/min — ABNORMAL LOW (ref >=60)
Glucose, Bld: 197 mg/dL — ABNORMAL HIGH (ref 70–99)
Potassium: 3.6 mmol/L (ref 3.5–5.3)
Sodium: 142 mmol/L (ref 135–146)

## 2016-11-14 NOTE — Progress Notes (Signed)
Subjective:    Patient ID: Eduardo Young, male    DOB: 06/08/1956, 61 y.o.   MRN: [id number]  Medication Refill    Unfortunately, the patient was recently admitted to the hospital. I have copied relevant portions of the discharge summary below and included them for my reference:  Admit date: 09/08/2016 Discharge date: 09/16/2016  Discharge Diagnoses:  Principal Problem:   Spastic hemiparesis of right dominant side ([place]) Active Problems:   HLD (hyperlipidemia)   Sleep apnea   Weakness   Myoclonus   Seizures (HCC)   Benign essential HTN   Type 2 diabetes mellitus with peripheral neuropathy ([place])   Stage 3 chronic kidney disease   Adjustment disorder with mixed anxiety and depressed mood   Discharged Condition: stable   Significant Diagnostic Studies: Dg Chest 2 View  Result Date: 09/08/2016 CLINICAL DATA:  Left-sided weakness EXAM: CHEST  2 VIEW COMPARISON:  03/24/2013 FINDINGS: Cardiac shadow is stable. The lungs are well aerated bilaterally. No focal infiltrate or sizable effusion is seen. No bony abnormality is noted. IMPRESSION: No acute abnormality seen. Electronically Signed   By: Inez Catalina M.D.   On: 09/08/2016 07:44   Ct Head Wo Young  Result Date: 09/07/2016 CLINICAL DATA:  Right leg weakness.  History of stroke. EXAM: CT HEAD WITHOUT Young TECHNIQUE: Contiguous axial images were obtained from the base of the skull through the vertex without intravenous Young. 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 Slight ex vacuo dilatation of body of the left lateral ventricle. Chronic small vessel ischemic disease of periventricular white matter. No acute intracranial hemorrhage, midline shift or edema. No extra-axial fluid. Vascular: No hyperdense vessels or unexpected calcifications. Skull: Intact. Sinuses/Orbits: Clear mastoids. Moderate ethmoid and frontal sinus mucosal  thickening. Mild bilateral maxillary and ethmoid sinus mucosal thickening. Other: None IMPRESSION: 1. Chronic small vessel ischemic disease of periventricular white matter. 2. 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 3. No acute intracranial abnormality. 4. Chronic paranasal sinusitis. Electronically Signed   By: Ashley Royalty M.D.   On: 09/07/2016 21:11   Mr Brain Wo Young  Result Date: 09/08/2016 CLINICAL DATA:  Acute onset of weakness last night. EXAM: MRI HEAD WITHOUT Young TECHNIQUE: Multiplanar, multiecho pulse sequences of the brain and surrounding structures were obtained without intravenous Young. COMPARISON:  Head CT 09/07/2016 FINDINGS: Brain: Diffusion imaging does not show any acute or subacute infarction. Old Wallerian degeneration of the brainstem on the left. The cerebellum is normal. Cerebral hemispheres show moderate chronic small-vessel ischemic changes of the deep white matter with an old lacunar infarction in the right basal ganglia and an extensive old infarction in the left thalamus, posterior basal ganglia and radiating white matter tracts with atrophy and gliosis. Hemosiderin deposition is present throughout that region. No evidence of neoplastic mass lesion, acute hemorrhage, hydrocephalus or extra-axial collection. There is ex vacuo enlargement of the left lateral ventricle. No pituitary mass. Vascular: Major vessels at the base of the brain show flow. Skull and upper cervical spine: Negative Sinuses and orbits: Mucosal inflammatory changes of the paranasal sinuses. Orbits negative. Other: None significant IMPRESSION: Old infarction in the left hemisphere affecting the thalamus, basal ganglia and deep white matter. Unchanged from previous studies. No acute finding. Electronically Signed   By: Nelson Chimes M.D.   On: 09/08/2016 06:40   Brief HPI:   Eduardo GUNDERMAN is a 61 y.o. 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 MRI with old infarct in left hemisphere affecting thalamus, basal gangli and deep white matter--unchanged. Patient with reports of intermittent uncontrolled movements of RLE followed by weakness. EEG without focal seizure activaity. Therapy evaluations done and patient noted to have limitations in mobility and self care tasks. CIR recommended for follow up therapy   Hospital Course: [name] was admitted to rehab 09/08/2016 for inpatient therapies to consist of PT and OT at least three hours five days a week. Past admission physiatrist, therapy team and rehab RN have worked together to provide customized collaborative inpatient rehab.  Klonopin was added to help manage spasticity and myoclonus. He has refused to use CPAP during his stay. Blood pressures were noted to be poorly controlled and catapres was increased to bid with better control. Acute on chronic renal failure has improved with hydration and k dur was added to supplement hypokalemia. His diabetes was monitored with ac/hs cbg checks and metformin was discontinued due to evidence of CKD with Scr 1.7. Po intake has been good and BS have been controlled on lantus 20 units bid. He has made good progress and is modified independent at wheelchair level.  He will continue to receive outpatient PT and OT at [facility] starting 09/21/16.    Rehab course: During patient's stay in rehab team conference was held to monitor patient's progress, set goals and discuss barriers to discharge. At admission, patient required min assist for basic self care tasks and mobility. He has had improvement in activity tolerance, balance, postural control, as well as ability to compensate for deficits.  He requires min assist to complete ADL tasks.  He is modified independent for transfers and is ambulating 165' at with supervision and use of hemi walker on the  left. He requires max assist to climb stairs.  Family education was completed with sister who will assist as needed after discharge.    Disposition: Home  Diet: Heart Healthy.   Special Instructions: 1. Needs supervision for safety.  2. Needs BMET rechecked in a week 1-2 week to monitor renal status and potassium levels.   09/28/16 Here today for follow-up. Reviewed the discharge summary shows that they discontinue metformin due to worsening renal function. Increased Lantus to 20 units twice a day to control his blood sugars.  However, now that the patient's back at home he is apparently back to taking Lantus 35 units once a day. They increased his clonidine to BID manage his blood pressures. Potassium was added for hypokalemia. Current medicine list was reconciled hospital discharge list. Since discharge from the hospital, patient has started back on metformin as his sister is giving him his medication and she states that she was not told to stop it. She independently put him back on basal or 35 units once a day. She independently swishes clonidine to once a day. His blood pressure today is extremely high. He is not checking his blood sugars. He denies any chest pain shortness of breath or dyspnea on exertion. At that time, my plan was: 1) DC metformin 2) Replace with Tradjenta 5 mg a day 3) Continue basaglar 35 units once a day. 4) Increase clonidine 0.1 mg pobid and recheck BP in 1 week 5) Recheck CMP and CBC.    I believe the patient would be possible for Medicaid given that his monthly income is $1500 or less. I  recommended that they inquire see him as qualified as it may help pay for some of his medication. Sister is adamant that he will not qualify and seems to be agitated by my suggestion. Therefore I will not pursue further. I recheck hemoglobin A1c in 3 months after switching metformin to Tradjenta.  We'll monitor his renal function and his potassium. We need to get better control of his  blood pressure so he is going to call me with his blood pressures in one week after increasing the frequency of clonidine.  11/14/16 A she is due today to recheck his blood pressure. Blood pressure remains significantly elevated systolic readings in the 790-240 range documented here as well as at the rehabilitation center and other medical offices. He is currently taking clonidine 0.1 mg by mouth twice a day in addition to his other medication. He is not taking hydralazine as prescribed. He is taking 50 mg in the morning, 50 mg in the afternoon, and only 25 mg in the evening.     Past Medical History:  Diagnosis Date  . Chronic venous insufficiency   . Diabetes mellitus   . GERD (gastroesophageal reflux disease)   . Gout   . Hemiparesis ([place])   . Hyperlipidemia   . Hypertension   . Obesity   . Renal calculi   . Sleep apnea, obstructive   . Stroke [facility])    [id number]   Past Surgical History:  Procedure Laterality Date  . LITHOTRIPSY    . NASAL POLYP EXCISION     Current Outpatient Prescriptions on File Prior to Visit  Medication Sig Dispense Refill  . allopurinol (ZYLOPRIM) 100 MG tablet TAKE ONE TABLET BY MOUTH TWICE DAILY 60 tablet 11  . aspirin 81 MG tablet Take 81 mg by mouth every evening.     . cloNIDine (CATAPRES) 0.1 MG tablet Take 1 tablet (0.1 mg total) by mouth 2 (two) times daily. 60 tablet 0  . doxazosin (CARDURA) 4 MG tablet TAKE ONE TABLET BY MOUTH ONCE DAILY 30 tablet 11  . hydrALAZINE (APRESOLINE) 25 MG tablet Take 2 tablets (50 mg total) by mouth every 8 (eight) hours. 180 tablet 11  . hydrochlorothiazide (HYDRODIURIL) 25 MG tablet TAKE ONE TABLET BY MOUTH ONCE DAILY 30 tablet 11  . Insulin Glargine (BASAGLAR KWIKPEN) 100 UNIT/ML SOPN Inject 35 Units into the skin daily.     Marland Kitchen labetalol (NORMODYNE) 300 MG tablet TAKE ONE TABLET BY MOUTH TWICE DAILY 60 tablet 11  . linagliptin (TRADJENTA) 5 MG TABS tablet Take 1 tablet (5 mg total) by mouth daily. 30 tablet 5  .  lisinopril (PRINIVIL,ZESTRIL) 20 MG tablet TAKE ONE TABLET BY MOUTH TWICE DAILY 60 tablet 11  . Multiple Vitamin (MULTIVITAMIN WITH MINERALS) TABS tablet Take 1 tablet by mouth daily.    . potassium chloride SA (K-DUR,KLOR-CON) 20 MEQ tablet Take 1 tablet (20 mEq total) by mouth 3 (three) times daily. 90 tablet 0  . senna-docusate (SENOKOT-S) 8.6-50 MG tablet Take 2 tablets by mouth at bedtime. 60 tablet 0  . simvastatin (ZOCOR) 40 MG tablet Take 1 tablet (40 mg total) by mouth at bedtime. 30 tablet 0   No current facility-administered medications on file prior to visit.    No Known Allergies Social History   Social History  . Marital status: Single    Spouse name: N/A  . Number of children: N/A  . Years of education: N/A   Occupational History  . Not on file.   Social History Main  Topics  . Smoking status: Former Research scientist (life sciences)  . Smokeless tobacco: Never Used  . Alcohol use Yes     Comment: Occasional  . Drug use: Yes    Types: Cocaine, Marijuana     Comment: Past Hx  . Sexual activity: Not on file   Other Topics Concern  . Not on file   Social History Narrative  . No narrative on file      Review of Systems  All other systems reviewed and are negative.      Objective:   Physical Exam  Constitutional: He appears well-developed and well-nourished.  Cardiovascular: Normal rate, regular rhythm and normal heart sounds.   Pulmonary/Chest: Effort normal and breath sounds normal. No respiratory distress. He has no wheezes. He has no rales.  Abdominal: Soft. Bowel sounds are normal. He exhibits no distension. There is no tenderness. There is no rebound and no guarding.  Skin: No rash noted. No erythema.  Vitals reviewed. Sitting in wheelchair with chronic right hemiplegia.         Assessment & Plan:  Benign essential HTN - Plan: BASIC METABOLIC PANEL WITH GFR  Pressure remains uncontrolled. Increase clonidine to 0.2 mg by mouth twice a day and increase hydralazine 50 mg  by mouth 3 times a day and recheck blood pressure in 2 weeks

## 2016-11-15 ENCOUNTER — Encounter: Payer: Self-pay | Admitting: Physical Medicine & Rehabilitation

## 2016-11-15 ENCOUNTER — Encounter (HOSPITAL_BASED_OUTPATIENT_CLINIC_OR_DEPARTMENT_OTHER): Payer: Medicare Other | Admitting: Physical Medicine & Rehabilitation

## 2016-11-15 VITALS — BP 124/71 | HR 65

## 2016-11-15 DIAGNOSIS — I10 Essential (primary) hypertension: Secondary | ICD-10-CM | POA: Diagnosis not present

## 2016-11-15 DIAGNOSIS — I69353 Hemiplegia and hemiparesis following cerebral infarction affecting right non-dominant side: Secondary | ICD-10-CM | POA: Diagnosis not present

## 2016-11-15 DIAGNOSIS — S91301A Unspecified open wound, right foot, initial encounter: Secondary | ICD-10-CM | POA: Diagnosis not present

## 2016-11-15 DIAGNOSIS — G8113 Spastic hemiplegia affecting right nondominant side: Secondary | ICD-10-CM

## 2016-11-15 DIAGNOSIS — E1351 Other specified diabetes mellitus with diabetic peripheral angiopathy without gangrene: Secondary | ICD-10-CM | POA: Diagnosis not present

## 2016-11-15 DIAGNOSIS — E1140 Type 2 diabetes mellitus with diabetic neuropathy, unspecified: Secondary | ICD-10-CM | POA: Diagnosis not present

## 2016-11-15 DIAGNOSIS — E1121 Type 2 diabetes mellitus with diabetic nephropathy: Secondary | ICD-10-CM | POA: Diagnosis not present

## 2016-11-15 DIAGNOSIS — L602 Onychogryphosis: Secondary | ICD-10-CM | POA: Diagnosis not present

## 2016-11-15 DIAGNOSIS — G8110 Spastic hemiplegia affecting unspecified side: Secondary | ICD-10-CM | POA: Diagnosis not present

## 2016-11-15 DIAGNOSIS — R269 Unspecified abnormalities of gait and mobility: Secondary | ICD-10-CM | POA: Diagnosis not present

## 2016-11-15 DIAGNOSIS — I69398 Other sequelae of cerebral infarction: Secondary | ICD-10-CM | POA: Diagnosis not present

## 2016-11-15 DIAGNOSIS — B351 Tinea unguium: Secondary | ICD-10-CM | POA: Diagnosis not present

## 2016-11-15 NOTE — Progress Notes (Signed)
Dysport: Procedure Note Patient Name: Eduardo Young DOB: Oct 12, 1955 MRN: [id number]   Procedure: Botulinum toxin administration Guidance: EMG Diagnosis: Spastic hemiplegia affecting nondominant side Date: 11/15/16 Attending: Delice Lesch, MD    Informed consent: Risks, benefits & options of the procedure are explained to the patient (and/or family). The patient elects to proceed with procedure. Risks include but are not limited to weakness, respiratory distress, dry mouth, ptosis, antibody formation, worsening of some areas of function. Benefits include decreased abnormal muscle tone, improved hygiene and positioning, decreased skin breakdown and, in some cases, decreased pain. Options include conservative management with oral antispasticity agents, phenol chemodenervation of nerve or at motor nerve branches. More invasive options include intrathecal balcofen adminstration for appropriate candidates. Surgical options may include tendon lengthening or transposition or, rarely, dorsal rhizotomy.   History/Physical Examination: 61 y.o. 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 Pt with spasticity RUE/RLE, but would like injections only for RLE  mAS right ankle flexors 2/4  Previous Treatments: Therapy/Range of motion Indication for guidance: Target active muscules  Procedure: Botulinum toxin was mixed with preservative free saline with a dilution of 0.5cc to 100 units. Targeted limb and muscles were identified. The skin was prepped with alcohol swabs and placement of needle tip in targeted muscle was confirmed using appropriate guidance. Prior to injection, positioning of needle tip outside of blood vessel was determined by pulling back on syringe plunger.  MUSCLE UNITS Right Med Gastroc 150U Right Lat Gastroc 150U  Total units used: 300 Complications: None Plan:  Follow in 6 weeks  Kerrilynn Derenzo Lorie Phenix 11/15/16 12:37 PM

## 2016-11-16 ENCOUNTER — Encounter: Payer: Medicare Other | Admitting: Occupational Therapy

## 2016-11-16 ENCOUNTER — Ambulatory Visit: Payer: Medicare Other | Admitting: Physical Therapy

## 2016-11-17 ENCOUNTER — Ambulatory Visit: Payer: Medicare Other | Admitting: Family Medicine

## 2016-11-20 ENCOUNTER — Encounter: Payer: Medicare Other | Admitting: Occupational Therapy

## 2016-11-20 ENCOUNTER — Ambulatory Visit: Payer: Medicare Other | Admitting: Physical Therapy

## 2016-11-23 ENCOUNTER — Encounter: Payer: Medicare Other | Admitting: Occupational Therapy

## 2016-11-23 ENCOUNTER — Ambulatory Visit: Payer: Medicare Other | Admitting: Physical Therapy

## 2016-11-27 ENCOUNTER — Other Ambulatory Visit: Payer: Self-pay | Admitting: Physical Medicine and Rehabilitation

## 2016-11-28 ENCOUNTER — Ambulatory Visit: Payer: Medicare Other | Admitting: Physical Therapy

## 2016-11-28 ENCOUNTER — Encounter: Payer: Medicare Other | Admitting: Occupational Therapy

## 2016-11-28 ENCOUNTER — Other Ambulatory Visit: Payer: Self-pay | Admitting: Family Medicine

## 2016-11-28 MED ORDER — SIMVASTATIN 40 MG PO TABS: 40.0000 mg | ORAL_TABLET | Freq: Every day | ORAL | 1 refills | Status: DC

## 2016-11-29 ENCOUNTER — Telehealth: Payer: Self-pay | Admitting: Family Medicine

## 2016-11-29 MED ORDER — SIMVASTATIN 40 MG PO TABS: 40.0000 mg | ORAL_TABLET | Freq: Every day | ORAL | 1 refills | Status: DC

## 2016-11-29 NOTE — Telephone Encounter (Signed)
This was sent to pharm with confirmation 11/28/16 - will resend

## 2016-11-29 NOTE — Telephone Encounter (Signed)
Patients sister susan calling to say that [facility] still does not have his simvastatin , would like this done today if possible he is going to be out   [phone]

## 2016-11-30 ENCOUNTER — Encounter: Payer: Medicare Other | Admitting: Occupational Therapy

## 2016-11-30 ENCOUNTER — Ambulatory Visit: Payer: Medicare Other | Admitting: Physical Therapy

## 2016-12-11 ENCOUNTER — Ambulatory Visit: Payer: Medicare Other | Admitting: Occupational Therapy

## 2016-12-11 ENCOUNTER — Ambulatory Visit: Payer: Medicare Other | Admitting: Physical Therapy

## 2016-12-14 ENCOUNTER — Ambulatory Visit: Payer: Medicare Other | Admitting: Physical Therapy

## 2016-12-14 ENCOUNTER — Ambulatory Visit: Payer: Medicare Other | Admitting: Occupational Therapy

## 2016-12-20 ENCOUNTER — Encounter: Payer: Medicare Other | Admitting: Occupational Therapy

## 2016-12-20 ENCOUNTER — Ambulatory Visit: Payer: Medicare Other

## 2016-12-21 ENCOUNTER — Ambulatory Visit: Payer: Medicare Other | Admitting: Occupational Therapy

## 2016-12-21 ENCOUNTER — Encounter: Payer: Self-pay | Admitting: Physical Medicine & Rehabilitation

## 2016-12-21 ENCOUNTER — Ambulatory Visit: Payer: Medicare Other | Admitting: Physical Therapy

## 2016-12-21 ENCOUNTER — Encounter: Payer: Medicare Other | Attending: Physical Medicine & Rehabilitation | Admitting: Physical Medicine & Rehabilitation

## 2016-12-21 VITALS — BP 168/89 | HR 63

## 2016-12-21 DIAGNOSIS — Z8249 Family history of ischemic heart disease and other diseases of the circulatory system: Secondary | ICD-10-CM | POA: Diagnosis not present

## 2016-12-21 DIAGNOSIS — Z801 Family history of malignant neoplasm of trachea, bronchus and lung: Secondary | ICD-10-CM | POA: Insufficient documentation

## 2016-12-21 DIAGNOSIS — G8110 Spastic hemiplegia affecting unspecified side: Secondary | ICD-10-CM | POA: Diagnosis not present

## 2016-12-21 DIAGNOSIS — E1140 Type 2 diabetes mellitus with diabetic neuropathy, unspecified: Secondary | ICD-10-CM | POA: Diagnosis not present

## 2016-12-21 DIAGNOSIS — R269 Unspecified abnormalities of gait and mobility: Secondary | ICD-10-CM | POA: Diagnosis not present

## 2016-12-21 DIAGNOSIS — Z823 Family history of stroke: Secondary | ICD-10-CM | POA: Insufficient documentation

## 2016-12-21 DIAGNOSIS — Z8041 Family history of malignant neoplasm of ovary: Secondary | ICD-10-CM | POA: Diagnosis not present

## 2016-12-21 DIAGNOSIS — Z87891 Personal history of nicotine dependence: Secondary | ICD-10-CM | POA: Insufficient documentation

## 2016-12-21 DIAGNOSIS — I10 Essential (primary) hypertension: Secondary | ICD-10-CM | POA: Diagnosis not present

## 2016-12-21 DIAGNOSIS — K219 Gastro-esophageal reflux disease without esophagitis: Secondary | ICD-10-CM | POA: Insufficient documentation

## 2016-12-21 DIAGNOSIS — Z87442 Personal history of urinary calculi: Secondary | ICD-10-CM | POA: Diagnosis not present

## 2016-12-21 DIAGNOSIS — M109 Gout, unspecified: Secondary | ICD-10-CM | POA: Diagnosis not present

## 2016-12-21 DIAGNOSIS — G4733 Obstructive sleep apnea (adult) (pediatric): Secondary | ICD-10-CM | POA: Diagnosis not present

## 2016-12-21 DIAGNOSIS — E785 Hyperlipidemia, unspecified: Secondary | ICD-10-CM | POA: Insufficient documentation

## 2016-12-21 DIAGNOSIS — I69353 Hemiplegia and hemiparesis following cerebral infarction affecting right non-dominant side: Secondary | ICD-10-CM | POA: Diagnosis not present

## 2016-12-21 DIAGNOSIS — E1121 Type 2 diabetes mellitus with diabetic nephropathy: Secondary | ICD-10-CM | POA: Diagnosis not present

## 2016-12-21 DIAGNOSIS — I69398 Other sequelae of cerebral infarction: Secondary | ICD-10-CM | POA: Diagnosis not present

## 2016-12-21 DIAGNOSIS — I872 Venous insufficiency (chronic) (peripheral): Secondary | ICD-10-CM | POA: Diagnosis not present

## 2016-12-21 DIAGNOSIS — I69351 Hemiplegia and hemiparesis following cerebral infarction affecting right dominant side: Secondary | ICD-10-CM | POA: Diagnosis present

## 2016-12-21 DIAGNOSIS — E669 Obesity, unspecified: Secondary | ICD-10-CM | POA: Diagnosis not present

## 2016-12-21 NOTE — Progress Notes (Deleted)
Subjective:    Patient ID: Eduardo Young, male    DOB: 07-28-1955, 61 y.o.   MRN: [id number]  HPI: [name] is a 61 year old male who is here for a Face to Wellsite geologist. I have reviewed the Physical Therapist Wheelchair Evaluation Form and Concur with their assessment and evaluation. The Physical Therapist recommends Power Recline on Power Base for positional accommodations for pressure relief.  [name] has the Physical and Mental ability to operate the Power Wheelchair. The Power Wheelchair will be a great asset for [name]. This will help with his mobility in his home, also improving his independence with activities of daily living, such as bathing, dressing and toileting.  [name] weight noted on the Physical Therapy Evaluation on 10/09/2016 is 288.5lbs.   [name] attending physical therapy twice a week.  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   Pain Inventory Average Pain 0 Pain Right Now 0 My pain is .  In the last 24 hours, has pain interfered with the following? General activity 0 Relation with others 0 Enjoyment of life 0 What TIME of day is your pain at its worst? . Sleep (in general) .  Pain is worse with: . Pain improves with: . Relief from Meds: .  Mobility use a wheelchair  Function disabled: date disabled .  Neuro/Psych No problems in this area  Prior Studies Any changes since last visit?  no  Physicians involved in your care Any changes since last visit?  no   Family History  Problem Relation Age of Onset  . Heart attack Mother   . Hypertension Mother   . Hyperlipidemia Mother   . Hypertension Father   . Hyperlipidemia Father   . CVA Father   . Heart attack Sister   . Lung cancer Brother   . Heart attack Brother   . Ovarian cancer Sister   . Heart attack Sister   . CVA Sister    Social History   Social History  . Marital status: Single    Spouse  name: N/A  . Number of children: N/A  . Years of education: N/A   Social History Main Topics  . Smoking status: Former Research scientist (life sciences)  . Smokeless tobacco: Never Used  . Alcohol use Yes     Comment: Occasional  . Drug use: Yes    Types: Cocaine, Marijuana     Comment: Past Hx  . Sexual activity: Not Asked   Other Topics Concern  . None   Social History Narrative  . None   Past Surgical History:  Procedure Laterality Date  . LITHOTRIPSY    . NASAL POLYP EXCISION     Past Medical History:  Diagnosis Date  . Chronic venous insufficiency   . Diabetes mellitus   . GERD (gastroesophageal reflux disease)   . Gout   . Hemiparesis ([place])   . Hyperlipidemia   . Hypertension   . Obesity   . Renal calculi   . Sleep apnea, obstructive   . Stroke ([place])    [id number]   Pulse 63   SpO2 93%   Opioid Risk Score:   Fall Risk Score:  `1  Depression screen PHQ 2/9  Depression screen [facility] 2/9 10/26/2016 09/28/2016  Decreased Interest 0 0  Down, Depressed, Hopeless 0 1  PHQ - 2 Score 0 1  Altered sleeping 1 -  Tired, decreased energy 1 -  Change in appetite 0 -  Feeling bad or failure about yourself  0 -  Trouble concentrating 0 -  Moving slowly or fidgety/restless 0 -  Suicidal thoughts 0 -  PHQ-9 Score 2 -  Difficult doing work/chores Not difficult at all -     Review of Systems  Constitutional: Negative.   HENT: Negative.   Eyes: Negative.   Respiratory: Negative.   Cardiovascular: Negative.   Gastrointestinal: Positive for constipation.  Endocrine: Negative.   Genitourinary: Negative.   Musculoskeletal: Negative.   Skin: Negative.   Allergic/Immunologic: Negative.   Neurological: Negative.   Hematological: Negative.   Psychiatric/Behavioral: Negative.   All other systems reviewed and are negative.      Objective:   Physical Exam  Constitutional: He is oriented to person, place, and time. He appears well-developed and well-nourished.  HENT:  Head: Normocephalic and  atraumatic.  Neck: Normal range of motion. Neck supple.  Cardiovascular: Normal rate and regular rhythm.   Pulmonary/Chest: Effort normal and breath sounds normal.  Musculoskeletal:  Normal Muscle Bulk and Muscle Testing Reveals: Upper Extremities: Right: Paralysis Left: Full ROM and Muscle Strength 5/5 Lower Extremities: Left: Full ROM and Muscle Strength 5/5 Right: Decreased ROM and Muscle Strength 4/5 Right Lower Extremity Flexion Produce Pain into Right Foot Arrived in wheelchair  Neurological: He is alert and oriented to person, place, and time.  Skin: Skin is warm and dry.  Psychiatric: He has a normal mood and affect.  Nursing note and vitals reviewed.         Assessment & Plan:  1. 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 Power Environmental health practitioner Today.  therapies Schedule for Botulinum toxin injection on 11/15/2016 2. HTN: PCP Following. 3. T2DM with neuropathy and nephropathy: PCP Following 4. Abnormality of gait: Continue Physical Therapy, Plan for Power Wheelchair.              30 minutes of face to face patient care time was spent during this visit. All questions were encouraged and answered.

## 2016-12-21 NOTE — Progress Notes (Signed)
Subjective:    Patient ID: Eduardo Young, male    DOB: [birth year], 61 y.o.   MRN: [id number]  HPI 61 y.o. 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  Presents with sister, who provides the history. At discharge, he was instructed he needed supervision, which he states he has.  He saw his PCP, who did lab work.  His edema has improved. His BP remains elevated.  Sister does not note any changes in medications. He has not been checking his CBGs. He notes slight improvement in hypertonicity in RUE/RLE.  Denies seizures.  Denies falls.   Last clinic visit 11/15/16.  He had Dysport injection at that time.  He believes he had good benefit from the injection with significant reduction in tone. BP remains elevated, pt states he did not take his meds this morning.    Pain Inventory Average Pain 5 Pain Right Now 0 My pain is aching  In the last 24 hours, has pain interfered with the following? General activity 0 Relation with others 0 Enjoyment of life 0 What TIME of day is your pain at its worst? morning Sleep (in general) Fair  Pain is worse with: unsure Pain improves with: no answer Relief from Meds: no answer  Mobility use a walker how many minutes can you walk? 2 ability to climb steps?  no do you drive?  no use a wheelchair transfers alone  Function disabled: date disabled . I need assistance with the following:  bathing, household duties and shopping  Neuro/Psych weakness tremor tingling trouble walking spasms  Prior Studies Any changes since last visit?  no  Physicians involved in your care Any changes since last visit?  no   Family History  Problem Relation Age of Onset  . Heart attack Mother   . Hypertension Mother   . Hyperlipidemia Mother   . Hypertension Father   . Hyperlipidemia Father   . CVA Father   . Heart attack Sister   . Lung cancer Brother   .  Heart attack Brother   . Ovarian cancer Sister   . Heart attack Sister   . CVA Sister    Social History   Social History  . Marital status: Single    Spouse name: N/A  . Number of children: N/A  . Years of education: N/A   Social History Main Topics  . Smoking status: Former Research scientist (life sciences)  . Smokeless tobacco: Never Used  . Alcohol use Yes     Comment: Occasional  . Drug use: Yes    Types: Cocaine, Marijuana     Comment: Past Hx  . Sexual activity: Not Asked   Other Topics Concern  . None   Social History Narrative  . None   Past Surgical History:  Procedure Laterality Date  . LITHOTRIPSY    . NASAL POLYP EXCISION     Past Medical History:  Diagnosis Date  . Chronic venous insufficiency   . Diabetes mellitus   . GERD (gastroesophageal reflux disease)   . Gout   . Hemiparesis ([place])   . Hyperlipidemia   . Hypertension   . Obesity   . Renal calculi   . Sleep apnea, obstructive   . Stroke ([place])    [id number]   BP (!) 168/89   Pulse 63   SpO2 93%   Opioid Risk Score:   Fall Risk Score:  `1  Depression screen [facility] 2/9  Depression screen [facility] 2/9 10/26/2016 09/28/2016  Decreased Interest 0 0  Down, Depressed, Hopeless 0 1  PHQ - 2 Score 0 1  Altered sleeping 1 -  Tired, decreased energy 1 -  Change in appetite 0 -  Feeling bad or failure about yourself  0 -  Trouble concentrating 0 -  Moving slowly or fidgety/restless 0 -  Suicidal thoughts 0 -  PHQ-9 Score 2 -  Difficult doing work/chores Not difficult at all -     Review of Systems  Constitutional: Negative.   HENT: Negative.   Eyes: Negative.   Respiratory: Positive for apnea.   Cardiovascular: Negative.   Gastrointestinal: Positive for constipation.  Endocrine: Negative.   Genitourinary: Negative.   Musculoskeletal: Positive for gait problem.       Spasms  Skin: Negative.   Allergic/Immunologic: Negative.   Neurological: Positive for tremors.       Tingling  Hematological: Negative.     Psychiatric/Behavioral: Negative.   All other systems reviewed and are negative.     Objective:   Physical Exam Constitutional: He appears well-developed and well-nourished. NAD. HENT: Normocephalic and atraumatic.  Eyes: EOMI. No discharge.  Cardiovascular: RRR. No JVD. Respiratory: CTA B. Unlabored.  GI: Soft. Bowel sounds are normal.   Musculoskeletal: No tenderness.  BLE with stasis changes and healed lesions.  Neurological: He is alert and oriented with some confusion. Right facial weakness  Able to follow commands without difficulty.  LUE/LLE: 5/5 RUE: 0/5, mAS wrist flexors 1+/4, finger flexors 2/4 RLE: HF 3/5 KE 3+/5 2-/5 ADF/PF, mAS knee flexors 1/4, ankle plantar flexors 1/4 Dysarthria Skin: Skin is warm and dry. Intact. Psychiatric: Tangential, verbose. Confused    Assessment & Plan:  61 y.o. 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    1. 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 HTN   Remains elevated, pt states he did not take meds this AM  3. T2DM with neuropathy and nephropathy  Encouraged monitoring CBGs  Follow up with PCP for med adjustments if needed  4. Abnormality of gait  Cont therapies  Pt still in manual chair

## 2016-12-25 ENCOUNTER — Ambulatory Visit: Payer: Medicare Other | Admitting: Physical Therapy

## 2016-12-25 ENCOUNTER — Encounter: Payer: Medicare Other | Admitting: Occupational Therapy

## 2016-12-27 ENCOUNTER — Encounter: Payer: Medicare Other | Admitting: Physical Medicine & Rehabilitation

## 2016-12-28 ENCOUNTER — Encounter: Payer: Medicare Other | Admitting: Occupational Therapy

## 2016-12-28 ENCOUNTER — Ambulatory Visit: Payer: Medicare Other | Admitting: Physical Therapy

## 2017-01-26 ENCOUNTER — Other Ambulatory Visit: Payer: Self-pay | Admitting: Family Medicine

## 2017-01-26 DIAGNOSIS — I10 Essential (primary) hypertension: Secondary | ICD-10-CM

## 2017-02-01 ENCOUNTER — Encounter: Payer: Medicare Other | Attending: Physical Medicine & Rehabilitation | Admitting: Physical Medicine & Rehabilitation

## 2017-02-01 ENCOUNTER — Encounter: Payer: Self-pay | Admitting: Physical Medicine & Rehabilitation

## 2017-02-01 VITALS — BP 156/86 | HR 68

## 2017-02-01 DIAGNOSIS — I872 Venous insufficiency (chronic) (peripheral): Secondary | ICD-10-CM | POA: Diagnosis not present

## 2017-02-01 DIAGNOSIS — Z8249 Family history of ischemic heart disease and other diseases of the circulatory system: Secondary | ICD-10-CM | POA: Diagnosis not present

## 2017-02-01 DIAGNOSIS — R269 Unspecified abnormalities of gait and mobility: Secondary | ICD-10-CM | POA: Diagnosis not present

## 2017-02-01 DIAGNOSIS — E1140 Type 2 diabetes mellitus with diabetic neuropathy, unspecified: Secondary | ICD-10-CM | POA: Diagnosis not present

## 2017-02-01 DIAGNOSIS — E1121 Type 2 diabetes mellitus with diabetic nephropathy: Secondary | ICD-10-CM | POA: Diagnosis not present

## 2017-02-01 DIAGNOSIS — Z801 Family history of malignant neoplasm of trachea, bronchus and lung: Secondary | ICD-10-CM | POA: Insufficient documentation

## 2017-02-01 DIAGNOSIS — Z8041 Family history of malignant neoplasm of ovary: Secondary | ICD-10-CM | POA: Insufficient documentation

## 2017-02-01 DIAGNOSIS — Z87442 Personal history of urinary calculi: Secondary | ICD-10-CM | POA: Insufficient documentation

## 2017-02-01 DIAGNOSIS — G4733 Obstructive sleep apnea (adult) (pediatric): Secondary | ICD-10-CM | POA: Insufficient documentation

## 2017-02-01 DIAGNOSIS — Z87891 Personal history of nicotine dependence: Secondary | ICD-10-CM | POA: Insufficient documentation

## 2017-02-01 DIAGNOSIS — I10 Essential (primary) hypertension: Secondary | ICD-10-CM | POA: Insufficient documentation

## 2017-02-01 DIAGNOSIS — E669 Obesity, unspecified: Secondary | ICD-10-CM | POA: Diagnosis not present

## 2017-02-01 DIAGNOSIS — M109 Gout, unspecified: Secondary | ICD-10-CM | POA: Diagnosis not present

## 2017-02-01 DIAGNOSIS — I69398 Other sequelae of cerebral infarction: Secondary | ICD-10-CM | POA: Insufficient documentation

## 2017-02-01 DIAGNOSIS — G8110 Spastic hemiplegia affecting unspecified side: Secondary | ICD-10-CM | POA: Diagnosis not present

## 2017-02-01 DIAGNOSIS — I69353 Hemiplegia and hemiparesis following cerebral infarction affecting right non-dominant side: Secondary | ICD-10-CM | POA: Insufficient documentation

## 2017-02-01 DIAGNOSIS — E785 Hyperlipidemia, unspecified: Secondary | ICD-10-CM | POA: Insufficient documentation

## 2017-02-01 DIAGNOSIS — Z823 Family history of stroke: Secondary | ICD-10-CM | POA: Diagnosis not present

## 2017-02-01 DIAGNOSIS — I69351 Hemiplegia and hemiparesis following cerebral infarction affecting right dominant side: Secondary | ICD-10-CM | POA: Diagnosis present

## 2017-02-01 DIAGNOSIS — K219 Gastro-esophageal reflux disease without esophagitis: Secondary | ICD-10-CM | POA: Diagnosis not present

## 2017-02-01 NOTE — Progress Notes (Signed)
Dysport: Procedure Note Patient Name: Eduardo Young DOB: 02-Mar-1956 MRN: [id number] Date: 02/01/17  Procedure: Botulinum toxin administration Guidance: EMG Diagnosis: Spastic hemiplegia affecting nondominant side Attending: Delice Lesch, MD    Informed consent: Risks, benefits & options of the procedure are explained to the patient (and/or family). The patient elects to proceed with procedure. Risks include but are not limited to weakness, respiratory distress, dry mouth, ptosis, antibody formation, worsening of some areas of function. Benefits include decreased abnormal muscle tone, improved hygiene and positioning, decreased skin breakdown and, in some cases, decreased pain. Options include conservative management with oral antispasticity agents, phenol chemodenervation of nerve or at motor nerve branches. More invasive options include intrathecal balcofen adminstration for appropriate candidates. Surgical options may include tendon lengthening or transposition or, rarely, dorsal rhizotomy.   History/Physical Examination: 62 y.o. 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 Pt with spasticity RUE/RLE, but would like injections only for RLE  mAS right ankle flexors 2/4  Previous Treatments: Therapy/Range of motion Indication for guidance: Target active muscules  Procedure: Botulinum toxin was mixed with preservative free saline with a dilution of 0.5cc to 100 units. Targeted limb and muscles were identified. The skin was prepped with alcohol swabs and placement of needle tip in targeted muscle was confirmed using appropriate guidance. Prior to injection, positioning of needle tip outside of blood vessel was determined by pulling back on syringe plunger.  MUSCLE UNITS Right Med Gastroc 150U Right Lat Gastroc 150U  Total units used: 834 Complications: None Plan:  Follow in 6 weeks  Daimian Sudberry Anil Nikeshia Keetch 1:02 PM

## 2017-02-07 DIAGNOSIS — L602 Onychogryphosis: Secondary | ICD-10-CM | POA: Diagnosis not present

## 2017-02-07 DIAGNOSIS — E1351 Other specified diabetes mellitus with diabetic peripheral angiopathy without gangrene: Secondary | ICD-10-CM | POA: Diagnosis not present

## 2017-03-01 ENCOUNTER — Telehealth: Payer: Self-pay | Admitting: Family Medicine

## 2017-03-01 NOTE — Telephone Encounter (Signed)
Manuela Schwartz called and states that his Lady [place] has went to $146 dollars and he can not afford that. Can we send him in something cheaper?

## 2017-03-02 NOTE — Telephone Encounter (Signed)
[facility] - pt may be in the "Gap" with his insurance or they may have changed his ins.

## 2017-03-02 NOTE — Telephone Encounter (Signed)
januvia 100 poqday (or what is preferred on his insurance).

## 2017-03-02 NOTE — Telephone Encounter (Signed)
Spoke to [place] and he is the "Gap" so any medication is going to be expensive - give her some details on patient assistance and she will check on that and discuss further with Dr. Dennard Schaumann at his ov the end of this month.

## 2017-03-05 ENCOUNTER — Encounter: Payer: Self-pay | Admitting: Family Medicine

## 2017-03-15 ENCOUNTER — Ambulatory Visit: Payer: Medicare Other | Admitting: Family Medicine

## 2017-03-19 ENCOUNTER — Encounter: Payer: Self-pay | Admitting: Family Medicine

## 2017-03-22 ENCOUNTER — Other Ambulatory Visit: Payer: Self-pay | Admitting: Family Medicine

## 2017-03-22 ENCOUNTER — Encounter: Payer: Medicare Other | Admitting: Physical Medicine & Rehabilitation

## 2017-03-30 ENCOUNTER — Encounter: Payer: Self-pay | Admitting: Physical Medicine & Rehabilitation

## 2017-03-30 ENCOUNTER — Encounter: Payer: Medicare Other | Attending: Physical Medicine & Rehabilitation | Admitting: Physical Medicine & Rehabilitation

## 2017-03-30 VITALS — BP 136/77 | HR 69

## 2017-03-30 DIAGNOSIS — Z87442 Personal history of urinary calculi: Secondary | ICD-10-CM | POA: Insufficient documentation

## 2017-03-30 DIAGNOSIS — R269 Unspecified abnormalities of gait and mobility: Secondary | ICD-10-CM | POA: Diagnosis not present

## 2017-03-30 DIAGNOSIS — E785 Hyperlipidemia, unspecified: Secondary | ICD-10-CM | POA: Insufficient documentation

## 2017-03-30 DIAGNOSIS — I872 Venous insufficiency (chronic) (peripheral): Secondary | ICD-10-CM | POA: Insufficient documentation

## 2017-03-30 DIAGNOSIS — Z8041 Family history of malignant neoplasm of ovary: Secondary | ICD-10-CM | POA: Diagnosis not present

## 2017-03-30 DIAGNOSIS — Z8249 Family history of ischemic heart disease and other diseases of the circulatory system: Secondary | ICD-10-CM | POA: Insufficient documentation

## 2017-03-30 DIAGNOSIS — Z801 Family history of malignant neoplasm of trachea, bronchus and lung: Secondary | ICD-10-CM | POA: Diagnosis not present

## 2017-03-30 DIAGNOSIS — K219 Gastro-esophageal reflux disease without esophagitis: Secondary | ICD-10-CM | POA: Diagnosis not present

## 2017-03-30 DIAGNOSIS — E1140 Type 2 diabetes mellitus with diabetic neuropathy, unspecified: Secondary | ICD-10-CM | POA: Diagnosis not present

## 2017-03-30 DIAGNOSIS — M109 Gout, unspecified: Secondary | ICD-10-CM | POA: Diagnosis not present

## 2017-03-30 DIAGNOSIS — I10 Essential (primary) hypertension: Secondary | ICD-10-CM | POA: Insufficient documentation

## 2017-03-30 DIAGNOSIS — I69353 Hemiplegia and hemiparesis following cerebral infarction affecting right non-dominant side: Secondary | ICD-10-CM | POA: Insufficient documentation

## 2017-03-30 DIAGNOSIS — I69351 Hemiplegia and hemiparesis following cerebral infarction affecting right dominant side: Secondary | ICD-10-CM | POA: Diagnosis present

## 2017-03-30 DIAGNOSIS — E1121 Type 2 diabetes mellitus with diabetic nephropathy: Secondary | ICD-10-CM | POA: Diagnosis not present

## 2017-03-30 DIAGNOSIS — E669 Obesity, unspecified: Secondary | ICD-10-CM | POA: Insufficient documentation

## 2017-03-30 DIAGNOSIS — Z87891 Personal history of nicotine dependence: Secondary | ICD-10-CM | POA: Diagnosis not present

## 2017-03-30 DIAGNOSIS — I69398 Other sequelae of cerebral infarction: Secondary | ICD-10-CM | POA: Insufficient documentation

## 2017-03-30 DIAGNOSIS — Z823 Family history of stroke: Secondary | ICD-10-CM | POA: Insufficient documentation

## 2017-03-30 DIAGNOSIS — G4733 Obstructive sleep apnea (adult) (pediatric): Secondary | ICD-10-CM | POA: Diagnosis not present

## 2017-03-30 DIAGNOSIS — G8110 Spastic hemiplegia affecting unspecified side: Secondary | ICD-10-CM | POA: Diagnosis not present

## 2017-03-30 NOTE — Progress Notes (Signed)
Subjective:    Patient ID: Eduardo Young, male    DOB: 10/27/1955, 61 y.o.   MRN: [id number]  HPI 61 y.o. 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  Presents with sister, who provides the history. At discharge, he was instructed he needed supervision, which he states he has.  He saw his PCP, who did lab work.  His edema has improved. His BP remains elevated.  Sister does not note any changes in medications. He has not been checking his CBGs. He notes slight improvement in hypertonicity in RUE/RLE.  Denies seizures.  Denies falls.   Last clinic visit 01/1217. He had a dysport injection at that time.  Since that time, he states he is receiving benefit and would like to maintain current dose.  He has completed therapies.  Denies falls.   Pain Inventory Average Pain 5 Pain Right Now 5 My pain is burning, tingling and aching  In the last 24 hours, has pain interfered with the following? General activity 1 Relation with others 0 Enjoyment of life 1 What TIME of day is your pain at its worst? evening Sleep (in general) Fair  Pain is worse with: standing and unsure Pain improves with: injections Relief from Meds: 5  Mobility walk with assistance use a walker how many minutes can you walk? 2 ability to climb steps?  no do you drive?  no use a wheelchair transfers alone  Function disabled: date disabled . retired I need assistance with the following:  bathing  Neuro/Psych bowel control problems weakness trouble walking spasms depression  Prior Studies Any changes since last visit?  no  Physicians involved in your care Any changes since last visit?  no   Family History  Problem Relation Age of Onset  . Heart attack Mother   . Hypertension Mother   . Hyperlipidemia Mother   . Hypertension Father   . Hyperlipidemia Father   . CVA Father   . Heart attack Sister   .  Lung cancer Brother   . Heart attack Brother   . Ovarian cancer Sister   . Heart attack Sister   . CVA Sister    Social History   Social History  . Marital status: Single    Spouse name: N/A  . Number of children: N/A  . Years of education: N/A   Social History Main Topics  . Smoking status: Former Research scientist (life sciences)  . Smokeless tobacco: Never Used  . Alcohol use Yes     Comment: Occasional  . Drug use: Yes    Types: Cocaine, Marijuana     Comment: Past Hx  . Sexual activity: Not Asked   Other Topics Concern  . None   Social History Narrative  . None   Past Surgical History:  Procedure Laterality Date  . LITHOTRIPSY    . NASAL POLYP EXCISION     Past Medical History:  Diagnosis Date  . Chronic venous insufficiency   . Diabetes mellitus   . GERD (gastroesophageal reflux disease)   . Gout   . Hemiparesis ([place])   . Hyperlipidemia   . Hypertension   . Obesity   . Renal calculi   . Sleep apnea, obstructive   . Stroke ([place])    [id number]   BP 136/77   Pulse 69   SpO2 94%   Opioid Risk Score:   Fall Risk Score:  `1  Depression screen [facility] 2/9  Depression screen [facility] 2/9 10/26/2016 09/28/2016  Decreased Interest 0 0  Down, Depressed, Hopeless 0 1  PHQ - 2 Score 0 1  Altered sleeping 1 -  Tired, decreased energy 1 -  Change in appetite 0 -  Feeling bad or failure about yourself  0 -  Trouble concentrating 0 -  Moving slowly or fidgety/restless 0 -  Suicidal thoughts 0 -  PHQ-9 Score 2 -  Difficult doing work/chores Not difficult at all -     Review of Systems  HENT: Negative.   Eyes: Negative.   Respiratory: Positive for apnea.   Cardiovascular: Negative.   Gastrointestinal: Positive for constipation.  Endocrine: Negative.   Genitourinary: Negative.   Musculoskeletal: Positive for gait problem.       Spasms  Skin: Negative.   Allergic/Immunologic: Negative.   Neurological: Positive for tremors, speech difficulty and weakness.       Tingling  Hematological:  Negative.   Psychiatric/Behavioral: Negative.   All other systems reviewed and are negative.     Objective:   Physical Exam Constitutional: He appears well-developed and well-nourished. NAD. HENT: Normocephalic and atraumatic.  Eyes: EOMI. No discharge.  Cardiovascular: RRR. No JVD. Respiratory: CTA B. Unlabored.  GI: Soft. Bowel sounds are normal.   Musculoskeletal: No tenderness.  BLE with stasis changes and healed lesions.  Neurological: He is alert and oriented with some confusion. Right facial weakness  Able to follow commands without difficulty.  LUE/LLE: 5/5 RUE: 0/5, mAS wrist flexors 1/4, finger flexors 2/4 RLE: HF 3/5 KE 3+/5 2-/5 ADF/PF, mAS knee flexors 1/4, ankle plantar flexors 1/4 Dysarthria Skin: Skin is warm and dry. Intact. Psychiatric: Tangential, verbose. Confused    Assessment & Plan:  61 y.o. 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    1. 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 Abnormality of gait  Cont HEP  Pt still in manual chair, states he did follow up with power chair - he had a bad experience when he was measured  >25 minutes spent with patient, with >20 min spent counseling regarding exercises, power chair, AFO

## 2017-04-03 ENCOUNTER — Other Ambulatory Visit: Payer: Self-pay | Admitting: Family Medicine

## 2017-04-18 DIAGNOSIS — E1351 Other specified diabetes mellitus with diabetic peripheral angiopathy without gangrene: Secondary | ICD-10-CM | POA: Diagnosis not present

## 2017-04-18 DIAGNOSIS — L602 Onychogryphosis: Secondary | ICD-10-CM | POA: Diagnosis not present

## 2017-04-28 ENCOUNTER — Other Ambulatory Visit: Payer: Self-pay | Admitting: Family Medicine

## 2017-05-10 ENCOUNTER — Encounter: Payer: Medicare Other | Attending: Physical Medicine & Rehabilitation | Admitting: Physical Medicine & Rehabilitation

## 2017-05-10 ENCOUNTER — Encounter: Payer: Self-pay | Admitting: Physical Medicine & Rehabilitation

## 2017-05-10 VITALS — BP 125/74 | HR 67

## 2017-05-10 DIAGNOSIS — Z87891 Personal history of nicotine dependence: Secondary | ICD-10-CM | POA: Diagnosis not present

## 2017-05-10 DIAGNOSIS — Z8249 Family history of ischemic heart disease and other diseases of the circulatory system: Secondary | ICD-10-CM | POA: Diagnosis not present

## 2017-05-10 DIAGNOSIS — K219 Gastro-esophageal reflux disease without esophagitis: Secondary | ICD-10-CM | POA: Diagnosis not present

## 2017-05-10 DIAGNOSIS — E785 Hyperlipidemia, unspecified: Secondary | ICD-10-CM | POA: Diagnosis not present

## 2017-05-10 DIAGNOSIS — I69351 Hemiplegia and hemiparesis following cerebral infarction affecting right dominant side: Secondary | ICD-10-CM | POA: Diagnosis present

## 2017-05-10 DIAGNOSIS — I10 Essential (primary) hypertension: Secondary | ICD-10-CM | POA: Insufficient documentation

## 2017-05-10 DIAGNOSIS — M109 Gout, unspecified: Secondary | ICD-10-CM | POA: Diagnosis not present

## 2017-05-10 DIAGNOSIS — Z87442 Personal history of urinary calculi: Secondary | ICD-10-CM | POA: Insufficient documentation

## 2017-05-10 DIAGNOSIS — E1140 Type 2 diabetes mellitus with diabetic neuropathy, unspecified: Secondary | ICD-10-CM | POA: Diagnosis not present

## 2017-05-10 DIAGNOSIS — R269 Unspecified abnormalities of gait and mobility: Secondary | ICD-10-CM | POA: Diagnosis not present

## 2017-05-10 DIAGNOSIS — G8110 Spastic hemiplegia affecting unspecified side: Secondary | ICD-10-CM

## 2017-05-10 DIAGNOSIS — I872 Venous insufficiency (chronic) (peripheral): Secondary | ICD-10-CM | POA: Insufficient documentation

## 2017-05-10 DIAGNOSIS — E669 Obesity, unspecified: Secondary | ICD-10-CM | POA: Diagnosis not present

## 2017-05-10 DIAGNOSIS — I69353 Hemiplegia and hemiparesis following cerebral infarction affecting right non-dominant side: Secondary | ICD-10-CM | POA: Diagnosis not present

## 2017-05-10 DIAGNOSIS — I69398 Other sequelae of cerebral infarction: Secondary | ICD-10-CM | POA: Diagnosis not present

## 2017-05-10 DIAGNOSIS — Z801 Family history of malignant neoplasm of trachea, bronchus and lung: Secondary | ICD-10-CM | POA: Insufficient documentation

## 2017-05-10 DIAGNOSIS — E1121 Type 2 diabetes mellitus with diabetic nephropathy: Secondary | ICD-10-CM | POA: Diagnosis not present

## 2017-05-10 DIAGNOSIS — G4733 Obstructive sleep apnea (adult) (pediatric): Secondary | ICD-10-CM | POA: Insufficient documentation

## 2017-05-10 DIAGNOSIS — Z8041 Family history of malignant neoplasm of ovary: Secondary | ICD-10-CM | POA: Diagnosis not present

## 2017-05-10 DIAGNOSIS — Z823 Family history of stroke: Secondary | ICD-10-CM | POA: Insufficient documentation

## 2017-05-10 NOTE — Progress Notes (Signed)
Dysport: Procedure Note Patient Name: Eduardo Young DOB: June 24, 1956 MRN: [id number] Date: 05/10/17  Procedure: Botulinum toxin administration Guidance: EMG Diagnosis: Spastic hemiplegia affecting nondominant side Attending: Delice Lesch, MD    Informed consent: Risks, benefits & options of the procedure are explained to the patient (and/or family). The patient elects to proceed with procedure. Risks include but are not limited to weakness, respiratory distress, dry mouth, ptosis, antibody formation, worsening of some areas of function. Benefits include decreased abnormal muscle tone, improved hygiene and positioning, decreased skin breakdown and, in some cases, decreased pain. Options include conservative management with oral antispasticity agents, phenol chemodenervation of nerve or at motor nerve branches. More invasive options include intrathecal balcofen adminstration for appropriate candidates. Surgical options may include tendon lengthening or transposition or, rarely, dorsal rhizotomy.   History/Physical Examination: 61 y.o. 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 Pt with spasticity RUE/RLE, but would like injections only for RLE  mAS right ankle flexors 2/4  Previous Treatments: Therapy/Range of motion Indication for guidance: Target active muscules  Procedure: Botulinum toxin was mixed with preservative free saline with a dilution of 0.5cc to 100 units. Targeted limb and muscles were identified. The skin was prepped with alcohol swabs and placement of needle tip in targeted muscle was confirmed using appropriate guidance. Prior to injection, positioning of needle tip outside of blood vessel was determined by pulling back on syringe plunger.  MUSCLE UNITS Right Med Gastroc 200U Right Lat Gastroc 200U  Total units used: 400 Total units discarded due to  unavoidable waste: 479 Complications: None Plan: Follow in 6 weeks  Eduardo Young Eduardo Young 1:02 PM

## 2017-05-31 ENCOUNTER — Other Ambulatory Visit: Payer: Self-pay | Admitting: Family Medicine

## 2017-05-31 DIAGNOSIS — I10 Essential (primary) hypertension: Secondary | ICD-10-CM

## 2017-06-07 ENCOUNTER — Ambulatory Visit (INDEPENDENT_AMBULATORY_CARE_PROVIDER_SITE_OTHER): Payer: Medicare Other | Admitting: Family Medicine

## 2017-06-07 ENCOUNTER — Other Ambulatory Visit: Payer: Self-pay | Admitting: Family Medicine

## 2017-06-07 ENCOUNTER — Encounter: Payer: Self-pay | Admitting: Family Medicine

## 2017-06-07 VITALS — BP 168/80 | HR 78 | Temp 98.6°F | Resp 16

## 2017-06-07 DIAGNOSIS — I10 Essential (primary) hypertension: Secondary | ICD-10-CM

## 2017-06-07 DIAGNOSIS — Z794 Long term (current) use of insulin: Secondary | ICD-10-CM | POA: Diagnosis not present

## 2017-06-07 DIAGNOSIS — Z23 Encounter for immunization: Secondary | ICD-10-CM | POA: Diagnosis not present

## 2017-06-07 DIAGNOSIS — Z8673 Personal history of transient ischemic attack (TIA), and cerebral infarction without residual deficits: Secondary | ICD-10-CM | POA: Diagnosis not present

## 2017-06-07 DIAGNOSIS — E119 Type 2 diabetes mellitus without complications: Secondary | ICD-10-CM

## 2017-06-07 DIAGNOSIS — E7800 Pure hypercholesterolemia, unspecified: Secondary | ICD-10-CM | POA: Diagnosis not present

## 2017-06-07 MED ORDER — ATORVASTATIN CALCIUM 40 MG PO TABS: 40.0000 mg | ORAL_TABLET | Freq: Every day | ORAL | 3 refills | Status: DC

## 2017-06-07 MED ORDER — AMLODIPINE BESYLATE 10 MG PO TABS: 10.0000 mg | ORAL_TABLET | Freq: Every day | ORAL | 3 refills | Status: DC

## 2017-06-07 NOTE — Progress Notes (Signed)
Subjective:    Patient ID: Eduardo Young, male    DOB: 1956-06-16, 61 y.o.   MRN: [id number]  Medication Refill    Unfortunately, the patient was recently admitted to the hospital. I have copied relevant portions of the discharge summary below and included them for my reference:  Admit date: 09/08/2016 Discharge date: 09/16/2016  Discharge Diagnoses:  Principal Problem:   Spastic hemiparesis of right dominant side ([place]) Active Problems:   HLD (hyperlipidemia)   Sleep apnea   Weakness   Myoclonus   Seizures (HCC)   Benign essential HTN   Type 2 diabetes mellitus with peripheral neuropathy ([place])   Stage 3 chronic kidney disease   Adjustment disorder with mixed anxiety and depressed mood   Discharged Condition: stable   Significant Diagnostic Studies: Dg Chest 2 View  Result Date: 09/08/2016 CLINICAL DATA:  Left-sided weakness EXAM: CHEST  2 VIEW COMPARISON:  03/24/2013 FINDINGS: Cardiac shadow is stable. The lungs are well aerated bilaterally. No focal infiltrate or sizable effusion is seen. No bony abnormality is noted. IMPRESSION: No acute abnormality seen. Electronically Signed   By: Inez Catalina M.D.   On: 09/08/2016 07:44   Ct Head Wo Contrast  Result Date: 09/07/2016 CLINICAL DATA:  Right leg weakness.  History of stroke. EXAM: CT HEAD WITHOUT CONTRAST TECHNIQUE: Contiguous axial images were obtained from the base of the skull through the vertex without intravenous contrast. 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 Slight ex vacuo dilatation of body of the left lateral ventricle. Chronic small vessel ischemic disease of periventricular white matter. No acute intracranial hemorrhage, midline shift or edema. No extra-axial fluid. Vascular: No hyperdense vessels or unexpected calcifications. Skull: Intact. Sinuses/Orbits: Clear mastoids. Moderate ethmoid and frontal sinus mucosal  thickening. Mild bilateral maxillary and ethmoid sinus mucosal thickening. Other: None IMPRESSION: 1. Chronic small vessel ischemic disease of periventricular white matter. 2. 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 3. No acute intracranial abnormality. 4. Chronic paranasal sinusitis. Electronically Signed   By: Ashley Royalty M.D.   On: 09/07/2016 21:11   Mr Brain Wo Contrast  Result Date: 09/08/2016 CLINICAL DATA:  Acute onset of weakness last night. EXAM: MRI HEAD WITHOUT CONTRAST TECHNIQUE: Multiplanar, multiecho pulse sequences of the brain and surrounding structures were obtained without intravenous contrast. COMPARISON:  Head CT 09/07/2016 FINDINGS: Brain: Diffusion imaging does not show any acute or subacute infarction. Old Wallerian degeneration of the brainstem on the left. The cerebellum is normal. Cerebral hemispheres show moderate chronic small-vessel ischemic changes of the deep white matter with an old lacunar infarction in the right basal ganglia and an extensive old infarction in the left thalamus, posterior basal ganglia and radiating white matter tracts with atrophy and gliosis. Hemosiderin deposition is present throughout that region. No evidence of neoplastic mass lesion, acute hemorrhage, hydrocephalus or extra-axial collection. There is ex vacuo enlargement of the left lateral ventricle. No pituitary mass. Vascular: Major vessels at the base of the brain show flow. Skull and upper cervical spine: Negative Sinuses and orbits: Mucosal inflammatory changes of the paranasal sinuses. Orbits negative. Other: None significant IMPRESSION: Old infarction in the left hemisphere affecting the thalamus, basal ganglia and deep white matter. Unchanged from previous studies. No acute finding. Electronically Signed   By: Nelson Chimes M.D.   On: 09/08/2016 06:40   Brief HPI:   Eduardo Young is a 61 y.o. 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 MRI with old infarct in left hemisphere affecting thalamus, basal gangli and deep white matter--unchanged. Patient with reports of intermittent uncontrolled movements of RLE followed by weakness. EEG without focal seizure activaity. Therapy evaluations done and patient noted to have limitations in mobility and self care tasks. CIR recommended for follow up therapy   Hospital Course: Eduardo Young was admitted to rehab 09/08/2016 for inpatient therapies to consist of PT and OT at least three hours five days a week. Past admission physiatrist, therapy team and rehab RN have worked together to provide customized collaborative inpatient rehab.  Klonopin was added to help manage spasticity and myoclonus. He has refused to use CPAP during his stay. Blood pressures were noted to be poorly controlled and catapres was increased to bid with better control. Acute on chronic renal failure has improved with hydration and k dur was added to supplement hypokalemia. His diabetes was monitored with ac/hs cbg checks and metformin was discontinued due to evidence of CKD with Scr 1.7. Po intake has been good and BS have been controlled on lantus 20 units bid. He has made good progress and is modified independent at wheelchair level.  He will continue to receive outpatient PT and OT at [facility] starting 09/21/16.    Rehab course: During patient's stay in rehab team conference was held to monitor patient's progress, set goals and discuss barriers to discharge. At admission, patient required min assist for basic self care tasks and mobility. He has had improvement in activity tolerance, balance, postural control, as well as ability to compensate for deficits.  He requires min assist to complete ADL tasks.  He is modified independent for transfers and is ambulating 165' at with supervision and use of hemi walker on the  left. He requires max assist to climb stairs.  Family education was completed with sister who will assist as needed after discharge.    Disposition: Home  Diet: Heart Healthy.   Special Instructions: 1. Needs supervision for safety.  2. Needs BMET rechecked in a week 1-2 week to monitor renal status and potassium levels.   09/28/16 Here today for follow-up. Reviewed the discharge summary shows that they discontinue metformin due to worsening renal function. Increased Lantus to 20 units twice a day to control his blood sugars.  However, now that the patient's back at home he is apparently back to taking Lantus 35 units once a day. They increased his clonidine to BID manage his blood pressures. Potassium was added for hypokalemia. Current medicine list was reconciled hospital discharge list. Since discharge from the hospital, patient has started back on metformin as his sister is giving him his medication and she states that she was not told to stop it. She independently put him back on basal or 35 units once a day. She independently swishes clonidine to once a day. His blood pressure today is extremely high. He is not checking his blood sugars. He denies any chest pain shortness of breath or dyspnea on exertion. At that time, my plan was: 1) DC metformin 2) Replace with Tradjenta 5 mg a day 3) Continue basaglar 35 units once a day. 4) Increase clonidine 0.1 mg pobid and recheck BP in 1 week 5) Recheck CMP and CBC.    I believe the patient would be possible for Medicaid given that his monthly income is $1500 or less. I  recommended that they inquire see him as qualified as it may help pay for some of his medication. Sister is adamant that he will not qualify and seems to be agitated by my suggestion. Therefore I will not pursue further. I recheck hemoglobin A1c in 3 months after switching metformin to Tradjenta.  We'll monitor his renal function and his potassium. We need to get better control of his  blood pressure so he is going to call me with his blood pressures in one week after increasing the frequency of clonidine.  11/14/16 A she is due today to recheck his blood pressure. Blood pressure remains significantly elevated systolic readings in the 409-811 range documented here as well as at the rehabilitation center and other medical offices. He is currently taking clonidine 0.1 mg by mouth twice a day in addition to his other medication. He is not taking hydralazine as prescribed. He is taking 50 mg in the morning, 50 mg in the afternoon, and only 25 mg in the evening.  At that time, my plan was: Pressure remains uncontrolled. Increase clonidine to 0.2 mg by mouth twice a day and increase hydralazine 50 mg by mouth 3 times a day and recheck blood pressure in 2 weeks  06/07/17 Patient never followed up as described in the plan above.  His blood pressure today is elevated at 168/80.  Review of his chart and his other doctors offices show similarly elevated blood pressures in the 914-782 range systolic.  His biggest concern is that the fact that he is in the donut hole and cannot afford his Tradjenta and insulin.  However he will not check his blood sugars making 70/30 insulin dangerous.  He is not checking his blood pressure and he is not checking his blood sugar.  Furthermore he is also eating cakes and cookies and sweets without concern.  He has no specific diet.  He also does not follow-up as planned making it difficult to manage his chronic medical conditions.    Past Medical History:  Diagnosis Date  . Chronic venous insufficiency   . Diabetes mellitus   . GERD (gastroesophageal reflux disease)   . Gout   . Hemiparesis ([place])   . Hyperlipidemia   . Hypertension   . Obesity   . Renal calculi   . Sleep apnea, obstructive   . Stroke [facility])    [id number]   Past Surgical History:  Procedure Laterality Date  . LITHOTRIPSY    . NASAL POLYP EXCISION     Current Outpatient Medications on  File Prior to Visit  Medication Sig Dispense Refill  . allopurinol (ZYLOPRIM) 100 MG tablet TAKE ONE TABLET BY MOUTH TWICE DAILY 60 tablet 11  . aspirin 81 MG tablet Take 81 mg by mouth every evening.     . cloNIDine (CATAPRES) 0.1 MG tablet Take 1 tablet (0.1 mg total) by mouth 2 (two) times daily. 60 tablet 0  . cloNIDine (CATAPRES) 0.1 MG tablet Take 1 tablet (0.1 mg total) 3 (three) times daily by mouth. Needs office visit and labs before further refills 30 tablet 0  . doxazosin (CARDURA) 4 MG tablet TAKE ONE TABLET BY MOUTH ONCE DAILY 30 tablet 11  . hydrALAZINE (APRESOLINE) 25 MG tablet Take 2 tablets (50 mg total) by mouth every 8 (eight) hours. 180 tablet 11  . hydrochlorothiazide (HYDRODIURIL) 25 MG tablet TAKE ONE TABLET BY MOUTH ONCE DAILY 30 tablet 11  . Insulin Glargine (BASAGLAR KWIKPEN) 100 UNIT/ML SOPN Inject 35 Units into the skin daily.     Marland Kitchen  Insulin Glargine (BASAGLAR KWIKPEN) 100 UNIT/ML SOPN INJECT 55 UNITS SUBCUTANEOUSLY ONCE DAILY 15 pen 3  . labetalol (NORMODYNE) 300 MG tablet TAKE ONE TABLET BY MOUTH TWICE DAILY 60 tablet 11  . lisinopril (PRINIVIL,ZESTRIL) 20 MG tablet TAKE ONE TABLET BY MOUTH TWICE DAILY 60 tablet 11  . Multiple Vitamin (MULTIVITAMIN WITH MINERALS) TABS tablet Take 1 tablet by mouth daily.    . potassium chloride SA (K-DUR,KLOR-CON) 20 MEQ tablet Take 1 tablet (20 mEq total) by mouth 3 (three) times daily. 90 tablet 0  . senna-docusate (SENOKOT-S) 8.6-50 MG tablet Take 2 tablets by mouth at bedtime. 60 tablet 0  . simvastatin (ZOCOR) 40 MG tablet Take 1 tablet (40 mg total) by mouth at bedtime. 90 tablet 1  . TRADJENTA 5 MG TABS tablet TAKE 1 TABLET BY MOUTH ONCE DAILY **NEEDS OFFICE VISIT AND LABS BEFORE FURTHER REFILLS** 30 tablet 0   No current facility-administered medications on file prior to visit.    No Known Allergies Social History   Socioeconomic History  . Marital status: Single    Spouse name: Not on file  . Number of children: Not on  file  . Years of education: Not on file  . Highest education level: Not on file  Social Needs  . Financial resource strain: Not on file  . Food insecurity - worry: Not on file  . Food insecurity - inability: Not on file  . Transportation needs - medical: Not on file  . Transportation needs - non-medical: Not on file  Occupational History  . Not on file  Tobacco Use  . Smoking status: Former Research scientist (life sciences)  . Smokeless tobacco: Never Used  Substance and Sexual Activity  . Alcohol use: Yes    Comment: Occasional  . Drug use: Yes    Types: Cocaine, Marijuana    Comment: Past Hx  . Sexual activity: Not on file  Other Topics Concern  . Not on file  Social History Narrative  . Not on file      Review of Systems  All other systems reviewed and are negative.      Objective:   Physical Exam  Constitutional: He appears well-developed and well-nourished.  Cardiovascular: Normal rate, regular rhythm and normal heart sounds.  Pulmonary/Chest: Effort normal and breath sounds normal. No respiratory distress. He has no wheezes. He has no rales.  Abdominal: Soft. Bowel sounds are normal. He exhibits no distension. There is no tenderness. There is no rebound and no guarding.  Skin: No rash noted. No erythema.  Vitals reviewed. Sitting in wheelchair with chronic right hemiplegia.         Assessment & Plan:  Type 2 diabetes mellitus without complication, with long-term current use of insulin (HCC) - Plan: CBC with Differential/Platelet, COMPLETE METABOLIC PANEL WITH GFR, Lipid panel, Hemoglobin A1c, Microalbumin, urine  Benign essential HTN  History of stroke  Pure hypercholesterolemia  Blood pressure today is out of control.  Add amlodipine 10 mg a day and recheck blood pressure in 1 month.  I do not feel comfortable switching this patient to 70/30 insulin despite the cost.  He would have to check his blood sugars more frequently for me to feel comfortable with this to avoid hypoglycemic  episodes.  His noncompliance with medical therapy makes it difficult to manage his diabetes.  I would however be willing to discontinue Tradjenta if he would quit eating the snacks and cookies.  This medication is expensive and honestly probably not providing tremendous benefit to justify the  cost and we could accomplish similar reductions in his blood sugar if he would eat a low carbohydrate diet.  He states that he will consider it.  He received his flu shot today.  I will check a hemoglobin A1c.  His goal hemoglobin A1c is less than 7.  I will also check a urine microalbumin.  Diabetic foot exam was performed

## 2017-06-07 NOTE — Addendum Note (Signed)
Addended by: Shary Decamp B on: 06/07/2017 04:00 PM   Modules accepted: Orders

## 2017-06-08 LAB — CBC WITH DIFFERENTIAL/PLATELET
Basophils Absolute: 38 cells/uL (ref 0–200)
Basophils Relative: 0.4 %
Eosinophils Absolute: 399 cells/uL (ref 15–500)
Eosinophils Relative: 4.2 %
HCT: 36.3 % — ABNORMAL LOW (ref 38.5–50.0)
Hemoglobin: 12.4 g/dL — ABNORMAL LOW (ref 13.2–17.1)
Lymphs Abs: 2014 cells/uL (ref 850–3900)
MCH: 27.9 pg (ref 27.0–33.0)
MCHC: 34.2 g/dL (ref 32.0–36.0)
MCV: 81.8 fL (ref 80.0–100.0)
MPV: 10 fL (ref 7.5–12.5)
Monocytes Relative: 5 %
Neutro Abs: 6574 cells/uL (ref 1500–7800)
Neutrophils Relative %: 69.2 %
Platelets: 231 10*3/uL (ref 140–400)
RBC: 4.44 10*6/uL (ref 4.20–5.80)
RDW: 14.6 % (ref 11.0–15.0)
Total Lymphocyte: 21.2 %
WBC mixed population: 475 cells/uL (ref 200–950)
WBC: 9.5 10*3/uL (ref 3.8–10.8)

## 2017-06-08 LAB — COMPLETE METABOLIC PANEL WITH GFR
AG Ratio: 1.6 (calc) (ref 1.0–2.5)
ALT: 11 U/L (ref 9–46)
AST: 10 U/L (ref 10–35)
Albumin: 3.9 g/dL (ref 3.6–5.1)
Alkaline phosphatase (APISO): 82 U/L (ref 40–115)
BUN/Creatinine Ratio: 17 (calc) (ref 6–22)
BUN: 27 mg/dL — ABNORMAL HIGH (ref 7–25)
CO2: 31 mmol/L (ref 20–32)
Calcium: 9 mg/dL (ref 8.6–10.3)
Chloride: 101 mmol/L (ref 98–110)
Creat: 1.61 mg/dL — ABNORMAL HIGH (ref 0.70–1.33)
GFR, Est African American: 53 mL/min/{1.73_m2} — ABNORMAL LOW (ref >=60)
GFR, Est Non African American: 46 mL/min/{1.73_m2} — ABNORMAL LOW (ref >=60)
Globulin: 2.5 g/dL (calc) (ref 1.9–3.7)
Glucose, Bld: 193 mg/dL — ABNORMAL HIGH (ref 65–99)
Potassium: 3.9 mmol/L (ref 3.5–5.3)
Sodium: 140 mmol/L (ref 135–146)
Total Bilirubin: 0.4 mg/dL (ref 0.2–1.2)
Total Protein: 6.4 g/dL (ref 6.1–8.1)

## 2017-06-08 LAB — LIPID PANEL
Cholesterol: 133 mg/dL (ref <200)
HDL: 22 mg/dL — ABNORMAL LOW (ref >40)
LDL Cholesterol (Calc): 80 mg/dL (calc)
Non-HDL Cholesterol (Calc): 111 mg/dL (calc) (ref <130)
Total CHOL/HDL Ratio: 6 (calc) — ABNORMAL HIGH (ref <5.0)
Triglycerides: 222 mg/dL — ABNORMAL HIGH (ref <150)

## 2017-06-08 LAB — HEMOGLOBIN A1C
Hgb A1c MFr Bld: 8.2 % of total Hgb — ABNORMAL HIGH (ref <5.7)
Mean Plasma Glucose: 189 (calc)
eAG (mmol/L): 10.4 (calc)

## 2017-06-21 ENCOUNTER — Encounter: Payer: Medicare Other | Admitting: Physical Medicine & Rehabilitation

## 2017-06-23 ENCOUNTER — Other Ambulatory Visit: Payer: Self-pay | Admitting: Family Medicine

## 2017-06-23 DIAGNOSIS — I10 Essential (primary) hypertension: Secondary | ICD-10-CM

## 2017-06-27 ENCOUNTER — Telehealth: Payer: Self-pay | Admitting: Family Medicine

## 2017-06-27 NOTE — Telephone Encounter (Signed)
BP readings for pt - she checks them between 2 -4 pm cause that is when he gets up.  175/98,135/79,147/89,160/98,131/83,142/84,143/91,156/90,158/98,162/88,170/80,142/90,150/88,168/74,148/98,165/100,168/101 (she also states that for the last few days he has not felt well with a stomach bug)

## 2017-06-28 NOTE — Telephone Encounter (Signed)
Is supposed to be taking clonidine 0.1 mg potid.  Please verify he is doing that and then increase to 0.2 mg potid.

## 2017-06-28 NOTE — Telephone Encounter (Signed)
Pt is taking the Clonidine .44m 2 qhs and 1 qam - per Dr. PDennard Schaumanntake 2 bid. Pt's sister is aware of recommendations.

## 2017-07-01 ENCOUNTER — Other Ambulatory Visit: Payer: Self-pay | Admitting: Family Medicine

## 2017-07-04 NOTE — Telephone Encounter (Signed)
Medication refilled per protocol. 

## 2017-07-09 ENCOUNTER — Encounter: Payer: Self-pay | Admitting: Occupational Therapy

## 2017-07-09 DIAGNOSIS — R293 Abnormal posture: Secondary | ICD-10-CM

## 2017-07-09 DIAGNOSIS — M6281 Muscle weakness (generalized): Secondary | ICD-10-CM

## 2017-07-09 DIAGNOSIS — R2681 Unsteadiness on feet: Secondary | ICD-10-CM

## 2017-07-09 NOTE — Therapy (Signed)
[facility] [address] [place], [place], [address] Phone: [phone]   Fax:  [phone]  Occupational Therapy Treatment  Patient Details  Name: Eduardo Young MRN: [id number] Date of Birth: February 28, 1956 Referring Provider (Historical): Dr. Naaman Plummer   Encounter Date: 07/09/2017    Past Medical History:  Diagnosis Date  . Chronic venous insufficiency   . Diabetes mellitus   . GERD (gastroesophageal reflux disease)   . Gout   . Hemiparesis ([place])   . Hyperlipidemia   . Hypertension   . Obesity   . Renal calculi   . Sleep apnea, obstructive   . Stroke [facility])    [id number]    Past Surgical History:  Procedure Laterality Date  . LITHOTRIPSY    . NASAL POLYP EXCISION      There were no vitals filed for this visit.                          OT Short Term Goals - 07/09/17 1513      OT SHORT TERM GOAL #1   Title  Pt and sister will be mod I with HEP - 10/19/2016    Status  Achieved      OT SHORT TERM GOAL #2   Title  Pt and sister will be able to identify 2 strategies for normalizing tone    Status  Achieved      OT SHORT TERM GOAL #3   Title  Pt and sister will be mod I with splint wear and care    Status  Achieved      OT SHORT TERM GOAL #4   Title  Pt and sister will be mod I for UE positioing in wheelchair and in bed    Status  Achieved        OT Long Term Goals - 07/09/17 1513      OT LONG TERM GOAL #1   Title  Pt and sister will be mod I with ugraded HEP PRN- 11/16/2016    Status  Unable to assess      OT LONG TERM GOAL #2   Title  Pt and sister will be mod I with edema control techniques for RUE    Status  Achieved      OT LONG TERM GOAL #3   Title  Pt will demonstrate adequate balance for safety and increased ease with basic transfers in the home (toilet and bed)    Status  Unable to assess      OT LONG TERM GOAL #4   Title  Pt will be able to demostrate adequate  standing balance without UE to enage in simple ADL activities.     Status  Unable to assess            Plan - 07/09/17 1513    Clinical Impression Statement  Pt has not returned to therapy since last visit.  WIll d/c from OT today    Rehab Potential  Fair    Current Impairments/barriers affecting progress:  severity of deficits, length of time since onset    OT Frequency  2x / week    OT Duration  8 weeks    OT Treatment/Interventions  Self-care/ADL training;Therapeutic exercise;Neuromuscular education;DME and/or AE instruction;Passive range of motion;Manual Therapy;Functional Mobility Training;Splinting;Therapeutic activities;Patient/family education;Balance training    Plan  d/c from OT as pt has not returned to therapy       Patient will benefit from skilled  therapeutic intervention in order to improve the following deficits and impairments:  Abnormal gait, Decreased activity tolerance, Decreased balance, Decreased cognition, Decreased mobility, Decreased strength, Difficulty walking, Impaired UE functional use, Impaired tone, Impaired sensation, Pain  Visit Diagnosis: Unsteadiness on feet  Muscle weakness (generalized)  Abnormal posture  G-Codes - Jul 13, 2017 1514    Functional Assessment Tool Used (Outpatient only)  clinical judgement    Functional Limitation  Self care    Self Care Current Status (V4360)  At least 20 percent but less than 40 percent impaired, limited or restricted    Self Care Goal Status (O7703)  At least 20 percent but less than 40 percent impaired, limited or restricted    Self Care Discharge Status [phone])  At least 20 percent but less than 40 percent impaired, limited or restricted       Problem List Patient Active Problem List   Diagnosis Date Noted  . Adjustment disorder with mixed anxiety and depressed mood   . Benign essential HTN   . Type 2 diabetes mellitus with peripheral neuropathy (HCC)   . Stage 3 chronic kidney disease ([place])   .  Spastic hemiparesis of right dominant side ([place]) 09/08/2016  . Weakness   . Myoclonus   . Seizures ([place])   . TIA (transient ischemic attack) 09/07/2016  . Chronic venous insufficiency   . History of cerebral infarction ([place]) 10/21/2010  . DM (diabetes mellitus) ([place]) 10/21/2010  . HLD (hyperlipidemia) 10/21/2010  . HTN (hypertension) 10/21/2010  . Sleep apnea 10/21/2010   OCCUPATIONAL THERAPY DISCHARGE SUMMARY  Visits from Start of Care: 12  Current functional level related to goals / functional outcomes: See above   Remaining deficits: Balance deficits, hemiplegia, cognitive deficits, language deficits   Education / Equipment: HEP Plan: Patient agrees to discharge.  Patient goals were partially met. Patient is being discharged due to meeting the stated rehab goals.  ?????      [name] , OTR/L 07-13-2017, 3:15 PM  [facility] [address] [place], [place], [address] Phone: [phone]   Fax:  [phone]  Name: Eduardo Young MRN: [id number] Date of Birth: 1955/09/10

## 2017-07-11 DIAGNOSIS — L602 Onychogryphosis: Secondary | ICD-10-CM | POA: Diagnosis not present

## 2017-07-11 DIAGNOSIS — E1351 Other specified diabetes mellitus with diabetic peripheral angiopathy without gangrene: Secondary | ICD-10-CM | POA: Diagnosis not present

## 2017-07-16 ENCOUNTER — Other Ambulatory Visit: Payer: Self-pay | Admitting: Family Medicine

## 2017-07-16 MED ORDER — CLONIDINE HCL 0.2 MG PO TABS: 0.2000 mg | ORAL_TABLET | Freq: Two times a day (BID) | ORAL | 3 refills | Status: DC

## 2017-07-31 ENCOUNTER — Other Ambulatory Visit: Payer: Self-pay | Admitting: Family Medicine

## 2017-07-31 DIAGNOSIS — I10 Essential (primary) hypertension: Secondary | ICD-10-CM

## 2017-09-03 ENCOUNTER — Other Ambulatory Visit: Payer: Self-pay

## 2017-09-03 ENCOUNTER — Ambulatory Visit (INDEPENDENT_AMBULATORY_CARE_PROVIDER_SITE_OTHER): Payer: Medicare Other | Admitting: Physician Assistant

## 2017-09-03 ENCOUNTER — Encounter: Payer: Self-pay | Admitting: Physician Assistant

## 2017-09-03 VITALS — BP 146/92 | HR 67 | Temp 97.4°F | Resp 14

## 2017-09-03 DIAGNOSIS — J988 Other specified respiratory disorders: Secondary | ICD-10-CM | POA: Diagnosis not present

## 2017-09-03 DIAGNOSIS — B9689 Other specified bacterial agents as the cause of diseases classified elsewhere: Principal | ICD-10-CM

## 2017-09-03 MED ORDER — AMOXICILLIN-POT CLAVULANATE 875-125 MG PO TABS: 1.0000 | ORAL_TABLET | Freq: Two times a day (BID) | ORAL | 0 refills | Status: AC

## 2017-09-03 NOTE — Progress Notes (Signed)
Patient ID: Eduardo Young MRN: [id number], DOB: Dec 03, 1955, 62 y.o. Date of Encounter: 09/03/2017, 2:50 PM    Chief Complaint:  Chief Complaint  Patient presents with  . Cough    x80month . Nasal Congestion     HPI: 62y.o. year old male presents with above.    They report that he has not had any cough or chest congestion.  They report that he has had all congestion in his head and nose.  Wife states that they both got a cold about a month ago.  Says that her symptoms resolved within 1 week.  However patient's symptoms have persisted for a whole month now.  She has been giving Mucinex, Alka-Seltzer cold and sinus, nasal saline, nasal decongestant spray.   Has had no known fevers or chills.  No significant sore throat.  Home Meds:   Outpatient Medications Prior to Visit  Medication Sig Dispense Refill  . allopurinol (ZYLOPRIM) 100 MG tablet TAKE ONE TABLET BY MOUTH TWICE DAILY 60 tablet 11  . amLODipine (NORVASC) 10 MG tablet Take 1 tablet (10 mg total) daily by mouth. 90 tablet 3  . aspirin 81 MG tablet Take 81 mg by mouth every evening.     .Marland Kitchenatorvastatin (LIPITOR) 40 MG tablet Take 1 tablet (40 mg total) daily by mouth. Stop simvastatin 90 tablet 3  . cloNIDine (CATAPRES) 0.2 MG tablet Take 1 tablet (0.2 mg total) by mouth 2 (two) times daily. 60 tablet 3  . doxazosin (CARDURA) 4 MG tablet TAKE ONE TABLET BY MOUTH ONCE DAILY 30 tablet 11  . hydrALAZINE (APRESOLINE) 25 MG tablet Take 2 tablets (50 mg total) by mouth every 8 (eight) hours. 180 tablet 11  . hydrochlorothiazide (HYDRODIURIL) 25 MG tablet TAKE ONE TABLET BY MOUTH ONCE DAILY 30 tablet 11  . Insulin Glargine (BASAGLAR KWIKPEN) 100 UNIT/ML SOPN INJECT 55 UNITS SUBCUTANEOUSLY ONCE DAILY (Patient taking differently: INJECT 45 UNITS SUBCUTANEOUSLY ONCE DAILY) 15 pen 3  . labetalol (NORMODYNE) 300 MG tablet TAKE ONE TABLET BY MOUTH TWICE DAILY 60 tablet 11  . lisinopril (PRINIVIL,ZESTRIL) 20 MG tablet TAKE ONE TABLET BY  MOUTH TWICE DAILY 60 tablet 11  . Multiple Vitamin (MULTIVITAMIN WITH MINERALS) TABS tablet Take 1 tablet by mouth daily.    . TRADJENTA 5 MG TABS tablet TAKE 1 TABLET BY MOUTH ONCE DAILY **NEEDS OFFICE VISIT AND LABS BEFORE FURTHER REFILLS** 90 tablet 0  . potassium chloride SA (K-DUR,KLOR-CON) 20 MEQ tablet Take 1 tablet (20 mEq total) by mouth 3 (three) times daily. (Patient not taking: Reported on 09/03/2017) 90 tablet 0  . senna-docusate (SENOKOT-S) 8.6-50 MG tablet Take 2 tablets by mouth at bedtime. (Patient not taking: Reported on 09/03/2017) 60 tablet 0  . simvastatin (ZOCOR) 40 MG tablet Take 1 tablet (40 mg total) by mouth at bedtime. (Patient not taking: Reported on 09/03/2017) 90 tablet 1   No facility-administered medications prior to visit.     Allergies: No Known Allergies    Review of Systems: See HPI for pertinent ROS. All other ROS negative.    Physical Exam: Blood pressure (!) 146/92, pulse 67, temperature (!) 97.4 F (36.3 C), temperature source Oral, resp. rate 14, SpO2 96 %., There is no height or weight on file to calculate BMI. General:  WM in Wheelchair. Appears in no acute distress. HEENT: Normocephalic, atraumatic, eyes without discharge, sclera non-icteric, nares are without discharge. Bilateral auditory canals obstructed with cerumen. Oral cavity moist, posterior pharynx without exudate, erythema, peritonsillar  abscess.  No tenderness with percussion to frontal or maxillary sinuses bilaterally.  Neck: Supple. No thyromegaly. No lymphadenopathy. Lungs: Clear bilaterally to auscultation without wheezes, rales, or rhonchi. Breathing is unlabored. Heart: Regular rhythm. No murmurs, rubs, or gallops. Msk:  Strength and tone normal for age. Extremities/Skin: Warm and dry.  Neuro: Alert and oriented X 3. Moves all extremities spontaneously. Gait is normal. CNII-XII grossly in tact. Psych:  Responds to questions appropriately with a normal affect.     ASSESSMENT AND  PLAN:  62 y.o. year old male with  1. Bacterial respiratory infection To take Augmentin as directed and complete all 10 days.  Follow up if symptoms do not resolve with the completion of antibiotic. - amoxicillin-clavulanate (AUGMENTIN) 875-125 MG tablet; Take 1 tablet by mouth 2 (two) times daily for 10 days.  Dispense: 20 tablet; Refill: 0  For cerumen impaction he is to apply over-the-counter drops to help loosen this.  Signed, [address] [place], [place], [facility] 09/03/2017 2:50 PM

## 2017-09-17 ENCOUNTER — Ambulatory Visit (INDEPENDENT_AMBULATORY_CARE_PROVIDER_SITE_OTHER): Payer: Medicare Other | Admitting: Family Medicine

## 2017-09-17 ENCOUNTER — Encounter: Payer: Self-pay | Admitting: Family Medicine

## 2017-09-17 VITALS — BP 130/60 | HR 66 | Temp 98.0°F | Resp 18

## 2017-09-17 DIAGNOSIS — J019 Acute sinusitis, unspecified: Secondary | ICD-10-CM

## 2017-09-17 MED ORDER — CEFDINIR 300 MG PO CAPS: 300.0000 mg | ORAL_CAPSULE | Freq: Two times a day (BID) | ORAL | 0 refills | Status: DC

## 2017-09-17 MED ORDER — FLUTICASONE PROPIONATE 50 MCG/ACT NA SUSP: 2.0000 | Freq: Every day | NASAL | 6 refills | Status: DC

## 2017-09-17 NOTE — Progress Notes (Signed)
Subjective:    Patient ID: Eduardo Young, male    DOB: 20-May-1956, 62 y.o.   MRN: [id number]  HPI Patient was treated 2 weeks ago for a sinus infection with Augmentin.  He continues to have severe nasal congestion.  He states that he cannot breathe at night unless he uses his Afrin.  Within minutes of using Afrin, his nose will open up and he can breathe again however it wears off after 3 or 4 hours.  He denies any fevers or chills.  He denies any sinus pain.  He reports nasal congestion and sinus pressure.  When I asked him how long he has been on Afrin, he states that he has been taking it for 4-5 weeks.  Is the only thing that will provide him any relief.  He does complain of pressure in both maxillary sinuses Past Medical History:  Diagnosis Date  . Chronic venous insufficiency   . Diabetes mellitus   . GERD (gastroesophageal reflux disease)   . Gout   . Hemiparesis ([place])   . Hyperlipidemia   . Hypertension   . Obesity   . Renal calculi   . Sleep apnea, obstructive   . Stroke [facility])    [id number]   Past Surgical History:  Procedure Laterality Date  . LITHOTRIPSY    . NASAL POLYP EXCISION     Current Outpatient Medications on File Prior to Visit  Medication Sig Dispense Refill  . allopurinol (ZYLOPRIM) 100 MG tablet TAKE ONE TABLET BY MOUTH TWICE DAILY 60 tablet 11  . amLODipine (NORVASC) 10 MG tablet Take 1 tablet (10 mg total) daily by mouth. 90 tablet 3  . aspirin 81 MG tablet Take 81 mg by mouth every evening.     Marland Kitchen atorvastatin (LIPITOR) 40 MG tablet Take 1 tablet (40 mg total) daily by mouth. Stop simvastatin 90 tablet 3  . cloNIDine (CATAPRES) 0.2 MG tablet Take 1 tablet (0.2 mg total) by mouth 2 (two) times daily. 60 tablet 3  . doxazosin (CARDURA) 4 MG tablet TAKE ONE TABLET BY MOUTH ONCE DAILY 30 tablet 11  . hydrALAZINE (APRESOLINE) 25 MG tablet Take 2 tablets (50 mg total) by mouth every 8 (eight) hours. 180 tablet 11  . hydrochlorothiazide (HYDRODIURIL) 25 MG tablet  TAKE ONE TABLET BY MOUTH ONCE DAILY 30 tablet 11  . Insulin Glargine (BASAGLAR KWIKPEN) 100 UNIT/ML SOPN INJECT 55 UNITS SUBCUTANEOUSLY ONCE DAILY (Patient taking differently: INJECT 45 UNITS SUBCUTANEOUSLY ONCE DAILY) 15 pen 3  . labetalol (NORMODYNE) 300 MG tablet TAKE ONE TABLET BY MOUTH TWICE DAILY 60 tablet 11  . lisinopril (PRINIVIL,ZESTRIL) 20 MG tablet TAKE ONE TABLET BY MOUTH TWICE DAILY 60 tablet 11  . Multiple Vitamin (MULTIVITAMIN WITH MINERALS) TABS tablet Take 1 tablet by mouth daily.    . potassium chloride SA (K-DUR,KLOR-CON) 20 MEQ tablet Take 1 tablet (20 mEq total) by mouth 3 (three) times daily. 90 tablet 0  . senna-docusate (SENOKOT-S) 8.6-50 MG tablet Take 2 tablets by mouth at bedtime. 60 tablet 0  . simvastatin (ZOCOR) 40 MG tablet Take 1 tablet (40 mg total) by mouth at bedtime. 90 tablet 1  . TRADJENTA 5 MG TABS tablet TAKE 1 TABLET BY MOUTH ONCE DAILY **NEEDS OFFICE VISIT AND LABS BEFORE FURTHER REFILLS** 90 tablet 0   No current facility-administered medications on file prior to visit.    No Known Allergies Social History   Socioeconomic History  . Marital status: Single    Spouse name: Not on file  .  Number of children: Not on file  . Years of education: Not on file  . Highest education level: Not on file  Social Needs  . Financial resource strain: Not on file  . Food insecurity - worry: Not on file  . Food insecurity - inability: Not on file  . Transportation needs - medical: Not on file  . Transportation needs - non-medical: Not on file  Occupational History  . Not on file  Tobacco Use  . Smoking status: Former Research scientist (life sciences)  . Smokeless tobacco: Never Used  Substance and Sexual Activity  . Alcohol use: Yes    Comment: Occasional  . Drug use: Yes    Types: Cocaine, Marijuana    Comment: Past Hx  . Sexual activity: Not on file  Other Topics Concern  . Not on file  Social History Narrative  . Not on file      Review of Systems  All other systems  reviewed and are negative.      Objective:   Physical Exam  Constitutional: He appears well-developed and well-nourished.  HENT:  Right Ear: External ear normal.  Left Ear: External ear normal.  Nose: Mucosal edema present. No rhinorrhea. Right sinus exhibits maxillary sinus tenderness. Left sinus exhibits maxillary sinus tenderness.  Mouth/Throat: Oropharynx is clear and moist. No oropharyngeal exudate.  Eyes: Conjunctivae are normal. Pupils are equal, round, and reactive to light.  Neck: Neck supple.  Cardiovascular: Normal rate, regular rhythm and normal heart sounds.  No murmur heard. Pulmonary/Chest: Effort normal and breath sounds normal. No respiratory distress. He has no wheezes. He has no rales. He exhibits no tenderness.  Lymphadenopathy:    He has no cervical adenopathy.  Vitals reviewed.         Assessment & Plan:  Acute rhinosinusitis  Patient has sinusitis versus rhinitis medicamentosa.  Recommended discontinuation of Afrin.  Explained to the patient this will likely cause rebound nasal congestion and rhinorrhea that he is going to have to endure.   Can use Flonase 2 sprays each nostril daily to mitigate this process.  Cannot use prednisone because of hypertension and insulin-dependent diabetes.  In case this is rhinosinusitis I will also give him Omnicef 300 mg p.o. twice daily for 10 days.

## 2017-09-19 DIAGNOSIS — E1351 Other specified diabetes mellitus with diabetic peripheral angiopathy without gangrene: Secondary | ICD-10-CM | POA: Diagnosis not present

## 2017-09-19 DIAGNOSIS — L602 Onychogryphosis: Secondary | ICD-10-CM | POA: Diagnosis not present

## 2017-09-27 ENCOUNTER — Other Ambulatory Visit: Payer: Self-pay | Admitting: Family Medicine

## 2017-09-27 MED ORDER — DOXAZOSIN MESYLATE 4 MG PO TABS: 4.0000 mg | ORAL_TABLET | Freq: Every day | ORAL | 3 refills | Status: DC

## 2017-09-27 MED ORDER — LABETALOL HCL 300 MG PO TABS: 300.0000 mg | ORAL_TABLET | Freq: Two times a day (BID) | ORAL | 3 refills | Status: DC

## 2017-09-27 MED ORDER — POTASSIUM CHLORIDE CRYS ER 20 MEQ PO TBCR: 20.0000 meq | EXTENDED_RELEASE_TABLET | Freq: Three times a day (TID) | ORAL | 3 refills | Status: DC

## 2017-09-27 MED ORDER — LISINOPRIL 20 MG PO TABS: 20.0000 mg | ORAL_TABLET | Freq: Two times a day (BID) | ORAL | 3 refills | Status: DC

## 2017-09-27 MED ORDER — ALLOPURINOL 100 MG PO TABS: 100.0000 mg | ORAL_TABLET | Freq: Two times a day (BID) | ORAL | 3 refills | Status: DC

## 2017-09-30 ENCOUNTER — Other Ambulatory Visit: Payer: Self-pay | Admitting: Family Medicine

## 2017-10-04 ENCOUNTER — Telehealth: Payer: Self-pay | Admitting: *Deleted

## 2017-10-04 MED ORDER — LEVOFLOXACIN 500 MG PO TABS: 500.0000 mg | ORAL_TABLET | Freq: Every day | ORAL | 0 refills | Status: DC

## 2017-10-04 NOTE — Telephone Encounter (Signed)
Pt is not using Afrin just Flonase. Levaquin sent to pharm and pt aware

## 2017-10-04 NOTE — Telephone Encounter (Signed)
Is he still using afrin?  He has been on augmentin and omnicef.  Try levaquin 500 poqday for 7 days

## 2017-10-04 NOTE — Telephone Encounter (Signed)
Received call from patient.   Reports that he was seen on 09/17/2017 for sinusitis. States that he completed all ABTx and has used the [facility]. Reports that Sx have not resolved and is requesting MD to prescribe ABTx.   MD please advise.

## 2017-10-20 ENCOUNTER — Other Ambulatory Visit: Payer: Self-pay | Admitting: Family Medicine

## 2017-10-29 ENCOUNTER — Other Ambulatory Visit: Payer: Self-pay | Admitting: Family Medicine

## 2017-11-01 ENCOUNTER — Other Ambulatory Visit: Payer: Self-pay | Admitting: Family Medicine

## 2017-11-01 DIAGNOSIS — I10 Essential (primary) hypertension: Secondary | ICD-10-CM

## 2017-11-14 ENCOUNTER — Other Ambulatory Visit: Payer: Self-pay | Admitting: Family Medicine

## 2017-11-28 DIAGNOSIS — E1351 Other specified diabetes mellitus with diabetic peripheral angiopathy without gangrene: Secondary | ICD-10-CM | POA: Diagnosis not present

## 2017-11-28 DIAGNOSIS — L602 Onychogryphosis: Secondary | ICD-10-CM | POA: Diagnosis not present

## 2017-12-25 ENCOUNTER — Other Ambulatory Visit: Payer: Self-pay | Admitting: Family Medicine

## 2017-12-25 MED ORDER — LISINOPRIL 20 MG PO TABS: 20.0000 mg | ORAL_TABLET | Freq: Two times a day (BID) | ORAL | 3 refills | Status: DC

## 2017-12-31 ENCOUNTER — Other Ambulatory Visit: Payer: Self-pay | Admitting: Family Medicine

## 2018-01-07 ENCOUNTER — Other Ambulatory Visit: Payer: Self-pay | Admitting: Family Medicine

## 2018-01-28 ENCOUNTER — Other Ambulatory Visit: Payer: Self-pay | Admitting: Family Medicine

## 2018-01-28 MED ORDER — LISINOPRIL 20 MG PO TABS: 20.0000 mg | ORAL_TABLET | Freq: Two times a day (BID) | ORAL | 3 refills | Status: DC

## 2018-02-06 DIAGNOSIS — L602 Onychogryphosis: Secondary | ICD-10-CM | POA: Diagnosis not present

## 2018-02-06 DIAGNOSIS — E1351 Other specified diabetes mellitus with diabetic peripheral angiopathy without gangrene: Secondary | ICD-10-CM | POA: Diagnosis not present

## 2018-02-18 ENCOUNTER — Other Ambulatory Visit: Payer: Self-pay | Admitting: Family Medicine

## 2018-04-03 ENCOUNTER — Other Ambulatory Visit: Payer: Self-pay | Admitting: Family Medicine

## 2018-05-08 DIAGNOSIS — L602 Onychogryphosis: Secondary | ICD-10-CM | POA: Diagnosis not present

## 2018-05-08 DIAGNOSIS — E1351 Other specified diabetes mellitus with diabetic peripheral angiopathy without gangrene: Secondary | ICD-10-CM | POA: Diagnosis not present

## 2018-05-17 ENCOUNTER — Other Ambulatory Visit: Payer: Self-pay | Admitting: Family Medicine

## 2018-06-01 ENCOUNTER — Other Ambulatory Visit: Payer: Self-pay | Admitting: Family Medicine

## 2018-06-30 ENCOUNTER — Other Ambulatory Visit: Payer: Self-pay | Admitting: Family Medicine

## 2018-07-15 DIAGNOSIS — L602 Onychogryphosis: Secondary | ICD-10-CM | POA: Diagnosis not present

## 2018-07-15 DIAGNOSIS — E1351 Other specified diabetes mellitus with diabetic peripheral angiopathy without gangrene: Secondary | ICD-10-CM | POA: Diagnosis not present

## 2018-07-26 ENCOUNTER — Other Ambulatory Visit: Payer: Self-pay | Admitting: Family Medicine

## 2018-07-26 DIAGNOSIS — I10 Essential (primary) hypertension: Secondary | ICD-10-CM

## 2018-08-01 ENCOUNTER — Other Ambulatory Visit: Payer: Self-pay | Admitting: *Deleted

## 2018-08-01 MED ORDER — INSULIN GLARGINE 100 UNIT/ML SOLOSTAR PEN: 45.0000 [IU] | PEN_INJECTOR | Freq: Every day | SUBCUTANEOUS | 11 refills | Status: DC

## 2018-08-16 ENCOUNTER — Other Ambulatory Visit: Payer: Self-pay | Admitting: Family Medicine

## 2018-08-26 ENCOUNTER — Ambulatory Visit (INDEPENDENT_AMBULATORY_CARE_PROVIDER_SITE_OTHER): Payer: Medicare Other | Admitting: Family Medicine

## 2018-08-26 ENCOUNTER — Encounter: Payer: Self-pay | Admitting: Family Medicine

## 2018-08-26 VITALS — BP 140/70 | HR 66 | Temp 98.7°F | Resp 18

## 2018-08-26 DIAGNOSIS — Z794 Long term (current) use of insulin: Secondary | ICD-10-CM | POA: Diagnosis not present

## 2018-08-26 DIAGNOSIS — E119 Type 2 diabetes mellitus without complications: Secondary | ICD-10-CM

## 2018-08-26 DIAGNOSIS — L989 Disorder of the skin and subcutaneous tissue, unspecified: Secondary | ICD-10-CM

## 2018-08-26 DIAGNOSIS — IMO0001 Reserved for inherently not codable concepts without codable children: Secondary | ICD-10-CM

## 2018-08-26 DIAGNOSIS — D2262 Melanocytic nevi of left upper limb, including shoulder: Secondary | ICD-10-CM | POA: Diagnosis not present

## 2018-08-26 DIAGNOSIS — K921 Melena: Secondary | ICD-10-CM

## 2018-08-26 DIAGNOSIS — D225 Melanocytic nevi of trunk: Secondary | ICD-10-CM | POA: Diagnosis not present

## 2018-08-26 NOTE — Progress Notes (Signed)
Subjective:    Patient ID: Eduardo Young, male    DOB: Apr 18, 1956, 62 y.o.   MRN: [id number]  HPI Patient has not been seen in more than a year.  He has never had a colonoscopy.  Patient has a history of insulin-dependent diabetes mellitus.  He is overdue for an A1c.  He states for the last 4 to 5 weeks, he has been seeing frank blood in the stool.  At times it is mixed in with the stool.  At other times it smeared on the outside but it is always bright red blood.  He denies any pain with defecation or abdominal pain.  He denies any dizziness, lightheadedness, or fatigue.  He denies any chest pain or shortness of breath.  He denies any syncope.  He also has 2 moles on his body that have been concerned.  One is a 6 mm mole on his left shoulder adjacent to a tattoo that is changing and growing in size.  The second is a 5 to 6 mm mole directly below his right nipple on his upper abdomen that is also changing and growing in size according to the patient.  He is requesting a biopsy of both of these lesions today Past Medical History:  Diagnosis Date  . Chronic venous insufficiency   . Diabetes mellitus   . GERD (gastroesophageal reflux disease)   . Gout   . Hemiparesis ([place])   . Hyperlipidemia   . Hypertension   . Obesity   . Renal calculi   . Sleep apnea, obstructive   . Stroke [facility])    [id number]   Past Surgical History:  Procedure Laterality Date  . LITHOTRIPSY    . NASAL POLYP EXCISION     Current Outpatient Medications on File Prior to Visit  Medication Sig Dispense Refill  . allopurinol (ZYLOPRIM) 100 MG tablet Take 1 tablet (100 mg total) by mouth 2 (two) times daily. 180 tablet 3  . amLODipine (NORVASC) 10 MG tablet TAKE 1 TABLET BY MOUTH ONCE DAILY 90 tablet 3  . aspirin 81 MG tablet Take 81 mg by mouth every evening.     Marland Kitchen atorvastatin (LIPITOR) 40 MG tablet TAKE 1 TABLET BY MOUTH ONCE DAILY *STOP  SIMVASTATIN 90 tablet 0  . cefdinir (OMNICEF) 300 MG capsule Take 1 capsule (300  mg total) by mouth 2 (two) times daily. 20 capsule 0  . cloNIDine (CATAPRES) 0.2 MG tablet TAKE 1 TABLET BY MOUTH TWICE DAILY 180 tablet 0  . doxazosin (CARDURA) 4 MG tablet Take 1 tablet (4 mg total) by mouth daily. 90 tablet 3  . fluticasone (FLONASE) 50 MCG/ACT nasal spray Place 2 sprays into both nostrils daily. 16 g 6  . hydrALAZINE (APRESOLINE) 25 MG tablet Take 2 tablets (50 mg total) by mouth every 8 (eight) hours. 180 tablet 11  . hydrALAZINE (APRESOLINE) 25 MG tablet TAKE 2 TABLETS BY MOUTH THREE TIMES DAILY 180 tablet 11  . hydrochlorothiazide (HYDRODIURIL) 25 MG tablet Take 1 tablet (25 mg total) by mouth daily. Needs office visit and labs before further refills 30 tablet 0  . Insulin Glargine (LANTUS SOLOSTAR) 100 UNIT/ML Solostar Pen Inject 45 Units into the skin daily. 5 pen 11  . labetalol (NORMODYNE) 300 MG tablet Take 1 tablet (300 mg total) by mouth 2 (two) times daily. 180 tablet 3  . levofloxacin (LEVAQUIN) 500 MG tablet Take 1 tablet (500 mg total) by mouth daily. 7 tablet 0  . lisinopril (PRINIVIL,ZESTRIL) 20 MG tablet  Take 1 tablet (20 mg total) by mouth 2 (two) times daily. 180 tablet 3  . Multiple Vitamin (MULTIVITAMIN WITH MINERALS) TABS tablet Take 1 tablet by mouth daily.    . potassium chloride SA (K-DUR,KLOR-CON) 20 MEQ tablet Take 1 tablet (20 mEq total) by mouth 3 (three) times daily. 90 tablet 3  . senna-docusate (SENOKOT-S) 8.6-50 MG tablet Take 2 tablets by mouth at bedtime. 60 tablet 0  . simvastatin (ZOCOR) 40 MG tablet Take 1 tablet (40 mg total) by mouth at bedtime. 90 tablet 1  . TRADJENTA 5 MG TABS tablet TAKE 1 TABLET BY MOUTH ONCE DAILY 90 tablet 0   No current facility-administered medications on file prior to visit.    No Known Allergies Social History   Socioeconomic History  . Marital status: Single    Spouse name: Not on file  . Number of children: Not on file  . Years of education: Not on file  . Highest education level: Not on file    Occupational History  . Not on file  Social Needs  . Financial resource strain: Not on file  . Food insecurity:    Worry: Not on file    Inability: Not on file  . Transportation needs:    Medical: Not on file    Non-medical: Not on file  Tobacco Use  . Smoking status: Former Research scientist (life sciences)  . Smokeless tobacco: Never Used  Substance and Sexual Activity  . Alcohol use: Yes    Comment: Occasional  . Drug use: Yes    Types: Cocaine, Marijuana    Comment: Past Hx  . Sexual activity: Not on file  Lifestyle  . Physical activity:    Days per week: Not on file    Minutes per session: Not on file  . Stress: Not on file  Relationships  . Social connections:    Talks on phone: Not on file    Gets together: Not on file    Attends religious service: Not on file    Active member of club or organization: Not on file    Attends meetings of clubs or organizations: Not on file    Relationship status: Not on file  . Intimate partner violence:    Fear of current or ex partner: Not on file    Emotionally abused: Not on file    Physically abused: Not on file    Forced sexual activity: Not on file  Other Topics Concern  . Not on file  Social History Narrative  . Not on file      Review of Systems  All other systems reviewed and are negative.      Objective:   Physical Exam  Constitutional: He appears well-developed and well-nourished. No distress.  HENT:  Mouth/Throat: Oropharynx is clear and moist.  Eyes: Pupils are equal, round, and reactive to light. Conjunctivae are normal.  Neck: Neck supple.  Cardiovascular: Normal rate, regular rhythm and normal heart sounds.  No murmur heard. Pulmonary/Chest: Effort normal and breath sounds normal. No respiratory distress. He has no wheezes. He has no rales. He exhibits no tenderness.  Abdominal: Soft. Bowel sounds are normal. He exhibits no distension. There is no abdominal tenderness. There is no rebound and no guarding.  Genitourinary:     Prostate normal.  Rectum:     Guaiac result positive.     No rectal mass, anal fissure, tenderness, external hemorrhoid, internal hemorrhoid or abnormal anal tone.   Skin: He is not diaphoretic.  Vitals reviewed.     Skin lesion of left arm - Plan: Pathology  Skin lesion of chest wall - Plan: Pathology  Blood in stool - Plan: Ambulatory referral to Gastroenterology  IDDM (insulin dependent diabetes mellitus) ([place]) - Plan: Hemoglobin A1c, CBC with Differential/Platelet, COMPLETE METABOLIC PANEL WITH GFR  I anesthetized the lesion on his left shoulder was 0.1% lidocaine with epinephrine.  Shave biopsy was performed.  Hemostasis was achieved with Drysol and a Band-Aid and lesion was sent to pathology in a labeled container.  I then anesthetized the lesion directly below his right nipple on his upper abdomen with 0.1% lidocaine with epinephrine.  Using sterile technique a shave biopsy was performed and the lesion was sent to pathology labeled container.  Hemostasis was achieved with Drysol and a Band-Aid.  Regarding this blood in the stool, rectal exam was performed today which is normal.  There are no palpable lesions.  However the patient needs a colonoscopy as soon as possible.  I will also check a CBC to evaluate for any signs of significant anemia.  While checking lab work I will also obtain a CMP and an A1c to monitor the management of his diabetes.  Follow-up with GI as soon as possible.

## 2018-08-27 ENCOUNTER — Encounter: Payer: Self-pay | Admitting: Internal Medicine

## 2018-08-27 LAB — COMPLETE METABOLIC PANEL WITH GFR
AG Ratio: 1.7 (calc) (ref 1.0–2.5)
ALT: 15 U/L (ref 9–46)
AST: 12 U/L (ref 10–35)
Albumin: 3.8 g/dL (ref 3.6–5.1)
Alkaline phosphatase (APISO): 96 U/L (ref 35–144)
BUN/Creatinine Ratio: 17 (calc) (ref 6–22)
BUN: 27 mg/dL — ABNORMAL HIGH (ref 7–25)
CO2: 28 mmol/L (ref 20–32)
Calcium: 8.6 mg/dL (ref 8.6–10.3)
Chloride: 99 mmol/L (ref 98–110)
Creat: 1.58 mg/dL — ABNORMAL HIGH (ref 0.70–1.25)
GFR, Est African American: 54 mL/min/{1.73_m2} — ABNORMAL LOW (ref >=60)
GFR, Est Non African American: 47 mL/min/{1.73_m2} — ABNORMAL LOW (ref >=60)
Globulin: 2.3 g/dL (calc) (ref 1.9–3.7)
Glucose, Bld: 259 mg/dL — ABNORMAL HIGH (ref 65–99)
Potassium: 3.4 mmol/L — ABNORMAL LOW (ref 3.5–5.3)
Sodium: 139 mmol/L (ref 135–146)
Total Bilirubin: 0.4 mg/dL (ref 0.2–1.2)
Total Protein: 6.1 g/dL (ref 6.1–8.1)

## 2018-08-27 LAB — CBC WITH DIFFERENTIAL/PLATELET
Absolute Monocytes: 706 cells/uL (ref 200–950)
Basophils Absolute: 45 cells/uL (ref 0–200)
Basophils Relative: 0.4 %
Eosinophils Absolute: 694 cells/uL — ABNORMAL HIGH (ref 15–500)
Eosinophils Relative: 6.2 %
HCT: 36.9 % — ABNORMAL LOW (ref 38.5–50.0)
Hemoglobin: 12.5 g/dL — ABNORMAL LOW (ref 13.2–17.1)
Lymphs Abs: 1646 cells/uL (ref 850–3900)
MCH: 28.7 pg (ref 27.0–33.0)
MCHC: 33.9 g/dL (ref 32.0–36.0)
MCV: 84.8 fL (ref 80.0–100.0)
MPV: 10.7 fL (ref 7.5–12.5)
Monocytes Relative: 6.3 %
Neutro Abs: 8109 cells/uL — ABNORMAL HIGH (ref 1500–7800)
Neutrophils Relative %: 72.4 %
Platelets: 228 10*3/uL (ref 140–400)
RBC: 4.35 10*6/uL (ref 4.20–5.80)
RDW: 14.7 % (ref 11.0–15.0)
Total Lymphocyte: 14.7 %
WBC: 11.2 10*3/uL — ABNORMAL HIGH (ref 3.8–10.8)

## 2018-08-27 LAB — HEMOGLOBIN A1C
Hgb A1c MFr Bld: 10.9 % of total Hgb — ABNORMAL HIGH (ref <5.7)
Mean Plasma Glucose: 266 (calc)
eAG (mmol/L): 14.7 (calc)

## 2018-08-28 ENCOUNTER — Other Ambulatory Visit: Payer: Self-pay | Admitting: Family Medicine

## 2018-08-28 DIAGNOSIS — I10 Essential (primary) hypertension: Secondary | ICD-10-CM

## 2018-08-29 LAB — TISSUE PATH REPORT

## 2018-08-29 LAB — PATHOLOGY

## 2018-09-02 ENCOUNTER — Ambulatory Visit (INDEPENDENT_AMBULATORY_CARE_PROVIDER_SITE_OTHER): Payer: Medicare Other | Admitting: Internal Medicine

## 2018-09-02 ENCOUNTER — Encounter: Payer: Self-pay | Admitting: Internal Medicine

## 2018-09-02 VITALS — BP 112/58 | HR 84

## 2018-09-02 DIAGNOSIS — K625 Hemorrhage of anus and rectum: Secondary | ICD-10-CM

## 2018-09-02 NOTE — H&P (View-Only) (Signed)
   Subjective:    Patient ID: Eduardo Young, male    DOB: 1956/05/24, 63 y.o.   MRN: [id number]  HPI Eduardo Young is a 63 year old male with a past medical history of hemorrhagic stroke with right hemiparesis, sleep apnea, hypertension, hyperlipidemia, obesity, gout who is seen in consultation at the request of Dr. Dennard Schaumann to evaluate rectal bleeding.  He is here today with his sister.  He reports that he has developed rectal bleeding intermittently over the last 4 to 6 weeks.  Blood described as maroon at times.  Bowel movements have always been irregular maybe 2 times per week.  Stools at times can be hard and he can require straining for defecation.  He denies pain with passing stool.  Has seen some blood with wiping.  Denies abdominal pain.  Denies upper GI and hepatobiliary complaint.  No prior colonoscopy.  His sister may have had a colon polyp.  No family history of colon cancer.  He is not taking blood thinners.   Review of Systems As per HPI, otherwise negative  Current Medications, Allergies, Past Medical History, Past Surgical History, Family History and Social History were reviewed in [employer] record.     Objective:   Physical Exam BP (!) 112/58   Pulse 84  Constitutional: Well-developed and well-nourished. No distress. HEENT: Normocephalic and atraumatic.  No scleral icterus. Neck: Neck supple. Trachea midline. Cardiovascular: Normal rate, regular rhythm and intact distal pulses. No M/R/G Pulmonary/chest: Effort normal and breath sounds normal. No wheezing, rales or rhonchi. Abdominal: Soft, obese, nontender, nondistended. Bowel sounds active throughout.  Extremities: no clubbing, cyanosis, or edema Neurological: Alert and oriented to person place and time.  Right hemiparesis Skin: Skin is warm and dry. Psychiatric: Normal mood and affect. Behavior is normal.  CBC    Component Value Date/Time   WBC 11.2 (H) 08/26/2018 1507   RBC 4.35  08/26/2018 1507   HGB 12.5 (L) 08/26/2018 1507   HCT 36.9 (L) 08/26/2018 1507   PLT 228 08/26/2018 1507   MCV 84.8 08/26/2018 1507   MCH 28.7 08/26/2018 1507   MCHC 33.9 08/26/2018 1507   RDW 14.7 08/26/2018 1507   LYMPHSABS 1,646 08/26/2018 1507   MONOABS 425 09/28/2016 1054   EOSABS 694 (H) 08/26/2018 1507   BASOSABS 45 08/26/2018 1507       Assessment & Plan:  63 year old male with a past medical history of hemorrhagic stroke with right hemiparesis, sleep apnea, hypertension, hyperlipidemia, obesity, gout who is seen in consultation at the request of Dr. Dennard Schaumann to evaluate rectal bleeding.  1.  Rectal bleeding --I have recommended that we proceed to colonoscopy to evaluate his rectal bleeding.  This will also suffice for screening.  I recommended that we perform this in the outpatient hospital setting due to his limited mobility and prior stroke.  We spent considerable time today discussing the procedure, the sedation and the bowel preparation.  Prep will be more difficult for him given his limited mobility post stroke.  He is currently living in an apartment with his sister and they feel that they can use the bedside toilet to help with the bowel prep.  I will do a non-split Plenvu prep the night before procedure.  I explained to him the importance of having a good or excellent prep so that the views at colonoscopy are adequate.  We did discuss the risk, benefits and alternatives to colonoscopy and he is agreeable and wishes to proceed.  CC: Dr. Dennard Schaumann

## 2018-09-02 NOTE — Patient Instructions (Signed)
You have been scheduled for a colonoscopy. Please follow written instructions given to you at your visit today.  Please pick up your prep supplies at the pharmacy within the next 1-3 days. If you use inhalers (even only as needed), please bring them with you on the day of your procedure. Your physician has requested that you go to www.startemmi.com and enter the access code given to you at your visit today. This web site gives a general overview about your procedure. However, you should still follow specific instructions given to you by our office regarding your preparation for the procedure.  If you are age 78 or older, your body mass index should be between 23-30. Your There is no height or weight on file to calculate BMI. If this is out of the aforementioned range listed, please consider follow up with your Primary Care Provider.  If you are age 13 or younger, your body mass index should be between 19-25. Your There is no height or weight on file to calculate BMI. If this is out of the aformentioned range listed, please consider follow up with your Primary Care Provider.

## 2018-09-02 NOTE — Progress Notes (Signed)
   Subjective:    Patient ID: Eduardo Young, male    DOB: September 24, 1955, 63 y.o.   MRN: [id number]  HPI Eduardo Young is a 63 year old male with a past medical history of hemorrhagic stroke with right hemiparesis, sleep apnea, hypertension, hyperlipidemia, obesity, gout who is seen in consultation at the request of Dr. Dennard Schaumann to evaluate rectal bleeding.  He is here today with his sister.  He reports that he has developed rectal bleeding intermittently over the last 4 to 6 weeks.  Blood described as maroon at times.  Bowel movements have always been irregular maybe 2 times per week.  Stools at times can be hard and he can require straining for defecation.  He denies pain with passing stool.  Has seen some blood with wiping.  Denies abdominal pain.  Denies upper GI and hepatobiliary complaint.  No prior colonoscopy.  His sister may have had a colon polyp.  No family history of colon cancer.  He is not taking blood thinners.   Review of Systems As per HPI, otherwise negative  Current Medications, Allergies, Past Medical History, Past Surgical History, Family History and Social History were reviewed in [employer] record.     Objective:   Physical Exam BP (!) 112/58   Pulse 84  Constitutional: Well-developed and well-nourished. No distress. HEENT: Normocephalic and atraumatic.  No scleral icterus. Neck: Neck supple. Trachea midline. Cardiovascular: Normal rate, regular rhythm and intact distal pulses. No M/R/G Pulmonary/chest: Effort normal and breath sounds normal. No wheezing, rales or rhonchi. Abdominal: Soft, obese, nontender, nondistended. Bowel sounds active throughout.  Extremities: no clubbing, cyanosis, or edema Neurological: Alert and oriented to person place and time.  Right hemiparesis Skin: Skin is warm and dry. Psychiatric: Normal mood and affect. Behavior is normal.  CBC    Component Value Date/Time   WBC 11.2 (H) 08/26/2018 1507   RBC 4.35  08/26/2018 1507   HGB 12.5 (L) 08/26/2018 1507   HCT 36.9 (L) 08/26/2018 1507   PLT 228 08/26/2018 1507   MCV 84.8 08/26/2018 1507   MCH 28.7 08/26/2018 1507   MCHC 33.9 08/26/2018 1507   RDW 14.7 08/26/2018 1507   LYMPHSABS 1,646 08/26/2018 1507   MONOABS 425 09/28/2016 1054   EOSABS 694 (H) 08/26/2018 1507   BASOSABS 45 08/26/2018 1507       Assessment & Plan:  63 year old male with a past medical history of hemorrhagic stroke with right hemiparesis, sleep apnea, hypertension, hyperlipidemia, obesity, gout who is seen in consultation at the request of Dr. Dennard Schaumann to evaluate rectal bleeding.  1.  Rectal bleeding --I have recommended that we proceed to colonoscopy to evaluate his rectal bleeding.  This will also suffice for screening.  I recommended that we perform this in the outpatient hospital setting due to his limited mobility and prior stroke.  We spent considerable time today discussing the procedure, the sedation and the bowel preparation.  Prep will be more difficult for him given his limited mobility post stroke.  He is currently living in an apartment with his sister and they feel that they can use the bedside toilet to help with the bowel prep.  I will do a non-split Plenvu prep the night before procedure.  I explained to him the importance of having a good or excellent prep so that the views at colonoscopy are adequate.  We did discuss the risk, benefits and alternatives to colonoscopy and he is agreeable and wishes to proceed.  CC: Dr. Dennard Schaumann

## 2018-09-02 NOTE — Addendum Note (Signed)
Addended by: Jerene Bears on: 09/02/2018 06:32 PM   Modules accepted: Level of Service

## 2018-09-04 ENCOUNTER — Other Ambulatory Visit: Payer: Self-pay | Admitting: Family Medicine

## 2018-09-04 MED ORDER — DULAGLUTIDE 0.75 MG/0.5ML ~~LOC~~ SOAJ: 0.7500 mg | SUBCUTANEOUS | 0 refills | Status: DC

## 2018-09-04 MED ORDER — ACCU-CHEK FASTCLIX LANCET KIT: PACK | 1 refills | Status: AC

## 2018-09-04 MED ORDER — ACCU-CHEK AVIVA PLUS W/DEVICE KIT: PACK | 1 refills | Status: AC

## 2018-09-04 MED ORDER — GLUCOSE BLOOD VI STRP: ORAL_STRIP | 3 refills | Status: AC

## 2018-09-04 MED ORDER — ACCU-CHEK FASTCLIX LANCETS MISC: 3 refills | Status: AC

## 2018-09-06 ENCOUNTER — Ambulatory Visit (HOSPITAL_COMMUNITY): Payer: Medicare Other | Admitting: Anesthesiology

## 2018-09-06 ENCOUNTER — Encounter (HOSPITAL_COMMUNITY): Payer: Self-pay | Admitting: *Deleted

## 2018-09-06 ENCOUNTER — Encounter (HOSPITAL_COMMUNITY)
Admission: RE | Disposition: A | Discharge status: Home / Self Care | Payer: Self-pay | Source: Home / Self Care | Attending: Internal Medicine

## 2018-09-06 ENCOUNTER — Other Ambulatory Visit: Payer: Self-pay

## 2018-09-06 ENCOUNTER — Ambulatory Visit (HOSPITAL_COMMUNITY)
Admission: RE | Admit: 2018-09-06 | Discharge: 2018-09-06 | Disposition: A | Discharge status: Home / Self Care | Payer: Medicare Other | Attending: Internal Medicine | Admitting: Internal Medicine

## 2018-09-06 DIAGNOSIS — I10 Essential (primary) hypertension: Secondary | ICD-10-CM | POA: Insufficient documentation

## 2018-09-06 DIAGNOSIS — N289 Disorder of kidney and ureter, unspecified: Secondary | ICD-10-CM | POA: Diagnosis not present

## 2018-09-06 DIAGNOSIS — Z79899 Other long term (current) drug therapy: Secondary | ICD-10-CM | POA: Insufficient documentation

## 2018-09-06 DIAGNOSIS — G4730 Sleep apnea, unspecified: Secondary | ICD-10-CM | POA: Diagnosis not present

## 2018-09-06 DIAGNOSIS — K529 Noninfective gastroenteritis and colitis, unspecified: Secondary | ICD-10-CM | POA: Diagnosis not present

## 2018-09-06 DIAGNOSIS — K5730 Diverticulosis of large intestine without perforation or abscess without bleeding: Secondary | ICD-10-CM | POA: Insufficient documentation

## 2018-09-06 DIAGNOSIS — K625 Hemorrhage of anus and rectum: Secondary | ICD-10-CM

## 2018-09-06 DIAGNOSIS — K6389 Other specified diseases of intestine: Secondary | ICD-10-CM | POA: Insufficient documentation

## 2018-09-06 DIAGNOSIS — I69251 Hemiplegia and hemiparesis following other nontraumatic intracranial hemorrhage affecting right dominant side: Secondary | ICD-10-CM | POA: Diagnosis not present

## 2018-09-06 DIAGNOSIS — K5731 Diverticulosis of large intestine without perforation or abscess with bleeding: Secondary | ICD-10-CM | POA: Diagnosis not present

## 2018-09-06 DIAGNOSIS — Z87891 Personal history of nicotine dependence: Secondary | ICD-10-CM | POA: Insufficient documentation

## 2018-09-06 DIAGNOSIS — Z794 Long term (current) use of insulin: Secondary | ICD-10-CM | POA: Diagnosis not present

## 2018-09-06 DIAGNOSIS — K648 Other hemorrhoids: Secondary | ICD-10-CM | POA: Insufficient documentation

## 2018-09-06 DIAGNOSIS — M109 Gout, unspecified: Secondary | ICD-10-CM | POA: Insufficient documentation

## 2018-09-06 DIAGNOSIS — K50119 Crohn's disease of large intestine with unspecified complications: Secondary | ICD-10-CM

## 2018-09-06 DIAGNOSIS — E669 Obesity, unspecified: Secondary | ICD-10-CM | POA: Insufficient documentation

## 2018-09-06 DIAGNOSIS — Z6837 Body mass index (BMI) 37.0-37.9, adult: Secondary | ICD-10-CM | POA: Diagnosis not present

## 2018-09-06 DIAGNOSIS — E785 Hyperlipidemia, unspecified: Secondary | ICD-10-CM | POA: Insufficient documentation

## 2018-09-06 HISTORY — PX: COLONOSCOPY WITH PROPOFOL: SHX5780

## 2018-09-06 HISTORY — PX: BIOPSY: SHX5522

## 2018-09-06 LAB — GLUCOSE, CAPILLARY: Glucose-Capillary: 153 mg/dL — ABNORMAL HIGH (ref 70–99)

## 2018-09-06 SURGERY — COLONOSCOPY WITH PROPOFOL
Anesthesia: Monitor Anesthesia Care

## 2018-09-06 MED ORDER — LACTATED RINGERS IV SOLN
INTRAVENOUS | Status: DC
  Administered 2018-09-06: 1000 mL via INTRAVENOUS

## 2018-09-06 MED ORDER — FLEET ENEMA 7-19 GM/118ML RE ENEM
ENEMA | RECTAL | Status: AC
  Filled 2018-09-06: qty 1

## 2018-09-06 MED ORDER — PROPOFOL 500 MG/50ML IV EMUL
INTRAVENOUS | Status: DC | PRN
  Administered 2018-09-06: 125 ug/kg/min via INTRAVENOUS

## 2018-09-06 MED ORDER — SODIUM CHLORIDE 0.9 % IV SOLN: INTRAVENOUS | Status: DC

## 2018-09-06 MED ORDER — LIDOCAINE 2% (20 MG/ML) 5 ML SYRINGE
INTRAMUSCULAR | Status: DC | PRN
  Administered 2018-09-06: 100 mg via INTRAVENOUS

## 2018-09-06 MED ORDER — POLYETHYLENE GLYCOL 3350 17 GM/SCOOP PO POWD: 17.0000 g | Freq: Every day | ORAL | 11 refills | Status: AC

## 2018-09-06 MED ORDER — PROPOFOL 10 MG/ML IV BOLUS
INTRAVENOUS | Status: DC | PRN
  Administered 2018-09-06: 20 mg via INTRAVENOUS
  Administered 2018-09-06: 30 mg via INTRAVENOUS

## 2018-09-06 MED ORDER — PROPOFOL 10 MG/ML IV BOLUS
INTRAVENOUS | Status: AC
  Filled 2018-09-06: qty 40

## 2018-09-06 MED ORDER — FLEET ENEMA 7-19 GM/118ML RE ENEM
1.0000 | ENEMA | Freq: Once | RECTAL | Status: AC
  Administered 2018-09-06: 1 via RECTAL

## 2018-09-06 SURGICAL SUPPLY — 22 items

## 2018-09-06 NOTE — Anesthesia Preprocedure Evaluation (Addendum)
Anesthesia Evaluation  Patient identified by MRN, date of birth, ID band Patient awake    Reviewed: Allergy & Precautions, NPO status , Patient's Chart, lab work & pertinent test results, reviewed documented beta blocker date and time   Airway Mallampati: II  TM Distance: >3 FB Neck ROM: Full    Dental no notable dental hx. (+) Teeth Intact, Dental Advisory Given   Pulmonary sleep apnea , former smoker,    Pulmonary exam normal breath sounds clear to auscultation       Cardiovascular hypertension, Pt. on medications and Pt. on home beta blockers negative cardio ROS Normal cardiovascular exam Rhythm:Regular Rate:Normal  TTE 2008 EF 55-60%, no valve abnormalities   Neuro/Psych TIACVA (2008, right sided weakness), Residual Symptoms negative psych ROS   GI/Hepatic Neg liver ROS, GERD  Controlled,  Endo/Other  negative endocrine ROSdiabetes, Type 2, Insulin Dependent  Renal/GU Renal InsufficiencyRenal disease  negative genitourinary   Musculoskeletal negative musculoskeletal ROS (+)   Abdominal   Peds  Hematology negative hematology ROS (+)   Anesthesia Other Findings Colonoscopy for rectal bleeding, Hgb 12.5  Reproductive/Obstetrics                            Anesthesia Physical Anesthesia Plan  ASA: III  Anesthesia Plan: MAC   Post-op Pain Management:    Induction: Intravenous  PONV Risk Score and Plan: 1 and Propofol infusion and Treatment may vary due to age or medical condition  Airway Management Planned: Natural Airway  Additional Equipment:   Intra-op Plan:   Post-operative Plan:   Informed Consent: I have reviewed the patients History and Physical, chart, labs and discussed the procedure including the risks, benefits and alternatives for the proposed anesthesia with the patient or authorized representative who has indicated his/her understanding and acceptance.     Dental  advisory given  Plan Discussed with: CRNA  Anesthesia Plan Comments:         Anesthesia Quick Evaluation

## 2018-09-06 NOTE — Interval H&P Note (Signed)
History and Physical Interval Note: For colonoscopy today with MAC to eval rectal bleeding The nature of the procedure, as well as the risks, benefits, and alternatives were carefully and thoroughly reviewed with the patient. Ample time for discussion and questions allowed. The patient understood, was satisfied, and agreed to proceed.     09/06/2018 9:26 AM  [name]  has presented today for surgery, with the diagnosis of rectal bleeding  The various methods of treatment have been discussed with the patient and family. After consideration of risks, benefits and other options for treatment, the patient has consented to  Procedure(s): COLONOSCOPY WITH PROPOFOL (N/A) as a surgical intervention .  The patient's history has been reviewed, patient examined, no change in status, stable for surgery.  I have reviewed the patient's chart and labs.  Questions were answered to the patient's satisfaction.     Eduardo Young

## 2018-09-06 NOTE — Discharge Instructions (Signed)

## 2018-09-06 NOTE — Op Note (Signed)
[facility] Patient Name: Eduardo Young Procedure Date: 09/06/2018 MRN: [id number] Attending MD: Jerene Bears , MD Date of Birth: 1956-05-26 CSN: [id number] Age: 63 Admit Type: Outpatient Procedure:                Colonoscopy Indications:              Rectal bleeding Providers:                Lajuan Lines. Hilarie Fredrickson, MD, Cleda Daub, RN, Tinnie Gens,                            Technician, [facility], CRNA Referring MD:             Cammie Mcgee. [place] Medicines:                Monitored Anesthesia Care Complications:            No immediate complications. Estimated Blood Loss:     Estimated blood loss was minimal. Procedure:                Pre-Anesthesia Assessment:                           - Prior to the procedure, a History and Physical                            was performed, and patient medications and                            allergies were reviewed. The patient's tolerance of                            previous anesthesia was also reviewed. The risks                            and benefits of the procedure and the sedation                            options and risks were discussed with the patient.                            All questions were answered, and informed consent                            was obtained. Prior Anticoagulants: The patient has                            taken no previous anticoagulant or antiplatelet                            agents. ASA Grade Assessment: III - A patient with                            severe systemic disease. After reviewing the risks  and benefits, the patient was deemed in                            satisfactory condition to undergo the procedure.                           After obtaining informed consent, the colonoscope                            was passed under direct vision. Throughout the                            procedure, the patient's blood pressure, pulse, and                             oxygen saturations were monitored continuously. The                            CF-HQ190L ([id number]) Olympus colonoscope was                            introduced through the anus and advanced to the                            cecum, identified by appendiceal orifice and                            ileocecal valve. The colonoscopy was performed                            without difficulty. The patient tolerated the                            procedure well. The quality of the bowel                            preparation was fair with Plenvu despite copious                            irrigation and lavage. The ileocecal valve,                            appendiceal orifice, and rectum were photographed. Scope In: 9:57:26 AM Scope Out: 10:20:33 AM Scope Withdrawal Time: 0 hours 15 minutes 56 seconds  Total Procedure Duration: 0 hours 23 minutes 7 seconds  Findings:      The digital rectal exam was normal.      A segmental area of mildly to moderately erythematous mucosa was found       in the sigmoid colon (10-15 cm segment). Non-bleeding, but       circumferential. No erosions or ulcerations seen. This was biopsied with       a cold forceps for histology.      Scattered medium-mouthed diverticula were found in the sigmoid colon and       ascending colon.  Internal hemorrhoids were found during retroflexion. The hemorrhoids       were medium-sized.      The exam was otherwise without abnormality with limitations from bowel       preparation which could not be cleared completely despite copious       irrigation and lavage. No polyp were seen/found. Impression:               - Preparation of the colon was fair.                           - Erythematous mucosa in the sigmoid colon.                            Biopsied.                           - Diverticulosis in the sigmoid colon and in the                            ascending colon.                           - Internal  hemorrhoids. Moderate Sedation:      N/A Recommendation:           - Patient has a contact number available for                            emergencies. The signs and symptoms of potential                            delayed complications were discussed with the                            patient. Return to normal activities tomorrow.                            Written discharge instructions were provided to the                            patient.                           - Resume previous diet.                           - Continue present medications.                           - Add MiraLax 17 g daily to soften stool and                            promote more regularity and hopefully improve                            potential hemorrhoidal bleeding.                           -  Await pathology results.                           - Repeat colonoscopy at appointment to be scheduled                            for screening purposes is recommended with 2 day                            preparation given fair preparation today. Procedure Code(s):        --- Professional ---                           [phone], Colonoscopy, flexible; with biopsy, single                            or multiple Diagnosis Code(s):        --- Professional ---                           K64.8, Other hemorrhoids                           K63.89, Other specified diseases of intestine                           K62.5, Hemorrhage of anus and rectum                           K57.30, Diverticulosis of large intestine without                            perforation or abscess without bleeding CPT copyright 2018 American Medical Association. All rights reserved. The codes documented in this report are preliminary and upon coder review may  be revised to meet current compliance requirements. Jerene Bears, MD 09/06/2018 10:35:53 AM This report has been signed electronically. Number of Addenda: 0

## 2018-09-06 NOTE — Transfer of Care (Signed)
Immediate Anesthesia Transfer of Care Note  Patient: Eduardo Young  Procedure(s) Performed: COLONOSCOPY WITH PROPOFOL (N/A ) BIOPSY  Patient Location: PACU  Anesthesia Type:MAC  Level of Consciousness: awake, alert  and oriented  Airway & Oxygen Therapy: Patient Spontanous Breathing and Patient connected to face mask oxygen  Post-op Assessment: Report given to RN and Post -op Vital signs reviewed and stable  Post vital signs: Reviewed and stable  Last Vitals:  Vitals Value Taken Time  BP    Temp    Pulse 63 09/06/2018 10:32 AM  Resp 24 09/06/2018 10:32 AM  SpO2 100 % 09/06/2018 10:32 AM  Vitals shown include unvalidated device data.  Last Pain:  Vitals:   09/06/18 0845  TempSrc: Oral  PainSc: 0-No pain         Complications: No apparent anesthesia complications

## 2018-09-08 NOTE — Anesthesia Postprocedure Evaluation (Signed)
Anesthesia Post Note  Patient: Eduardo Young  Procedure(s) Performed: COLONOSCOPY WITH PROPOFOL (N/A ) BIOPSY     Patient location during evaluation: Endoscopy Anesthesia Type: MAC Level of consciousness: awake and alert Pain management: pain level controlled Vital Signs Assessment: post-procedure vital signs reviewed and stable Respiratory status: spontaneous breathing, nonlabored ventilation, respiratory function stable and patient connected to nasal cannula oxygen Cardiovascular status: blood pressure returned to baseline and stable Postop Assessment: no apparent nausea or vomiting Anesthetic complications: no    Last Vitals:  Vitals:   09/06/18 1040 09/06/18 1050  BP: (!) 109/55 117/62  Pulse: 63 (!) 59  Resp: (!) 26 20  Temp:    SpO2: 95% 95%    Last Pain:  Vitals:   09/06/18 1050  TempSrc:   PainSc: 0-No pain                 Dawson Hollman L Eryc Bodey

## 2018-09-09 ENCOUNTER — Encounter (HOSPITAL_COMMUNITY): Payer: Self-pay | Admitting: Internal Medicine

## 2018-09-10 ENCOUNTER — Ambulatory Visit (INDEPENDENT_AMBULATORY_CARE_PROVIDER_SITE_OTHER): Payer: Medicare Other | Admitting: Family Medicine

## 2018-09-10 ENCOUNTER — Encounter: Payer: Self-pay | Admitting: Internal Medicine

## 2018-09-10 VITALS — Temp 97.4°F

## 2018-09-10 DIAGNOSIS — D485 Neoplasm of uncertain behavior of skin: Secondary | ICD-10-CM | POA: Diagnosis not present

## 2018-09-10 DIAGNOSIS — L989 Disorder of the skin and subcutaneous tissue, unspecified: Secondary | ICD-10-CM

## 2018-09-10 DIAGNOSIS — E119 Type 2 diabetes mellitus without complications: Secondary | ICD-10-CM

## 2018-09-10 DIAGNOSIS — Z794 Long term (current) use of insulin: Secondary | ICD-10-CM | POA: Diagnosis not present

## 2018-09-10 NOTE — Progress Notes (Signed)
Subjective:    Patient ID: Eduardo Young, male    DOB: 1955/11/10, 63 y.o.   MRN: [id number]  HPI  08/26/18 Patient has not been seen in more than a year.  He has never had a colonoscopy.  Patient has a history of insulin-dependent diabetes mellitus.  He is overdue for an A1c.  He states for the last 4 to 5 weeks, he has been seeing frank blood in the stool.  At times it is mixed in with the stool.  At other times it smeared on the outside but it is always bright red blood.  He denies any pain with defecation or abdominal pain.  He denies any dizziness, lightheadedness, or fatigue.  He denies any chest pain or shortness of breath.  He denies any syncope.  He also has 2 moles on his body that have been concerned.  One is a 6 mm mole on his left shoulder adjacent to a tattoo that is changing and growing in size.  The second is a 5 to 6 mm mole directly below his right nipple on his upper abdomen that is also changing and growing in size according to the patient.  He is requesting a biopsy of both of these lesions today.  At that time, my plan was: I anesthetized the lesion on his left shoulder was 0.1% lidocaine with epinephrine.  Shave biopsy was performed.  Hemostasis was achieved with Drysol and a Band-Aid and lesion was sent to pathology in a labeled container.  I then anesthetized the lesion directly below his right nipple on his upper abdomen with 0.1% lidocaine with epinephrine.  Using sterile technique a shave biopsy was performed and the lesion was sent to pathology labeled container.  Hemostasis was achieved with Drysol and a Band-Aid.  Regarding this blood in the stool, rectal exam was performed today which is normal.  There are no palpable lesions.  However the patient needs a colonoscopy as soon as possible.  I will also check a CBC to evaluate for any signs of significant anemia.  While checking lab work I will also obtain a CMP and an A1c to monitor the management of his diabetes.  Follow-up  with GI as soon as possible.  09/10/18 Biopsy of the left shoulder revealed atypical melanocytic cells: B Source   Comment: Skin Biopsy, left shoulder  B Gross Description   Comment: Received in a single container of formalin  labeled with two patient identifiers, patient's  name and DOB is a 0.8 x 0.8 x 0.1 cm skin shave  displaying a tan scaly epidermal surface with  centrally located flat brown scaly hyperpigmented  lesion measuring 0.6 x 0.6 cm and is 0.1 cm from  the nearest margin. The surgical margin is inked  orange. The shave is trisected and entirely  submitted in cassette B.    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  Microscopic description: There are discrete  nests of uniform melanocytes at the  dermal-epidermal junction and within the dermis.   Re-excision of the lesion to ensure complete  removal would be judicious.     Patient is here today for wider excision.  We also discussed his hemoglobin A1c of 10.9.  Patient states that he is taking his insulin however he is not checking his sugars at all.  He also admits to eating [employer] doughnuts frequently.  I explained to the patient that is going  to be difficult to control his blood sugars if he is not checking them.  I explained that it is dangerous to rapidly increase insulin without any idea of how his blood sugars are responding.  Therefore we have recommended starting the patient on Trulicity to try to help address his postprandial sugars in a safe fashion without increasing his risk for hypoglycemia significantly.  Patient has yet to start the medication.  Past Medical History:  Diagnosis Date  . Chronic venous insufficiency   . Diabetes mellitus   . GERD (gastroesophageal reflux disease)   . Gout   . Hemiparesis ([place])   . Hyperlipidemia   . Hypertension   . Obesity   . Renal calculi   . Sleep apnea, obstructive   . Stroke [facility])    [id number]     Past Surgical History:  Procedure Laterality Date  . BIOPSY  09/06/2018   Procedure: BIOPSY;  Surgeon: Jerene Bears, MD;  Location: [facility];  Service: Gastroenterology;;  . COLONOSCOPY WITH PROPOFOL N/A 09/06/2018   Procedure: COLONOSCOPY WITH PROPOFOL;  Surgeon: Jerene Bears, MD;  Location: [facility];  Service: Gastroenterology;  Laterality: N/A;  . LITHOTRIPSY    . NASAL POLYP EXCISION     Current Outpatient Medications on File Prior to Visit  Medication Sig Dispense Refill  . ACCU-CHEK FASTCLIX LANCETS MISC Check BS BID DX - E11.9 200 each 3  . allopurinol (ZYLOPRIM) 100 MG tablet Take 1 tablet (100 mg total) by mouth 2 (two) times daily. 180 tablet 3  . amLODipine (NORVASC) 10 MG tablet TAKE 1 TABLET BY MOUTH ONCE DAILY (Patient taking differently: Take 10 mg by mouth daily. ) 90 tablet 3  . aspirin 81 MG tablet Take 81 mg by mouth every evening.     Marland Kitchen atorvastatin (LIPITOR) 40 MG tablet TAKE 1 TABLET BY MOUTH ONCE DAILY *STOP  SIMVASTATIN (Patient taking differently: Take 40 mg by mouth daily. ) 90 tablet 0  . Blood Glucose Monitoring Suppl (ACCU-CHEK AVIVA PLUS) w/Device KIT Check BS BID DX - E11.9 1 kit 1  . cloNIDine (CATAPRES) 0.2 MG tablet TAKE 1 TABLET BY MOUTH TWICE DAILY (Patient taking differently: Take 0.2 mg by mouth 2 (two) times daily. ) 180 tablet 0  . doxazosin (CARDURA) 4 MG tablet Take 1 tablet (4 mg total) by mouth daily. 90 tablet 3  . Dulaglutide (TRULICITY) 2.42 AS/3.4HD SOPN Inject 0.75 mg into the skin once a week. 4 pen 0  . glucose blood (ACCU-CHEK AVIVA) test strip Check BS BID DX - E11.9 150 each 3  . hydrALAZINE (APRESOLINE) 25 MG tablet Take 2 tablets (50 mg total) by mouth every 8 (eight) hours. 180 tablet 11  . hydrochlorothiazide (HYDRODIURIL) 25 MG tablet TAKE 1 TABLET BY MOUTH ONCE DAILY **NEEDS  OFFICE  VISIT  AND  LABS  BEFORE  FURTHER  REFILLS** (Patient taking differently: Take 25 mg by mouth daily. ) 30 tablet 0  . Insulin Glargine  (LANTUS SOLOSTAR) 100 UNIT/ML Solostar Pen Inject 45 Units into the skin daily. 5 pen 11  . labetalol (NORMODYNE) 300 MG tablet Take 1 tablet (300 mg total) by mouth 2 (two) times daily. 180 tablet 3  . Lancets Misc. (ACCU-CHEK FASTCLIX LANCET) KIT Check BS BID DX - E11.9 1 kit 1  . lisinopril (PRINIVIL,ZESTRIL) 20 MG tablet Take 1 tablet (20 mg total) by mouth 2 (two) times daily. 180 tablet 3  . Multiple Vitamin (MULTIVITAMIN WITH MINERALS) TABS tablet Take 1 tablet by  mouth daily.    . polyethylene glycol powder (GLYCOLAX/MIRALAX) powder Take 17 g by mouth daily. 510 g 11  . TRADJENTA 5 MG TABS tablet TAKE 1 TABLET BY MOUTH ONCE DAILY (Patient taking differently: Take 5 mg by mouth daily. ) 90 tablet 0   No current facility-administered medications on file prior to visit.    No Known Allergies Social History   Socioeconomic History  . Marital status: Single    Spouse name: Not on file  . Number of children: Not on file  . Years of education: Not on file  . Highest education level: Not on file  Occupational History  . Not on file  Social Needs  . Financial resource strain: Not on file  . Food insecurity:    Worry: Not on file    Inability: Not on file  . Transportation needs:    Medical: Not on file    Non-medical: Not on file  Tobacco Use  . Smoking status: Former Research scientist (life sciences)  . Smokeless tobacco: Never Used  Substance and Sexual Activity  . Alcohol use: Yes    Comment: Occasional  . Drug use: Yes    Types: Cocaine, Marijuana    Comment: Past Hx  . Sexual activity: Not on file  Lifestyle  . Physical activity:    Days per week: Not on file    Minutes per session: Not on file  . Stress: Not on file  Relationships  . Social connections:    Talks on phone: Not on file    Gets together: Not on file    Attends religious service: Not on file    Active member of club or organization: Not on file    Attends meetings of clubs or organizations: Not on file    Relationship  status: Not on file  . Intimate partner violence:    Fear of current or ex partner: Not on file    Emotionally abused: Not on file    Physically abused: Not on file    Forced sexual activity: Not on file  Other Topics Concern  . Not on file  Social History Narrative  . Not on file      Review of Systems  All other systems reviewed and are negative.      Objective:   Physical Exam  Constitutional: He appears well-developed and well-nourished. No distress.  Cardiovascular: Normal rate, regular rhythm and normal heart sounds.  No murmur heard. Pulmonary/Chest: Effort normal and breath sounds normal. No respiratory distress. He has no wheezes. He has no rales. He exhibits no tenderness.  Genitourinary: Rectum:     No rectal mass, anal fissure, tenderness, external hemorrhoid, internal hemorrhoid or abnormal anal tone.   Skin: He is not diaphoretic.     Vitals reviewed.     Atypical melanocytic cells on previous skin biopsy  Area diagrammed in his history of present illness and physical exam was anesthetized with 0.1% lidocaine with epinephrine.  An elliptical excision was performed around the lesion with 5 mm margins in all directions.  This was excised down to the underlying dermis using sterile technique after the patient had been prepped and draped in sterile fashion.  The lesion was sent to pathology in a labeled container.  Skin edges were approximated with 4 simple interrupted 3-0 Ethilon sutures.  Await the results of the biopsy.  Return in 1 week for suture removal.  At that time I would like him to bring in fasting blood sugars and 2-hour postprandial  sugars so that we can uptitrate his insulin accordingly.

## 2018-09-11 LAB — TISSUE SPECIMEN

## 2018-09-11 LAB — PATHOLOGY

## 2018-09-17 ENCOUNTER — Ambulatory Visit (INDEPENDENT_AMBULATORY_CARE_PROVIDER_SITE_OTHER): Payer: Medicare Other | Admitting: Family Medicine

## 2018-09-17 ENCOUNTER — Encounter: Payer: Self-pay | Admitting: Family Medicine

## 2018-09-17 VITALS — BP 130/60 | HR 68 | Temp 98.0°F | Resp 18

## 2018-09-17 DIAGNOSIS — Z794 Long term (current) use of insulin: Secondary | ICD-10-CM

## 2018-09-17 DIAGNOSIS — Z23 Encounter for immunization: Secondary | ICD-10-CM

## 2018-09-17 DIAGNOSIS — E119 Type 2 diabetes mellitus without complications: Secondary | ICD-10-CM

## 2018-09-17 MED ORDER — LINACLOTIDE 145 MCG PO CAPS: 145.0000 ug | ORAL_CAPSULE | Freq: Every day | ORAL | 3 refills | Status: DC

## 2018-09-17 MED ORDER — BISACODYL 5 MG PO TBEC: 5.0000 mg | DELAYED_RELEASE_TABLET | Freq: Every day | ORAL | 0 refills | Status: AC | PRN

## 2018-09-17 NOTE — Progress Notes (Signed)
Subjective:    Patient ID: Eduardo Young, male    DOB: [birth year], 63 y.o.   MRN: [id number]  HPI  08/26/18 Patient has not been seen in more than a year.  He has never had a colonoscopy.  Patient has a history of insulin-dependent diabetes mellitus.  He is overdue for an A1c.  He states for the last 4 to 5 weeks, he has been seeing frank blood in the stool.  At times it is mixed in with the stool.  At other times it smeared on the outside but it is always bright red blood.  He denies any pain with defecation or abdominal pain.  He denies any dizziness, lightheadedness, or fatigue.  He denies any chest pain or shortness of breath.  He denies any syncope.  He also has 2 moles on his body that have been concerned.  One is a 6 mm mole on his left shoulder adjacent to a tattoo that is changing and growing in size.  The second is a 5 to 6 mm mole directly below his right nipple on his upper abdomen that is also changing and growing in size according to the patient.  He is requesting a biopsy of both of these lesions today.  At that time, my plan was: I anesthetized the lesion on his left shoulder was 0.1% lidocaine with epinephrine.  Shave biopsy was performed.  Hemostasis was achieved with Drysol and a Band-Aid and lesion was sent to pathology in a labeled container.  I then anesthetized the lesion directly below his right nipple on his upper abdomen with 0.1% lidocaine with epinephrine.  Using sterile technique a shave biopsy was performed and the lesion was sent to pathology labeled container.  Hemostasis was achieved with Drysol and a Band-Aid.  Regarding this blood in the stool, rectal exam was performed today which is normal.  There are no palpable lesions.  However the patient needs a colonoscopy as soon as possible.  I will also check a CBC to evaluate for any signs of significant anemia.  While checking lab work I will also obtain a CMP and an A1c to monitor the management of his diabetes.  Follow-up  with GI as soon as possible.  09/10/18 Biopsy of the left shoulder revealed atypical melanocytic cells: B Source   Comment: Skin Biopsy, left shoulder  B Gross Description   Comment: Received in a single container of formalin  labeled with two patient identifiers, patient's  name and DOB is a 0.8 x 0.8 x 0.1 cm skin shave  displaying a tan scaly epidermal surface with  centrally located flat brown scaly hyperpigmented  lesion measuring 0.6 x 0.6 cm and is 0.1 cm from  the nearest margin. The surgical margin is inked  orange. The shave is trisected and entirely  submitted in cassette B.    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  Microscopic description: There are discrete  nests of uniform melanocytes at the  dermal-epidermal junction and within the dermis.   Re-excision of the lesion to ensure complete  removal would be judicious.     Patient is here today for wider excision.  We also discussed his hemoglobin A1c of 10.9.  Patient states that he is taking his insulin however he is not checking his sugars at all.  He also admits to eating [employer] doughnuts frequently.  I explained to the patient that is going  to be difficult to control his blood sugars if he is not checking them.  I explained that it is dangerous to rapidly increase insulin without any idea of how his blood sugars are responding.  Therefore we have recommended starting the patient on Trulicity to try to help address his postprandial sugars in a safe fashion without increasing his risk for hypoglycemia significantly.  Patient has yet to start the medication.  At that time, my plan was:  Atypical melanocytic cells on previous skin biopsy  Area diagrammed in his history of present illness and physical exam was anesthetized with 0.1% lidocaine with epinephrine.  An elliptical excision was performed around the lesion with 5 mm margins in all directions.  This  was excised down to the underlying dermis using sterile technique after the patient had been prepped and draped in sterile fashion.  The lesion was sent to pathology in a labeled container.  Skin edges were approximated with 4 simple interrupted 3-0 Ethilon sutures.  Await the results of the biopsy.  Return in 1 week for suture removal.  At that time I would like him to bring in fasting blood sugars and 2-hour postprandial sugars so that we can uptitrate his insulin accordingly.  09/17/18 Biopsy margins were confirmed to be clear of any residual atypical melanocytic cells.  He is here today for suture removal.  4 sutures were removed without difficulty.  There is no evidence of cellulitis.  However he has not had a bowel movement in more than a week despite taking MiraLAX.  He is also not had a pneumonia vaccine or flu shot.  He states that his fasting blood sugars are between 150 and 180.  2-hour postprandial sugars are usually 200.  He is currently on 45 units of Lantus and he just began Trulicity this week  Past Medical History:  Diagnosis Date  . Chronic venous insufficiency   . Diabetes mellitus   . GERD (gastroesophageal reflux disease)   . Gout   . Hemiparesis ([place])   . Hyperlipidemia   . Hypertension   . Obesity   . Renal calculi   . Sleep apnea, obstructive   . Stroke [facility])    [id number]   Past Surgical History:  Procedure Laterality Date  . BIOPSY  09/06/2018   Procedure: BIOPSY;  Surgeon: Jerene Bears, MD;  Location: [facility];  Service: Gastroenterology;;  . COLONOSCOPY WITH PROPOFOL N/A 09/06/2018   Procedure: COLONOSCOPY WITH PROPOFOL;  Surgeon: Jerene Bears, MD;  Location: [facility];  Service: Gastroenterology;  Laterality: N/A;  . LITHOTRIPSY    . NASAL POLYP EXCISION     Current Outpatient Medications on File Prior to Visit  Medication Sig Dispense Refill  . ACCU-CHEK FASTCLIX LANCETS MISC Check BS BID DX - E11.9 200 each 3  . allopurinol (ZYLOPRIM) 100 MG tablet  Take 1 tablet (100 mg total) by mouth 2 (two) times daily. 180 tablet 3  . amLODipine (NORVASC) 10 MG tablet TAKE 1 TABLET BY MOUTH ONCE DAILY 90 tablet 3  . aspirin 81 MG tablet Take 81 mg by mouth every evening.     Marland Kitchen atorvastatin (LIPITOR) 40 MG tablet TAKE 1 TABLET BY MOUTH ONCE DAILY *STOP  SIMVASTATIN 90 tablet 0  . Blood Glucose Monitoring Suppl (ACCU-CHEK AVIVA PLUS) w/Device KIT Check BS BID DX - E11.9 1 kit 1  . cloNIDine (CATAPRES) 0.2 MG tablet TAKE 1 TABLET BY MOUTH TWICE DAILY 180 tablet 0  . doxazosin (CARDURA) 4 MG tablet Take  1 tablet (4 mg total) by mouth daily. 90 tablet 3  . Dulaglutide (TRULICITY) 4.40 HK/7.4QV SOPN Inject 0.75 mg into the skin once a week. 4 pen 0  . glucose blood (ACCU-CHEK AVIVA) test strip Check BS BID DX - E11.9 150 each 3  . hydrALAZINE (APRESOLINE) 25 MG tablet Take 2 tablets (50 mg total) by mouth every 8 (eight) hours. 180 tablet 11  . hydrochlorothiazide (HYDRODIURIL) 25 MG tablet TAKE 1 TABLET BY MOUTH ONCE DAILY **NEEDS  OFFICE  VISIT  AND  LABS  BEFORE  FURTHER  REFILLS** 30 tablet 0  . Insulin Glargine (LANTUS SOLOSTAR) 100 UNIT/ML Solostar Pen Inject 45 Units into the skin daily. 5 pen 11  . labetalol (NORMODYNE) 300 MG tablet Take 1 tablet (300 mg total) by mouth 2 (two) times daily. 180 tablet 3  . Lancets Misc. (ACCU-CHEK FASTCLIX LANCET) KIT Check BS BID DX - E11.9 1 kit 1  . lisinopril (PRINIVIL,ZESTRIL) 20 MG tablet Take 1 tablet (20 mg total) by mouth 2 (two) times daily. 180 tablet 3  . Multiple Vitamin (MULTIVITAMIN WITH MINERALS) TABS tablet Take 1 tablet by mouth daily.    . polyethylene glycol powder (GLYCOLAX/MIRALAX) powder Take 17 g by mouth daily. 510 g 11  . TRADJENTA 5 MG TABS tablet TAKE 1 TABLET BY MOUTH ONCE DAILY 90 tablet 0   No current facility-administered medications on file prior to visit.    No Known Allergies Social History   Socioeconomic History  . Marital status: Single    Spouse name: Not on file  .  Number of children: Not on file  . Years of education: Not on file  . Highest education level: Not on file  Occupational History  . Not on file  Social Needs  . Financial resource strain: Not on file  . Food insecurity:    Worry: Not on file    Inability: Not on file  . Transportation needs:    Medical: Not on file    Non-medical: Not on file  Tobacco Use  . Smoking status: Former Research scientist (life sciences)  . Smokeless tobacco: Never Used  Substance and Sexual Activity  . Alcohol use: Yes    Comment: Occasional  . Drug use: Yes    Types: Cocaine, Marijuana    Comment: Past Hx  . Sexual activity: Not on file  Lifestyle  . Physical activity:    Days per week: Not on file    Minutes per session: Not on file  . Stress: Not on file  Relationships  . Social connections:    Talks on phone: Not on file    Gets together: Not on file    Attends religious service: Not on file    Active member of club or organization: Not on file    Attends meetings of clubs or organizations: Not on file    Relationship status: Not on file  . Intimate partner violence:    Fear of current or ex partner: Not on file    Emotionally abused: Not on file    Physically abused: Not on file    Forced sexual activity: Not on file  Other Topics Concern  . Not on file  Social History Narrative  . Not on file      Review of Systems  All other systems reviewed and are negative.      Objective:   Physical Exam  Constitutional: He appears well-developed and well-nourished. No distress.  Cardiovascular: Normal rate, regular rhythm and normal heart  sounds.  No murmur heard. Pulmonary/Chest: Effort normal and breath sounds normal. No respiratory distress. He has no wheezes. He has no rales. He exhibits no tenderness.  Skin: He is not diaphoretic.  Vitals reviewed.      Suture removal, 4 sutures removed without difficulty.  No further follow-up is necessary for this.  Regarding his diabetes I recommended gradually  increasing his Lantus from 45 units to 60 units and 5 unit increments.  He will increase by 5 units and then check his fasting blood sugar.  Fasting blood sugars greater than 130 he will continue to increase up until he is on 60 units of Lantus once daily in addition to his Trulicity.  We will add Linzess 145 mcg p.o. daily for chronic constipation.  If after 2 or 3 days if the patient has not had a bowel movement I will suggest using a stimulant laxative such as Dulcolax.

## 2018-09-18 NOTE — Addendum Note (Signed)
Addended by: Shary Decamp B on: 09/18/2018 10:17 AM   Modules accepted: Orders

## 2018-09-23 DIAGNOSIS — T148XXA Other injury of unspecified body region, initial encounter: Secondary | ICD-10-CM | POA: Diagnosis not present

## 2018-09-23 DIAGNOSIS — M205X1 Other deformities of toe(s) (acquired), right foot: Secondary | ICD-10-CM | POA: Diagnosis not present

## 2018-09-23 DIAGNOSIS — E1351 Other specified diabetes mellitus with diabetic peripheral angiopathy without gangrene: Secondary | ICD-10-CM | POA: Diagnosis not present

## 2018-09-23 DIAGNOSIS — L602 Onychogryphosis: Secondary | ICD-10-CM | POA: Diagnosis not present

## 2018-10-02 IMAGING — CT CT HEAD W/O CM
4 series · 16 of 47 positions shown, 18 images · non-contrast
Comparison: 11/13/2006 CT

CLINICAL DATA: Right leg weakness.  History of stroke.

EXAM:
CT HEAD WITHOUT CONTRAST
TECHNIQUE: Contiguous axial images were obtained from the base of the skull
through the vertex without intravenous contrast.

[Series 2: head without · axial · non-contrast · 0.49mm/px · z∈[-167,-27]mm · 7 of 38 slices shown, 9 images]
[im 5/38  brain]
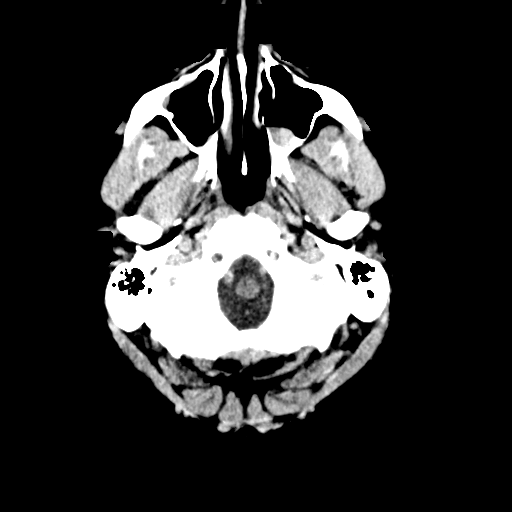
[im 5/38  bone]
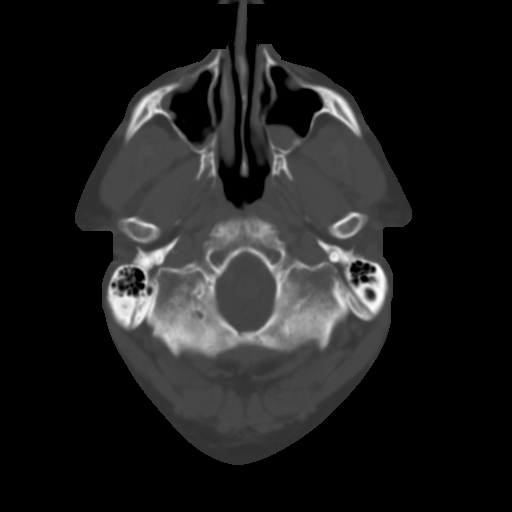
[im 10/38  brain]
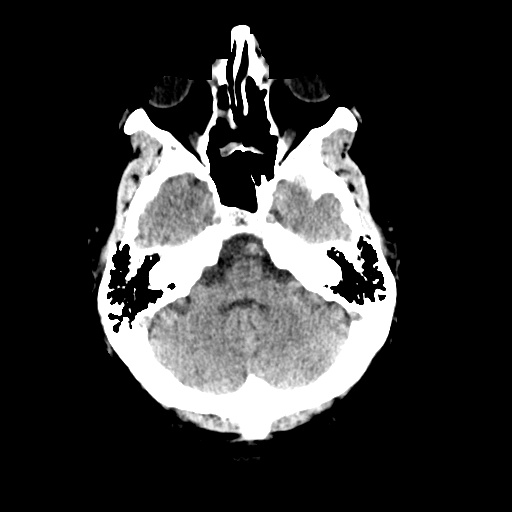
[im 14/38  brain]
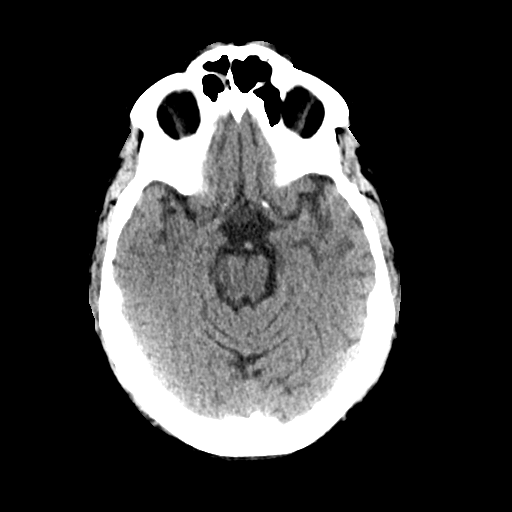
[im 19/38  brain]
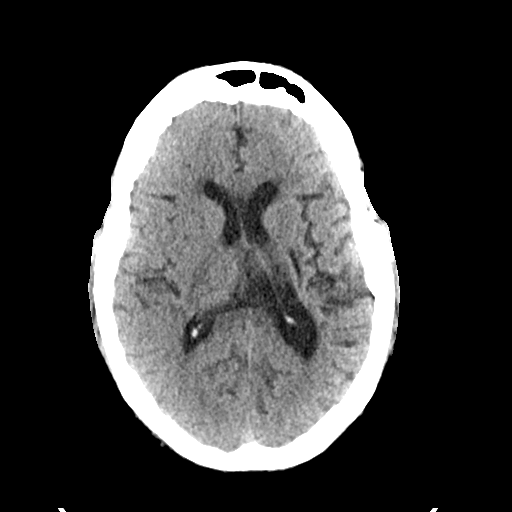
[im 24/38  brain]
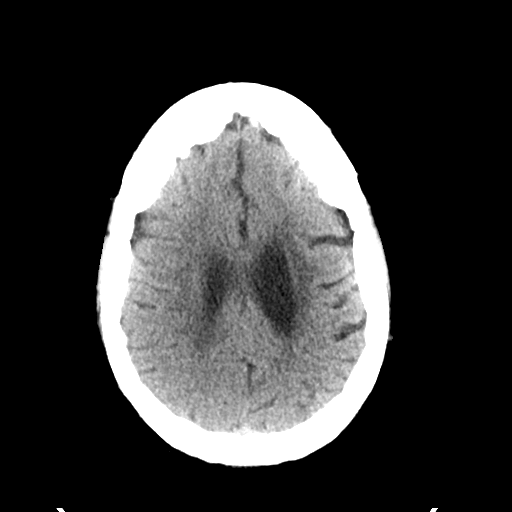
[im 24/38  bone]
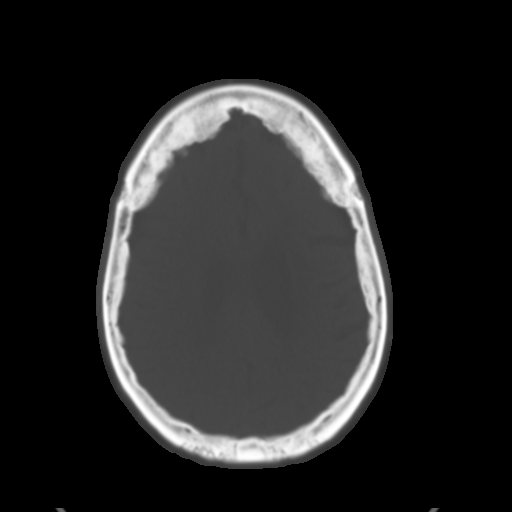
[im 28/38  brain]
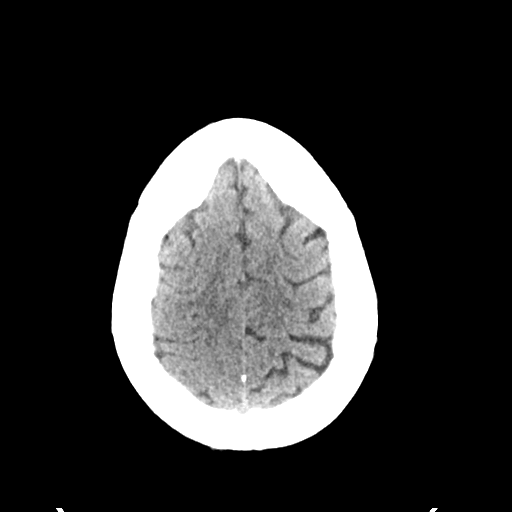
[im 33/38  brain]
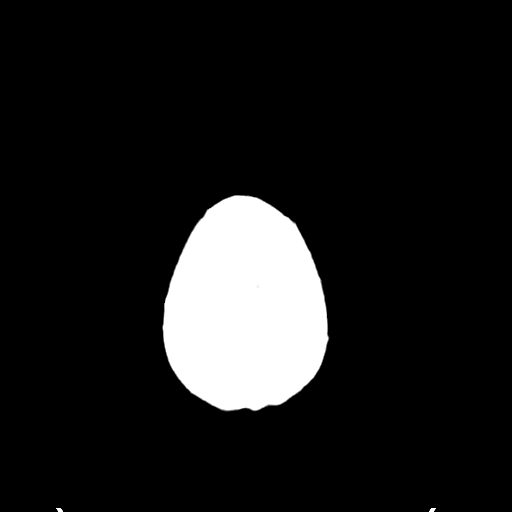

[Series 3: head bone · axial · 0.49mm/px · z∈[-169,-131]mm · 3 of 95 slices shown]
[im 10/95  bone]
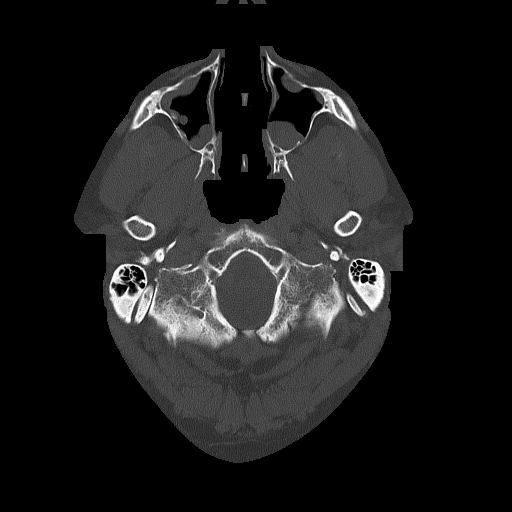
[im 19/95  bone]
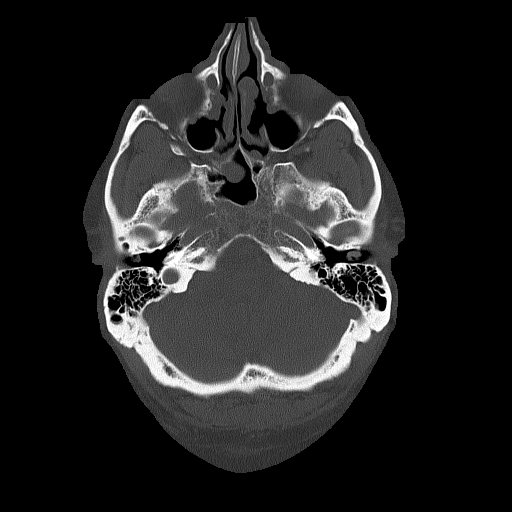
[im 29/95  bone]
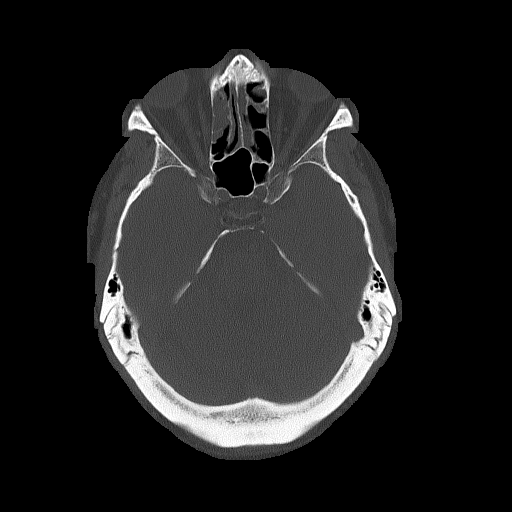

[Series 4: head without cor · coronal · non-contrast · 0.37mm/px · 3 of 80 slices shown]
[im 27/80  brain]
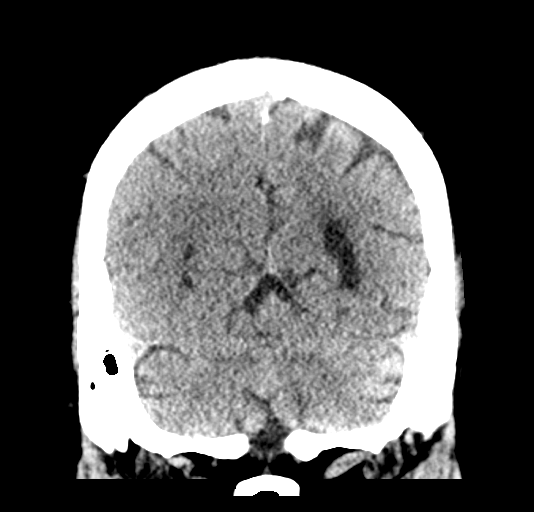
[im 36/80  brain]
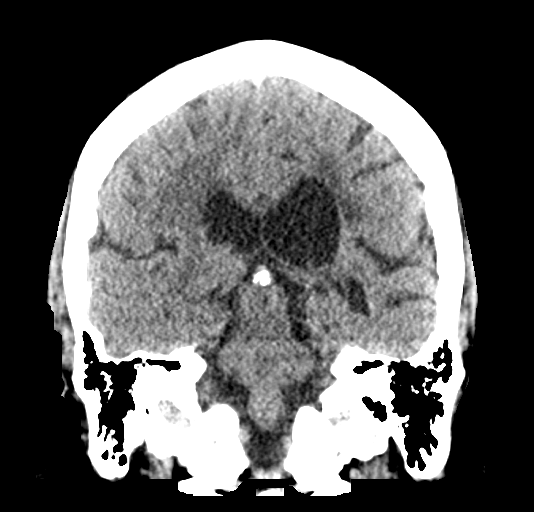
[im 44/80  brain]
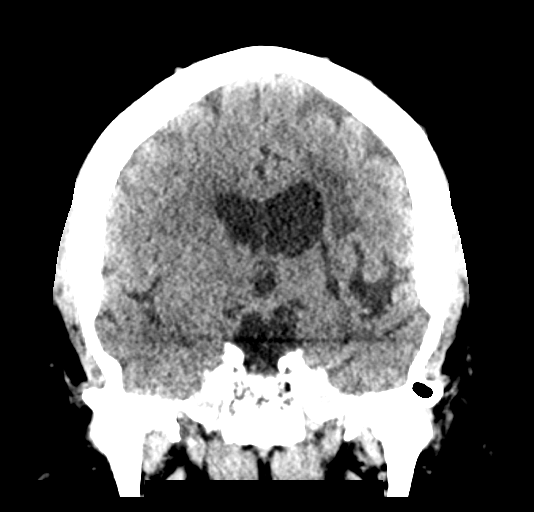

[Series 5: head without sag · sagittal · non-contrast · 0.37mm/px · 3 of 67 slices shown]
[im 23/67  brain]
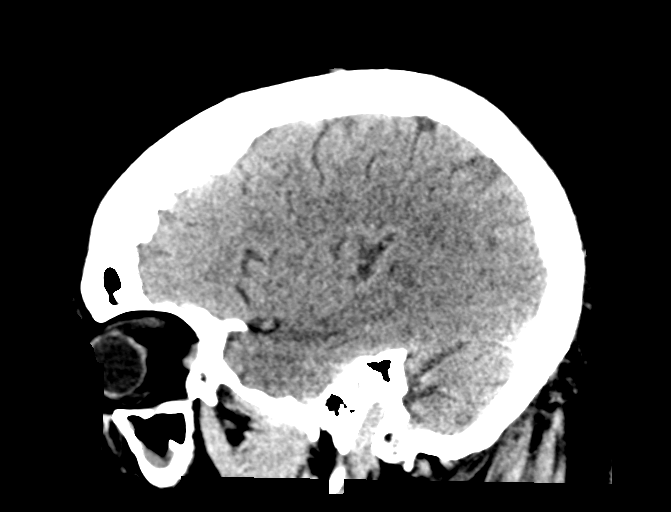
[im 34/67  brain]
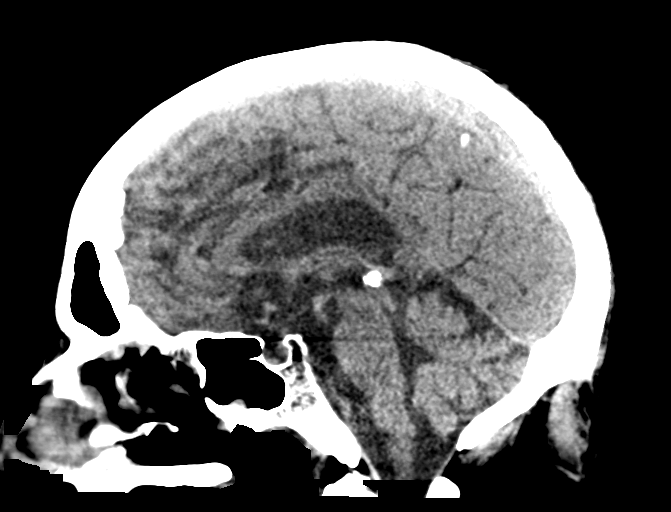
[im 45/67  brain]
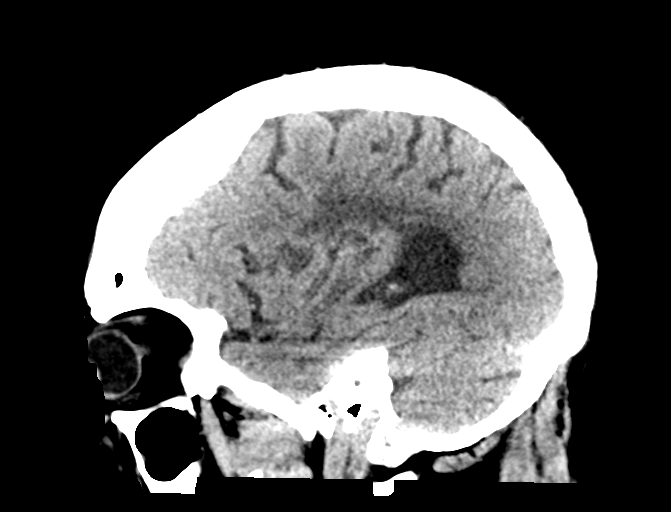

[16 of 47 positions shown; findings below may reference images not displayed]

FINDINGS: Brain: Encephalomalacia of the left basal ganglia from chronic left
basal ganglial hematoma. Slight ex vacuo dilatation of body of the
left lateral ventricle. Chronic small vessel ischemic disease of
periventricular white matter. No acute intracranial hemorrhage,
midline shift or edema. No extra-axial fluid.

Vascular: No hyperdense vessels or unexpected calcifications.

Skull: Intact.

Sinuses/Orbits: Clear mastoids. Moderate ethmoid and frontal sinus
mucosal thickening. Mild bilateral maxillary and ethmoid sinus
mucosal thickening.

Other: None
IMPRESSION: 1. Chronic small vessel ischemic disease of periventricular white
matter.
2. Encephalomalacia from chronic left basal ganglial hematoma.
3. No acute intracranial abnormality.
4. Chronic paranasal sinusitis.

## 2018-10-03 ENCOUNTER — Other Ambulatory Visit: Payer: Self-pay | Admitting: Family Medicine

## 2018-10-03 DIAGNOSIS — I10 Essential (primary) hypertension: Secondary | ICD-10-CM

## 2018-10-03 IMAGING — CR DG CHEST 2V
2 series · 2 of 2 positions shown · non-contrast
Comparison: 03/24/2013

CLINICAL DATA: Left-sided weakness

EXAM:
CHEST  2 VIEW

[chest ap]
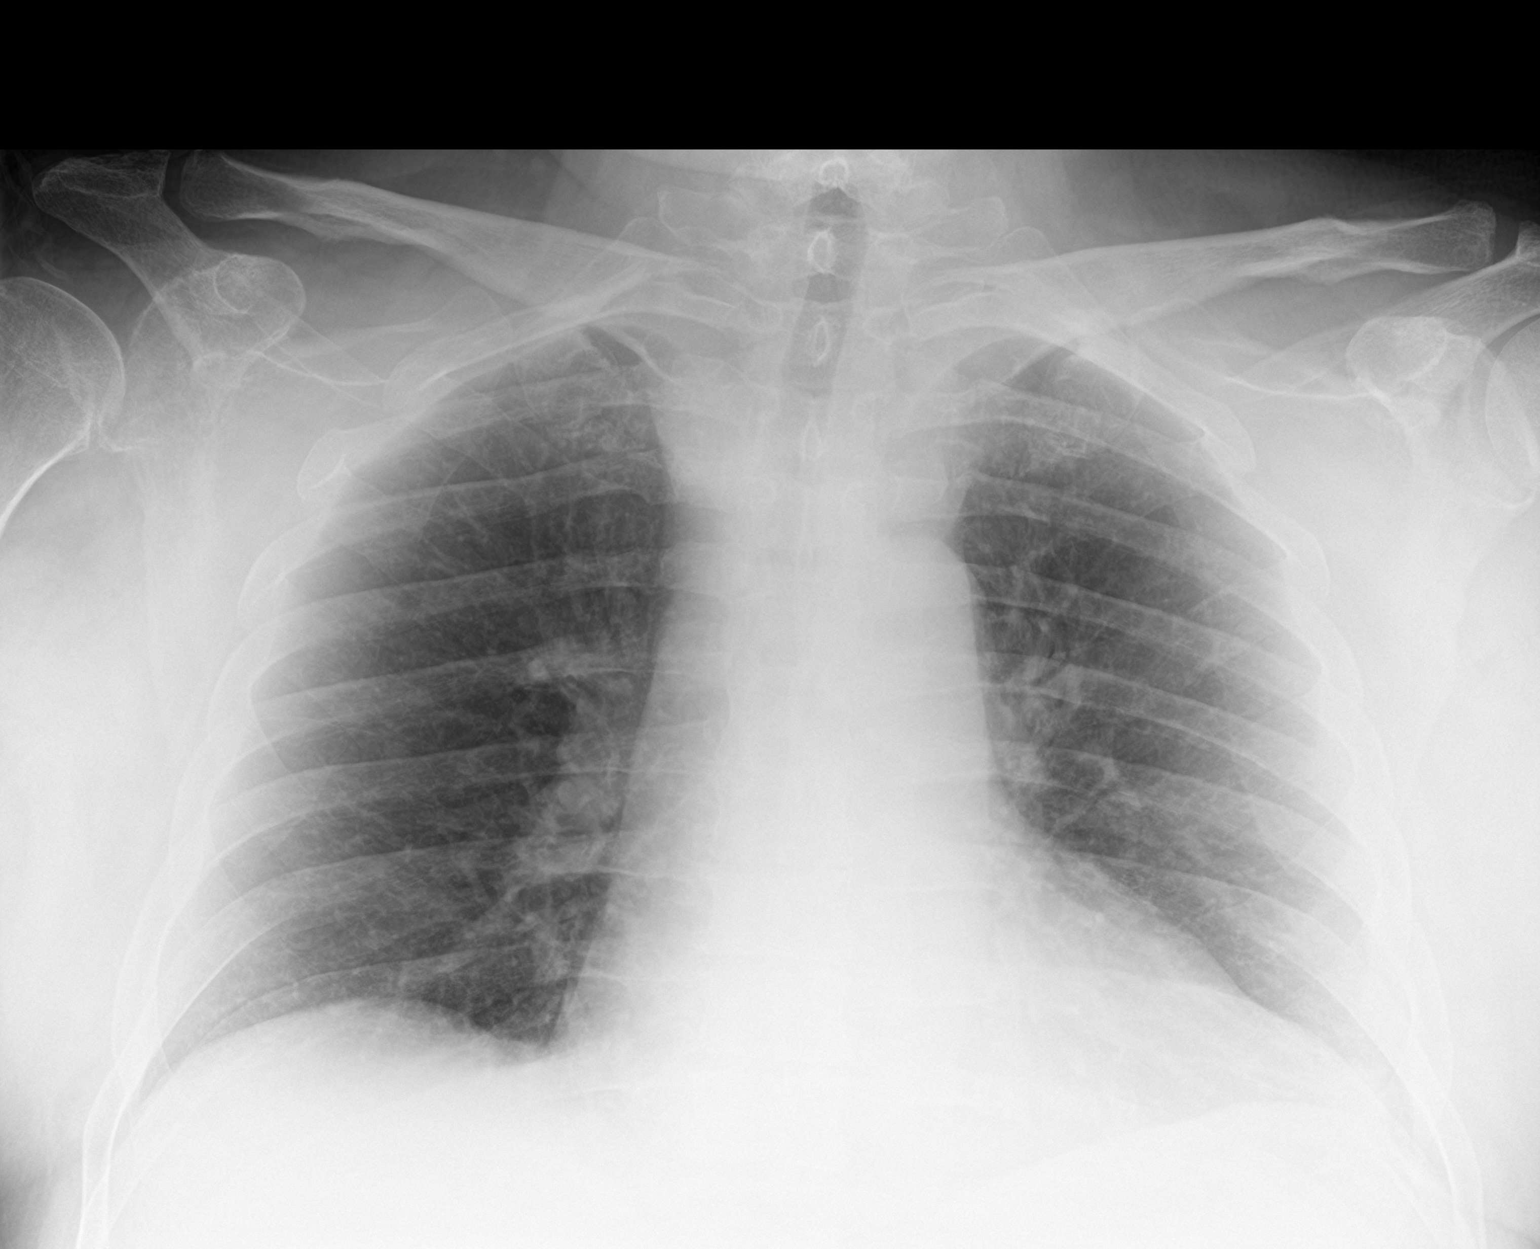

[chest lat]
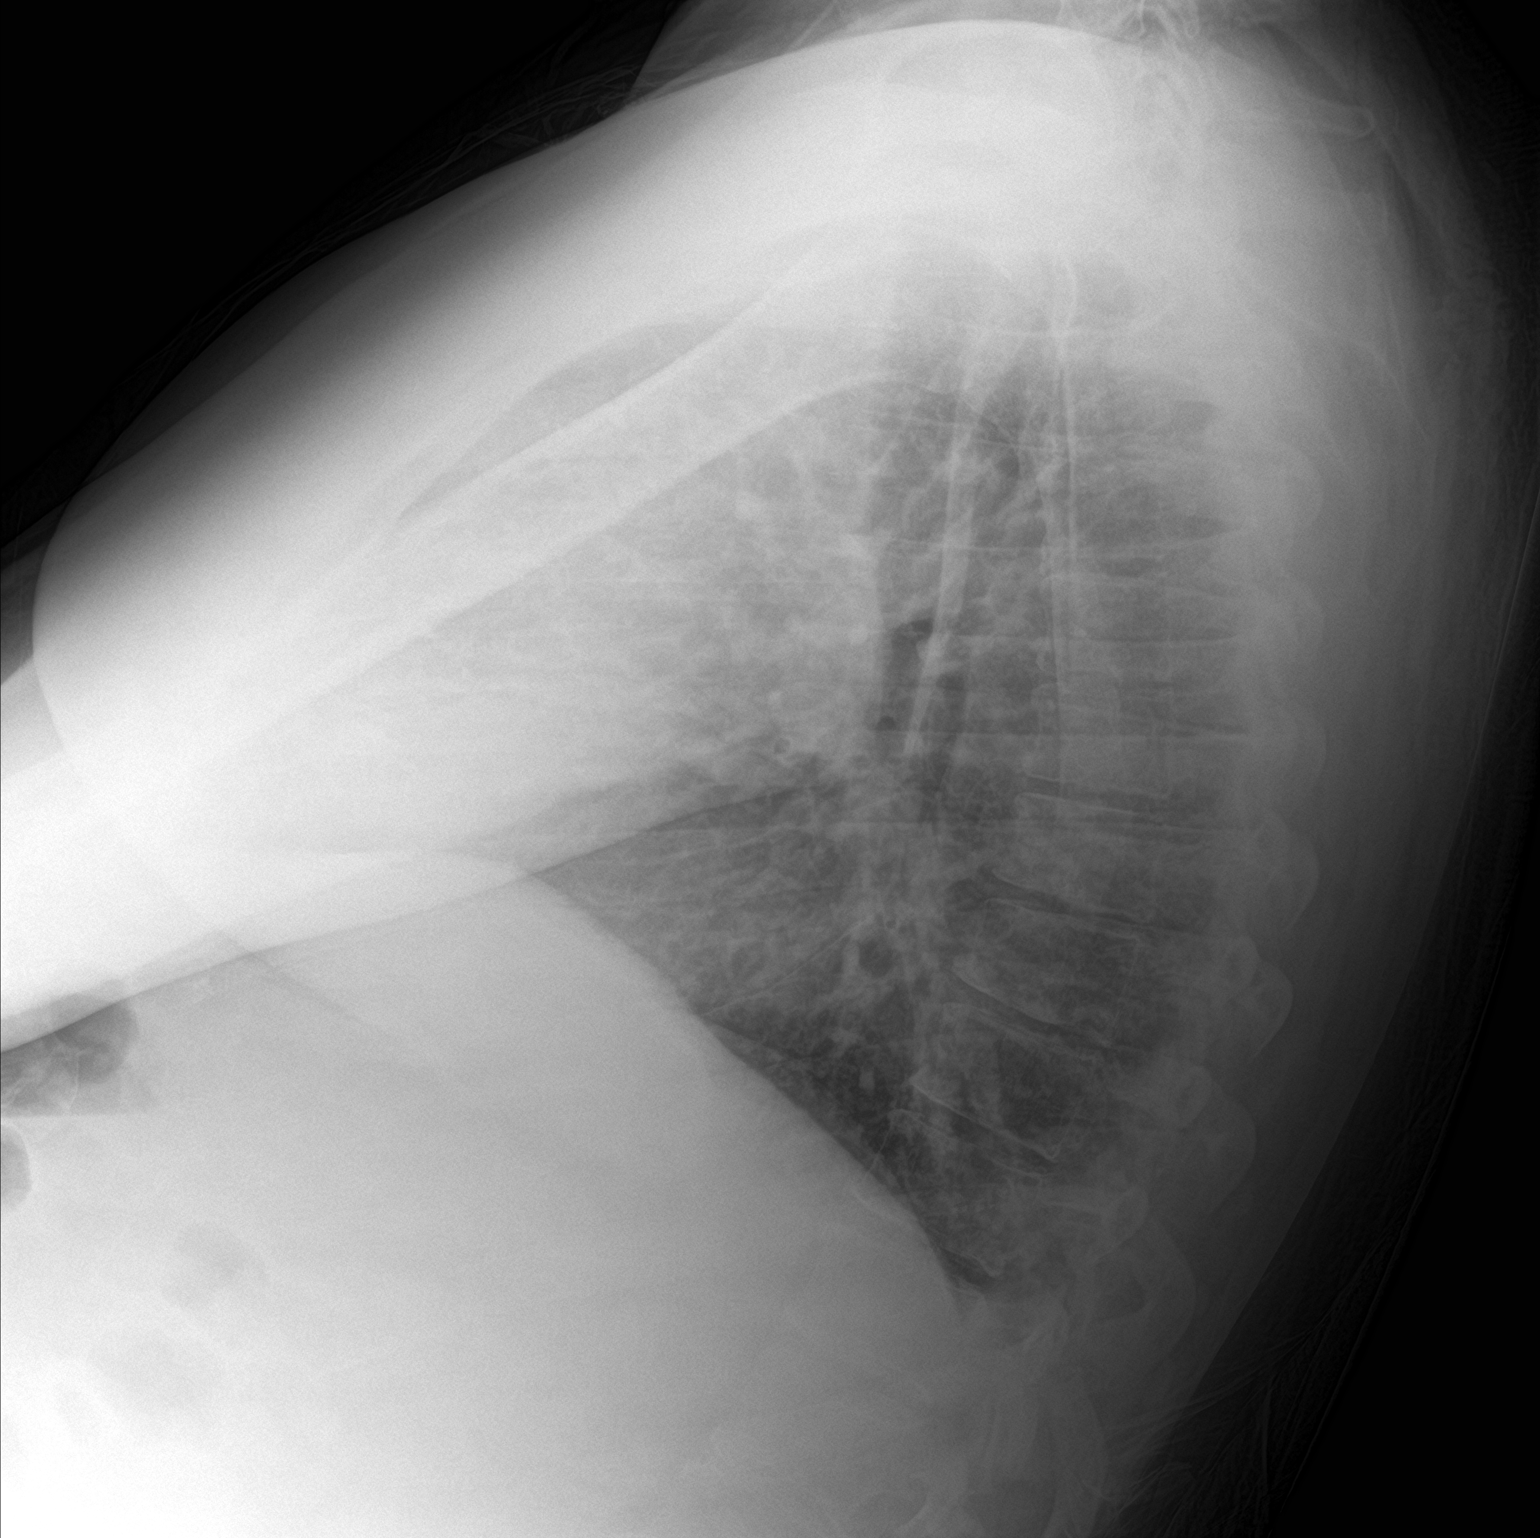

[2 of 2 positions shown; findings below may reference images not displayed]

FINDINGS: Cardiac shadow is stable. The lungs are well aerated bilaterally. No
focal infiltrate or sizable effusion is seen. No bony abnormality is
noted.
IMPRESSION: No acute abnormality seen.

## 2018-10-08 ENCOUNTER — Other Ambulatory Visit: Payer: Self-pay | Admitting: Family Medicine

## 2018-10-08 MED ORDER — DULAGLUTIDE 1.5 MG/0.5ML ~~LOC~~ SOAJ: SUBCUTANEOUS | 3 refills | Status: DC

## 2018-10-09 ENCOUNTER — Telehealth: Payer: Self-pay | Admitting: Family Medicine

## 2018-10-09 NOTE — Telephone Encounter (Signed)
FBS: 211,168,153,183,189,184,184,144,126,144,154,150,157,129,109,127,142,117,121,129,136,111,129,109,127,142, 117,128,122  2 hours PP - 326,384,230,169,197,212,319,169,202,221,223,239,197,207,242,292,273,192,222,212,231,216,226,236,225,241,210, 199,189

## 2018-10-10 NOTE — Telephone Encounter (Signed)
Increase lantus to 55 units and add 7 units of novolog with supper.

## 2018-10-11 NOTE — Telephone Encounter (Signed)
LMTRC

## 2018-10-14 ENCOUNTER — Encounter: Payer: Self-pay | Admitting: Physical Therapy

## 2018-10-14 NOTE — Therapy (Signed)
[facility] [address] [place], [place], [address] Phone: [phone]   Fax:  [phone]  Patient Details  Name: Eduardo Young MRN: [id number] Date of Birth: 12/28/1955 Referring Provider:  Alger Simons, MD  Encounter Date: 10/14/2018  PHYSICAL THERAPY DISCHARGE SUMMARY  Visits from Start of Care: 13  Current functional level related to goals / functional outcomes: See last PT note on 11/13/2016   Remaining deficits: See last PT note on 11/13/2016   Education / Equipment: HEP  Plan: Patient agrees to discharge.  Patient goals were not met. Patient is being discharged due to not returning since the last visit.  ?????          Yavonne Kiss PT, DPT 10/14/2018, 3:16 PM  [facility] [address] [place], [place], [address] Phone: [phone]   Fax:  [phone]

## 2018-10-29 ENCOUNTER — Other Ambulatory Visit: Payer: Self-pay | Admitting: Family Medicine

## 2018-10-29 DIAGNOSIS — I10 Essential (primary) hypertension: Secondary | ICD-10-CM

## 2018-10-29 MED ORDER — INSULIN LISPRO (1 UNIT DIAL) 100 UNIT/ML (KWIKPEN): 7.0000 [IU] | PEN_INJECTOR | Freq: Every day | SUBCUTANEOUS | 1 refills | Status: DC

## 2018-10-29 NOTE — Telephone Encounter (Signed)
Eduardo Young aware and ins covers Humalog - med sent to [facility]

## 2018-11-03 ENCOUNTER — Other Ambulatory Visit: Payer: Self-pay | Admitting: Family Medicine

## 2018-11-03 DIAGNOSIS — I10 Essential (primary) hypertension: Secondary | ICD-10-CM

## 2018-11-19 ENCOUNTER — Other Ambulatory Visit: Payer: Self-pay | Admitting: Family Medicine

## 2018-12-02 ENCOUNTER — Other Ambulatory Visit: Payer: Self-pay | Admitting: Family Medicine

## 2018-12-02 DIAGNOSIS — I10 Essential (primary) hypertension: Secondary | ICD-10-CM

## 2018-12-04 ENCOUNTER — Other Ambulatory Visit: Payer: Self-pay | Admitting: Family Medicine

## 2018-12-04 DIAGNOSIS — I10 Essential (primary) hypertension: Secondary | ICD-10-CM

## 2018-12-09 DIAGNOSIS — E1351 Other specified diabetes mellitus with diabetic peripheral angiopathy without gangrene: Secondary | ICD-10-CM | POA: Diagnosis not present

## 2018-12-09 DIAGNOSIS — M205X1 Other deformities of toe(s) (acquired), right foot: Secondary | ICD-10-CM | POA: Diagnosis not present

## 2018-12-09 DIAGNOSIS — T148XXA Other injury of unspecified body region, initial encounter: Secondary | ICD-10-CM | POA: Diagnosis not present

## 2018-12-09 DIAGNOSIS — L602 Onychogryphosis: Secondary | ICD-10-CM | POA: Diagnosis not present

## 2019-01-03 ENCOUNTER — Other Ambulatory Visit: Payer: Self-pay | Admitting: Family Medicine

## 2019-01-03 DIAGNOSIS — I10 Essential (primary) hypertension: Secondary | ICD-10-CM

## 2019-01-07 ENCOUNTER — Other Ambulatory Visit: Payer: Self-pay | Admitting: Family Medicine

## 2019-02-01 ENCOUNTER — Other Ambulatory Visit: Payer: Self-pay | Admitting: Family Medicine

## 2019-02-01 DIAGNOSIS — I10 Essential (primary) hypertension: Secondary | ICD-10-CM

## 2019-02-18 DIAGNOSIS — L602 Onychogryphosis: Secondary | ICD-10-CM | POA: Diagnosis not present

## 2019-02-18 DIAGNOSIS — E1351 Other specified diabetes mellitus with diabetic peripheral angiopathy without gangrene: Secondary | ICD-10-CM | POA: Diagnosis not present

## 2019-03-01 ENCOUNTER — Other Ambulatory Visit: Payer: Self-pay | Admitting: Family Medicine

## 2019-03-01 DIAGNOSIS — I10 Essential (primary) hypertension: Secondary | ICD-10-CM

## 2019-03-30 ENCOUNTER — Other Ambulatory Visit: Payer: Self-pay | Admitting: Family Medicine

## 2019-04-01 ENCOUNTER — Ambulatory Visit (INDEPENDENT_AMBULATORY_CARE_PROVIDER_SITE_OTHER): Payer: Medicare Other | Admitting: Family Medicine

## 2019-04-01 ENCOUNTER — Other Ambulatory Visit: Payer: Self-pay

## 2019-04-01 ENCOUNTER — Encounter: Payer: Self-pay | Admitting: Family Medicine

## 2019-04-01 VITALS — BP 136/74 | HR 62 | Temp 98.2°F

## 2019-04-01 DIAGNOSIS — Z794 Long term (current) use of insulin: Secondary | ICD-10-CM

## 2019-04-01 DIAGNOSIS — E119 Type 2 diabetes mellitus without complications: Secondary | ICD-10-CM

## 2019-04-01 DIAGNOSIS — Z23 Encounter for immunization: Secondary | ICD-10-CM | POA: Diagnosis not present

## 2019-04-01 NOTE — Progress Notes (Signed)
Subjective:    Patient ID: Eduardo Young, male    DOB: [birth year], 63 y.o.   MRN: [id number]  HPI  08/26/18 Patient has not been seen in more than a year.  He has never had a colonoscopy.  Patient has a history of insulin-dependent diabetes mellitus.  He is overdue for an A1c.  He states for the last 4 to 5 weeks, he has been seeing frank blood in the stool.  At times it is mixed in with the stool.  At other times it smeared on the outside but it is always bright red blood.  He denies any pain with defecation or abdominal pain.  He denies any dizziness, lightheadedness, or fatigue.  He denies any chest pain or shortness of breath.  He denies any syncope.  He also has 2 moles on his body that have been concerned.  One is a 6 mm mole on his left shoulder adjacent to a tattoo that is changing and growing in size.  The second is a 5 to 6 mm mole directly below his right nipple on his upper abdomen that is also changing and growing in size according to the patient.  He is requesting a biopsy of both of these lesions today.  At that time, my plan was: I anesthetized the lesion on his left shoulder was 0.1% lidocaine with epinephrine.  Shave biopsy was performed.  Hemostasis was achieved with Drysol and a Band-Aid and lesion was sent to pathology in a labeled container.  I then anesthetized the lesion directly below his right nipple on his upper abdomen with 0.1% lidocaine with epinephrine.  Using sterile technique a shave biopsy was performed and the lesion was sent to pathology labeled container.  Hemostasis was achieved with Drysol and a Band-Aid.  Regarding this blood in the stool, rectal exam was performed today which is normal.  There are no palpable lesions.  However the patient needs a colonoscopy as soon as possible.  I will also check a CBC to evaluate for any signs of significant anemia.  While checking lab work I will also obtain a CMP and an A1c to monitor the management of his diabetes.  Follow-up  with GI as soon as possible.  09/10/18 Biopsy of the left shoulder revealed atypical melanocytic cells: B Source   Comment: Skin Biopsy, left shoulder  B Gross Description   Comment: Received in a single container of formalin  labeled with two patient identifiers, patient's  name and DOB is a 0.8 x 0.8 x 0.1 cm skin shave  displaying a tan scaly epidermal surface with  centrally located flat brown scaly hyperpigmented  lesion measuring 0.6 x 0.6 cm and is 0.1 cm from  the nearest margin. The surgical margin is inked  orange. The shave is trisected and entirely  submitted in cassette B.    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  Microscopic description: There are discrete  nests of uniform melanocytes at the  dermal-epidermal junction and within the dermis.   Re-excision of the lesion to ensure complete  removal would be judicious.     Patient is here today for wider excision.  We also discussed his hemoglobin A1c of 10.9.  Patient states that he is taking his insulin however he is not checking his sugars at all.  He also admits to eating [employer] doughnuts frequently.  I explained to the patient that is going  to be difficult to control his blood sugars if he is not checking them.  I explained that it is dangerous to rapidly increase insulin without any idea of how his blood sugars are responding.  Therefore we have recommended starting the patient on Trulicity to try to help address his postprandial sugars in a safe fashion without increasing his risk for hypoglycemia significantly.  Patient has yet to start the medication.  At that time, my plan was:  Atypical melanocytic cells on previous skin biopsy  Area diagrammed in his history of present illness and physical exam was anesthetized with 0.1% lidocaine with epinephrine.  An elliptical excision was performed around the lesion with 5 mm margins in all directions.  This  was excised down to the underlying dermis using sterile technique after the patient had been prepped and draped in sterile fashion.  The lesion was sent to pathology in a labeled container.  Skin edges were approximated with 4 simple interrupted 3-0 Ethilon sutures.  Await the results of the biopsy.  Return in 1 week for suture removal.  At that time I would like him to bring in fasting blood sugars and 2-hour postprandial sugars so that we can uptitrate his insulin accordingly.  09/17/18 Biopsy margins were confirmed to be clear of any residual atypical melanocytic cells.  He is here today for suture removal.  4 sutures were removed without difficulty.  There is no evidence of cellulitis.  However he has not had a bowel movement in more than a week despite taking MiraLAX.  He is also not had a pneumonia vaccine or flu shot.  He states that his fasting blood sugars are between 150 and 180.  2-hour postprandial sugars are usually 200.  He is currently on 45 units of Lantus and he just began Trulicity this week.  At that time, my plan was: Regarding his diabetes I recommended gradually increasing his Lantus from 45 units to 60 units in 5 unit increments.  He will increase by 5 units and then check his fasting blood sugar.  Fasting blood sugars greater than 130, he will continue to increase up until he is on 60 units of Lantus once daily in addition to his Trulicity.  We will add Linzess 145 mcg p.o. daily for chronic constipation.  If after 2 or 3 days if the patient has not had a bowel movement I will suggest using a stimulant laxative such as Dulcolax.  04/01/19 Patient is here today for follow-up.  He never followed up as planned.  His hemoglobin A1c and February was 10.5.  He did increase his Lantus from 45 to 55 units but he stopped there.  His sister admits that they have not checked his blood sugar in several months at all.  He has no idea what his sugar is running.  However he denies any hypoglycemic  episodes.  He denies any polyuria, polydipsia, or blurry vision.  He denies any chest pain, shortness of breath, dyspnea on exertion.  Blood pressure today is well controlled at 136/74.  Patient's sister says that they are having a difficult time affording his Tradjenta.  Aside from [place] they were only taking Lantus 55 units a day.  Patient has stage III chronic kidney disease with a baseline creatinine greater than 1.5 making metformin unsafe.  He has significant edema in his legs making Actos unsafe.  He is on high-dose insulin making glipizide ineffective.  He does not check his blood sugar making up titration with insulin very  dangerous.  Therefore they are here today to discuss his options  Past Medical History:  Diagnosis Date  . Chronic venous insufficiency   . Diabetes mellitus   . GERD (gastroesophageal reflux disease)   . Gout   . Hemiparesis ([place])   . Hyperlipidemia   . Hypertension   . Obesity   . Renal calculi   . Sleep apnea, obstructive   . Stroke [facility])    [id number]   Past Surgical History:  Procedure Laterality Date  . BIOPSY  09/06/2018   Procedure: BIOPSY;  Surgeon: Jerene Bears, MD;  Location: [facility];  Service: Gastroenterology;;  . COLONOSCOPY WITH PROPOFOL N/A 09/06/2018   Procedure: COLONOSCOPY WITH PROPOFOL;  Surgeon: Jerene Bears, MD;  Location: [facility];  Service: Gastroenterology;  Laterality: N/A;  . LITHOTRIPSY    . NASAL POLYP EXCISION     Current Outpatient Medications on File Prior to Visit  Medication Sig Dispense Refill  . ACCU-CHEK FASTCLIX LANCETS MISC Check BS BID DX - E11.9 200 each 3  . allopurinol (ZYLOPRIM) 100 MG tablet Take 1 tablet by mouth twice daily 180 tablet 0  . amLODipine (NORVASC) 10 MG tablet TAKE 1 TABLET BY MOUTH ONCE DAILY 90 tablet 3  . aspirin 81 MG tablet Take 81 mg by mouth every evening.     Marland Kitchen atorvastatin (LIPITOR) 40 MG tablet Take 1 tablet by mouth once daily 90 tablet 0  . bisacodyl (BISACODYL) 5 MG EC  tablet Take 1 tablet (5 mg total) by mouth daily as needed for moderate constipation. 30 tablet 0  . Blood Glucose Monitoring Suppl (ACCU-CHEK AVIVA PLUS) w/Device KIT Check BS BID DX - E11.9 1 kit 1  . cloNIDine (CATAPRES) 0.2 MG tablet Take 1 tablet by mouth twice daily 180 tablet 3  . doxazosin (CARDURA) 4 MG tablet Take 1 tablet by mouth once daily 90 tablet 0  . glucose blood (ACCU-CHEK AVIVA) test strip Check BS BID DX - E11.9 150 each 3  . hydrALAZINE (APRESOLINE) 25 MG tablet TAKE 2 TABLETS BY MOUTH THREE TIMES DAILY 180 tablet 0  . hydrochlorothiazide (HYDRODIURIL) 25 MG tablet Take 1 tablet by mouth once daily 90 tablet 0  . Insulin Glargine (LANTUS SOLOSTAR) 100 UNIT/ML Solostar Pen Inject 45 Units into the skin daily. 5 pen 11  . insulin lispro (HUMALOG KWIKPEN) 100 UNIT/ML KwikPen Inject 0.07 mLs (7 Units total) into the skin daily. 5 mL 1  . labetalol (NORMODYNE) 300 MG tablet Take 1 tablet by mouth twice daily 180 tablet 0  . Lancets Misc. (ACCU-CHEK FASTCLIX LANCET) KIT Check BS BID DX - E11.9 1 kit 1  . lisinopril (ZESTRIL) 20 MG tablet Take 1 tablet by mouth twice daily 180 tablet 0  . Multiple Vitamin (MULTIVITAMIN WITH MINERALS) TABS tablet Take 1 tablet by mouth daily.    . polyethylene glycol powder (GLYCOLAX/MIRALAX) powder Take 17 g by mouth daily. 510 g 11  . TRADJENTA 5 MG TABS tablet Take 1 tablet by mouth once daily 90 tablet 0  . linaclotide (LINZESS) 145 MCG CAPS capsule Take 1 capsule (145 mcg total) by mouth daily before breakfast. (Patient not taking: Reported on 04/01/2019) 30 capsule 3   No current facility-administered medications on file prior to visit.    No Known Allergies Social History   Socioeconomic History  . Marital status: Single    Spouse name: Not on file  . Number of children: Not on file  . Years of education: Not on file  .  Highest education level: Not on file  Occupational History  . Not on file  Social Needs  . Financial resource  strain: Not on file  . Food insecurity    Worry: Not on file    Inability: Not on file  . Transportation needs    Medical: Not on file    Non-medical: Not on file  Tobacco Use  . Smoking status: Former Research scientist (life sciences)  . Smokeless tobacco: Never Used  Substance and Sexual Activity  . Alcohol use: Yes    Comment: Occasional  . Drug use: Yes    Types: Cocaine, Marijuana    Comment: Past Hx  . Sexual activity: Not on file  Lifestyle  . Physical activity    Days per week: Not on file    Minutes per session: Not on file  . Stress: Not on file  Relationships  . Social Herbalist on phone: Not on file    Gets together: Not on file    Attends religious service: Not on file    Active member of club or organization: Not on file    Attends meetings of clubs or organizations: Not on file    Relationship status: Not on file  . Intimate partner violence    Fear of current or ex partner: Not on file    Emotionally abused: Not on file    Physically abused: Not on file    Forced sexual activity: Not on file  Other Topics Concern  . Not on file  Social History Narrative  . Not on file      Review of Systems  All other systems reviewed and are negative.      Objective:   Physical Exam  Constitutional: He appears well-developed and well-nourished. No distress.  Cardiovascular: Normal rate, regular rhythm and normal heart sounds.  No murmur heard. Pulmonary/Chest: Effort normal and breath sounds normal. No respiratory distress. He has no wheezes. He has no rales. He exhibits no tenderness.  Skin: He is not diaphoretic.  Vitals reviewed.     Type 2 diabetes mellitus without complication, with long-term current use of insulin (HCC) - Plan: Hemoglobin A1c, CBC with Differential/Platelet, COMPLETE METABOLIC PANEL WITH GFR  Need for immunization against influenza - Plan: Flu Vaccine QUAD 36+ mos IM  I have explained to the patient and his sister that it is impossible to  regulate his blood sugars without actually checking his blood sugar.  It makes it very dangerous to change his insulin as he runs the risk of hypoglycemia.  I explained why we cannot use metformin given his chronic kidney disease.  I explained why glipizide was likely going to be ineffective due to his insulin resistance.  I explained to them that Actos would increase leg swelling.  I have recommended discontinuing Tradjenta and replacing with Trulicity 1.5 mg subcu weekly and then rechecking fasting blood sugar in 1 month.  I will check baseline labs prior to making this change including a hemoglobin A1c and a CMP.  The patient is not fasting and therefore cannot check his cholesterol.  He did receive a flu shot today

## 2019-04-02 LAB — CBC WITH DIFFERENTIAL/PLATELET
Absolute Monocytes: 645 cells/uL (ref 200–950)
Basophils Absolute: 42 cells/uL (ref 0–200)
Basophils Relative: 0.4 %
Eosinophils Absolute: 707 cells/uL — ABNORMAL HIGH (ref 15–500)
Eosinophils Relative: 6.8 %
HCT: 36.3 % — ABNORMAL LOW (ref 38.5–50.0)
Hemoglobin: 12.3 g/dL — ABNORMAL LOW (ref 13.2–17.1)
Lymphs Abs: 2142 cells/uL (ref 850–3900)
MCH: 28.9 pg (ref 27.0–33.0)
MCHC: 33.9 g/dL (ref 32.0–36.0)
MCV: 85.4 fL (ref 80.0–100.0)
MPV: 10.6 fL (ref 7.5–12.5)
Monocytes Relative: 6.2 %
Neutro Abs: 6864 cells/uL (ref 1500–7800)
Neutrophils Relative %: 66 %
Platelets: 228 10*3/uL (ref 140–400)
RBC: 4.25 10*6/uL (ref 4.20–5.80)
RDW: 14.2 % (ref 11.0–15.0)
Total Lymphocyte: 20.6 %
WBC: 10.4 10*3/uL (ref 3.8–10.8)

## 2019-04-02 LAB — COMPLETE METABOLIC PANEL WITH GFR
AG Ratio: 1.7 (calc) (ref 1.0–2.5)
ALT: 10 U/L (ref 9–46)
AST: 10 U/L (ref 10–35)
Albumin: 3.8 g/dL (ref 3.6–5.1)
Alkaline phosphatase (APISO): 82 U/L (ref 35–144)
BUN/Creatinine Ratio: 15 (calc) (ref 6–22)
BUN: 28 mg/dL — ABNORMAL HIGH (ref 7–25)
CO2: 28 mmol/L (ref 20–32)
Calcium: 8.9 mg/dL (ref 8.6–10.3)
Chloride: 103 mmol/L (ref 98–110)
Creat: 1.81 mg/dL — ABNORMAL HIGH (ref 0.70–1.25)
GFR, Est African American: 46 mL/min/{1.73_m2} — ABNORMAL LOW (ref >=60)
GFR, Est Non African American: 39 mL/min/{1.73_m2} — ABNORMAL LOW (ref >=60)
Globulin: 2.3 g/dL (calc) (ref 1.9–3.7)
Glucose, Bld: 118 mg/dL — ABNORMAL HIGH (ref 65–99)
Potassium: 3.1 mmol/L — ABNORMAL LOW (ref 3.5–5.3)
Sodium: 142 mmol/L (ref 135–146)
Total Bilirubin: 0.4 mg/dL (ref 0.2–1.2)
Total Protein: 6.1 g/dL (ref 6.1–8.1)

## 2019-04-02 LAB — HEMOGLOBIN A1C
Hgb A1c MFr Bld: 6.6 % of total Hgb — ABNORMAL HIGH (ref <5.7)
Mean Plasma Glucose: 143 (calc)
eAG (mmol/L): 7.9 (calc)

## 2019-04-05 ENCOUNTER — Other Ambulatory Visit: Payer: Self-pay | Admitting: Family Medicine

## 2019-04-05 DIAGNOSIS — I10 Essential (primary) hypertension: Secondary | ICD-10-CM

## 2019-04-09 ENCOUNTER — Other Ambulatory Visit: Payer: Self-pay | Admitting: Family Medicine

## 2019-04-09 DIAGNOSIS — E876 Hypokalemia: Secondary | ICD-10-CM

## 2019-04-09 MED ORDER — POTASSIUM CHLORIDE CRYS ER 20 MEQ PO TBCR: 20.0000 meq | EXTENDED_RELEASE_TABLET | Freq: Every day | ORAL | 3 refills | Status: DC

## 2019-04-21 ENCOUNTER — Other Ambulatory Visit: Payer: Self-pay | Admitting: Family Medicine

## 2019-04-24 ENCOUNTER — Other Ambulatory Visit: Payer: Medicare Other

## 2019-04-24 ENCOUNTER — Other Ambulatory Visit: Payer: Self-pay

## 2019-04-24 DIAGNOSIS — E876 Hypokalemia: Secondary | ICD-10-CM | POA: Diagnosis not present

## 2019-04-25 ENCOUNTER — Other Ambulatory Visit: Payer: Self-pay | Admitting: Family Medicine

## 2019-04-25 LAB — BASIC METABOLIC PANEL
BUN/Creatinine Ratio: 13 (calc) (ref 6–22)
BUN: 22 mg/dL (ref 7–25)
CO2: 26 mmol/L (ref 20–32)
Calcium: 8.7 mg/dL (ref 8.6–10.3)
Chloride: 106 mmol/L (ref 98–110)
Creat: 1.66 mg/dL — ABNORMAL HIGH (ref 0.70–1.25)
Glucose, Bld: 212 mg/dL — ABNORMAL HIGH (ref 65–99)
Potassium: 3.8 mmol/L (ref 3.5–5.3)
Sodium: 143 mmol/L (ref 135–146)

## 2019-05-04 ENCOUNTER — Other Ambulatory Visit: Payer: Self-pay | Admitting: Family Medicine

## 2019-05-04 DIAGNOSIS — I10 Essential (primary) hypertension: Secondary | ICD-10-CM

## 2019-05-06 DIAGNOSIS — E1351 Other specified diabetes mellitus with diabetic peripheral angiopathy without gangrene: Secondary | ICD-10-CM | POA: Diagnosis not present

## 2019-05-06 DIAGNOSIS — L602 Onychogryphosis: Secondary | ICD-10-CM | POA: Diagnosis not present

## 2019-05-22 ENCOUNTER — Other Ambulatory Visit: Payer: Self-pay | Admitting: Family Medicine

## 2019-06-04 ENCOUNTER — Other Ambulatory Visit: Payer: Self-pay | Admitting: Family Medicine

## 2019-06-04 DIAGNOSIS — I10 Essential (primary) hypertension: Secondary | ICD-10-CM

## 2019-07-01 ENCOUNTER — Other Ambulatory Visit: Payer: Self-pay | Admitting: Family Medicine

## 2019-07-01 DIAGNOSIS — I10 Essential (primary) hypertension: Secondary | ICD-10-CM

## 2019-07-22 DIAGNOSIS — E1351 Other specified diabetes mellitus with diabetic peripheral angiopathy without gangrene: Secondary | ICD-10-CM | POA: Diagnosis not present

## 2019-07-22 DIAGNOSIS — L602 Onychogryphosis: Secondary | ICD-10-CM | POA: Diagnosis not present

## 2019-07-29 ENCOUNTER — Other Ambulatory Visit: Payer: Self-pay | Admitting: Family Medicine

## 2019-07-29 DIAGNOSIS — I10 Essential (primary) hypertension: Secondary | ICD-10-CM

## 2019-08-03 ENCOUNTER — Other Ambulatory Visit: Payer: Self-pay | Admitting: Family Medicine

## 2019-08-29 ENCOUNTER — Other Ambulatory Visit: Payer: Self-pay | Admitting: Family Medicine

## 2019-08-29 DIAGNOSIS — I10 Essential (primary) hypertension: Secondary | ICD-10-CM

## 2019-09-02 DIAGNOSIS — L602 Onychogryphosis: Secondary | ICD-10-CM | POA: Diagnosis not present

## 2019-09-02 DIAGNOSIS — E1351 Other specified diabetes mellitus with diabetic peripheral angiopathy without gangrene: Secondary | ICD-10-CM | POA: Diagnosis not present

## 2019-09-02 DIAGNOSIS — I872 Venous insufficiency (chronic) (peripheral): Secondary | ICD-10-CM | POA: Diagnosis not present

## 2019-09-02 DIAGNOSIS — I83019 Varicose veins of right lower extremity with ulcer of unspecified site: Secondary | ICD-10-CM | POA: Diagnosis not present

## 2019-09-29 ENCOUNTER — Encounter: Payer: Self-pay | Admitting: Family Medicine

## 2019-09-29 ENCOUNTER — Ambulatory Visit (INDEPENDENT_AMBULATORY_CARE_PROVIDER_SITE_OTHER): Payer: Medicare Other | Admitting: Family Medicine

## 2019-09-29 ENCOUNTER — Other Ambulatory Visit: Payer: Self-pay

## 2019-09-29 VITALS — BP 148/86 | HR 74 | Temp 98.2°F | Resp 18

## 2019-09-29 DIAGNOSIS — N183 Chronic kidney disease, stage 3 unspecified: Secondary | ICD-10-CM | POA: Diagnosis not present

## 2019-09-29 DIAGNOSIS — M25551 Pain in right hip: Secondary | ICD-10-CM

## 2019-09-29 DIAGNOSIS — E119 Type 2 diabetes mellitus without complications: Secondary | ICD-10-CM

## 2019-09-29 DIAGNOSIS — Z794 Long term (current) use of insulin: Secondary | ICD-10-CM

## 2019-09-29 DIAGNOSIS — I10 Essential (primary) hypertension: Secondary | ICD-10-CM

## 2019-09-29 DIAGNOSIS — R0609 Other forms of dyspnea: Secondary | ICD-10-CM

## 2019-09-29 DIAGNOSIS — R0600 Dyspnea, unspecified: Secondary | ICD-10-CM

## 2019-09-29 NOTE — Progress Notes (Signed)
Subjective:    Patient ID: Eduardo Young, male    DOB: 1956/03/13, 64 y.o.   MRN: [id number]  Diabetes    08/26/18 Patient has not been seen in more than a year.  He has never had a colonoscopy.  Patient has a history of insulin-dependent diabetes mellitus.  He is overdue for an A1c.  He states for the last 4 to 5 weeks, he has been seeing frank blood in the stool.  At times it is mixed in with the stool.  At other times it smeared on the outside but it is always bright red blood.  He denies any pain with defecation or abdominal pain.  He denies any dizziness, lightheadedness, or fatigue.  He denies any chest pain or shortness of breath.  He denies any syncope.  He also has 2 moles on his body that have been concerned.  One is a 6 mm mole on his left shoulder adjacent to a tattoo that is changing and growing in size.  The second is a 5 to 6 mm mole directly below his right nipple on his upper abdomen that is also changing and growing in size according to the patient.  He is requesting a biopsy of both of these lesions today.  At that time, my plan was: I anesthetized the lesion on his left shoulder was 0.1% lidocaine with epinephrine.  Shave biopsy was performed.  Hemostasis was achieved with Drysol and a Band-Aid and lesion was sent to pathology in a labeled container.  I then anesthetized the lesion directly below his right nipple on his upper abdomen with 0.1% lidocaine with epinephrine.  Using sterile technique a shave biopsy was performed and the lesion was sent to pathology labeled container.  Hemostasis was achieved with Drysol and a Band-Aid.  Regarding this blood in the stool, rectal exam was performed today which is normal.  There are no palpable lesions.  However the patient needs a colonoscopy as soon as possible.  I will also check a CBC to evaluate for any signs of significant anemia.  While checking lab work I will also obtain a CMP and an A1c to monitor the management of his diabetes.   Follow-up with GI as soon as possible.  09/10/18 Biopsy of the left shoulder revealed atypical melanocytic cells: B Source   Comment: Skin Biopsy, left shoulder  B Gross Description   Comment: Received in a single container of formalin  labeled with two patient identifiers, patient's  name and DOB is a 0.8 x 0.8 x 0.1 cm skin shave  displaying a tan scaly epidermal surface with  centrally located flat brown scaly hyperpigmented  lesion measuring 0.6 x 0.6 cm and is 0.1 cm from  the nearest margin. The surgical margin is inked  orange. The shave is trisected and entirely  submitted in cassette B.    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  Microscopic description: There are discrete  nests of uniform melanocytes at the  dermal-epidermal junction and within the dermis.   Re-excision of the lesion to ensure complete  removal would be judicious.     Patient is here today for wider excision.  We also discussed his hemoglobin A1c of 10.9.  Patient states that he is taking his insulin however he is not checking his sugars at all.  He also admits to eating [employer] doughnuts frequently.  I explained to the patient that  is going to be difficult to control his blood sugars if he is not checking them.  I explained that it is dangerous to rapidly increase insulin without any idea of how his blood sugars are responding.  Therefore we have recommended starting the patient on Trulicity to try to help address his postprandial sugars in a safe fashion without increasing his risk for hypoglycemia significantly.  Patient has yet to start the medication.  At that time, my plan was:  Atypical melanocytic cells on previous skin biopsy  Area diagrammed in his history of present illness and physical exam was anesthetized with 0.1% lidocaine with epinephrine.  An elliptical excision was performed around the lesion with 5 mm margins in all  directions.  This was excised down to the underlying dermis using sterile technique after the patient had been prepped and draped in sterile fashion.  The lesion was sent to pathology in a labeled container.  Skin edges were approximated with 4 simple interrupted 3-0 Ethilon sutures.  Await the results of the biopsy.  Return in 1 week for suture removal.  At that time I would like him to bring in fasting blood sugars and 2-hour postprandial sugars so that we can uptitrate his insulin accordingly.  09/17/18 Biopsy margins were confirmed to be clear of any residual atypical melanocytic cells.  He is here today for suture removal.  4 sutures were removed without difficulty.  There is no evidence of cellulitis.  However he has not had a bowel movement in more than a week despite taking MiraLAX.  He is also not had a pneumonia vaccine or flu shot.  He states that his fasting blood sugars are between 150 and 180.  2-hour postprandial sugars are usually 200.  He is currently on 45 units of Lantus and he just began Trulicity this week.  At that time, my plan was: Regarding his diabetes I recommended gradually increasing his Lantus from 45 units to 60 units in 5 unit increments.  He will increase by 5 units and then check his fasting blood sugar.  Fasting blood sugars greater than 130, he will continue to increase up until he is on 60 units of Lantus once daily in addition to his Trulicity.  We will add Linzess 145 mcg p.o. daily for chronic constipation.  If after 2 or 3 days if the patient has not had a bowel movement I will suggest using a stimulant laxative such as Dulcolax.  04/01/19 Patient is here today for follow-up.  He never followed up as planned.  His hemoglobin A1c and February was 10.5.  He did increase his Lantus from 45 to 55 units but he stopped there.  His sister admits that they have not checked his blood sugar in several months at all.  He has no idea what his sugar is running.  However he denies any  hypoglycemic episodes.  He denies any polyuria, polydipsia, or blurry vision.  He denies any chest pain, shortness of breath, dyspnea on exertion.  Blood pressure today is well controlled at 136/74.  Patient's sister says that they are having a difficult time affording his Tradjenta.  Aside from [place] they were only taking Lantus 55 units a day.  Patient has stage III chronic kidney disease with a baseline creatinine greater than 1.5 making metformin unsafe.  He has significant edema in his legs making Actos unsafe.  He is on high-dose insulin making glipizide ineffective.  He does not check his blood sugar making up titration with  insulin very dangerous.  Therefore they are here today to discuss his options.  At that time, my plan was: I have explained to the patient and his sister that it is impossible to regulate his blood sugars without actually checking his blood sugar.  It makes it very dangerous to change his insulin as he runs the risk of hypoglycemia.  I explained why we cannot use metformin given his chronic kidney disease.  I explained why glipizide was likely going to be ineffective due to his insulin resistance.  I explained to them that Actos would increase leg swelling.  I have recommended discontinuing Tradjenta and replacing with Trulicity 1.5 mg subcu weekly and then rechecking fasting blood sugar in 1 month.  I will check baseline labs prior to making this change including a hemoglobin A1c and a CMP.  The patient is not fasting and therefore cannot check his cholesterol.  He did receive a flu shot today  09/29/19 Patient recently had a housecalls Dr. Jerl Santos to their home and do an assessment.  Daughter is concerned that patient may have congestive heart failure due to the amount of swelling in his legs.  Today on examination he does have +1 pitting edema in both legs distal to his knees.  It is more pronounced in the right leg versus left leg.  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  However given the swelling in his legs, the physician that saw him from his insurance, he is concerned that he may have congestive heart failure it is recommended that we check congestive heart failure.  He also mention that the patient needs to be seen every 3 months for hemoglobin A1c.  Patient's compliance has been a chronic issue.  Patient is no longer taking rapid acting insulin with his meals at night.  He is still taking Lantus 55 units a day.  Fortunately at his last assessment his hemoglobin A1c was good at 6.6.  What is concerning the patient now has pain in his right hip and right knee.  He reports pain over the lateral aspect of his right hip that radiates down to his right knee.  He is sedentary and sits majority of the day in a wheelchair.  He only stands and walks short distances because of his right hemiparesis.  Therefore osteoarthritis of the right hip would be highly unlikely.  He has no tenderness to palpation of the greater trochanteric bursa.  He only reports minimal pain with external rotation of the hip.  He denies any pain with flexion or extension of the knee or Apley grind.  There is no erythema or tenderness to palpation around the right knee.  There is no crepitus in the right knee.  Pain is out of proportion to exam of the hip and the knee leading me to believe it may be referred pain from his lower back such as sciatica that the patient is experiencing causing pain in his right hip.  He does have occasional falls and therefore a subtle fracture could also cause pain in the hip.  Patient's blood pressure is elevated today at 148/86.  When the housecalls physician made his assessment patient's blood pressure was 160/100.  We did discontinue his hydrochlorothiazide at his last visit due to his chronic kidney disease as well as hypokalemia.  Past Medical History:  Diagnosis Date  . Chronic venous insufficiency   . Diabetes mellitus   . GERD  (gastroesophageal reflux  disease)   . Gout   . Hemiparesis ([place])   . Hyperlipidemia   . Hypertension   . Obesity   . Renal calculi   . Sleep apnea, obstructive   . Stroke [facility])    [id number]   Past Surgical History:  Procedure Laterality Date  . BIOPSY  09/06/2018   Procedure: BIOPSY;  Surgeon: Jerene Bears, MD;  Location: [facility];  Service: Gastroenterology;;  . COLONOSCOPY WITH PROPOFOL N/A 09/06/2018   Procedure: COLONOSCOPY WITH PROPOFOL;  Surgeon: Jerene Bears, MD;  Location: [facility];  Service: Gastroenterology;  Laterality: N/A;  . LITHOTRIPSY    . NASAL POLYP EXCISION     Current Outpatient Medications on File Prior to Visit  Medication Sig Dispense Refill  . ACCU-CHEK FASTCLIX LANCETS MISC Check BS BID DX - E11.9 200 each 3  . allopurinol (ZYLOPRIM) 100 MG tablet Take 1 tablet by mouth twice daily 180 tablet 2  . amLODipine (NORVASC) 10 MG tablet Take 1 tablet by mouth once daily 90 tablet 2  . aspirin 81 MG tablet Take 81 mg by mouth every evening.     Marland Kitchen atorvastatin (LIPITOR) 40 MG tablet Take 1 tablet by mouth once daily 90 tablet 0  . bisacodyl (BISACODYL) 5 MG EC tablet Take 1 tablet (5 mg total) by mouth daily as needed for moderate constipation. 30 tablet 0  . Blood Glucose Monitoring Suppl (ACCU-CHEK AVIVA PLUS) w/Device KIT Check BS BID DX - E11.9 1 kit 1  . cloNIDine (CATAPRES) 0.2 MG tablet Take 1 tablet by mouth twice daily 180 tablet 3  . doxazosin (CARDURA) 4 MG tablet Take 1 tablet by mouth once daily 90 tablet 2  . glucose blood (ACCU-CHEK AVIVA) test strip Check BS BID DX - E11.9 150 each 3  . hydrALAZINE (APRESOLINE) 25 MG tablet TAKE 2 TABLETS BY MOUTH THREE TIMES DAILY 180 tablet 2  . insulin lispro (HUMALOG KWIKPEN) 100 UNIT/ML KwikPen Inject 0.07 mLs (7 Units total) into the skin daily. 5 mL 1  . labetalol (NORMODYNE) 300 MG tablet Take 1 tablet by mouth twice daily 180 tablet 2  . Lancets Misc. (ACCU-CHEK FASTCLIX LANCET) KIT Check BS BID DX -  E11.9 1 kit 1  . LANTUS SOLOSTAR 100 UNIT/ML Solostar Pen INJECT 45 UNITS SUBCUTANEOUSLY ONCE DAILY 15 mL 2  . linaclotide (LINZESS) 145 MCG CAPS capsule Take 1 capsule (145 mcg total) by mouth daily before breakfast. 30 capsule 3  . lisinopril (ZESTRIL) 20 MG tablet Take 1 tablet by mouth twice daily 180 tablet 2  . Multiple Vitamin (MULTIVITAMIN WITH MINERALS) TABS tablet Take 1 tablet by mouth daily.    . polyethylene glycol powder (GLYCOLAX/MIRALAX) powder Take 17 g by mouth daily. 510 g 11  . potassium chloride SA (K-DUR) 20 MEQ tablet Take 1 tablet (20 mEq total) by mouth daily. 30 tablet 3  . TRADJENTA 5 MG TABS tablet Take 1 tablet by mouth once daily 90 tablet 0   No current facility-administered medications on file prior to visit.   No Known Allergies Social History   Socioeconomic History  . Marital status: Single    Spouse name: Not on file  . Number of children: Not on file  . Years of education: Not on file  . Highest education level: Not on file  Occupational History  . Not on file  Tobacco Use  . Smoking status: Former Research scientist (life sciences)  . Smokeless tobacco: Never Used  Substance and Sexual Activity  . Alcohol use:  Yes    Comment: Occasional  . Drug use: Yes    Types: Cocaine, Marijuana    Comment: Past Hx  . Sexual activity: Not on file  Other Topics Concern  . Not on file  Social History Narrative  . Not on file   Social Determinants of Health   Financial Resource Strain:   . Difficulty of Paying Living Expenses: Not on file  Food Insecurity:   . Worried About Charity fundraiser in the Last Year: Not on file  . Ran Out of Food in the Last Year: Not on file  Transportation Needs:   . Lack of Transportation (Medical): Not on file  . Lack of Transportation (Non-Medical): Not on file  Physical Activity:   . Days of Exercise per Week: Not on file  . Minutes of Exercise per Session: Not on file  Stress:   . Feeling of Stress : Not on file  Social Connections:     . Frequency of Communication with Friends and Family: Not on file  . Frequency of Social Gatherings with Friends and Family: Not on file  . Attends Religious Services: Not on file  . Active Member of Clubs or Organizations: Not on file  . Attends Archivist Meetings: Not on file  . Marital Status: Not on file  Intimate Partner Violence:   . Fear of Current or Ex-Partner: Not on file  . Emotionally Abused: Not on file  . Physically Abused: Not on file  . Sexually Abused: Not on file      Review of Systems  All other systems reviewed and are negative.      Objective:   Physical Exam  Constitutional: He appears well-developed and well-nourished. No distress.  Neck: No JVD present.  Cardiovascular: Normal rate, regular rhythm and normal heart sounds. Exam reveals no gallop and no friction rub.  No murmur heard. Pulmonary/Chest: Effort normal and breath sounds normal. No respiratory distress. He has no wheezes. He has no rales. He exhibits no tenderness.  Abdominal: Soft. Bowel sounds are normal. He exhibits no distension. There is no abdominal tenderness. There is no rebound and no guarding.  Musculoskeletal:        General: Edema present.     Cervical back: Neck supple.     Right hip: No swelling, tenderness, bony tenderness or crepitus. Normal range of motion. Decreased strength.     Right knee: No swelling or effusion. Normal range of motion. No tenderness. No medial joint line or lateral joint line tenderness.  Lymphadenopathy:    He has no cervical adenopathy.  Skin: He is not diaphoretic.  Vitals reviewed.     Right hip pain - Plan: DG Hip Unilat W OR W/O Pelvis 2-3 Views Right  Dyspnea on exertion - Plan: ECHOCARDIOGRAM COMPLETE  Type 2 diabetes mellitus without complication, with long-term current use of insulin (HCC) - Plan: Hemoglobin A1c, CBC with Differential/Platelet, COMPLETE METABOLIC PANEL WITH GFR  Stage 3 chronic kidney disease, unspecified  whether stage 3a or 3b CKD  Benign essential HTN  Patient's blood pressure today is elevated although not as severe as it was at his home.  I would like to get his blood pressure under 140/90 due to his chronic kidney disease.  Therefore I will obtain a BMP today and if his renal function is amenable I will resume hydrochlorothiazide.  I will schedule the patient for an echocardiogram to evaluate for the presence of congestive heart failure.  I believe the majority  of patient's edema is dependent edema due to his hemiparesis however given his risk factors I certainly would believe it is reasonable to check an echocardiogram.  Regarding his diabetes I will check a hemoglobin A1c.  I agree totally with his Marketing executive that he needs an A1c every 3 months however his compliance in the past has kept him from doing this.  He frequently returns only only refuse refills.  Therefore I will encourage every 3 months to get hemoglobin A1c.  Regarding the pain in his right hip and his right knee, I believe the majority this could be sciatica.  I will obtain an x-ray of the right hip to rule out severe osteoarthritis, pathologic lesion within the bone, or occult fracture.  If x-rays are normal, I would work-up the patient for sciatica starting with x-rays of his back.

## 2019-09-30 ENCOUNTER — Other Ambulatory Visit: Payer: Self-pay

## 2019-09-30 LAB — CBC WITH DIFFERENTIAL/PLATELET
Absolute Monocytes: 552 cells/uL (ref 200–950)
Basophils Absolute: 45 cells/uL (ref 0–200)
Basophils Relative: 0.5 %
Eosinophils Absolute: 721 cells/uL — ABNORMAL HIGH (ref 15–500)
Eosinophils Relative: 8.1 %
HCT: 35.7 % — ABNORMAL LOW (ref 38.5–50.0)
Hemoglobin: 12.3 g/dL — ABNORMAL LOW (ref 13.2–17.1)
Lymphs Abs: 1477 cells/uL (ref 850–3900)
MCH: 28.8 pg (ref 27.0–33.0)
MCHC: 34.5 g/dL (ref 32.0–36.0)
MCV: 83.6 fL (ref 80.0–100.0)
MPV: 10.7 fL (ref 7.5–12.5)
Monocytes Relative: 6.2 %
Neutro Abs: 6105 cells/uL (ref 1500–7800)
Neutrophils Relative %: 68.6 %
Platelets: 190 10*3/uL (ref 140–400)
RBC: 4.27 10*6/uL (ref 4.20–5.80)
RDW: 14.9 % (ref 11.0–15.0)
Total Lymphocyte: 16.6 %
WBC: 8.9 10*3/uL (ref 3.8–10.8)

## 2019-09-30 LAB — HEMOGLOBIN A1C
Hgb A1c MFr Bld: 7.3 % of total Hgb — ABNORMAL HIGH (ref <5.7)
Mean Plasma Glucose: 163 (calc)
eAG (mmol/L): 9 (calc)

## 2019-09-30 LAB — COMPLETE METABOLIC PANEL WITH GFR
AG Ratio: 1.5 (calc) (ref 1.0–2.5)
ALT: 15 U/L (ref 9–46)
AST: 13 U/L (ref 10–35)
Albumin: 3.7 g/dL (ref 3.6–5.1)
Alkaline phosphatase (APISO): 94 U/L (ref 35–144)
BUN/Creatinine Ratio: 14 (calc) (ref 6–22)
BUN: 22 mg/dL (ref 7–25)
CO2: 30 mmol/L (ref 20–32)
Calcium: 8.8 mg/dL (ref 8.6–10.3)
Chloride: 106 mmol/L (ref 98–110)
Creat: 1.6 mg/dL — ABNORMAL HIGH (ref 0.70–1.25)
GFR, Est African American: 53 mL/min/{1.73_m2} — ABNORMAL LOW (ref >=60)
GFR, Est Non African American: 46 mL/min/{1.73_m2} — ABNORMAL LOW (ref >=60)
Globulin: 2.4 g/dL (calc) (ref 1.9–3.7)
Glucose, Bld: 225 mg/dL — ABNORMAL HIGH (ref 65–99)
Potassium: 3.9 mmol/L (ref 3.5–5.3)
Sodium: 143 mmol/L (ref 135–146)
Total Bilirubin: 0.4 mg/dL (ref 0.2–1.2)
Total Protein: 6.1 g/dL (ref 6.1–8.1)

## 2019-10-01 ENCOUNTER — Other Ambulatory Visit: Payer: Self-pay

## 2019-10-01 ENCOUNTER — Ambulatory Visit
Admission: RE | Admit: 2019-10-01 | Discharge: 2019-10-01 | Disposition: A | Discharge status: Home / Self Care | Payer: Medicare Other | Source: Ambulatory Visit | Attending: Family Medicine | Admitting: Family Medicine

## 2019-10-01 ENCOUNTER — Other Ambulatory Visit: Payer: Self-pay | Admitting: Family Medicine

## 2019-10-01 DIAGNOSIS — M25551 Pain in right hip: Secondary | ICD-10-CM

## 2019-10-01 DIAGNOSIS — M1611 Unilateral primary osteoarthritis, right hip: Secondary | ICD-10-CM | POA: Diagnosis not present

## 2019-10-03 ENCOUNTER — Other Ambulatory Visit: Payer: Self-pay

## 2019-10-03 ENCOUNTER — Other Ambulatory Visit: Payer: Self-pay | Admitting: Family Medicine

## 2019-10-03 ENCOUNTER — Telehealth: Payer: Self-pay

## 2019-10-03 ENCOUNTER — Telehealth: Payer: Self-pay | Admitting: *Deleted

## 2019-10-03 ENCOUNTER — Other Ambulatory Visit: Payer: Self-pay | Admitting: *Deleted

## 2019-10-03 DIAGNOSIS — M1610 Unilateral primary osteoarthritis, unspecified hip: Secondary | ICD-10-CM

## 2019-10-03 DIAGNOSIS — M25551 Pain in right hip: Secondary | ICD-10-CM

## 2019-10-03 MED ORDER — HYDROCODONE-ACETAMINOPHEN 5-325 MG PO TABS: 1.0000 | ORAL_TABLET | Freq: Four times a day (QID) | ORAL | 0 refills | Status: DC | PRN

## 2019-10-03 NOTE — Telephone Encounter (Signed)
Requested Prescriptions   Pending Prescriptions Disp Refills  . HYDROcodone-acetaminophen (NORCO) 5-325 MG tablet 20 tablet 0    Sig: Take 1 tablet by mouth every 6 (six) hours as needed for moderate pain.

## 2019-10-03 NOTE — Telephone Encounter (Signed)
Received call from patient wife, Manuela Schwartz.   Reports that patient continues to have severe pain in hip. Reports that he is requesting pain medication at this time.   MD please advise.

## 2019-10-03 NOTE — Telephone Encounter (Signed)
[facility] called to report that the norco that was sent in today does not meet the guidelines. [facility] stated this being pt's first time taking it, the quantity should not exceed 5 days worth and would like for you to change the quantity to 20 tabs. Please advise.

## 2019-10-03 NOTE — Telephone Encounter (Signed)
I will send in norco.

## 2019-10-03 NOTE — Telephone Encounter (Signed)
Call placed to patient and patient wife Eduardo Young made aware.

## 2019-10-03 NOTE — Telephone Encounter (Signed)
Please reduce to 20 tabs

## 2019-10-06 MED ORDER — HYDROCODONE-ACETAMINOPHEN 5-325 MG PO TABS: 1.0000 | ORAL_TABLET | Freq: Four times a day (QID) | ORAL | 0 refills | Status: DC | PRN

## 2019-10-13 ENCOUNTER — Telehealth: Payer: Self-pay | Admitting: Family Medicine

## 2019-10-13 DIAGNOSIS — R569 Unspecified convulsions: Secondary | ICD-10-CM

## 2019-10-13 DIAGNOSIS — N183 Chronic kidney disease, stage 3 unspecified: Secondary | ICD-10-CM

## 2019-10-13 DIAGNOSIS — I872 Venous insufficiency (chronic) (peripheral): Secondary | ICD-10-CM

## 2019-10-13 DIAGNOSIS — G8111 Spastic hemiplegia affecting right dominant side: Secondary | ICD-10-CM

## 2019-10-13 DIAGNOSIS — E7800 Pure hypercholesterolemia, unspecified: Secondary | ICD-10-CM

## 2019-10-13 DIAGNOSIS — R531 Weakness: Secondary | ICD-10-CM

## 2019-10-13 DIAGNOSIS — G459 Transient cerebral ischemic attack, unspecified: Secondary | ICD-10-CM

## 2019-10-13 DIAGNOSIS — E1142 Type 2 diabetes mellitus with diabetic polyneuropathy: Secondary | ICD-10-CM

## 2019-10-13 DIAGNOSIS — I10 Essential (primary) hypertension: Secondary | ICD-10-CM

## 2019-10-13 NOTE — Telephone Encounter (Signed)
Sharyn Lull called from [place] and wanted to know if we could put in a referral to have an CNA come into home to help with ADL's. His sister was doing all this but she has cancer and is not able to assist him like before. Will place referral.

## 2019-10-14 ENCOUNTER — Other Ambulatory Visit: Payer: Self-pay

## 2019-10-14 ENCOUNTER — Encounter: Payer: Self-pay | Admitting: Orthopaedic Surgery

## 2019-10-14 ENCOUNTER — Ambulatory Visit: Payer: Medicare Other | Admitting: Orthopaedic Surgery

## 2019-10-14 DIAGNOSIS — M25551 Pain in right hip: Secondary | ICD-10-CM

## 2019-10-14 MED ORDER — MELOXICAM 7.5 MG PO TABS: 7.5000 mg | ORAL_TABLET | Freq: Two times a day (BID) | ORAL | 2 refills | Status: DC | PRN

## 2019-10-14 NOTE — Progress Notes (Signed)
Office Visit Note   Patient: Eduardo Young           Date of Birth: 03/18/1956           MRN: [id number] Visit Date: 10/14/2019              Requested by: Susy Frizzle, MD [address] [place],  [place] [address] PCP: Susy Frizzle, MD   Assessment & Plan: Visit Diagnoses:  1. Pain in right hip     Plan: Impression is right hip moderate osteoarthritis.  We reviewed the x-rays together.  I did recommend a cortisone injection but he is worried about the potential cost therefore they will check with the insurance company on this.  In the meantime he will try short course of meloxicam.  They will make a follow-up appointment if they decide to go through with the hip injection. Total face to face encounter time was greater than 45 minutes and over half of this time was spent in counseling and/or coordination of care.   Follow-Up Instructions: Return if symptoms worsen or fail to improve.   Orders:  No orders of the defined types were placed in this encounter.  Meds ordered this encounter  Medications  . meloxicam (MOBIC) 7.5 MG tablet    Sig: Take 1 tablet (7.5 mg total) by mouth 2 (two) times daily as needed for pain.    Dispense:  30 tablet    Refill:  2      Procedures: No procedures performed   Clinical Data: No additional findings.   Subjective: Chief Complaint  Patient presents with  . Right Hip - Pain    Eduardo Young is a 64 year old gentleman who is accompanied by his sister today.  He is here to be evaluated for chronic right hip pain for the last 6 to 8 weeks.  He does very little walking with a walker and is mainly a wheelchair ambulator due to a stroke in 2008 that left him with right hemiparesis.  He endorses right hip and groin pain that radiates down the thigh and into the knee.  He denies any true radicular symptoms.  Denies any injuries.   Review of Systems  Constitutional: Negative.   All other systems reviewed and are  negative.    Objective: Vital Signs: There were no vitals taken for this visit.  Physical Exam Vitals and nursing note reviewed.  Constitutional:      Appearance: He is well-developed.  HENT:     Head: Normocephalic and atraumatic.  Eyes:     Pupils: Pupils are equal, round, and reactive to light.  Pulmonary:     Effort: Pulmonary effort is normal.  Abdominal:     Palpations: Abdomen is soft.  Musculoskeletal:        General: Normal range of motion.     Cervical back: Neck supple.  Skin:    General: Skin is warm.  Neurological:     Mental Status: He is alert and oriented to person, place, and time.  Psychiatric:        Behavior: Behavior normal.        Thought Content: Thought content normal.        Judgment: Judgment normal.     Ortho Exam Right hip exam is limited by participation in positioning in the wheelchair and hemiparesis.  He has no bony tenderness.  Positive FADIR. Specialty Comments:  No specialty comments available.  Imaging: No results found.   PMFS History:  Patient Active Problem List   Diagnosis Date Noted  . Pain in right hip 10/14/2019  . Rectal bleeding   . Segmental colitis with complication ([place])   . Adjustment disorder with mixed anxiety and depressed mood   . Benign essential HTN   . Type 2 diabetes mellitus with peripheral neuropathy (HCC)   . Stage 3 chronic kidney disease   . Spastic hemiparesis of right dominant side ([place]) 09/08/2016  . Weakness   . Myoclonus   . Seizures ([place])   . TIA (transient ischemic attack) 09/07/2016  . Chronic venous insufficiency   . History of cerebral infarction ([place]) 10/21/2010  . DM (diabetes mellitus) ([place]) 10/21/2010  . HLD (hyperlipidemia) 10/21/2010  . HTN (hypertension) 10/21/2010  . Sleep apnea 10/21/2010   Past Medical History:  Diagnosis Date  . Chronic venous insufficiency   . Diabetes mellitus   . GERD (gastroesophageal reflux disease)   . Gout   . Hemiparesis ([place])   .  Hyperlipidemia   . Hypertension   . Obesity   . Renal calculi   . Sleep apnea, obstructive   . Stroke [facility])    [id number]    Family History  Problem Relation Age of Onset  . Heart attack Mother   . Hypertension Mother   . Hyperlipidemia Mother   . Hypertension Father   . Hyperlipidemia Father   . CVA Father   . Heart attack Sister   . Lung cancer Brother   . Heart attack Brother   . Ovarian cancer Sister   . Heart attack Sister   . CVA Sister     Past Surgical History:  Procedure Laterality Date  . BIOPSY  09/06/2018   Procedure: BIOPSY;  Surgeon: Jerene Bears, MD;  Location: [facility];  Service: Gastroenterology;;  . COLONOSCOPY WITH PROPOFOL N/A 09/06/2018   Procedure: COLONOSCOPY WITH PROPOFOL;  Surgeon: Jerene Bears, MD;  Location: [facility];  Service: Gastroenterology;  Laterality: N/A;  . LITHOTRIPSY    . NASAL POLYP EXCISION     Social History   Occupational History  . Not on file  Tobacco Use  . Smoking status: Former Research scientist (life sciences)  . Smokeless tobacco: Never Used  Substance and Sexual Activity  . Alcohol use: Yes    Comment: Occasional  . Drug use: Yes    Types: Cocaine, Marijuana    Comment: Past Hx  . Sexual activity: Not on file

## 2019-10-20 ENCOUNTER — Ambulatory Visit (HOSPITAL_COMMUNITY): Payer: Medicare Other | Attending: Cardiology

## 2019-10-20 ENCOUNTER — Other Ambulatory Visit: Payer: Self-pay

## 2019-10-20 DIAGNOSIS — R0600 Dyspnea, unspecified: Secondary | ICD-10-CM | POA: Diagnosis not present

## 2019-10-20 DIAGNOSIS — R0609 Other forms of dyspnea: Secondary | ICD-10-CM

## 2019-11-03 ENCOUNTER — Other Ambulatory Visit: Payer: Self-pay | Admitting: Family Medicine

## 2019-11-06 DIAGNOSIS — H2513 Age-related nuclear cataract, bilateral: Secondary | ICD-10-CM | POA: Diagnosis not present

## 2019-11-06 DIAGNOSIS — H5203 Hypermetropia, bilateral: Secondary | ICD-10-CM | POA: Diagnosis not present

## 2019-11-06 DIAGNOSIS — H52223 Regular astigmatism, bilateral: Secondary | ICD-10-CM | POA: Diagnosis not present

## 2019-11-06 DIAGNOSIS — E119 Type 2 diabetes mellitus without complications: Secondary | ICD-10-CM | POA: Diagnosis not present

## 2019-11-06 DIAGNOSIS — H524 Presbyopia: Secondary | ICD-10-CM | POA: Diagnosis not present

## 2019-11-06 LAB — HM DIABETES EYE EXAM

## 2019-11-11 DIAGNOSIS — E1351 Other specified diabetes mellitus with diabetic peripheral angiopathy without gangrene: Secondary | ICD-10-CM | POA: Diagnosis not present

## 2019-11-11 DIAGNOSIS — L602 Onychogryphosis: Secondary | ICD-10-CM | POA: Diagnosis not present

## 2019-11-26 ENCOUNTER — Other Ambulatory Visit: Payer: Self-pay | Admitting: Family Medicine

## 2019-12-04 ENCOUNTER — Other Ambulatory Visit: Payer: Self-pay | Admitting: Family Medicine

## 2019-12-09 ENCOUNTER — Other Ambulatory Visit: Payer: Self-pay | Admitting: Family Medicine

## 2019-12-09 DIAGNOSIS — I10 Essential (primary) hypertension: Secondary | ICD-10-CM

## 2020-01-07 ENCOUNTER — Other Ambulatory Visit: Payer: Self-pay

## 2020-01-07 DIAGNOSIS — I10 Essential (primary) hypertension: Secondary | ICD-10-CM

## 2020-01-07 MED ORDER — HYDRALAZINE HCL 25 MG PO TABS: 50.0000 mg | ORAL_TABLET | Freq: Three times a day (TID) | ORAL | 5 refills | Status: DC

## 2020-01-07 MED ORDER — HYDROCHLOROTHIAZIDE 25 MG PO TABS
25.0000 mg | ORAL_TABLET | Freq: Every day | ORAL | 3 refills | Status: DC
Start: 2020-01-07 — End: 2021-02-07

## 2020-01-07 MED ORDER — ATORVASTATIN CALCIUM 40 MG PO TABS: 40.0000 mg | ORAL_TABLET | Freq: Every day | ORAL | 1 refills | Status: DC

## 2020-01-07 MED ORDER — LANTUS SOLOSTAR 100 UNIT/ML ~~LOC~~ SOPN: PEN_INJECTOR | SUBCUTANEOUS | 0 refills | Status: DC

## 2020-01-20 DIAGNOSIS — E1151 Type 2 diabetes mellitus with diabetic peripheral angiopathy without gangrene: Secondary | ICD-10-CM | POA: Diagnosis not present

## 2020-01-20 DIAGNOSIS — L602 Onychogryphosis: Secondary | ICD-10-CM | POA: Diagnosis not present

## 2020-01-25 ENCOUNTER — Other Ambulatory Visit: Payer: Self-pay | Admitting: Family Medicine

## 2020-02-02 ENCOUNTER — Telehealth: Payer: Self-pay | Admitting: Family Medicine

## 2020-02-02 NOTE — Progress Notes (Signed)
°  Chronic Care Management   Note  02/02/2020 Name: Eduardo Young MRN: [id number] DOB: 30-Nov-1955  Eduardo Young is a 64 y.o. year old male who is a primary care patient of Pickard, Cammie Mcgee, MD. I reached out to [employer] by phone today in response to a referral sent by Eduardo Young PCP, Susy Frizzle, MD.   Eduardo Young was given information about Chronic Care Management services today including:  1. CCM service includes personalized support from designated clinical staff supervised by his physician, including individualized plan of care and coordination with other care providers 2. 24/7 contact phone numbers for assistance for urgent and routine care needs. 3. Service will only be billed when office clinical staff spend 20 minutes or more in a month to coordinate care. 4. Only one practitioner may furnish and bill the service in a calendar month. 5. The patient may stop CCM services at any time (effective at the end of the month) by phone call to the office staff.   Patient agreed to services and verbal consent obtained.   Follow up plan:   [place]

## 2020-02-16 ENCOUNTER — Other Ambulatory Visit: Payer: Self-pay | Admitting: Family Medicine

## 2020-03-01 ENCOUNTER — Other Ambulatory Visit: Payer: Self-pay | Admitting: Family Medicine

## 2020-03-19 ENCOUNTER — Other Ambulatory Visit: Payer: Self-pay | Admitting: *Deleted

## 2020-03-19 DIAGNOSIS — R569 Unspecified convulsions: Secondary | ICD-10-CM

## 2020-03-19 DIAGNOSIS — I872 Venous insufficiency (chronic) (peripheral): Secondary | ICD-10-CM

## 2020-03-19 DIAGNOSIS — R531 Weakness: Secondary | ICD-10-CM

## 2020-03-19 DIAGNOSIS — Z794 Long term (current) use of insulin: Secondary | ICD-10-CM

## 2020-03-19 DIAGNOSIS — E1142 Type 2 diabetes mellitus with diabetic polyneuropathy: Secondary | ICD-10-CM

## 2020-03-19 DIAGNOSIS — N183 Chronic kidney disease, stage 3 unspecified: Secondary | ICD-10-CM

## 2020-03-19 DIAGNOSIS — E7800 Pure hypercholesterolemia, unspecified: Secondary | ICD-10-CM

## 2020-03-19 DIAGNOSIS — E119 Type 2 diabetes mellitus without complications: Secondary | ICD-10-CM

## 2020-03-19 DIAGNOSIS — I10 Essential (primary) hypertension: Secondary | ICD-10-CM

## 2020-03-19 DIAGNOSIS — G8111 Spastic hemiplegia affecting right dominant side: Secondary | ICD-10-CM

## 2020-03-19 DIAGNOSIS — G459 Transient cerebral ischemic attack, unspecified: Secondary | ICD-10-CM

## 2020-03-20 ENCOUNTER — Other Ambulatory Visit: Payer: Self-pay | Admitting: Family Medicine

## 2020-03-24 ENCOUNTER — Ambulatory Visit: Payer: Medicare Other | Admitting: Pharmacist

## 2020-03-24 ENCOUNTER — Other Ambulatory Visit: Payer: Self-pay

## 2020-03-24 DIAGNOSIS — E7800 Pure hypercholesterolemia, unspecified: Secondary | ICD-10-CM

## 2020-03-24 DIAGNOSIS — E1142 Type 2 diabetes mellitus with diabetic polyneuropathy: Secondary | ICD-10-CM

## 2020-03-24 DIAGNOSIS — I10 Essential (primary) hypertension: Secondary | ICD-10-CM

## 2020-03-24 MED ORDER — LANTUS SOLOSTAR 100 UNIT/ML ~~LOC~~ SOPN: PEN_INJECTOR | SUBCUTANEOUS | 0 refills | Status: DC

## 2020-03-24 MED ORDER — LANTUS SOLOSTAR 100 UNIT/ML ~~LOC~~ SOPN: PEN_INJECTOR | SUBCUTANEOUS | 1 refills | Status: DC

## 2020-03-24 NOTE — Chronic Care Management (AMB) (Addendum)
Chronic Care Management Pharmacy  Name: Eduardo Young  MRN: [id number] DOB: 1955-11-26   Chief Complaint/ HPI  Eduardo Young,  64 y.o. , male presents for their Initial CCM visit with the clinical pharmacist via telephone.  PCP : Susy Frizzle, MD  Their chronic conditions include: HTN, TIA hx, Type II DM, CKD, HLD.  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  Medications: Outpatient Encounter Medications as of 03/24/2020  Medication Sig   ACCU-CHEK FASTCLIX LANCETS MISC Check BS BID DX - E11.9   allopurinol (ZYLOPRIM) 100 MG tablet Take 1 tablet by mouth twice daily   amLODipine (NORVASC) 10 MG tablet Take 1 tablet by mouth once daily   aspirin 81 MG tablet Take 81 mg by mouth every evening.    atorvastatin (LIPITOR) 40 MG tablet Take 1 tablet (40 mg total) by mouth daily.   Blood Glucose Monitoring Suppl (ACCU-CHEK AVIVA PLUS) w/Device KIT Check BS BID DX - E11.9   cloNIDine (CATAPRES) 0.2 MG tablet Take 1 tablet by mouth twice daily   doxazosin (CARDURA) 4 MG tablet Take 1 tablet by mouth once daily   hydrALAZINE (APRESOLINE) 25 MG tablet Take 2 tablets (50 mg total) by mouth 3 (three) times daily.   hydrochlorothiazide (HYDRODIURIL) 25 MG tablet Take 1 tablet (25 mg total) by mouth daily.   HYDROcodone-acetaminophen (NORCO) 5-325 MG tablet Take 1 tablet by mouth every 6 (six) hours as needed for moderate pain.   insulin glargine (LANTUS SOLOSTAR) 100 UNIT/ML Solostar Pen INJECT 45 UNITS SUBCUTANEOUSLY ONCE DAILY   insulin lispro (HUMALOG) 100 UNIT/ML KwikPen INJECT 7 UNITS SUBCUTANEOUSLY ONCE DAILY   labetalol (NORMODYNE) 300 MG tablet Take 1 tablet by mouth twice daily   lisinopril (ZESTRIL) 20 MG tablet Take 1 tablet by mouth  twice daily   potassium chloride SA (K-DUR) 20 MEQ tablet Take 1 tablet (20 mEq total) by mouth daily.   bisacodyl (BISACODYL) 5 MG EC tablet Take 1 tablet (5 mg total) by mouth daily as needed for moderate constipation. (Patient not taking: Reported on 03/24/2020)   glucose blood (ACCU-CHEK AVIVA) test strip Check BS BID DX - E11.9 (Patient not taking: Reported on 03/24/2020)   Lancets Misc. (ACCU-CHEK FASTCLIX LANCET) KIT Check BS BID DX - E11.9   linaclotide (LINZESS) 145 MCG CAPS capsule Take 1 capsule (145 mcg total) by mouth daily before breakfast.   meloxicam (MOBIC) 7.5 MG tablet Take 1 tablet (7.5 mg total) by mouth 2 (two) times daily as needed for pain. (Patient not taking: Reported on 03/24/2020)   Multiple Vitamin (MULTIVITAMIN WITH MINERALS) TABS tablet Take 1 tablet by mouth daily.   polyethylene glycol powder (GLYCOLAX/MIRALAX) powder Take 17 g by mouth daily. (Patient not taking: Reported on 03/24/2020)   TRADJENTA 5 MG TABS tablet Take 1 tablet by mouth once daily (Patient not taking: Reported on 03/24/2020)   [DISCONTINUED] insulin glargine (LANTUS SOLOSTAR) 100 UNIT/ML Solostar Pen INJECT 45 UNITS SUBCUTANEOUSLY ONCE DAILY   [DISCONTINUED] LANTUS SOLOSTAR 100 UNIT/ML Solostar Pen INJECT 45 UNITS SUBCUTANEOUSLY ONCE DAILY   No facility-administered encounter medications on file as of 03/24/2020.     Current Diagnosis/Assessment:   Emergency planning/management officer Strain: Medium Risk   Difficulty of Paying Living Expenses:  Somewhat hard    Goals Addressed             This Visit's Progress    Pharmacy care plan:       CARE PLAN ENTRY (see longitudinal plan of care for additional care plan information)  Current Barriers:  Chronic Disease Management support, education, and care coordination needs related to Hypertension, Hyperlipidemia, and Diabetes   Hypertension BP Readings from Last 3 Encounters:  09/29/19 (!) 148/86  04/01/19 136/74  09/17/18 130/60   Pharmacist Clinical  Goal(s): Over the next 90 days, patient will work with PharmD and providers to maintain BP goal <130/80 Current regimen:  Clonidine 0.53m Hydralazine 262m2 tablets po tid labetolol 30032misinopril 1m60mlodipine 10mg70mazosin 4mg d69my HCTZ 25mg I13mventions: Reviewed office BP Recommended home BP monitoring Patient self care activities - Over the next 90 days, patient will: Check BP periodically, document, and provide at future appointments Ensure daily salt intake < 2300 mg/day  Hyperlipidemia Lab Results  Component Value Date/Time   LDLCALC 80 06/07/2017 12:14 PM   Pharmacist Clinical Goal(s): Over the next 90 days, patient will work with PharmD and providers to achieve LDL goal < 70 Current regimen:  Atorvastatin 40mg In73mentions: Recommended updated lipid panel Reviewed most recent lipid panel Patient self care activities - Over the next 90 days, patient will: Continue to focus on medication adherence by pill box Make appt with PCP for updated lab work  Diabetes Lab Results  Component Value Date/Time   HGBA1C 7.3 (H) 09/29/2019 04:03 PM   HGBA1C 6.6 (H) 04/01/2019 12:47 PM   Pharmacist Clinical Goal(s): Over the next 90 days, patient will work with PharmD and providers to achieve A1c goal <7% Current regimen:  Lantus 45 units daily Humalog 7 units once daily Interventions: Reviewed homoe monitoring frequency Recommended ReliOn meter from [facility] Discussed PAP for long acting insulin Patient self care activities - Over the next 90 days, patient will: Check blood sugar once daily, document, and provide at future appointments Contact provider with any episodes of hypoglycemia Gather documents needed for patient assistance application          Diabetes   Recent Relevant Labs: Lab Results  Component Value Date/Time   HGBA1C 7.3 (H) 09/29/2019 04:03 PM   HGBA1C 6.6 (H) 04/01/2019 12:47 PM   MICROALBUR 39.1 06/29/2015 10:43 AM   MICROALBUR 59.15  (H) 03/13/2014 11:24 AM     Checking BG: Never  Patient is currently uncontrolled on the following medications: Lantus 100u/ml 45 units daily Humalog 100u/ml 7 units once daily  Last diabetic Foot exam: No results found for: HMDIABEYEEXA  Last diabetic Eye exam: No results found for: HMDIABFOOTEX   No longer taking Tradjenta.  States compliance with above regime.  Is not currently checking blood sugars but reports no signs or symptoms of hypoglycemia.  Counseled on danger of not checking blood sugar while on insulin.  [facility] [facility] for most cost effective monitoring.   Patient reports copay of Lantus is $70 per month.  Discussed patient assistance for Basaglar as an option as they have an easier program to qualify for.    Plan  Continue current medications, recommend repeat A1c, patient gathering docs for patient assistance, if we can get approved I would recommend switching from Lantus to BasaglarStandardt reasons. Hypertension     Office blood pressures are  BP Readings from Last 3 Encounters:  09/29/19 (!) 148/86  04/01/19 136/74  09/17/18 130/60  Patient has failed these meds in the past: none noted  Patient checks BP at home infrequently  Patient home BP readings are ranging: no logs Patient is currently controlled on the following medications:  Clonidine 0.6m Hydralazine 235m2 tablets po tid labetolol 30029misinopril 22m22mlodipine 10mg33mazosin 4mg d40my HCTZ 25mg P68mnt checks at home using a wrist cuff occasionally.  Reports right leg swelling which has been ongoing.  Is elevating his leg at nightime only.  Does not elevated during the day, recommended elevated throughout the day as much as possible.  BP fairly well controlled in office.    Plan  Continue current medications   Hyperlipidemia   LDL goal < 70  Lipid Panel     Component Value Date/Time   CHOL 133 06/07/2017 1214   TRIG 222 (H) 06/07/2017  1214   HDL 22 (L) 06/07/2017 1214   LDLCALC 80 06/07/2017 1214    Hepatic Function Latest Ref Rng & Units 09/29/2019 04/01/2019 08/26/2018  Total Protein 6.1 - 8.1 g/dL 6.1 6.1 6.1  Albumin 3.6 - 5.1 g/dL - - -  AST 10 - 35 U/L _0 ALT 9 - 46 U/L _1 Alk Phosphatase 40 - 115 U/L - - -  Total Bilirubin 0.2 - 1.2 mg/dL 0.4 0.4 0.4     The 10-year ASCVD risk score (Goff DMikey Bussing, et al., 2013) is: 30.3%   Values used to calculate the score:     Age: 35 year88    Sex: Male     Is Non-Hispanic African American: No     Diabetic: Yes     Tobacco smoker: No     Systolic Blood Pressure: 148 mmH350   Is BP treated: Yes     HDL Cholesterol: 22 mg/dL     Total Cholesterol: 133 mg/dL   Patient has failed these meds in past: none noted Patient is currently controlled on the following medications:  Atorvastatin 40mg  N30mL on file since 2018.  LDL above goal at that time.  Tolerating medication, no myalgias reported.    Plan  Continue current medications, recommend updated lipid panel. CKD   Kidney Function Lab Results  Component Value Date/Time   CREATININE 1.60 (H) 09/29/2019 04:03 PM   CREATININE 1.66 (H) 04/24/2019 02:05 PM   GFRNONAA 46 (L) 09/29/2019 04:03 PM   GFRAA 53 (L) 09/29/2019 04:03 PM   K 3.9 09/29/2019 04:03 PM   K 3.8 04/24/2019 02:05 PM   Stage 3a, borderline 3b  Patient has failed these meds in past: none noted  Evaluated medication profile for safety based on current renal function.  Avoidance of NSAIDs   Plan  Continue current medications Hx of TIA   Patient has failed these meds in past: none noted Patient is currently controlled on the following medications:  ASA 81mg  No25mormal bleeding or bruising, adherent with medication.  Denies chest pain, dizziness.  Plan  Continue current medications Vaccines   Reviewed and discussed patient's vaccination history.    Immunization History  Administered Date(s) Administered   Influenza  Whole 04/25/2010   Influenza,inj,Quad PF,6+ Mos 07/08/2015, 08/22/2016, 06/07/2017, 09/17/2018, 04/01/2019   Pneumococcal Polysaccharide-23 09/17/2018     Plan  Recommended patient receive Shingles, COVID-19, vaccine in pharmacy or office.  Medication Management   Miscellaneous medications:  Allopurinol 100mg Pota27mm chloride 22meq OTC'22mZyrtec 10mg Patien64mrrently uses [facility] pharProduct/process development scientistreports using pill box method to  organize medications and promote adherence. Patient denies missed doses of medication.   Beverly Milch, PharmD Clinical Pharmacist [place] [phone] I have collaborated with the care management provider regarding care management and care coordination activities outlined in this encounter and have reviewed this encounter including documentation in the note and care plan. I am certifying that I agree with the content of this note and encounter as supervising physician.

## 2020-03-24 NOTE — Patient Instructions (Addendum)
Visit Information Thank you for meeting with me today!  I look forward to working with you to help you meet all of your healthcare goals and answer any questions you may have.  Feel free to contact me anytime!  Goals Addressed            This Visit's Progress   . Pharmacy care plan:       CARE PLAN ENTRY (see longitudinal plan of care for additional care plan information)  Current Barriers:  . Chronic Disease Management support, education, and care coordination needs related to Hypertension, Hyperlipidemia, and Diabetes   Hypertension BP Readings from Last 3 Encounters:  09/29/19 (!) 148/86  04/01/19 136/74  09/17/18 130/60   . Pharmacist Clinical Goal(s): o Over the next 90 days, patient will work with PharmD and providers to maintain BP goal <130/80 . Current regimen:   Clonidine 0.60m  Hydralazine 239m2 tablets po tid  labetolol 3002mLisinopril 35m48mmlodipine 10mg26mxazosin 4mg d96my  HCTZ 25mg .49merventions: o Reviewed office BP o Recommended home BP monitoring . Patient self care activities - Over the next 90 days, patient will: o Check BP periodically, document, and provide at future appointments o Ensure daily salt intake < 2300 mg/day  Hyperlipidemia Lab Results  Component Value Date/Time   LDLCALC 80 06/07/2017 12:14 PM   . Pharmacist Clinical Goal(s): o Over the next 90 days, patient will work with PharmD and providers to achieve LDL goal < 70 . Current regimen:  o Atorvastatin 40mg . 42mrventions: o Recommended updated lipid panel o Reviewed most recent lipid panel . Patient self care activities - Over the next 90 days, patient will: o Continue to focus on medication adherence by pill box o Make appt with PCP for updated lab work  Diabetes Lab Results  Component Value Date/Time   HGBA1C 7.3 (H) 09/29/2019 04:03 PM   HGBA1C 6.6 (H) 04/01/2019 12:47 PM   . Pharmacist Clinical Goal(s): o Over the next 90 days, patient will work  with PharmD and providers to achieve A1c goal <7% . Current regimen:  o Lantus 45 units daily o Humalog 7 units once daily . Interventions: o Reviewed homoe monitoring frequency o Recommended ReliOn meter from [facility] o Discussed PAP for long acting insulin . Patient self care activities - Over the next 90 days, patient will: o Check blood sugar once daily, document, and provide at future appointments o Contact provider with any episodes of hypoglycemia o Gather documents needed for patient assistance application         Eduardo Young waLivecchien information about Chronic Care Management services today including:  1. CCM service includes personalized support from designated clinical staff supervised by his physician, including individualized plan of care and coordination with other care providers 2. 24/7 contact phone numbers for assistance for urgent and routine care needs. 3. Standard insurance, coinsurance, copays and deductibles apply for chronic care management only during months in which we provide at least 20 minutes of these services. Most insurances cover these services at 100%, however patients may be responsible for any copay, coinsurance and/or deductible if applicable. This service may help you avoid the need for more expensive face-to-face services. 4. Only one practitioner may furnish and bill the service in a calendar month. 5. The patient may stop CCM services at any time (effective at the end of the month) by phone call to the office staff.  Patient agreed to services and verbal consent obtained.  The patient verbalized understanding of instructions provided today and agreed to receive a mailed copy of patient instruction and/or educational materials. Telephone follow up appointment with pharmacy team member scheduled for: 3 months Beverly Milch, PharmD Clinical Pharmacist [place] [phone]   Carbohydrate Counting for Diabetes Mellitus,  Adult  Carbohydrate counting is a method of keeping track of how many carbohydrates you eat. Eating carbohydrates naturally increases the amount of sugar (glucose) in the blood. Counting how many carbohydrates you eat helps keep your blood glucose within normal limits, which helps you manage your diabetes (diabetes mellitus). It is important to know how many carbohydrates you can safely have in each meal. This is different for every person. A diet and nutrition specialist (registered dietitian) can help you make a meal plan and calculate how many carbohydrates you should have at each meal and snack. Carbohydrates are found in the following foods:  Grains, such as breads and cereals.  Dried beans and soy products.  Starchy vegetables, such as potatoes, peas, and corn.  Fruit and fruit juices.  Milk and yogurt.  Sweets and snack foods, such as cake, cookies, candy, chips, and soft drinks. How do I count carbohydrates? There are two ways to count carbohydrates in food. You can use either of the methods or a combination of both. Reading "Nutrition Facts" on packaged food The "Nutrition Facts" list is included on the labels of almost all packaged foods and beverages in the [place] It includes:  The serving size.  Information about nutrients in each serving, including the grams (g) of carbohydrate per serving. To use the "Nutrition Facts":  Decide how many servings you will have.  Multiply the number of servings by the number of carbohydrates per serving.  The resulting number is the total amount of carbohydrates that you will be having. Learning standard serving sizes of other foods When you eat carbohydrate foods that are not packaged or do not include "Nutrition Facts" on the label, you need to measure the servings in order to count the amount of carbohydrates:  Measure the foods that you will eat with a food scale or measuring cup, if needed.  Decide how many standard-size servings  you will eat.  Multiply the number of servings by 15. Most carbohydrate-rich foods have about 15 g of carbohydrates per serving. ? For example, if you eat 8 oz (170 g) of strawberries, you will have eaten 2 servings and 30 g of carbohydrates (2 servings x 15 g = 30 g).  For foods that have more than one food mixed, such as soups and casseroles, you must count the carbohydrates in each food that is included. The following list contains standard serving sizes of common carbohydrate-rich foods. Each of these servings has about 15 g of carbohydrates:   hamburger bun or  English muffin.   oz (15 mL) syrup.   oz (14 g) jelly.  1 slice of bread.  1 six-inch tortilla.  3 oz (85 g) cooked rice or pasta.  4 oz (113 g) cooked dried beans.  4 oz (113 g) starchy vegetable, such as peas, corn, or potatoes.  4 oz (113 g) hot cereal.  4 oz (113 g) mashed potatoes or  of a large baked potato.  4 oz (113 g) canned or frozen fruit.  4 oz (120 mL) fruit juice.  4-6 crackers.  6 chicken nuggets.  6 oz (170 g) unsweetened dry cereal.  6 oz (170 g) plain fat-free yogurt or yogurt sweetened  with artificial sweeteners.  8 oz (240 mL) milk.  8 oz (170 g) fresh fruit or one small piece of fruit.  24 oz (680 g) popped popcorn. Example of carbohydrate counting Sample meal  3 oz (85 g) chicken breast.  6 oz (170 g) brown rice.  4 oz (113 g) corn.  8 oz (240 mL) milk.  8 oz (170 g) strawberries with sugar-free whipped topping. Carbohydrate calculation 1. Identify the foods that contain carbohydrates: ? Rice. ? Corn. ? Milk. ? Strawberries. 2. Calculate how many servings you have of each food: ? 2 servings rice. ? 1 serving corn. ? 1 serving milk. ? 1 serving strawberries. 3. Multiply each number of servings by 15 g: ? 2 servings rice x 15 g = 30 g. ? 1 serving corn x 15 g = 15 g. ? 1 serving milk x 15 g = 15 g. ? 1 serving strawberries x 15 g = 15 g. 4. Add together all  of the amounts to find the total grams of carbohydrates eaten: ? 30 g + 15 g + 15 g + 15 g = 75 g of carbohydrates total. Summary  Carbohydrate counting is a method of keeping track of how many carbohydrates you eat.  Eating carbohydrates naturally increases the amount of sugar (glucose) in the blood.  Counting how many carbohydrates you eat helps keep your blood glucose within normal limits, which helps you manage your diabetes.  A diet and nutrition specialist (registered dietitian) can help you make a meal plan and calculate how many carbohydrates you should have at each meal and snack. This information is not intended to replace advice given to you by your health care provider. Make sure you discuss any questions you have with your health care provider. Document Revised: 02/01/2017 Document Reviewed: 12/22/2015 Elsevier Patient Education  [place].

## 2020-04-01 ENCOUNTER — Ambulatory Visit: Payer: Self-pay | Admitting: Pharmacist

## 2020-04-01 NOTE — Progress Notes (Addendum)
Chronic Care Management   Follow Up Note   04/01/2020 Name: Eduardo Young MRN: [id number] DOB: 1956-03-31  Referred by: Susy Frizzle, MD Reason for referral : No chief complaint on file.   Eduardo Young is a 64 y.o. year old male who is a primary care patient of Pickard, Cammie Mcgee, MD. The CCM team was consulted for assistance with chronic disease management and care coordination needs.    Review of patient status, including review of consultants reports, relevant laboratory and other test results, and collaboration with appropriate care team members and the patient's provider was performed as part of comprehensive patient evaluation and provision of chronic care management services.    SDOH (Social Determinants of Health) assessments performed: No See Care Plan activities for detailed interventions related to [facility])      Outpatient Encounter Medications as of 04/01/2020  Medication Sig   ACCU-CHEK FASTCLIX LANCETS MISC Check BS BID DX - E11.9   allopurinol (ZYLOPRIM) 100 MG tablet Take 1 tablet by mouth twice daily   amLODipine (NORVASC) 10 MG tablet Take 1 tablet by mouth once daily   aspirin 81 MG tablet Take 81 mg by mouth every evening.    atorvastatin (LIPITOR) 40 MG tablet Take 1 tablet (40 mg total) by mouth daily.   bisacodyl (BISACODYL) 5 MG EC tablet Take 1 tablet (5 mg total) by mouth daily as needed for moderate constipation. (Patient not taking: Reported on 03/24/2020)   Blood Glucose Monitoring Suppl (ACCU-CHEK AVIVA PLUS) w/Device KIT Check BS BID DX - E11.9   cloNIDine (CATAPRES) 0.2 MG tablet Take 1 tablet by mouth twice daily   doxazosin (CARDURA) 4 MG tablet Take 1 tablet by mouth once daily   glucose blood (ACCU-CHEK AVIVA) test strip Check BS BID DX - E11.9 (Patient not taking: Reported on 03/24/2020)   hydrALAZINE (APRESOLINE) 25 MG tablet Take 2 tablets (50 mg total) by mouth 3 (three) times daily.   hydrochlorothiazide (HYDRODIURIL) 25 MG tablet Take 1 tablet  (25 mg total) by mouth daily.   HYDROcodone-acetaminophen (NORCO) 5-325 MG tablet Take 1 tablet by mouth every 6 (six) hours as needed for moderate pain.   insulin lispro (HUMALOG) 100 UNIT/ML KwikPen INJECT 7 UNITS SUBCUTANEOUSLY ONCE DAILY   labetalol (NORMODYNE) 300 MG tablet Take 1 tablet by mouth twice daily   Lancets Misc. (ACCU-CHEK FASTCLIX LANCET) KIT Check BS BID DX - E11.9   LANTUS SOLOSTAR 100 UNIT/ML Solostar Pen INJECT 45 UNITS SUBCUTANEOUSLY ONCE DAILY   linaclotide (LINZESS) 145 MCG CAPS capsule Take 1 capsule (145 mcg total) by mouth daily before breakfast.   lisinopril (ZESTRIL) 20 MG tablet Take 1 tablet by mouth twice daily   meloxicam (MOBIC) 7.5 MG tablet Take 1 tablet (7.5 mg total) by mouth 2 (two) times daily as needed for pain. (Patient not taking: Reported on 03/24/2020)   Multiple Vitamin (MULTIVITAMIN WITH MINERALS) TABS tablet Take 1 tablet by mouth daily.   polyethylene glycol powder (GLYCOLAX/MIRALAX) powder Take 17 g by mouth daily. (Patient not taking: Reported on 03/24/2020)   potassium chloride SA (K-DUR) 20 MEQ tablet Take 1 tablet (20 mEq total) by mouth daily.   TRADJENTA 5 MG TABS tablet Take 1 tablet by mouth once daily (Patient not taking: Reported on 03/24/2020)   No facility-administered encounter medications on file as of 04/01/2020.     Objective:  Patient Assistance application for Basaglar 100u/mL Kwikpen as substitute for Lantus due to increasing copay.  Goals Addressed   None  Application completed for Basaglar PAP, pending signature from PCP.  Patient has provided all necessary paperwork including proof of income.  Upon signature application will be faxed to program.   Plan:   CCM team will follow up within 7 days to check status of application and notify patient of status.   Beverly Milch, PharmD Clinical Pharmacist [place] [phone]  I have collaborated with the care management provider regarding care  management and care coordination activities outlined in this encounter and have reviewed this encounter including documentation in the note and care plan. I am certifying that I agree with the content of this note and encounter as supervising physician.

## 2020-04-07 DIAGNOSIS — I872 Venous insufficiency (chronic) (peripheral): Secondary | ICD-10-CM | POA: Diagnosis not present

## 2020-04-07 DIAGNOSIS — I739 Peripheral vascular disease, unspecified: Secondary | ICD-10-CM | POA: Diagnosis not present

## 2020-04-07 DIAGNOSIS — L602 Onychogryphosis: Secondary | ICD-10-CM | POA: Diagnosis not present

## 2020-04-07 DIAGNOSIS — E1351 Other specified diabetes mellitus with diabetic peripheral angiopathy without gangrene: Secondary | ICD-10-CM | POA: Diagnosis not present

## 2020-04-07 DIAGNOSIS — M205X2 Other deformities of toe(s) (acquired), left foot: Secondary | ICD-10-CM | POA: Diagnosis not present

## 2020-04-14 ENCOUNTER — Ambulatory Visit: Payer: Self-pay | Admitting: Pharmacist

## 2020-04-14 NOTE — Chronic Care Management (AMB) (Addendum)
Chronic Care Management   Follow Up Note   04/14/2020 Name: Eduardo Young MRN: [id number] DOB: 05/27/1956  Referred by: Susy Frizzle, MD Reason for referral : No chief complaint on file.   Eduardo Young is a 64 y.o. year old male who is a primary care patient of Pickard, Cammie Mcgee, MD. The CCM team was consulted for assistance with chronic disease management and care coordination needs.    Review of patient status, including review of consultants reports, relevant laboratory and other test results, and collaboration with appropriate care team members and the patient's provider was performed as part of comprehensive patient evaluation and provision of chronic care management services.    SDOH (Social Determinants of Health) assessments performed: No See Care Plan activities for detailed interventions related to [facility])      Outpatient Encounter Medications as of 04/14/2020  Medication Sig   ACCU-CHEK FASTCLIX LANCETS MISC Check BS BID DX - E11.9   allopurinol (ZYLOPRIM) 100 MG tablet Take 1 tablet by mouth twice daily   amLODipine (NORVASC) 10 MG tablet Take 1 tablet by mouth once daily   aspirin 81 MG tablet Take 81 mg by mouth every evening.    atorvastatin (LIPITOR) 40 MG tablet Take 1 tablet (40 mg total) by mouth daily.   bisacodyl (BISACODYL) 5 MG EC tablet Take 1 tablet (5 mg total) by mouth daily as needed for moderate constipation. (Patient not taking: Reported on 03/24/2020)   Blood Glucose Monitoring Suppl (ACCU-CHEK AVIVA PLUS) w/Device KIT Check BS BID DX - E11.9   cloNIDine (CATAPRES) 0.2 MG tablet Take 1 tablet by mouth twice daily   doxazosin (CARDURA) 4 MG tablet Take 1 tablet by mouth once daily   glucose blood (ACCU-CHEK AVIVA) test strip Check BS BID DX - E11.9 (Patient not taking: Reported on 03/24/2020)   hydrALAZINE (APRESOLINE) 25 MG tablet Take 2 tablets (50 mg total) by mouth 3 (three) times daily.   hydrochlorothiazide (HYDRODIURIL) 25 MG tablet Take 1 tablet  (25 mg total) by mouth daily.   HYDROcodone-acetaminophen (NORCO) 5-325 MG tablet Take 1 tablet by mouth every 6 (six) hours as needed for moderate pain.   insulin lispro (HUMALOG) 100 UNIT/ML KwikPen INJECT 7 UNITS SUBCUTANEOUSLY ONCE DAILY   labetalol (NORMODYNE) 300 MG tablet Take 1 tablet by mouth twice daily   Lancets Misc. (ACCU-CHEK FASTCLIX LANCET) KIT Check BS BID DX - E11.9   LANTUS SOLOSTAR 100 UNIT/ML Solostar Pen INJECT 45 UNITS SUBCUTANEOUSLY ONCE DAILY   linaclotide (LINZESS) 145 MCG CAPS capsule Take 1 capsule (145 mcg total) by mouth daily before breakfast.   lisinopril (ZESTRIL) 20 MG tablet Take 1 tablet by mouth twice daily   meloxicam (MOBIC) 7.5 MG tablet Take 1 tablet (7.5 mg total) by mouth 2 (two) times daily as needed for pain. (Patient not taking: Reported on 03/24/2020)   Multiple Vitamin (MULTIVITAMIN WITH MINERALS) TABS tablet Take 1 tablet by mouth daily.   polyethylene glycol powder (GLYCOLAX/MIRALAX) powder Take 17 g by mouth daily. (Patient not taking: Reported on 03/24/2020)   potassium chloride SA (K-DUR) 20 MEQ tablet Take 1 tablet (20 mEq total) by mouth daily.   TRADJENTA 5 MG TABS tablet Take 1 tablet by mouth once daily (Patient not taking: Reported on 03/24/2020)   No facility-administered encounter medications on file as of 04/14/2020.     Objective:  Patient assistance follow-up for Basaglar to replace Lantus.  Goals Addressed   None   Patient is approved through 07/23/20  for Basaglar PAP program.  Will have to reapply at that point for next year.  This will replace his Lantus and will be continued at the same dose.  Plan:   The patient has been provided with contact information for the care management team and has been advised to call with any health related questions or concerns.    Beverly Milch, PharmD Clinical Pharmacist [place] [phone]  I have collaborated with the care management provider regarding care  management and care coordination activities outlined in this encounter and have reviewed this encounter including documentation in the note and care plan. I am certifying that I agree with the content of this note and encounter as supervising physician.

## 2020-05-18 ENCOUNTER — Ambulatory Visit: Payer: Self-pay | Admitting: Pharmacist

## 2020-05-18 NOTE — Chronic Care Management (AMB) (Addendum)
Chronic Care Management   Follow Up Note   05/18/2020 Name: Eduardo Young MRN: [id number] DOB: June 28, 1956  Referred by: Susy Frizzle, MD Reason for referral : No chief complaint on file.   Eduardo Young is a 64 y.o. year old male who is a primary care patient of Pickard, Cammie Mcgee, MD. The CCM team was consulted for assistance with chronic disease management and care coordination needs.    Review of patient status, including review of consultants reports, relevant laboratory and other test results, and collaboration with appropriate care team members and the patient's provider was performed as part of comprehensive patient evaluation and provision of chronic care management services.    SDOH (Social Determinants of Health) assessments performed: No See Care Plan activities for detailed interventions related to [facility])      Outpatient Encounter Medications as of 05/18/2020  Medication Sig   ACCU-CHEK FASTCLIX LANCETS MISC Check BS BID DX - E11.9   allopurinol (ZYLOPRIM) 100 MG tablet Take 1 tablet by mouth twice daily   amLODipine (NORVASC) 10 MG tablet Take 1 tablet by mouth once daily   aspirin 81 MG tablet Take 81 mg by mouth every evening.    atorvastatin (LIPITOR) 40 MG tablet Take 1 tablet (40 mg total) by mouth daily.   bisacodyl (BISACODYL) 5 MG EC tablet Take 1 tablet (5 mg total) by mouth daily as needed for moderate constipation. (Patient not taking: Reported on 03/24/2020)   Blood Glucose Monitoring Suppl (ACCU-CHEK AVIVA PLUS) w/Device KIT Check BS BID DX - E11.9   cloNIDine (CATAPRES) 0.2 MG tablet Take 1 tablet by mouth twice daily   doxazosin (CARDURA) 4 MG tablet Take 1 tablet by mouth once daily   glucose blood (ACCU-CHEK AVIVA) test strip Check BS BID DX - E11.9 (Patient not taking: Reported on 03/24/2020)   hydrALAZINE (APRESOLINE) 25 MG tablet Take 2 tablets (50 mg total) by mouth 3 (three) times daily.   hydrochlorothiazide (HYDRODIURIL) 25 MG tablet Take 1  tablet (25 mg total) by mouth daily.   HYDROcodone-acetaminophen (NORCO) 5-325 MG tablet Take 1 tablet by mouth every 6 (six) hours as needed for moderate pain.   Insulin Glargine (BASAGLAR KWIKPEN) 100 UNIT/ML Inject 45 Units into the skin daily.   insulin lispro (HUMALOG) 100 UNIT/ML KwikPen INJECT 7 UNITS SUBCUTANEOUSLY ONCE DAILY   labetalol (NORMODYNE) 300 MG tablet Take 1 tablet by mouth twice daily   Lancets Misc. (ACCU-CHEK FASTCLIX LANCET) KIT Check BS BID DX - E11.9   LANTUS SOLOSTAR 100 UNIT/ML Solostar Pen INJECT 45 UNITS SUBCUTANEOUSLY ONCE DAILY (Patient not taking: Reported on 04/14/2020)   linaclotide (LINZESS) 145 MCG CAPS capsule Take 1 capsule (145 mcg total) by mouth daily before breakfast.   lisinopril (ZESTRIL) 20 MG tablet Take 1 tablet by mouth twice daily   meloxicam (MOBIC) 7.5 MG tablet Take 1 tablet (7.5 mg total) by mouth 2 (two) times daily as needed for pain. (Patient not taking: Reported on 03/24/2020)   Multiple Vitamin (MULTIVITAMIN WITH MINERALS) TABS tablet Take 1 tablet by mouth daily.   polyethylene glycol powder (GLYCOLAX/MIRALAX) powder Take 17 g by mouth daily. (Patient not taking: Reported on 03/24/2020)   potassium chloride SA (K-DUR) 20 MEQ tablet Take 1 tablet (20 mEq total) by mouth daily.   TRADJENTA 5 MG TABS tablet Take 1 tablet by mouth once daily (Patient not taking: Reported on 03/24/2020)   No facility-administered encounter medications on file as of 05/18/2020.     Objective:  .  Renewal PAP application for Engineer, agricultural through  [employer]. Goals Addressed   None     Completed renewal application for 0017 for [employer] program on medication: Basaglar.  Pending signature from PCP and will be faxed in to program after 05/24/2020 so that patient does not have a lapse in medication during 2022. Plan:   The patient has been provided with contact information for the care management team and has been advised to call with any health related questions or  concerns.    Beverly Milch, PharmD Clinical Pharmacist [place] [phone]  I have collaborated with the care management provider regarding care management and care coordination activities outlined in this encounter and have reviewed this encounter including documentation in the note and care plan. I am certifying that I agree with the content of this note and encounter as supervising physician.

## 2020-06-11 ENCOUNTER — Ambulatory Visit (INDEPENDENT_AMBULATORY_CARE_PROVIDER_SITE_OTHER): Payer: Medicare Other | Admitting: Family Medicine

## 2020-06-11 ENCOUNTER — Other Ambulatory Visit: Payer: Self-pay | Admitting: Family Medicine

## 2020-06-11 ENCOUNTER — Other Ambulatory Visit: Payer: Self-pay

## 2020-06-11 ENCOUNTER — Ambulatory Visit
Admission: RE | Admit: 2020-06-11 | Discharge: 2020-06-11 | Disposition: A | Discharge status: Home / Self Care | Payer: Medicare Other | Source: Ambulatory Visit | Attending: Family Medicine | Admitting: Family Medicine

## 2020-06-11 VITALS — BP 150/80 | HR 70 | Temp 97.5°F | Ht 75.0 in

## 2020-06-11 DIAGNOSIS — M4722 Other spondylosis with radiculopathy, cervical region: Secondary | ICD-10-CM | POA: Diagnosis not present

## 2020-06-11 DIAGNOSIS — M5412 Radiculopathy, cervical region: Secondary | ICD-10-CM

## 2020-06-11 DIAGNOSIS — M50122 Cervical disc disorder at C5-C6 level with radiculopathy: Secondary | ICD-10-CM | POA: Diagnosis not present

## 2020-06-11 MED ORDER — PREDNISONE 20 MG PO TABS: ORAL_TABLET | ORAL | 0 refills | Status: DC

## 2020-06-11 NOTE — Progress Notes (Signed)
Subjective:    Patient ID: Eduardo Young, male    DOB: Jun 18, 1956, 64 y.o.   MRN: [id number]  Patient states for the last 5 to 6 weeks has had pain originating from the left side of his neck.  It comes out of the side of his neck roughly at the level of C4 and radiates down the posterior aspect of his left shoulder into the left deltoid all the way down his left arm to just below his left elbow is an intense burning pain.  Is made worse whenever he turns his head left or right.  I can push on the transverse processes of the cervical spine and elicit pain in that area.  He also has tight muscle spasms in that area as well.  He denies any falls or injury.  He does have crepitus with range of motion in the neck.  He denies any rash in that area.  He denies any weakness in the arm.  He denies any tingling or numbness or weakness in his left hand Past Medical History:  Diagnosis Date  . Chronic venous insufficiency   . Diabetes mellitus   . GERD (gastroesophageal reflux disease)   . Gout   . Hemiparesis ([employer])   . Hyperlipidemia   . Hypertension   . Obesity   . Renal calculi   . Sleep apnea, obstructive   . Stroke [facility])    [id number]   Past Surgical History:  Procedure Laterality Date  . BIOPSY  09/06/2018   Procedure: BIOPSY;  Surgeon: Jerene Bears, MD;  Location: [facility];  Service: Gastroenterology;;  . COLONOSCOPY WITH PROPOFOL N/A 09/06/2018   Procedure: COLONOSCOPY WITH PROPOFOL;  Surgeon: Jerene Bears, MD;  Location: [facility];  Service: Gastroenterology;  Laterality: N/A;  . LITHOTRIPSY    . NASAL POLYP EXCISION     Current Outpatient Medications on File Prior to Visit  Medication Sig Dispense Refill  . ACCU-CHEK FASTCLIX LANCETS MISC Check BS BID DX - E11.9 200 each 3  . allopurinol (ZYLOPRIM) 100 MG tablet Take 1 tablet by mouth twice daily 180 tablet 1  . aspirin 81 MG tablet Take 81 mg by mouth every evening.     Marland Kitchen atorvastatin (LIPITOR) 40 MG tablet Take 1 tablet (40  mg total) by mouth daily. 90 tablet 1  . Blood Glucose Monitoring Suppl (ACCU-CHEK AVIVA PLUS) w/Device KIT Check BS BID DX - E11.9 1 kit 1  . doxazosin (CARDURA) 4 MG tablet Take 1 tablet by mouth once daily 90 tablet 1  . hydrALAZINE (APRESOLINE) 25 MG tablet Take 2 tablets (50 mg total) by mouth 3 (three) times daily. 180 tablet 5  . hydrochlorothiazide (HYDRODIURIL) 25 MG tablet Take 1 tablet (25 mg total) by mouth daily. 90 tablet 3  . HYDROcodone-acetaminophen (NORCO) 5-325 MG tablet Take 1 tablet by mouth every 6 (six) hours as needed for moderate pain. 20 tablet 0  . Insulin Glargine (BASAGLAR KWIKPEN) 100 UNIT/ML Inject 45 Units into the skin daily.    . insulin lispro (HUMALOG) 100 UNIT/ML KwikPen INJECT 7 UNITS SUBCUTANEOUSLY ONCE DAILY 15 mL 0  . labetalol (NORMODYNE) 300 MG tablet Take 1 tablet by mouth twice daily 180 tablet 1  . Lancets Misc. (ACCU-CHEK FASTCLIX LANCET) KIT Check BS BID DX - E11.9 1 kit 1  . linaclotide (LINZESS) 145 MCG CAPS capsule Take 1 capsule (145 mcg total) by mouth daily before breakfast. 30 capsule 3  . lisinopril (ZESTRIL) 20 MG tablet Take  1 tablet by mouth twice daily 180 tablet 1  . Multiple Vitamin (MULTIVITAMIN WITH MINERALS) TABS tablet Take 1 tablet by mouth daily.    . potassium chloride SA (K-DUR) 20 MEQ tablet Take 1 tablet (20 mEq total) by mouth daily. 30 tablet 3  . bisacodyl (BISACODYL) 5 MG EC tablet Take 1 tablet (5 mg total) by mouth daily as needed for moderate constipation. (Patient not taking: Reported on 03/24/2020) 30 tablet 0  . glucose blood (ACCU-CHEK AVIVA) test strip Check BS BID DX - E11.9 (Patient not taking: Reported on 03/24/2020) 150 each 3  . LANTUS SOLOSTAR 100 UNIT/ML Solostar Pen INJECT 45 UNITS SUBCUTANEOUSLY ONCE DAILY (Patient not taking: Reported on 04/14/2020) 15 mL 0  . meloxicam (MOBIC) 7.5 MG tablet Take 1 tablet (7.5 mg total) by mouth 2 (two) times daily as needed for pain. (Patient not taking: Reported on 03/24/2020)  30 tablet 2  . polyethylene glycol powder (GLYCOLAX/MIRALAX) powder Take 17 g by mouth daily. (Patient not taking: Reported on 03/24/2020) 510 g 11  . TRADJENTA 5 MG TABS tablet Take 1 tablet by mouth once daily (Patient not taking: Reported on 03/24/2020) 90 tablet 0   No current facility-administered medications on file prior to visit.   No Known Allergies Social History   Socioeconomic History  . Marital status: Single    Spouse name: Not on file  . Number of children: Not on file  . Years of education: Not on file  . Highest education level: Not on file  Occupational History  . Not on file  Tobacco Use  . Smoking status: Former Research scientist (life sciences)  . Smokeless tobacco: Never Used  Substance and Sexual Activity  . Alcohol use: Yes    Comment: Occasional  . Drug use: Yes    Types: Cocaine, Marijuana    Comment: Past Hx  . Sexual activity: Not on file  Other Topics Concern  . Not on file  Social History Narrative  . Not on file   Social Determinants of Health   Financial Resource Strain: Medium Risk  . Difficulty of Paying Living Expenses: Somewhat hard  Food Insecurity:   . Worried About Charity fundraiser in the Last Year: Not on file  . Ran Out of Food in the Last Year: Not on file  Transportation Needs:   . Lack of Transportation (Medical): Not on file  . Lack of Transportation (Non-Medical): Not on file  Physical Activity:   . Days of Exercise per Week: Not on file  . Minutes of Exercise per Session: Not on file  Stress:   . Feeling of Stress : Not on file  Social Connections:   . Frequency of Communication with Friends and Family: Not on file  . Frequency of Social Gatherings with Friends and Family: Not on file  . Attends Religious Services: Not on file  . Active Member of Clubs or Organizations: Not on file  . Attends Archivist Meetings: Not on file  . Marital Status: Not on file  Intimate Partner Violence:   . Fear of Current or Ex-Partner: Not on file  .  Emotionally Abused: Not on file  . Physically Abused: Not on file  . Sexually Abused: Not on file      Review of Systems  All other systems reviewed and are negative.      Objective:   Physical Exam Vitals reviewed.  Constitutional:      General: He is not in acute distress.  Appearance: He is well-developed. He is not diaphoretic.  Neck:   Cardiovascular:     Rate and Rhythm: Normal rate and regular rhythm.     Heart sounds: Normal heart sounds. No murmur heard.   Pulmonary:     Effort: Pulmonary effort is normal. No respiratory distress.     Breath sounds: Normal breath sounds. No wheezing or rales.  Chest:     Chest wall: No tenderness.  Musculoskeletal:       Arms:     Cervical back: Crepitus present. No rigidity. Pain with movement and muscular tenderness present. No spinous process tenderness. Normal range of motion.       Cervical radiculopathy - Plan: DG Cervical Spine Complete, predniSONE (DELTASONE) 20 MG tablet  I suspect that the patient may have cervical radiculopathy.  Begin prednisone taper pack for what I suspect may be a bulging disc in his neck and proceed with an x-ray of the cervical spine to evaluate further.

## 2020-06-15 ENCOUNTER — Other Ambulatory Visit: Payer: Self-pay

## 2020-06-15 DIAGNOSIS — M5030 Other cervical disc degeneration, unspecified cervical region: Secondary | ICD-10-CM

## 2020-06-15 DIAGNOSIS — I872 Venous insufficiency (chronic) (peripheral): Secondary | ICD-10-CM

## 2020-06-15 DIAGNOSIS — M1610 Unilateral primary osteoarthritis, unspecified hip: Secondary | ICD-10-CM

## 2020-06-22 ENCOUNTER — Ambulatory Visit (INDEPENDENT_AMBULATORY_CARE_PROVIDER_SITE_OTHER): Payer: Medicare Other | Admitting: Family Medicine

## 2020-06-22 ENCOUNTER — Other Ambulatory Visit: Payer: Self-pay

## 2020-06-22 ENCOUNTER — Encounter: Payer: Self-pay | Admitting: Family Medicine

## 2020-06-22 DIAGNOSIS — M542 Cervicalgia: Secondary | ICD-10-CM | POA: Diagnosis not present

## 2020-06-22 DIAGNOSIS — M5412 Radiculopathy, cervical region: Secondary | ICD-10-CM

## 2020-06-22 MED ORDER — TRAMADOL HCL 50 MG PO TABS
50.0000 mg | ORAL_TABLET | Freq: Four times a day (QID) | ORAL | 0 refills | Status: DC | PRN
Start: 2020-06-22 — End: 2020-07-16

## 2020-06-22 MED ORDER — BACLOFEN 10 MG PO TABS
5.0000 mg | ORAL_TABLET | Freq: Three times a day (TID) | ORAL | 3 refills | Status: AC | PRN
Start: 2020-06-22 — End: ?

## 2020-06-22 NOTE — Progress Notes (Signed)
Office Visit Note   Patient: Eduardo Young           Date of Birth: Aug 02, 1955           MRN: [id number] Visit Date: 06/22/2020 Requested by: Susy Frizzle, MD [address] [place],  [place] [address] PCP: Susy Frizzle, MD  Subjective: Chief Complaint  Patient presents with  . Neck - Pain    Pain in left side of neck and down the left arm to the elbow,  2 months. NKI. 3 nights ago, he could not bend the 3rd & 4th fingers for about 15 minutes. Pain shoots down to the left shoulder blade.    HPI: [name] with neck and left arm pain.  About 2 months ago he went out to dinner with some friends, when finished one of his friends patted him on the back harder than anticipated.  He thinks it resulted in his current pain.  Later that night he started feeling pain with radiation into the left arm.  He is originally right-hand dominant, but after his stroke he no longer has use of the right arm.  He relies entirely on his left arm.  He went to his PCP who ordered x-rays which show significant degenerative disc disease at C5-6.  He was given a prednisone Dosepak which helped, but the pain came back as soon as he finished it.  No previous problems with his neck.  He has diabetes which has been suboptimally controlled.  He does not monitor his blood sugars.                ROS:   All other systems were reviewed and are negative.  Objective: Vital Signs: There were no vitals taken for this visit.  Physical Exam:  General:  Alert and oriented, in no acute distress. Pulm:  Breathing unlabored. Psy:  Normal mood, congruent affect. Skin: No rash Neck: He has tenderness to the left of midline in the C5-6 area.  He has tenderness of the left trapezius muscle.  Upper extremity strength and reflexes are still normal on the left.  Imaging: No results found.  Assessment & Plan: 1.  Neck pain with left arm radiculopathy -Discussed options with him and elected to try physical therapy at  [place] location.  Tramadol and baclofen as needed. -MRI followed by epidural injection if symptoms persist.     Procedures: No procedures performed  No notes on file     PMFS History: Patient Active Problem List   Diagnosis Date Noted  . Pain in right hip 10/14/2019  . Rectal bleeding   . Segmental colitis with complication ([place])   . Adjustment disorder with mixed anxiety and depressed mood   . Benign essential HTN   . Type 2 diabetes mellitus with peripheral neuropathy (HCC)   . Stage 3 chronic kidney disease ([place])   . Spastic hemiparesis of right dominant side ([place]) 09/08/2016  . Weakness   . Myoclonus   . Seizures ([place])   . TIA (transient ischemic attack) 09/07/2016  . Chronic venous insufficiency   . History of cerebral infarction ([place]) 10/21/2010  . DM (diabetes mellitus) ([place]) 10/21/2010  . HLD (hyperlipidemia) 10/21/2010  . HTN (hypertension) 10/21/2010  . Sleep apnea 10/21/2010   Past Medical History:  Diagnosis Date  . Chronic venous insufficiency   . Diabetes mellitus   . GERD (gastroesophageal reflux disease)   . Gout   . Hemiparesis ([place])   . Hyperlipidemia   .  Hypertension   . Obesity   . Renal calculi   . Sleep apnea, obstructive   . Stroke [facility])    [id number]    Family History  Problem Relation Age of Onset  . Heart attack Mother   . Hypertension Mother   . Hyperlipidemia Mother   . Hypertension Father   . Hyperlipidemia Father   . CVA Father   . Heart attack Sister   . Lung cancer Brother   . Heart attack Brother   . Ovarian cancer Sister   . Heart attack Sister   . CVA Sister     Past Surgical History:  Procedure Laterality Date  . BIOPSY  09/06/2018   Procedure: BIOPSY;  Surgeon: Jerene Bears, MD;  Location: [facility];  Service: Gastroenterology;;  . COLONOSCOPY WITH PROPOFOL N/A 09/06/2018   Procedure: COLONOSCOPY WITH PROPOFOL;  Surgeon: Jerene Bears, MD;  Location: [facility];  Service: Gastroenterology;  Laterality:  N/A;  . LITHOTRIPSY    . NASAL POLYP EXCISION     Social History   Occupational History  . Not on file  Tobacco Use  . Smoking status: Former Research scientist (life sciences)  . Smokeless tobacco: Never Used  Substance and Sexual Activity  . Alcohol use: Yes    Comment: Occasional  . Drug use: Yes    Types: Cocaine, Marijuana    Comment: Past Hx  . Sexual activity: Not on file

## 2020-07-02 ENCOUNTER — Other Ambulatory Visit: Payer: Medicare Other

## 2020-07-02 ENCOUNTER — Ambulatory Visit
Admission: RE | Admit: 2020-07-02 | Discharge: 2020-07-02 | Disposition: A | Discharge status: Home / Self Care | Payer: Medicare Other | Source: Ambulatory Visit | Attending: Family Medicine | Admitting: Family Medicine

## 2020-07-02 DIAGNOSIS — I872 Venous insufficiency (chronic) (peripheral): Secondary | ICD-10-CM

## 2020-07-02 DIAGNOSIS — I6523 Occlusion and stenosis of bilateral carotid arteries: Secondary | ICD-10-CM | POA: Diagnosis not present

## 2020-07-05 ENCOUNTER — Other Ambulatory Visit: Payer: Self-pay

## 2020-07-05 ENCOUNTER — Encounter: Payer: Self-pay | Admitting: Physical Therapy

## 2020-07-05 ENCOUNTER — Ambulatory Visit: Payer: Medicare Other | Attending: Family Medicine | Admitting: Physical Therapy

## 2020-07-05 ENCOUNTER — Telehealth: Payer: Self-pay

## 2020-07-05 DIAGNOSIS — R293 Abnormal posture: Secondary | ICD-10-CM

## 2020-07-05 DIAGNOSIS — M542 Cervicalgia: Secondary | ICD-10-CM | POA: Diagnosis not present

## 2020-07-05 DIAGNOSIS — R252 Cramp and spasm: Secondary | ICD-10-CM | POA: Diagnosis not present

## 2020-07-05 NOTE — Patient Instructions (Signed)
Access Code: [email or web address] URL: [email or web address] Date: 07/05/2020 Prepared by: Amador Cunas  Exercises Seated Cervical Rotation AROM - 1 x daily - 7 x weekly - 3 sets - 10 reps Seated Cervical Extension AROM - 1 x daily - 7 x weekly - 3 sets - 10 reps Seated Cervical Retraction - 1 x daily - 7 x weekly - 3 sets - 10 reps - 3 sec hold Seated Gentle Upper Trapezius Stretch - 1 x daily - 7 x weekly - 3 sets - 2 reps - 20-30 sec hold Correct Seated Posture in Wheelchair - 1 x daily - 7 x weekly Seated Scapular Retraction - 1 x daily - 7 x weekly - 3 sets - 10 reps

## 2020-07-05 NOTE — Therapy (Signed)
[place]. [place], [place], [address] Phone: [phone]   Fax:  [phone]  Physical Therapy Evaluation  Patient Details  Name: Eduardo Young MRN: [id number] Date of Birth: 04-01-1956 Referring Provider (PT): Hilts   Encounter Date: 07/05/2020   PT End of Session - 07/05/20 1615    Visit Number 1    Date for PT Re-Evaluation 09/05/20    PT Start Time 8299    PT Stop Time 1607    PT Time Calculation (min) 37 min    Activity Tolerance Patient tolerated treatment well    Behavior During Therapy [facility] for tasks assessed/performed           Past Medical History:  Diagnosis Date  . Chronic venous insufficiency   . Diabetes mellitus   . GERD (gastroesophageal reflux disease)   . Gout   . Hemiparesis ([place])   . Hyperlipidemia   . Hypertension   . Obesity   . Renal calculi   . Sleep apnea, obstructive   . Stroke [facility])    [id number]    Past Surgical History:  Procedure Laterality Date  . BIOPSY  09/06/2018   Procedure: BIOPSY;  Surgeon: Jerene Bears, MD;  Location: [facility];  Service: Gastroenterology;;  . COLONOSCOPY WITH PROPOFOL N/A 09/06/2018   Procedure: COLONOSCOPY WITH PROPOFOL;  Surgeon: Jerene Bears, MD;  Location: [facility];  Service: Gastroenterology;  Laterality: N/A;  . LITHOTRIPSY    . NASAL POLYP EXCISION      There were no vitals filed for this visit.    Subjective Assessment - 07/05/20 1531    Subjective Pt reports L sided neck pain with radiating pain that started about 3 months ago. Believes it may have begun with friend patting him too hard on the back. Woke up that night with pain radiating all the way to his hand and and first two fingers locked up for ~15 minutes. Since then has had pain in L UT and radiating down to upper arm. Denies N/T in L UE. Denies changes in vision and new HA.    Pertinent History prior CVA affecting R side, HTN    Limitations House hold  activities;Lifting    Diagnostic tests xrays cervical    Currently in Pain? Yes    Pain Score 7     Pain Location Neck    Pain Orientation Left    Pain Descriptors / Indicators Aching;Dull    Pain Type Acute pain    Pain Radiating Towards L UE    Pain Onset More than a month ago    Pain Frequency Constant    Aggravating Factors  turning head, reaching overhead, lifting    Pain Relieving Factors baclofen, avoiding aggravating movements              [facility] PT Assessment - 07/05/20 0001      Assessment   Medical Diagnosis cervicalgia    Referring Provider (PT) Hilts    Hand Dominance Left   does not have use of RUE     Precautions   Precautions None      Restrictions   Weight Bearing Restrictions No      Balance Screen   Has the patient fallen in the past 6 months No    Has the patient had a decrease in activity level because of a fear of falling?  No    Is the patient reluctant to leave their home because of a fear  of falling?  No      Home Environment   Additional Comments no stairs at home      Prior Function   Level of Independence Independent with household mobility with device   uses WC and hemi walker   Vocation On disability      Posture/Postural Control   Posture/Postural Control Postural limitations    Posture Comments pt with FHP, rounded shoulders, and excessive thoracic kyphosis seated in WC      ROM / Strength   AROM / PROM / Strength AROM;Strength      AROM   AROM Assessment Site Cervical;Shoulder    Right/Left Shoulder Left    Left Shoulder Flexion --   [facility]   Left Shoulder ABduction 100 Degrees    Left Shoulder Internal Rotation --   to L5   Left Shoulder External Rotation --   [facility]   Cervical Flexion 50    Cervical Extension 35    Cervical - Right Side Bend 30    Cervical - Left Side Bend 20    Cervical - Right Rotation 75    Cervical - Left Rotation 65      Strength   Overall Strength Comments LUE 5/5      Palpation   Palpation comment  tender to palpation L UT and L cervical paraspinals                      Objective measurements completed on examination: See above findings.       [place] Adult PT Treatment/Exercise - 07/05/20 0001      Exercises   Exercises Neck      Neck Exercises: Seated   Neck Retraction 10 reps;3 secs    Cervical Rotation Both;10 reps    Postural Training postural education/alignment in WC    Other Seated Exercise scap retraction x10 on LUE      Neck Exercises: Stretches   Upper Trapezius Stretch Right;Left;1 rep;30 seconds                  PT Education - 07/05/20 1605    Education Details Pt educated on POC and HEP    Person(s) Educated Patient;Other (comment)   sister   Methods Explanation;Demonstration;Handout    Comprehension Verbalized understanding;Returned demonstration            PT Short Term Goals - 07/05/20 1627      PT SHORT TERM GOAL #1   Title Patient demonstrates understanding of initial HEP.    Time 2    Period Weeks    Status New    Target Date 07/19/20             PT Long Term Goals - 07/05/20 1627      PT LONG TERM GOAL #1   Title Patient and sister demonstrate understanding of ongoing HEP.    Time 6    Period Weeks    Status New    Target Date 08/16/20      PT LONG TERM GOAL #2   Title Pt will report resolution of radiating pain into L UE    Time 6    Period Weeks    Status New    Target Date 08/16/20      PT LONG TERM GOAL #3   Title Pt will report 50% reduction in L cervical pain    Time 6    Period Weeks    Status New    Target Date 08/16/20  PT LONG TERM GOAL #4   Title Pt will demo cervical ROM WFL    Time 6    Period Weeks    Status New    Target Date 08/16/20      PT LONG TERM GOAL #5   Title Pt will report able to complete ADLs with </= 1/10 cervical pain    Time 6    Period Weeks    Status New    Target Date 08/16/20                  Plan - 07/05/20 1616    Clinical Impression  Statement Pt presents to clinic with reports of L sided cervical pain and radiating pain present for ~3 mos, no clear MOI. Pt has hx of CVA affecting R side; reports no use of RUE/RLE. Household and community mobility primarily with WC; uses hemi walker for transfers and ambulation to bathroom. Pt demos significant FHP, rounded shoulders, thoracic kyphosis, and leaning to R side when seated in WC. Tenderness to palpation L UT/cervical paraspinals, deficits in L shoulder abduction and functional IR, and deficits/pain with cervical ROM. Pt relies on LUE for all ADLs. Pt would benefit from skilled PT to address the above impairments.    Personal Factors and Comorbidities Other;Comorbidity 2   primarily in WC   Comorbidities HTN, hx of CVA    Examination-Activity Limitations Reach Overhead;Lift    Examination-Participation Restrictions Cleaning;Community Activity;Interpersonal Relationship    Stability/Clinical Decision Making Evolving/Moderate complexity    Clinical Decision Making Low    Rehab Potential Good    PT Frequency 2x / week    PT Duration 6 weeks    PT Treatment/Interventions ADLs/Self Care Home Management;Electrical Stimulation;Iontophoresis 43m/ml Dexamethasone;Moist Heat;Neuromuscular re-education;Therapeutic exercise;Therapeutic activities;Functional mobility training;Patient/family education;Manual techniques;Passive range of motion;Dry needling    PT Next Visit Plan postural ex's, cervical ex's, manual/modalities as indicated    PT Home Exercise Plan scap retraction, cervical AROM, UT stretch, postural ex's in WC    Consulted and Agree with Plan of Care Patient           Patient will benefit from skilled therapeutic intervention in order to improve the following deficits and impairments:  Decreased range of motion,Increased muscle spasms,Impaired UE functional use,Decreased activity tolerance,Pain,Hypomobility,Postural dysfunction  Visit Diagnosis: Neck pain  Cramp and  spasm  Abnormal posture     Problem List Patient Active Problem List   Diagnosis Date Noted  . Pain in right hip 10/14/2019  . Rectal bleeding   . Segmental colitis with complication ([place]   . Adjustment disorder with mixed anxiety and depressed mood   . Benign essential HTN   . Type 2 diabetes mellitus with peripheral neuropathy (HCC)   . Stage 3 chronic kidney disease ([place]   . Spastic hemiparesis of right dominant side ([place] 09/08/2016  . Weakness   . Myoclonus   . Seizures ([place]   . TIA (transient ischemic attack) 09/07/2016  . Chronic venous insufficiency   . History of cerebral infarction ([place] 10/21/2010  . DM (diabetes mellitus) ([place] 10/21/2010  . HLD (hyperlipidemia) 10/21/2010  . HTN (hypertension) 10/21/2010  . Sleep apnea 10/21/2010   AAmador Cunas PT, DPT ADonald ProseSugg 07/05/2020, 4:29 PM  [place] [address]Phone: [phone]  Fax:  [phone] Name: Eduardo MARTENSONMRN: [id number] of Birth: 905-23-1957

## 2020-07-05 NOTE — Telephone Encounter (Signed)
Spoke with pt, verbalized understanding on imaging results

## 2020-07-08 ENCOUNTER — Other Ambulatory Visit: Payer: Self-pay

## 2020-07-08 ENCOUNTER — Ambulatory Visit: Payer: Medicare Other | Admitting: Physical Therapy

## 2020-07-08 DIAGNOSIS — M542 Cervicalgia: Secondary | ICD-10-CM

## 2020-07-08 DIAGNOSIS — R252 Cramp and spasm: Secondary | ICD-10-CM | POA: Diagnosis not present

## 2020-07-08 DIAGNOSIS — R293 Abnormal posture: Secondary | ICD-10-CM | POA: Diagnosis not present

## 2020-07-08 NOTE — Therapy (Addendum)
[place] Eduardo Young, [place], [address] Phone: [phone]   Fax:  [phone]  Physical Therapy Treatment  Patient Details  Name: Eduardo Young MRN: [id number] Date of Birth: [birth year] Referring Provider (PT): Hilts   Encounter Date: 07/08/2020   PT End of Session - 07/08/20 1643    Visit Number 2    Date for PT Re-Evaluation 09/05/20    PT Start Time 4163    PT Stop Time 1700    PT Time Calculation (min) 45 min           Past Medical History:  Diagnosis Date  . Chronic venous insufficiency   . Diabetes mellitus   . GERD (gastroesophageal reflux disease)   . Gout   . Hemiparesis ([place])   . Hyperlipidemia   . Hypertension   . Obesity   . Renal calculi   . Sleep apnea, obstructive   . Stroke [facility])    [id number]    Past Surgical History:  Procedure Laterality Date  . BIOPSY  09/06/2018   Procedure: BIOPSY;  Surgeon: Jerene Bears, MD;  Location: [facility];  Service: Gastroenterology;;  . COLONOSCOPY WITH PROPOFOL N/A 09/06/2018   Procedure: COLONOSCOPY WITH PROPOFOL;  Surgeon: Jerene Bears, MD;  Location: [facility];  Service: Gastroenterology;  Laterality: N/A;  . LITHOTRIPSY    . NASAL POLYP EXCISION      There were no vitals filed for this visit.   Subjective Assessment - 07/08/20 1615    Subjective about the same. pt arrives in w/c with very poor posture. Denies N/T    Currently in Pain? Yes    Pain Score 7     Pain Location Neck                             [facility] Adult PT Treatment/Exercise - 07/08/20 0001      Neck Exercises: Seated   Other Seated Exercise red tband Left UE row,shld ext and ER 2 sets 10 with red tband      Modalities   Modalities Electrical Stimulation      Electrical Stimulation   Electrical Stimulation Location left UT and rhom    Electrical Stimulation Action IFC    Electrical Stimulation Parameters seated    Electrical Stimulation  Goals Pain      Manual Therapy   Manual Therapy Soft tissue mobilization    Soft tissue mobilization deep STM to Left UT and rhom            Trigger Point Dry Needling - 07/08/20 0001    Consent Given? Yes    Education Handout Provided Yes    Muscles Treated Head and Neck Upper trapezius    Upper Trapezius Response Twitch reponse elicited;Palpable increased muscle length                PT Education - 07/08/20 1642    Education Details reviewed HEP from eval    Person(s) Educated Patient    Methods Explanation;Demonstration    Comprehension Verbal cues required;Tactile cues required            PT Short Term Goals - 07/05/20 1627      PT SHORT TERM GOAL #1   Title Patient demonstrates understanding of initial HEP.    Time 2    Period Weeks    Status New    Target Date 07/19/20  PT Long Term Goals - 07/05/20 1627      PT LONG TERM GOAL #1   Title Patient and sister demonstrate understanding of ongoing HEP.    Time 6    Period Weeks    Status New    Target Date 08/16/20      PT LONG TERM GOAL #2   Title Pt will report resolution of radiating pain into L UE    Time 6    Period Weeks    Status New    Target Date 08/16/20      PT LONG TERM GOAL #3   Title Pt will report 50% reduction in L cervical pain    Time 6    Period Weeks    Status New    Target Date 08/16/20      PT LONG TERM GOAL #4   Title Pt will demo cervical ROM WFL    Time 6    Period Weeks    Status New    Target Date 08/16/20      PT LONG TERM GOAL #5   Title Pt will report able to complete ADLs with </= 1/10 cervical pain    Time 6    Period Weeks    Status New    Target Date 08/16/20                 Plan - 07/08/20 1644    Clinical Impression Statement reviewed HEP ,verb and tactile cuing needed for correct tech and posture. postural cuing throughout. decreased ability to self correct and maintain posture which could be affecting left UE pain. TP in  left UT and rhom with STW so added DN and modalities. Educ on trying to work on better posture    PT Treatment/Interventions ADLs/Self Care Home Management;Electrical Stimulation;Iontophoresis 20m/ml Dexamethasone;Moist Heat;Neuromuscular re-education;Therapeutic exercise;Therapeutic activities;Functional mobility training;Patient/family education;Manual techniques;Passive range of motion;Dry needling    PT Next Visit Plan assess and progress           Patient will benefit from skilled therapeutic intervention in order to improve the following deficits and impairments:  Decreased range of motion,Increased muscle spasms,Impaired UE functional use,Decreased activity tolerance,Pain,Hypomobility,Postural dysfunction  Visit Diagnosis: Neck pain  Cramp and spasm  Abnormal posture     Problem List Patient Active Problem List   Diagnosis Date Noted  . Pain in right hip 10/14/2019  . Rectal bleeding   . Segmental colitis with complication ([place]   . Adjustment disorder with mixed anxiety and depressed mood   . Benign essential HTN   . Type 2 diabetes mellitus with peripheral neuropathy (HCC)   . Stage 3 chronic kidney disease ([place]   . Spastic hemiparesis of right dominant side ([place] 09/08/2016  . Weakness   . Myoclonus   . Seizures ([place]   . TIA (transient ischemic attack) 09/07/2016  . Chronic venous insufficiency   . History of cerebral infarction ([place] 10/21/2010  . DM (diabetes mellitus) ([place] 10/21/2010  . HLD (hyperlipidemia) 10/21/2010  . HTN (hypertension) 10/21/2010  . Sleep apnea 10/21/2010    ASumner BoastPTA 07/08/2020, 4:50 PM  [place] [address]Phone: [phone]  Fax:  [phone] Name: Eduardo Young: [id number] of Birth: 923-Feb-1957

## 2020-07-08 NOTE — Patient Instructions (Signed)

## 2020-07-12 DIAGNOSIS — L602 Onychogryphosis: Secondary | ICD-10-CM | POA: Diagnosis not present

## 2020-07-12 DIAGNOSIS — I739 Peripheral vascular disease, unspecified: Secondary | ICD-10-CM | POA: Diagnosis not present

## 2020-07-13 ENCOUNTER — Inpatient Hospital Stay (HOSPITAL_COMMUNITY)
Admission: EM | Admit: 2020-07-13 | Discharge: 2020-07-16 | DRG: 556 | Disposition: A | Discharge status: Skilled Nursing Facility | Payer: Medicare Other | Attending: Internal Medicine | Admitting: Internal Medicine

## 2020-07-13 ENCOUNTER — Other Ambulatory Visit: Payer: Self-pay

## 2020-07-13 ENCOUNTER — Telehealth: Payer: Self-pay | Admitting: Family Medicine

## 2020-07-13 ENCOUNTER — Ambulatory Visit (INDEPENDENT_AMBULATORY_CARE_PROVIDER_SITE_OTHER): Payer: Medicare Other | Admitting: Family Medicine

## 2020-07-13 ENCOUNTER — Ambulatory Visit: Payer: Medicare Other | Admitting: Physical Therapy

## 2020-07-13 ENCOUNTER — Emergency Department (HOSPITAL_COMMUNITY): Payer: Medicare Other

## 2020-07-13 ENCOUNTER — Encounter (HOSPITAL_COMMUNITY): Payer: Self-pay

## 2020-07-13 VITALS — BP 120/76 | HR 73 | Temp 97.5°F | Ht 75.0 in

## 2020-07-13 DIAGNOSIS — E1165 Type 2 diabetes mellitus with hyperglycemia: Secondary | ICD-10-CM | POA: Diagnosis not present

## 2020-07-13 DIAGNOSIS — E782 Mixed hyperlipidemia: Secondary | ICD-10-CM | POA: Diagnosis not present

## 2020-07-13 DIAGNOSIS — I69151 Hemiplegia and hemiparesis following nontraumatic intracerebral hemorrhage affecting right dominant side: Secondary | ICD-10-CM

## 2020-07-13 DIAGNOSIS — Y92009 Unspecified place in unspecified non-institutional (private) residence as the place of occurrence of the external cause: Secondary | ICD-10-CM

## 2020-07-13 DIAGNOSIS — M79604 Pain in right leg: Secondary | ICD-10-CM

## 2020-07-13 DIAGNOSIS — R519 Headache, unspecified: Secondary | ICD-10-CM | POA: Diagnosis not present

## 2020-07-13 DIAGNOSIS — N1831 Chronic kidney disease, stage 3a: Secondary | ICD-10-CM | POA: Diagnosis present

## 2020-07-13 DIAGNOSIS — M25461 Effusion, right knee: Secondary | ICD-10-CM | POA: Diagnosis not present

## 2020-07-13 DIAGNOSIS — E1169 Type 2 diabetes mellitus with other specified complication: Secondary | ICD-10-CM | POA: Diagnosis not present

## 2020-07-13 DIAGNOSIS — E876 Hypokalemia: Secondary | ICD-10-CM | POA: Diagnosis present

## 2020-07-13 DIAGNOSIS — M109 Gout, unspecified: Secondary | ICD-10-CM | POA: Diagnosis present

## 2020-07-13 DIAGNOSIS — G9389 Other specified disorders of brain: Secondary | ICD-10-CM | POA: Diagnosis not present

## 2020-07-13 DIAGNOSIS — K219 Gastro-esophageal reflux disease without esophagitis: Secondary | ICD-10-CM | POA: Diagnosis present

## 2020-07-13 DIAGNOSIS — Z794 Long term (current) use of insulin: Secondary | ICD-10-CM | POA: Diagnosis not present

## 2020-07-13 DIAGNOSIS — Z8249 Family history of ischemic heart disease and other diseases of the circulatory system: Secondary | ICD-10-CM

## 2020-07-13 DIAGNOSIS — M7989 Other specified soft tissue disorders: Secondary | ICD-10-CM | POA: Diagnosis not present

## 2020-07-13 DIAGNOSIS — M25561 Pain in right knee: Secondary | ICD-10-CM | POA: Diagnosis present

## 2020-07-13 DIAGNOSIS — I10 Essential (primary) hypertension: Secondary | ICD-10-CM | POA: Diagnosis present

## 2020-07-13 DIAGNOSIS — G4733 Obstructive sleep apnea (adult) (pediatric): Secondary | ICD-10-CM | POA: Diagnosis present

## 2020-07-13 DIAGNOSIS — I69951 Hemiplegia and hemiparesis following unspecified cerebrovascular disease affecting right dominant side: Secondary | ICD-10-CM | POA: Diagnosis not present

## 2020-07-13 DIAGNOSIS — Z79899 Other long term (current) drug therapy: Secondary | ICD-10-CM

## 2020-07-13 DIAGNOSIS — E1122 Type 2 diabetes mellitus with diabetic chronic kidney disease: Secondary | ICD-10-CM | POA: Diagnosis present

## 2020-07-13 DIAGNOSIS — E1142 Type 2 diabetes mellitus with diabetic polyneuropathy: Secondary | ICD-10-CM | POA: Diagnosis present

## 2020-07-13 DIAGNOSIS — S8991XA Unspecified injury of right lower leg, initial encounter: Secondary | ICD-10-CM | POA: Diagnosis not present

## 2020-07-13 DIAGNOSIS — I129 Hypertensive chronic kidney disease with stage 1 through stage 4 chronic kidney disease, or unspecified chronic kidney disease: Secondary | ICD-10-CM | POA: Diagnosis not present

## 2020-07-13 DIAGNOSIS — Z87442 Personal history of urinary calculi: Secondary | ICD-10-CM | POA: Diagnosis not present

## 2020-07-13 DIAGNOSIS — J189 Pneumonia, unspecified organism: Secondary | ICD-10-CM | POA: Diagnosis present

## 2020-07-13 DIAGNOSIS — Z801 Family history of malignant neoplasm of trachea, bronchus and lung: Secondary | ICD-10-CM

## 2020-07-13 DIAGNOSIS — Z7984 Long term (current) use of oral hypoglycemic drugs: Secondary | ICD-10-CM

## 2020-07-13 DIAGNOSIS — W19XXXA Unspecified fall, initial encounter: Secondary | ICD-10-CM | POA: Diagnosis not present

## 2020-07-13 DIAGNOSIS — Z7982 Long term (current) use of aspirin: Secondary | ICD-10-CM | POA: Diagnosis not present

## 2020-07-13 DIAGNOSIS — Z87891 Personal history of nicotine dependence: Secondary | ICD-10-CM

## 2020-07-13 DIAGNOSIS — I998 Other disorder of circulatory system: Secondary | ICD-10-CM | POA: Diagnosis not present

## 2020-07-13 DIAGNOSIS — G8111 Spastic hemiplegia affecting right dominant side: Secondary | ICD-10-CM | POA: Diagnosis not present

## 2020-07-13 DIAGNOSIS — W01190A Fall on same level from slipping, tripping and stumbling with subsequent striking against furniture, initial encounter: Secondary | ICD-10-CM | POA: Diagnosis present

## 2020-07-13 DIAGNOSIS — Z823 Family history of stroke: Secondary | ICD-10-CM

## 2020-07-13 DIAGNOSIS — M25551 Pain in right hip: Secondary | ICD-10-CM | POA: Diagnosis not present

## 2020-07-13 DIAGNOSIS — M79661 Pain in right lower leg: Secondary | ICD-10-CM | POA: Diagnosis not present

## 2020-07-13 DIAGNOSIS — M25451 Effusion, right hip: Secondary | ICD-10-CM | POA: Diagnosis not present

## 2020-07-13 DIAGNOSIS — Z20822 Contact with and (suspected) exposure to covid-19: Secondary | ICD-10-CM | POA: Diagnosis present

## 2020-07-13 DIAGNOSIS — B372 Candidiasis of skin and nail: Secondary | ICD-10-CM | POA: Diagnosis not present

## 2020-07-13 DIAGNOSIS — I739 Peripheral vascular disease, unspecified: Secondary | ICD-10-CM | POA: Diagnosis not present

## 2020-07-13 DIAGNOSIS — Z83438 Family history of other disorder of lipoprotein metabolism and other lipidemia: Secondary | ICD-10-CM

## 2020-07-13 DIAGNOSIS — W19XXXD Unspecified fall, subsequent encounter: Secondary | ICD-10-CM | POA: Diagnosis not present

## 2020-07-13 DIAGNOSIS — Z791 Long term (current) use of non-steroidal anti-inflammatories (NSAID): Secondary | ICD-10-CM | POA: Diagnosis not present

## 2020-07-13 DIAGNOSIS — M1611 Unilateral primary osteoarthritis, right hip: Secondary | ICD-10-CM | POA: Diagnosis not present

## 2020-07-13 LAB — BASIC METABOLIC PANEL
Anion gap: 12 (ref 5–15)
BUN: 20 mg/dL (ref 8–23)
CO2: 26 mmol/L (ref 22–32)
Calcium: 8.7 mg/dL — ABNORMAL LOW (ref 8.9–10.3)
Chloride: 97 mmol/L — ABNORMAL LOW (ref 98–111)
Creatinine, Ser: 1.77 mg/dL — ABNORMAL HIGH (ref 0.61–1.24)
GFR, Estimated: 43 mL/min — ABNORMAL LOW (ref >60)
Glucose, Bld: 387 mg/dL — ABNORMAL HIGH (ref 70–99)
Potassium: 3.2 mmol/L — ABNORMAL LOW (ref 3.5–5.1)
Sodium: 135 mmol/L (ref 135–145)

## 2020-07-13 LAB — CBC
HCT: 40 % (ref 39.0–52.0)
Hemoglobin: 13.3 g/dL (ref 13.0–17.0)
MCH: 29.2 pg (ref 26.0–34.0)
MCHC: 33.3 g/dL (ref 30.0–36.0)
MCV: 87.7 fL (ref 80.0–100.0)
Platelets: 228 10*3/uL (ref 150–400)
RBC: 4.56 MIL/uL (ref 4.22–5.81)
RDW: 14.3 % (ref 11.5–15.5)
WBC: 10.6 10*3/uL — ABNORMAL HIGH (ref 4.0–10.5)
nRBC: 0 % (ref 0.0–0.2)

## 2020-07-13 LAB — CBG MONITORING, ED
Glucose-Capillary: 336 mg/dL — ABNORMAL HIGH (ref 70–99)
Glucose-Capillary: 330 mg/dL — ABNORMAL HIGH (ref 70–99)

## 2020-07-13 LAB — GLUCOSE 16585: Glucose: 333 mg/dL — ABNORMAL HIGH (ref 65–99)

## 2020-07-13 MED ORDER — SODIUM CHLORIDE 0.9 % IV BOLUS
500.0000 mL | Freq: Once | INTRAVENOUS | Status: AC
  Administered 2020-07-14: 500 mL via INTRAVENOUS

## 2020-07-13 MED ORDER — MORPHINE SULFATE (PF) 4 MG/ML IV SOLN
4.0000 mg | Freq: Once | INTRAVENOUS | Status: AC
  Administered 2020-07-14: 4 mg via INTRAVENOUS
  Filled 2020-07-13: qty 1

## 2020-07-13 NOTE — Telephone Encounter (Signed)
fell last night paramedic came pt wasnt admitted blood sugar was high also legs are weak

## 2020-07-13 NOTE — ED Notes (Signed)
Pt reports that he changed his insulin type x2 days ago and "its been downhill ever since".

## 2020-07-13 NOTE — Progress Notes (Signed)
Subjective:    Patient ID: Eduardo Young, male    DOB: Mar 03, 1956, 64 y.o.   MRN: [id number]  Patient states that he wants to go to the hospital.  He has not been checking his blood sugars.  During this period of time he supposed to be on Basaglar 45 units daily and 7 units of rapid acting insulin with supper.  He is not been taking the rapid acting insulin but he states that he has been taking the [place] for the last 3 to 4 days.  He reports polyuria, polydipsia, and weight loss.  This morning he had to call EMS to his house.  He stood to get into his bed.  He has right hemiparesis.  However usually he can stand and putting his weight on his left leg walk several steps and turn to get into the bed.  This morning the entire right side of his body would not respond.  He was having to take short shuffling steps because he could not put any weight on his right leg.  He lost his balance and fell.  He laid in the floor for quite some time before his sister found him.  EMS was called.  On the scene his blood sugar was over 500.  He came to the appointment today.  He is in a wheelchair unable to stand.  He is confused beyond his baseline.  He is having a difficult time answering questions.  His sister is having to provide most of the history.  This is not usual for this patient.  He denies any chest pain shortness of breath or dyspnea on exertion.  Random blood sugar here is 333. Past Medical History:  Diagnosis Date  . Chronic venous insufficiency   . Diabetes mellitus   . GERD (gastroesophageal reflux disease)   . Gout   . Hemiparesis ([place])   . Hyperlipidemia   . Hypertension   . Obesity   . Renal calculi   . Sleep apnea, obstructive   . Stroke [facility])    [id number]   Past Surgical History:  Procedure Laterality Date  . BIOPSY  09/06/2018   Procedure: BIOPSY;  Surgeon: Jerene Bears, MD;  Location: [facility];  Service: Gastroenterology;;  . COLONOSCOPY WITH PROPOFOL N/A 09/06/2018   Procedure:  COLONOSCOPY WITH PROPOFOL;  Surgeon: Jerene Bears, MD;  Location: [facility];  Service: Gastroenterology;  Laterality: N/A;  . LITHOTRIPSY    . NASAL POLYP EXCISION     Current Outpatient Medications on File Prior to Visit  Medication Sig Dispense Refill  . ACCU-CHEK FASTCLIX LANCETS MISC Check BS BID DX - E11.9 200 each 3  . allopurinol (ZYLOPRIM) 100 MG tablet Take 1 tablet by mouth twice daily 180 tablet 1  . amLODipine (NORVASC) 10 MG tablet Take 1 tablet by mouth once daily 30 tablet 0  . aspirin 81 MG tablet Take 81 mg by mouth every evening.     Marland Kitchen atorvastatin (LIPITOR) 40 MG tablet Take 1 tablet (40 mg total) by mouth daily. 90 tablet 1  . baclofen (LIORESAL) 10 MG tablet Take 0.5-1 tablets (5-10 mg total) by mouth 3 (three) times daily as needed for muscle spasms. 30 each 3  . Blood Glucose Monitoring Suppl (ACCU-CHEK AVIVA PLUS) w/Device KIT Check BS BID DX - E11.9 1 kit 1  . cloNIDine (CATAPRES) 0.2 MG tablet Take 1 tablet by mouth twice daily 60 tablet 0  . doxazosin (CARDURA) 4 MG tablet Take 1 tablet by  mouth once daily 90 tablet 1  . hydrALAZINE (APRESOLINE) 25 MG tablet Take 2 tablets (50 mg total) by mouth 3 (three) times daily. 180 tablet 5  . hydrochlorothiazide (HYDRODIURIL) 25 MG tablet Take 1 tablet (25 mg total) by mouth daily. 90 tablet 3  . HYDROcodone-acetaminophen (NORCO) 5-325 MG tablet Take 1 tablet by mouth every 6 (six) hours as needed for moderate pain. 20 tablet 0  . Insulin Glargine (BASAGLAR KWIKPEN) 100 UNIT/ML Inject 45 Units into the skin daily.    . insulin lispro (HUMALOG) 100 UNIT/ML KwikPen INJECT 7 UNITS SUBCUTANEOUSLY ONCE DAILY 15 mL 0  . labetalol (NORMODYNE) 300 MG tablet Take 1 tablet by mouth twice daily 180 tablet 1  . Lancets Misc. (ACCU-CHEK FASTCLIX LANCET) KIT Check BS BID DX - E11.9 1 kit 1  . linaclotide (LINZESS) 145 MCG CAPS capsule Take 1 capsule (145 mcg total) by mouth daily before breakfast. 30 capsule 3  . lisinopril (ZESTRIL)  20 MG tablet Take 1 tablet by mouth twice daily 180 tablet 1  . Multiple Vitamin (MULTIVITAMIN WITH MINERALS) TABS tablet Take 1 tablet by mouth daily.    . potassium chloride SA (K-DUR) 20 MEQ tablet Take 1 tablet (20 mEq total) by mouth daily. 30 tablet 3  . traMADol (ULTRAM) 50 MG tablet Take 1 tablet (50 mg total) by mouth every 6 (six) hours as needed. 30 tablet 0  . bisacodyl (BISACODYL) 5 MG EC tablet Take 1 tablet (5 mg total) by mouth daily as needed for moderate constipation. (Patient not taking: No sig reported) 30 tablet 0  . glucose blood (ACCU-CHEK AVIVA) test strip Check BS BID DX - E11.9 (Patient not taking: No sig reported) 150 each 3  . LANTUS SOLOSTAR 100 UNIT/ML Solostar Pen INJECT 45 UNITS SUBCUTANEOUSLY ONCE DAILY (Patient not taking: No sig reported) 15 mL 0  . meloxicam (MOBIC) 7.5 MG tablet Take 1 tablet (7.5 mg total) by mouth 2 (two) times daily as needed for pain. (Patient not taking: No sig reported) 30 tablet 2  . polyethylene glycol powder (GLYCOLAX/MIRALAX) powder Take 17 g by mouth daily. (Patient not taking: No sig reported) 510 g 11  . TRADJENTA 5 MG TABS tablet Take 1 tablet by mouth once daily (Patient not taking: No sig reported) 90 tablet 0   No current facility-administered medications on file prior to visit.   No Known Allergies Social History   Socioeconomic History  . Marital status: Single    Spouse name: Not on file  . Number of children: Not on file  . Years of education: Not on file  . Highest education level: Not on file  Occupational History  . Not on file  Tobacco Use  . Smoking status: Former Research scientist (life sciences)  . Smokeless tobacco: Never Used  Substance and Sexual Activity  . Alcohol use: Yes    Comment: Occasional  . Drug use: Yes    Types: Cocaine, Marijuana    Comment: Past Hx  . Sexual activity: Not on file  Other Topics Concern  . Not on file  Social History Narrative  . Not on file   Social Determinants of Health   Financial  Resource Strain: Medium Risk  . Difficulty of Paying Living Expenses: Somewhat hard  Food Insecurity: Not on file  Transportation Needs: Not on file  Physical Activity: Not on file  Stress: Not on file  Social Connections: Not on file  Intimate Partner Violence: Not on file      Review of  Systems  All other systems reviewed and are negative.      Objective:   Physical Exam Vitals reviewed.  Constitutional:      General: He is not in acute distress.    Appearance: He is well-developed. He is not diaphoretic.  Cardiovascular:     Rate and Rhythm: Normal rate and regular rhythm.     Heart sounds: Normal heart sounds. No murmur heard.   Pulmonary:     Effort: Pulmonary effort is normal. No respiratory distress.     Breath sounds: Normal breath sounds. No wheezing or rales.  Chest:     Chest wall: No tenderness.  Neurological:     Mental Status: He is confused.     Cranial Nerves: Cranial nerves are intact.     Motor: Weakness present.     Coordination: Coordination abnormal.     Gait: Gait abnormal.       Type 2 diabetes mellitus with peripheral neuropathy (HCC) - Plan: CBC with Differential/Platelet, COMPLETE METABOLIC PANEL WITH GFR, Glucose, fingerstick (stat), Hemoglobin A1c, CANCELED: Hemoglobin A1C, fingerstick  Patient is clearly weak today.  He appears dehydrated.  With his significant hyperglycemia I am concerned about dehydration and metabolic derangements such as hypokalemia and acute renal insufficiency.  I believe the patient needs IV fluids.  Also given his confusion and worsening weakness I am concerned about a possible stroke.  We discussed increasing his insulin and titrating at home however the patient does not feel safe at home alone.  He is unable to stand and care for himself.  He defecated on himself while in the room due to confusion.  Therefore I believe he needs to go to the emergency room for urgent lab work and IV fluids.

## 2020-07-13 NOTE — ED Triage Notes (Addendum)
Patient c/o right knee pain since last night. Patient reports that he had a stroke on the right side in 2008. Patient states he fell at 0300 today and his sister called EMS. Patient states it took 3 staff from EMS to get him back to bed. Patient states his blood sugar has been "high". Patient went to his PCP today and was told he may have had another stroke.  CBG- in triage-336

## 2020-07-13 NOTE — Telephone Encounter (Signed)
Patient had Same Day office visit on 07-13-20

## 2020-07-13 NOTE — ED Notes (Signed)
Gold top tube sent to lab.

## 2020-07-13 NOTE — ED Provider Notes (Signed)
[facility] DEPT Provider Note   CSN: [id number] Arrival date & time: 07/13/20  8016     History Chief Complaint  Patient presents with  . Leg Pain  . Hyperglycemia  . Fall    Eduardo Young is a 64 y.o. male.  64 year old male with prior medical history as detailed below presents for evaluation.  Patient reports fall at home approximately 20 hours previously.  Patient reports that he was ambulating at home when he lost his balance and fell hard on his right side.  Patient complains of pain to the right knee, right hip, and his right scalp where he struck his head.  He denies loss conscious.  Patient reports that he is status post CVA with right hemiplegia.  Patient is ambulatory with a walker at home.  Since the fall this morning he has been unable to ambulate.  He complains of significant pain in the right lower extremity in addition to his chronic weakness.  The history is provided by the patient and medical records.  Leg Pain Location:  Hip and leg Time since incident:  20 hours Injury: yes   Hip location:  R hip Leg location:  R lower leg Pain details:    Quality:  Aching   Severity:  Mild   Onset quality:  Sudden   Duration:  20 hours   Timing:  Constant   Progression:  Unable to specify Hyperglycemia Fall       Past Medical History:  Diagnosis Date  . Chronic venous insufficiency   . Diabetes mellitus   . GERD (gastroesophageal reflux disease)   . Gout   . Hemiparesis ([place])   . Hyperlipidemia   . Hypertension   . Obesity   . Renal calculi   . Sleep apnea, obstructive   . Stroke [facility])    [id number]    Patient Active Problem List   Diagnosis Date Noted  . Pain in right hip 10/14/2019  . Rectal bleeding   . Segmental colitis with complication ([place])   . Adjustment disorder with mixed anxiety and depressed mood   . Benign essential HTN   . Type 2 diabetes mellitus with peripheral neuropathy (HCC)   . Stage 3 chronic  kidney disease ([place])   . Spastic hemiparesis of right dominant side ([place]) 09/08/2016  . Weakness   . Myoclonus   . Seizures ([place])   . TIA (transient ischemic attack) 09/07/2016  . Chronic venous insufficiency   . History of cerebral infarction ([place]) 10/21/2010  . DM (diabetes mellitus) ([place]) 10/21/2010  . HLD (hyperlipidemia) 10/21/2010  . HTN (hypertension) 10/21/2010  . Sleep apnea 10/21/2010    Past Surgical History:  Procedure Laterality Date  . BIOPSY  09/06/2018   Procedure: BIOPSY;  Surgeon: Jerene Bears, MD;  Location: [facility];  Service: Gastroenterology;;  . COLONOSCOPY WITH PROPOFOL N/A 09/06/2018   Procedure: COLONOSCOPY WITH PROPOFOL;  Surgeon: Jerene Bears, MD;  Location: [facility];  Service: Gastroenterology;  Laterality: N/A;  . LITHOTRIPSY    . NASAL POLYP EXCISION         Family History  Problem Relation Age of Onset  . Heart attack Mother   . Hypertension Mother   . Hyperlipidemia Mother   . Hypertension Father   . Hyperlipidemia Father   . CVA Father   . Heart attack Sister   . Lung cancer Brother   . Heart attack Brother   . Ovarian cancer Sister   . Heart attack  Sister   . CVA Sister     Social History   Tobacco Use  . Smoking status: Former Research scientist (life sciences)  . Smokeless tobacco: Never Used  Vaping Use  . Vaping Use: Never used  Substance Use Topics  . Alcohol use: Yes    Comment: Occasional  . Drug use: Not Currently    Types: Cocaine, Marijuana    Home Medications Prior to Admission medications   Medication Sig Start Date End Date Taking? Authorizing Provider  ACCU-CHEK FASTCLIX LANCETS MISC Check BS BID DX - E11.9 09/04/18   Susy Frizzle, MD  allopurinol (ZYLOPRIM) 100 MG tablet Take 1 tablet by mouth twice daily 01/29/20   Susy Frizzle, MD  amLODipine (NORVASC) 10 MG tablet Take 1 tablet by mouth once daily 06/11/20   Susy Frizzle, MD  aspirin 81 MG tablet Take 81 mg by mouth every evening.     [provider]   atorvastatin (LIPITOR) 40 MG tablet Take 1 tablet (40 mg total) by mouth daily. 01/07/20   Susy Frizzle, MD  baclofen (LIORESAL) 10 MG tablet Take 0.5-1 tablets (5-10 mg total) by mouth 3 (three) times daily as needed for muscle spasms. 06/22/20   Hilts, Legrand Como, MD  bisacodyl (BISACODYL) 5 MG EC tablet Take 1 tablet (5 mg total) by mouth daily as needed for moderate constipation. Patient not taking: No sig reported 09/17/18   Susy Frizzle, MD  Blood Glucose Monitoring Suppl (ACCU-CHEK AVIVA PLUS) w/Device KIT Check BS BID DX - E11.9 09/04/18   Susy Frizzle, MD  cloNIDine (CATAPRES) 0.2 MG tablet Take 1 tablet by mouth twice daily 06/11/20   Susy Frizzle, MD  doxazosin (CARDURA) 4 MG tablet Take 1 tablet by mouth once daily 01/29/20   Susy Frizzle, MD  glucose blood (ACCU-CHEK AVIVA) test strip Check BS BID DX - E11.9 Patient not taking: No sig reported 09/04/18   Susy Frizzle, MD  hydrALAZINE (APRESOLINE) 25 MG tablet Take 2 tablets (50 mg total) by mouth 3 (three) times daily. 01/07/20   Susy Frizzle, MD  hydrochlorothiazide (HYDRODIURIL) 25 MG tablet Take 1 tablet (25 mg total) by mouth daily. 01/07/20   Susy Frizzle, MD  HYDROcodone-acetaminophen (NORCO) 5-325 MG tablet Take 1 tablet by mouth every 6 (six) hours as needed for moderate pain. 10/06/19   Susy Frizzle, MD  Insulin Glargine (BASAGLAR KWIKPEN) 100 UNIT/ML Inject 45 Units into the skin daily.    Susy Frizzle, MD  insulin lispro (HUMALOG) 100 UNIT/ML KwikPen INJECT 7 UNITS SUBCUTANEOUSLY ONCE DAILY 12/10/19   Susy Frizzle, MD  labetalol (NORMODYNE) 300 MG tablet Take 1 tablet by mouth twice daily 01/29/20   Susy Frizzle, MD  Lancets Misc. (ACCU-CHEK FASTCLIX LANCET) KIT Check BS BID DX - E11.9 09/04/18   Susy Frizzle, MD  LANTUS SOLOSTAR 100 UNIT/ML Solostar Pen INJECT 45 UNITS SUBCUTANEOUSLY ONCE DAILY Patient not taking: No sig reported 03/25/20   Susy Frizzle, MD  linaclotide  [facility]) 145 MCG CAPS capsule Take 1 capsule (145 mcg total) by mouth daily before breakfast. 09/17/18   Susy Frizzle, MD  lisinopril (ZESTRIL) 20 MG tablet Take 1 tablet by mouth twice daily 01/29/20   Susy Frizzle, MD  meloxicam (MOBIC) 7.5 MG tablet Take 1 tablet (7.5 mg total) by mouth 2 (two) times daily as needed for pain. Patient not taking: No sig reported 10/14/19   Leandrew Koyanagi, MD  Multiple Vitamin (  MULTIVITAMIN WITH MINERALS) TABS tablet Take 1 tablet by mouth daily.    [provider]  polyethylene glycol powder (GLYCOLAX/MIRALAX) powder Take 17 g by mouth daily. Patient not taking: No sig reported 09/06/18   Pyrtle, Lajuan Lines, MD  potassium chloride SA (K-DUR) 20 MEQ tablet Take 1 tablet (20 mEq total) by mouth daily. 04/09/19   Susy Frizzle, MD  TRADJENTA 5 MG TABS tablet Take 1 tablet by mouth once daily Patient not taking: No sig reported 01/06/19   Susy Frizzle, MD  traMADol (ULTRAM) 50 MG tablet Take 1 tablet (50 mg total) by mouth every 6 (six) hours as needed. 06/22/20   Hilts, Legrand Como, MD    Allergies    Patient has no known allergies.  Review of Systems   Review of Systems  All other systems reviewed and are negative.   Physical Exam Updated Vital Signs BP 132/78   Pulse 67   Temp 99.1 F (37.3 C) (Oral)   Resp 18   Ht 6' 3"  (1.905 m)   Wt (!) 140.6 kg   SpO2 94%   BMI 38.75 kg/m   Physical Exam Vitals and nursing note reviewed.  Constitutional:      General: He is not in acute distress.    Appearance: Normal appearance. He is well-developed and well-nourished.  HENT:     Head: Normocephalic and atraumatic.     Mouth/Throat:     Mouth: Oropharynx is clear and moist.  Eyes:     Extraocular Movements: EOM normal.     Conjunctiva/sclera: Conjunctivae normal.     Pupils: Pupils are equal, round, and reactive to light.  Cardiovascular:     Rate and Rhythm: Normal rate and regular rhythm.     Heart sounds: Normal heart sounds.   Pulmonary:     Effort: Pulmonary effort is normal. No respiratory distress.     Breath sounds: Normal breath sounds.  Abdominal:     General: There is no distension.     Palpations: Abdomen is soft.     Tenderness: There is no abdominal tenderness.  Musculoskeletal:        General: No deformity or edema. Normal range of motion.     Cervical back: Normal range of motion and neck supple.  Skin:    General: Skin is warm and dry.  Neurological:     Mental Status: He is alert and oriented to person, place, and time.     Comments: Right hemiplegia - RUE Flaccid, RLE with intact proximal strength (5/5) - Appears close to baseline per patient and family report.   Psychiatric:        Mood and Affect: Mood and affect normal.     ED Results / Procedures / Treatments   Labs (all labs ordered are listed, but only abnormal results are displayed) Labs Reviewed  BASIC METABOLIC PANEL - Abnormal; Notable for the following components:      Result Value   Potassium 3.2 (*)    Chloride 97 (*)    Glucose, Bld 387 (*)    Creatinine, Ser 1.77 (*)    Calcium 8.7 (*)    GFR, Estimated 43 (*)    All other components within normal limits  CBC - Abnormal; Notable for the following components:   WBC 10.6 (*)    All other components within normal limits  CBG MONITORING, ED - Abnormal; Notable for the following components:   Glucose-Capillary 336 (*)    All other components within normal  limits  CBG MONITORING, ED - Abnormal; Notable for the following components:   Glucose-Capillary 330 (*)    All other components within normal limits  RESP PANEL BY RT-PCR (FLU A&B, COVID) [id number]  URINALYSIS, ROUTINE W REFLEX MICROSCOPIC  CBG MONITORING, ED    EKG None  Radiology DG Knee Complete 4 Views Right  Result Date: 07/13/2020 CLINICAL DATA:  Right knee injury status post fall EXAM: RIGHT KNEE - COMPLETE 4+ VIEW COMPARISON:  None. FINDINGS: No evidence of fracture or dislocation. Small effusion. No  evidence of severe arthropathy. No aggressive appearing focal bone abnormality. Soft tissues are unremarkable. IMPRESSION: No acute displaced fracture or dislocation. Electronically Signed   By: Iven Finn M.D.   On: 07/13/2020 17:38    Procedures Procedures (including critical care time)  Medications Ordered in ED Medications  sodium chloride 0.9 % bolus 500 mL (has no administration in time range)    ED Course  I have reviewed the triage vital signs and the nursing notes.  Pertinent labs & imaging results that were available during my care of the patient were reviewed by me and considered in my medical decision making (see chart for details).    MDM Rules/Calculators/A&P                           MDM  Screen complete  Eduardo Young was evaluated in Emergency Department on 07/13/2020 for the symptoms described in the history of present illness. He was evaluated in the context of the global COVID-19 pandemic, which necessitated consideration that the patient might be at risk for infection with the SARS-CoV-2 virus that causes COVID-19. Institutional protocols and algorithms that pertain to the evaluation of patients at risk for COVID-19 are in a state of rapid change based on information released by regulatory bodies including the CDC and federal and state organizations. These policies and algorithms were followed during the patient's care in the ED.  Patient is presenting for evaluation after reported fall early this morning.  Patient reports that he is unable to ambulate after the fall.  He complains of significant pain to the right hip and right lower extremity.  At baseline the patient is able to ambulate with a walker.  His ambulatory status is complicated by his history of stroke with right-sided hemiparesis.  Patient's work-up is suggestive of possible acute right hip fracture - found on plain film.   CT imaging of right hip is pending. Patient is unable to safely  ambulate. Hospitalist - Dr. Marlyce Huge - is aware of case and will evaluate for admission. He is aware of pending CT right hip.     Final Clinical Impression(s) / ED Diagnoses Final diagnoses:  Fall, initial encounter  Right leg pain    Rx / DC Orders ED Discharge Orders    None       Valarie Merino, MD 07/14/20 0004

## 2020-07-14 ENCOUNTER — Encounter (HOSPITAL_COMMUNITY): Payer: Self-pay | Admitting: Internal Medicine

## 2020-07-14 ENCOUNTER — Observation Stay (HOSPITAL_COMMUNITY): Payer: Medicare Other

## 2020-07-14 DIAGNOSIS — Z794 Long term (current) use of insulin: Secondary | ICD-10-CM

## 2020-07-14 DIAGNOSIS — M25551 Pain in right hip: Secondary | ICD-10-CM | POA: Diagnosis present

## 2020-07-14 DIAGNOSIS — Z87442 Personal history of urinary calculi: Secondary | ICD-10-CM | POA: Diagnosis not present

## 2020-07-14 DIAGNOSIS — B372 Candidiasis of skin and nail: Secondary | ICD-10-CM | POA: Diagnosis present

## 2020-07-14 DIAGNOSIS — K219 Gastro-esophageal reflux disease without esophagitis: Secondary | ICD-10-CM | POA: Diagnosis present

## 2020-07-14 DIAGNOSIS — E782 Mixed hyperlipidemia: Secondary | ICD-10-CM

## 2020-07-14 DIAGNOSIS — Z7984 Long term (current) use of oral hypoglycemic drugs: Secondary | ICD-10-CM | POA: Diagnosis not present

## 2020-07-14 DIAGNOSIS — J189 Pneumonia, unspecified organism: Secondary | ICD-10-CM | POA: Diagnosis present

## 2020-07-14 DIAGNOSIS — N1831 Chronic kidney disease, stage 3a: Secondary | ICD-10-CM

## 2020-07-14 DIAGNOSIS — M1611 Unilateral primary osteoarthritis, right hip: Secondary | ICD-10-CM | POA: Diagnosis not present

## 2020-07-14 DIAGNOSIS — G8111 Spastic hemiplegia affecting right dominant side: Secondary | ICD-10-CM

## 2020-07-14 DIAGNOSIS — I10 Essential (primary) hypertension: Secondary | ICD-10-CM

## 2020-07-14 DIAGNOSIS — E1165 Type 2 diabetes mellitus with hyperglycemia: Secondary | ICD-10-CM | POA: Diagnosis present

## 2020-07-14 DIAGNOSIS — E1122 Type 2 diabetes mellitus with diabetic chronic kidney disease: Secondary | ICD-10-CM

## 2020-07-14 DIAGNOSIS — M25451 Effusion, right hip: Secondary | ICD-10-CM | POA: Diagnosis not present

## 2020-07-14 DIAGNOSIS — Y92009 Unspecified place in unspecified non-institutional (private) residence as the place of occurrence of the external cause: Secondary | ICD-10-CM

## 2020-07-14 DIAGNOSIS — E876 Hypokalemia: Secondary | ICD-10-CM | POA: Diagnosis present

## 2020-07-14 DIAGNOSIS — E1169 Type 2 diabetes mellitus with other specified complication: Secondary | ICD-10-CM | POA: Diagnosis present

## 2020-07-14 DIAGNOSIS — Z79899 Other long term (current) drug therapy: Secondary | ICD-10-CM | POA: Diagnosis not present

## 2020-07-14 DIAGNOSIS — M109 Gout, unspecified: Secondary | ICD-10-CM | POA: Diagnosis present

## 2020-07-14 DIAGNOSIS — W19XXXA Unspecified fall, initial encounter: Secondary | ICD-10-CM

## 2020-07-14 DIAGNOSIS — Z791 Long term (current) use of non-steroidal anti-inflammatories (NSAID): Secondary | ICD-10-CM | POA: Diagnosis not present

## 2020-07-14 DIAGNOSIS — W01190A Fall on same level from slipping, tripping and stumbling with subsequent striking against furniture, initial encounter: Secondary | ICD-10-CM | POA: Diagnosis present

## 2020-07-14 DIAGNOSIS — E1142 Type 2 diabetes mellitus with diabetic polyneuropathy: Secondary | ICD-10-CM | POA: Diagnosis present

## 2020-07-14 DIAGNOSIS — G4733 Obstructive sleep apnea (adult) (pediatric): Secondary | ICD-10-CM | POA: Diagnosis present

## 2020-07-14 DIAGNOSIS — I69151 Hemiplegia and hemiparesis following nontraumatic intracerebral hemorrhage affecting right dominant side: Secondary | ICD-10-CM | POA: Diagnosis not present

## 2020-07-14 DIAGNOSIS — Z87891 Personal history of nicotine dependence: Secondary | ICD-10-CM | POA: Diagnosis not present

## 2020-07-14 DIAGNOSIS — Z8249 Family history of ischemic heart disease and other diseases of the circulatory system: Secondary | ICD-10-CM | POA: Diagnosis not present

## 2020-07-14 DIAGNOSIS — Z20822 Contact with and (suspected) exposure to covid-19: Secondary | ICD-10-CM | POA: Diagnosis present

## 2020-07-14 DIAGNOSIS — M25561 Pain in right knee: Secondary | ICD-10-CM | POA: Diagnosis present

## 2020-07-14 DIAGNOSIS — I129 Hypertensive chronic kidney disease with stage 1 through stage 4 chronic kidney disease, or unspecified chronic kidney disease: Secondary | ICD-10-CM | POA: Diagnosis present

## 2020-07-14 DIAGNOSIS — Z7982 Long term (current) use of aspirin: Secondary | ICD-10-CM | POA: Diagnosis not present

## 2020-07-14 DIAGNOSIS — R519 Headache, unspecified: Secondary | ICD-10-CM | POA: Diagnosis present

## 2020-07-14 LAB — COMPLETE METABOLIC PANEL WITH GFR
AG Ratio: 1.5 (calc) (ref 1.0–2.5)
ALT: 19 U/L (ref 9–46)
AST: 15 U/L (ref 10–35)
Albumin: 3.6 g/dL (ref 3.6–5.1)
Alkaline phosphatase (APISO): 125 U/L (ref 35–144)
BUN/Creatinine Ratio: 11 (calc) (ref 6–22)
BUN: 18 mg/dL (ref 7–25)
CO2: 30 mmol/L (ref 20–32)
Calcium: 8.9 mg/dL (ref 8.6–10.3)
Chloride: 97 mmol/L — ABNORMAL LOW (ref 98–110)
Creat: 1.65 mg/dL — ABNORMAL HIGH (ref 0.70–1.25)
GFR, Est African American: 51 mL/min/{1.73_m2} — ABNORMAL LOW (ref >=60)
GFR, Est Non African American: 44 mL/min/{1.73_m2} — ABNORMAL LOW (ref >=60)
Globulin: 2.4 g/dL (calc) (ref 1.9–3.7)
Glucose, Bld: 316 mg/dL — ABNORMAL HIGH (ref 65–99)
Potassium: 3.4 mmol/L — ABNORMAL LOW (ref 3.5–5.3)
Sodium: 140 mmol/L (ref 135–146)
Total Bilirubin: 0.6 mg/dL (ref 0.2–1.2)
Total Protein: 6 g/dL — ABNORMAL LOW (ref 6.1–8.1)

## 2020-07-14 LAB — CBC WITH DIFFERENTIAL/PLATELET
Abs Immature Granulocytes: 0.02 10*3/uL (ref 0.00–0.07)
Absolute Monocytes: 500 cells/uL (ref 200–950)
Basophils Absolute: 41 cells/uL (ref 0–200)
Basophils Absolute: 0 10*3/uL (ref 0.0–0.1)
Basophils Relative: 0.4 %
Basophils Relative: 0 %
Eosinophils Absolute: 520 cells/uL — ABNORMAL HIGH (ref 15–500)
Eosinophils Absolute: 0.4 10*3/uL (ref 0.0–0.5)
Eosinophils Relative: 5.1 %
Eosinophils Relative: 5 %
HCT: 38.7 % (ref 38.5–50.0)
HCT: 37.3 % — ABNORMAL LOW (ref 39.0–52.0)
Hemoglobin: 13.3 g/dL (ref 13.2–17.1)
Hemoglobin: 12.2 g/dL — ABNORMAL LOW (ref 13.0–17.0)
Immature Granulocytes: 0 %
Lymphocytes Relative: 26 %
Lymphs Abs: 1510 cells/uL (ref 850–3900)
Lymphs Abs: 2 10*3/uL (ref 0.7–4.0)
MCH: 29.8 pg (ref 27.0–33.0)
MCH: 29.3 pg (ref 26.0–34.0)
MCHC: 34.4 g/dL (ref 32.0–36.0)
MCHC: 32.7 g/dL (ref 30.0–36.0)
MCV: 86.6 fL (ref 80.0–100.0)
MCV: 89.4 fL (ref 80.0–100.0)
MPV: 10.9 fL (ref 7.5–12.5)
Monocytes Absolute: 0.5 10*3/uL (ref 0.1–1.0)
Monocytes Relative: 4.9 %
Monocytes Relative: 7 %
Neutro Abs: 7630 cells/uL (ref 1500–7800)
Neutro Abs: 5 10*3/uL (ref 1.7–7.7)
Neutrophils Relative %: 74.8 %
Neutrophils Relative %: 62 %
Platelets: 227 10*3/uL (ref 140–400)
Platelets: 175 10*3/uL (ref 150–400)
RBC: 4.47 10*6/uL (ref 4.20–5.80)
RBC: 4.17 MIL/uL — ABNORMAL LOW (ref 4.22–5.81)
RDW: 14.3 % (ref 11.0–15.0)
RDW: 14.4 % (ref 11.5–15.5)
Total Lymphocyte: 14.8 %
WBC: 10.2 10*3/uL (ref 3.8–10.8)
WBC: 8 10*3/uL (ref 4.0–10.5)
nRBC: 0 % (ref 0.0–0.2)

## 2020-07-14 LAB — URINALYSIS, ROUTINE W REFLEX MICROSCOPIC
Bilirubin Urine: NEGATIVE
Glucose, UA: >=500 mg/dL — AB
Hgb urine dipstick: NEGATIVE
Ketones, ur: NEGATIVE mg/dL
Leukocytes,Ua: NEGATIVE
Nitrite: NEGATIVE
Protein, ur: 100 mg/dL — AB
Specific Gravity, Urine: 1.014 (ref 1.005–1.030)
pH: 5 (ref 5.0–8.0)

## 2020-07-14 LAB — COMPREHENSIVE METABOLIC PANEL
ALT: 22 U/L (ref 0–44)
AST: 15 U/L (ref 15–41)
Albumin: 3.3 g/dL — ABNORMAL LOW (ref 3.5–5.0)
Alkaline Phosphatase: 100 U/L (ref 38–126)
Anion gap: 13 (ref 5–15)
BUN: 21 mg/dL (ref 8–23)
CO2: 26 mmol/L (ref 22–32)
Calcium: 8.1 mg/dL — ABNORMAL LOW (ref 8.9–10.3)
Chloride: 98 mmol/L (ref 98–111)
Creatinine, Ser: 1.67 mg/dL — ABNORMAL HIGH (ref 0.61–1.24)
GFR, Estimated: 46 mL/min — ABNORMAL LOW (ref >60)
Glucose, Bld: 270 mg/dL — ABNORMAL HIGH (ref 70–99)
Potassium: 2.7 mmol/L — CL (ref 3.5–5.1)
Sodium: 137 mmol/L (ref 135–145)
Total Bilirubin: 0.8 mg/dL (ref 0.3–1.2)
Total Protein: 6.1 g/dL — ABNORMAL LOW (ref 6.5–8.1)

## 2020-07-14 LAB — GLUCOSE, CAPILLARY
Glucose-Capillary: 238 mg/dL — ABNORMAL HIGH (ref 70–99)
Glucose-Capillary: 289 mg/dL — ABNORMAL HIGH (ref 70–99)
Glucose-Capillary: 234 mg/dL — ABNORMAL HIGH (ref 70–99)
Glucose-Capillary: 261 mg/dL — ABNORMAL HIGH (ref 70–99)

## 2020-07-14 LAB — RESP PANEL BY RT-PCR (FLU A&B, COVID) ARPGX2
Influenza A by PCR: NEGATIVE
Influenza B by PCR: NEGATIVE
SARS Coronavirus 2 by RT PCR: NEGATIVE

## 2020-07-14 LAB — HEMOGLOBIN A1C: Hgb A1c MFr Bld: >14 % of total Hgb — ABNORMAL HIGH (ref <5.7)

## 2020-07-14 LAB — HIV ANTIBODY (ROUTINE TESTING W REFLEX): HIV Screen 4th Generation wRfx: NONREACTIVE

## 2020-07-14 LAB — MAGNESIUM: Magnesium: 1.8 mg/dL (ref 1.7–2.4)

## 2020-07-14 MED ORDER — MORPHINE SULFATE (PF) 2 MG/ML IV SOLN: 2.0000 mg | INTRAVENOUS | Status: DC | PRN

## 2020-07-14 MED ORDER — ASPIRIN EC 81 MG PO TBEC
81.0000 mg | DELAYED_RELEASE_TABLET | Freq: Every evening | ORAL | Status: DC
  Administered 2020-07-14 – 2020-07-15 (×2): 81 mg via ORAL
  Filled 2020-07-14 (×2): qty 1

## 2020-07-14 MED ORDER — NYSTATIN 100000 UNIT/GM EX POWD
Freq: Two times a day (BID) | CUTANEOUS | Status: DC
  Filled 2020-07-14: qty 15

## 2020-07-14 MED ORDER — DOXAZOSIN MESYLATE 4 MG PO TABS: 4.0000 mg | ORAL_TABLET | Freq: Every day | ORAL | Status: DC

## 2020-07-14 MED ORDER — INSULIN ASPART 100 UNIT/ML ~~LOC~~ SOLN
5.0000 [IU] | Freq: Two times a day (BID) | SUBCUTANEOUS | Status: DC
  Administered 2020-07-15 – 2020-07-16 (×3): 5 [IU] via SUBCUTANEOUS

## 2020-07-14 MED ORDER — INSULIN ASPART 100 UNIT/ML ~~LOC~~ SOLN
7.0000 [IU] | Freq: Every day | SUBCUTANEOUS | Status: DC
  Administered 2020-07-14 – 2020-07-15 (×2): 7 [IU] via SUBCUTANEOUS
  Filled 2020-07-14: qty 0.07

## 2020-07-14 MED ORDER — ACETAMINOPHEN 325 MG PO TABS
650.0000 mg | ORAL_TABLET | Freq: Four times a day (QID) | ORAL | Status: DC | PRN
  Administered 2020-07-15: 650 mg via ORAL
  Filled 2020-07-14: qty 2

## 2020-07-14 MED ORDER — LABETALOL HCL 100 MG PO TABS
300.0000 mg | ORAL_TABLET | Freq: Two times a day (BID) | ORAL | Status: DC
  Administered 2020-07-14 – 2020-07-16 (×5): 300 mg via ORAL
  Filled 2020-07-14 (×5): qty 3

## 2020-07-14 MED ORDER — AMLODIPINE BESYLATE 10 MG PO TABS
10.0000 mg | ORAL_TABLET | Freq: Every day | ORAL | Status: DC
  Administered 2020-07-14 – 2020-07-16 (×3): 10 mg via ORAL
  Filled 2020-07-14 (×3): qty 1

## 2020-07-14 MED ORDER — BACLOFEN 10 MG PO TABS: 5.0000 mg | ORAL_TABLET | Freq: Three times a day (TID) | ORAL | Status: DC | PRN

## 2020-07-14 MED ORDER — ENOXAPARIN SODIUM 60 MG/0.6ML ~~LOC~~ SOLN
60.0000 mg | SUBCUTANEOUS | Status: DC
  Administered 2020-07-14 – 2020-07-16 (×3): 60 mg via SUBCUTANEOUS
  Filled 2020-07-14 (×3): qty 0.6

## 2020-07-14 MED ORDER — ONDANSETRON HCL 4 MG/2ML IJ SOLN: 4.0000 mg | Freq: Four times a day (QID) | INTRAMUSCULAR | Status: DC | PRN

## 2020-07-14 MED ORDER — BISACODYL 5 MG PO TBEC
5.0000 mg | DELAYED_RELEASE_TABLET | Freq: Every day | ORAL | Status: DC | PRN
  Filled 2020-07-14: qty 1

## 2020-07-14 MED ORDER — POLYETHYLENE GLYCOL 3350 17 G PO PACK: 17.0000 g | PACK | Freq: Every day | ORAL | Status: DC | PRN

## 2020-07-14 MED ORDER — HYDRALAZINE HCL 50 MG PO TABS
50.0000 mg | ORAL_TABLET | Freq: Three times a day (TID) | ORAL | Status: DC
  Administered 2020-07-14 – 2020-07-16 (×7): 50 mg via ORAL
  Filled 2020-07-14 (×7): qty 1

## 2020-07-14 MED ORDER — ACETAMINOPHEN 650 MG RE SUPP: 650.0000 mg | Freq: Four times a day (QID) | RECTAL | Status: DC | PRN

## 2020-07-14 MED ORDER — POLYETHYLENE GLYCOL 3350 17 GM/SCOOP PO POWD: 17.0000 g | Freq: Every day | ORAL | Status: DC

## 2020-07-14 MED ORDER — HYDRALAZINE HCL 20 MG/ML IJ SOLN: 10.0000 mg | Freq: Four times a day (QID) | INTRAMUSCULAR | Status: DC | PRN

## 2020-07-14 MED ORDER — INSULIN GLARGINE 100 UNIT/ML ~~LOC~~ SOLN
45.0000 [IU] | Freq: Every day | SUBCUTANEOUS | Status: DC
  Administered 2020-07-14: 45 [IU] via SUBCUTANEOUS
  Filled 2020-07-14: qty 0.45

## 2020-07-14 MED ORDER — CLONIDINE HCL 0.1 MG PO TABS
0.2000 mg | ORAL_TABLET | Freq: Two times a day (BID) | ORAL | Status: DC
  Administered 2020-07-14 – 2020-07-16 (×5): 0.2 mg via ORAL
  Filled 2020-07-14 (×5): qty 2

## 2020-07-14 MED ORDER — LACTATED RINGERS IV BOLUS
1000.0000 mL | Freq: Once | INTRAVENOUS | Status: AC
  Administered 2020-07-14: 1000 mL via INTRAVENOUS

## 2020-07-14 MED ORDER — POTASSIUM CHLORIDE 10 MEQ/100ML IV SOLN
10.0000 meq | INTRAVENOUS | Status: AC
  Administered 2020-07-14 (×2): 10 meq via INTRAVENOUS
  Filled 2020-07-14 (×2): qty 100

## 2020-07-14 MED ORDER — INSULIN ASPART 100 UNIT/ML ~~LOC~~ SOLN
0.0000 [IU] | Freq: Three times a day (TID) | SUBCUTANEOUS | Status: DC
  Administered 2020-07-14 (×2): 11 [IU] via SUBCUTANEOUS
  Administered 2020-07-14 (×2): 7 [IU] via SUBCUTANEOUS
  Administered 2020-07-15: 4 [IU] via SUBCUTANEOUS
  Administered 2020-07-15: 11 [IU] via SUBCUTANEOUS
  Administered 2020-07-15: 3 [IU] via SUBCUTANEOUS
  Administered 2020-07-15 – 2020-07-16 (×2): 4 [IU] via SUBCUTANEOUS
  Filled 2020-07-14: qty 0.2

## 2020-07-14 MED ORDER — OXYCODONE-ACETAMINOPHEN 5-325 MG PO TABS
1.0000 | ORAL_TABLET | ORAL | Status: DC | PRN
  Administered 2020-07-14 – 2020-07-15 (×4): 1 via ORAL
  Filled 2020-07-14 (×4): qty 1

## 2020-07-14 MED ORDER — INSULIN GLARGINE 100 UNIT/ML ~~LOC~~ SOLN
50.0000 [IU] | Freq: Every day | SUBCUTANEOUS | Status: DC
  Administered 2020-07-15 – 2020-07-16 (×2): 50 [IU] via SUBCUTANEOUS
  Filled 2020-07-14 (×2): qty 0.5

## 2020-07-14 MED ORDER — ATORVASTATIN CALCIUM 40 MG PO TABS
40.0000 mg | ORAL_TABLET | Freq: Every day | ORAL | Status: DC
  Administered 2020-07-14 – 2020-07-16 (×3): 40 mg via ORAL
  Filled 2020-07-14 (×3): qty 1

## 2020-07-14 MED ORDER — POTASSIUM CHLORIDE CRYS ER 20 MEQ PO TBCR
40.0000 meq | EXTENDED_RELEASE_TABLET | ORAL | Status: AC
  Administered 2020-07-14 (×2): 40 meq via ORAL
  Filled 2020-07-14 (×2): qty 2

## 2020-07-14 MED ORDER — LACTATED RINGERS IV SOLN: INTRAVENOUS | Status: DC

## 2020-07-14 MED ORDER — LISINOPRIL 20 MG PO TABS
20.0000 mg | ORAL_TABLET | Freq: Two times a day (BID) | ORAL | Status: DC
  Administered 2020-07-14 – 2020-07-16 (×5): 20 mg via ORAL
  Filled 2020-07-14 (×5): qty 1

## 2020-07-14 MED ORDER — ONDANSETRON HCL 4 MG PO TABS: 4.0000 mg | ORAL_TABLET | Freq: Four times a day (QID) | ORAL | Status: DC | PRN

## 2020-07-14 MED ORDER — ALLOPURINOL 100 MG PO TABS
100.0000 mg | ORAL_TABLET | Freq: Two times a day (BID) | ORAL | Status: DC
  Administered 2020-07-14 – 2020-07-16 (×5): 100 mg via ORAL
  Filled 2020-07-14 (×5): qty 1

## 2020-07-14 NOTE — Progress Notes (Signed)
CRITICAL VALUE ALERT  Critical Value:  K+ 2.7  Date & Time Notied:  07/14/20 6219  Provider Notified: Yes; Dr Gala Lewandowsky  Orders Received/Actions taken: awaiting her response

## 2020-07-14 NOTE — Progress Notes (Signed)
Physical Therapy Treatment Patient Details Name: Eduardo Young MRN: [id number] DOB: 03-05-1956 Today's Date: 07/14/2020    History of Present Illness 64 year old male with past medical history of hemorrhagic stroke with right-sided weakness, insulin-dependent diabetes mellitus type 2, hyperlipidemia, hypertension, obstructive sleep apnea (on cpap), gout and chronic kidney disease stage IIIa who presents to [facility] emergency department with complaints of right lower extremity pain status post fall. xray questionable for hip fx, CT negative, xray R knee = small effusion    PT Comments    Pt able to come to full  Stand to hemiwalker, however limited ability to wt shift to RLE to take a step d/t R knee pain.  At baseline pt locks R knee in full extension to take mostly lateral steps ~ 8'.  Otherwise performs stand pivot and uses w/c to mobilize. Continue to recommend SNF post acute to allow pt to return to baseline functional mobility and prevent future falls. Continue PT in acute setting    Follow Up Recommendations  SNF     Equipment Recommendations  None recommended by PT    Recommendations for Other Services       Precautions / Restrictions Precautions Precautions: Fall Precaution Comments: R sided weakness Restrictions Weight Bearing Restrictions: No    Mobility  Bed Mobility Overal bed mobility: Young Assistance Bed Mobility: Supine to Sit     Supine to sit: Min guard     General bed mobility comments: in chair  Transfers Overall transfer level: Young assistance Equipment used: Hemi-walker Transfers: Sit to/from Stand Sit to Stand: Min assist;+2 safety/equipment   Squat pivot transfers: Min assist     General transfer comment: pt uses momentum to come to stand, min assist to rise and steady for transition to hemiwalker  Ambulation/Gait             General Gait Details: attempted, unable to wt shift to RLE and lock knee in extension (d/t  pain) to take step   Stairs             Wheelchair Mobility    Modified Rankin (Stroke Patients Only)       Balance Overall balance assessment: Young assistance Sitting-balance support: Feet supported;Single extremity supported;No upper extremity supported Sitting balance-Leahy Scale: Fair       Standing balance-Leahy Scale: Poor                              Cognition Arousal/Alertness: Awake/alert Behavior During Therapy: WFL for tasks assessed/performed Overall Cognitive Status: Within Functional Limits for tasks assessed                                 General Comments: overall appropriate, oriented x3, follows one step commands consistently      Exercises      General Comments        Pertinent Vitals/Pain Pain Assessment: Faces Faces Pain Scale: Hurts little more Pain Location: right knee with WBing Pain Descriptors / Indicators: Grimacing;Guarding Pain Intervention(s): Limited activity within patient's tolerance;Monitored during session;Repositioned;Ice applied (instructed pt to remove after 15-20 min d/t decr sensation)    Home Living Family/patient expects to be discharged to:: Private residence Living Arrangements: Other relatives (sister) Available Help at Discharge: Family;Available PRN/intermittently Type of Home: House Home Access: Level entry (threshold)   Home Layout: One level Home Equipment: Wheelchair - manual;Cane - quad;Grab bars -  toilet;Tub bench      Prior Function Level of Independence: Young assistance  Gait / Transfers Assistance Needed: pt and sister report he uses LBQC to amb ~ 10' to get into bedroom and bathroom. (previous notes state pt used hemiwalker, will clarify) ADL's / Homemaking Assistance Needed: sister assists with tub transfers, dressing     PT Goals (current goals can now be found in the care plan section) Acute Rehab PT Goals Patient Stated Goal: to return to being able to walk  short distances PT Goal Formulation: With patient Time For Goal Achievement: 07/28/20 Potential to Achieve Goals: Good Progress towards PT goals: Progressing toward goals    Frequency    Min 2X/week      PT Plan Current plan remains appropriate    Co-evaluation              AM-PAC PT "6 Clicks" Mobility   Outcome Measure  Help needed turning from your back to your side while in a flat bed without using bedrails?: A Little Help needed moving from lying on your back to sitting on the side of a flat bed without using bedrails?: A Little Help needed moving to and from a bed to a chair (including a wheelchair)?: A Little Help needed standing up from a chair using your arms (e.g., wheelchair or bedside chair)?: A Little Help needed to walk in hospital room?: Total Help needed climbing 3-5 steps with a railing? : Total 6 Click Score: 14    End of Session Equipment Utilized During Treatment: Gait belt Activity Tolerance: Patient tolerated treatment well Patient left: in chair;with call bell/phone within reach;with chair alarm set;with family/visitor present Nurse Communication: Mobility status PT Visit Diagnosis: Other abnormalities of gait and mobility (R26.89)     Time: 0051-1021 PT Time Calculation (min) (ACUTE ONLY): 16 min  Charges:  $Therapeutic Activity: 8-22 mins                     Baxter Flattery, PT  Acute Rehab Dept ([place]) [phone] Pager [phone]  07/14/2020    [facility] 07/14/2020, 3:48 PM

## 2020-07-14 NOTE — TOC Initial Note (Signed)
Transition of Care [facility]) - Initial/Assessment Note    Patient Details  Name: Eduardo Young MRN: [id number] Date of Birth: 08-Feb-1956  Transition of Care [facility]) CM/SW Contact:    Lia Hopping, [place] Phone Number: 07/14/2020, 5:04 PM  Clinical Narrative:      Patient has past medical history of hemorrhagic stroke with right sided weakness. Patient admitted after a fall and right lower extremity pain.           CSW met with the patient and his sister Manuela Schwartz at bedside to discuss rehab placement. The patient reports he lives with his sister and was ambulatory with short distances to his wheelchair. The patient reports difficulty with standing due to leg pain and no his inability to walk short distances. Patient hopes going to rehab will allow him to regain his strength. Patient sister usually  Provides some support with ADL's when patient needs assistance.  CSW explain SNF process and will follow up with bed offers. CSW explain [facility] medicare authorization requirement to determine rehab benefits approval. Patient and his sister report understanding.  [place] initiated Ref# [id number], requested clinical faxed for review.   Patient vaccinated.   CSW gave bed offers and informed sister to reference [email or web address].   TOC staff will follow up with sister in the am for SNF choice.  Covid test completed 12/22.  Expected Discharge Plan: Skilled Nursing Facility Barriers to Discharge: Continued Medical Work up,Insurance Authorization   Patient Goals and CMS Choice Patient states their goals for this hospitalization and ongoing recovery are:: go to rehab so that I can walk short distance and pivot to my wheel chair CMS Medicare.gov Compare Post Acute Care list provided to:: Patient Choice offered to / list presented to : [place]  Expected Discharge Plan and Services Expected Discharge Plan: [place] In-house Referral: Clinical Social Work   Post Acute Care Choice:  [place] Living arrangements for the past 2 months: [place]                                      Prior Living Arrangements/Services Living arrangements for the past 2 months: [place] Lives with:: Siblings Patient language and need for interpreter reviewed:: Yes Do you feel safe going back to the place where you live?: Yes      Need for Family Participation in Patient Care: Yes (Comment) Care giver support system in place?: Yes (comment) Current home services: DME Criminal Activity/Legal Involvement Pertinent to Current Situation/Hospitalization: No - Comment as needed  Activities of Daily Living Home Assistive Devices/Equipment: Environmental consultant (specify type) (four prong) ADL Screening (condition at time of admission) Patient's cognitive ability adequate to safely complete daily activities?: No Is the patient deaf or have difficulty hearing?: No Does the patient have difficulty seeing, even when wearing glasses/contacts?: No Does the patient have difficulty concentrating, remembering, or making decisions?: No Patient able to express need for assistance with ADLs?: Yes Does the patient have difficulty dressing or bathing?: Yes Independently performs ADLs?: No Communication: Needs assistance Is this a change from baseline?: Pre-admission baseline Dressing (OT): Needs assistance Is this a change from baseline?: Pre-admission baseline Grooming: Needs assistance Is this a change from baseline?: Pre-admission baseline Feeding: Independent Bathing: Needs assistance Is this a change from baseline?: Pre-admission baseline Toileting: Needs assistance Is this a change from baseline?: Pre-admission baseline In/Out Bed: Needs assistance Is this a  change from baseline?: Pre-admission baseline Walks in Home: Independent with device (comment) Does the patient have difficulty walking or climbing stairs?: Yes Weakness of Legs: Both Weakness of  Arms/Hands: Right  Permission Sought/Granted Permission sought to share information with : Facility Contact Representative,Family Supports Permission granted to share information with : Yes, Verbal Permission Granted  Share Information with NAME: Blunck,Susan Sister  Permission granted to share info w AGENCY: SNF's in the area  Permission granted to share info w Relationship: Sister  Permission granted to share info w Contact Information: [phone]  [phone]  Emotional Assessment Appearance:: Appears stated age Attitude/Demeanor/Rapport: Engaged Affect (typically observed): Accepting Orientation: : Oriented to Self,Oriented to Place,Oriented to  Time,Oriented to Situation Alcohol / Substance Use: Not Applicable Psych Involvement: No (comment)  Admission diagnosis:  Pneumonia [J18.9] Right leg pain [M79.604] Right hip pain [M25.551] Fall, initial encounter [W19.XXXA] Patient Active Problem List   Diagnosis Date Noted  . Mixed diabetic hyperlipidemia associated with type 2 diabetes mellitus ([place]) 07/14/2020  . Fall at home, initial encounter 07/14/2020  . [place] intertrigo 07/14/2020  . Right hip pain 07/13/2020  . Pain in right hip 10/14/2019  . Rectal bleeding   . Segmental colitis with complication ([place])   . Adjustment disorder with mixed anxiety and depressed mood   . Benign essential HTN   . Type 2 diabetes mellitus with stage 3a chronic kidney disease, with long-term current use of insulin ([place])   . Chronic kidney disease, stage 3a ([place])   . Spastic hemiparesis of right dominant side ([place]) 09/08/2016  . Weakness   . Myoclonus   . Seizures ([place])   . TIA (transient ischemic attack) 09/07/2016  . Chronic venous insufficiency   . History of cerebral infarction ([place]) 10/21/2010  . DM (diabetes mellitus) ([place]) 10/21/2010  . HLD (hyperlipidemia) 10/21/2010  . Essential hypertension 10/21/2010  . OSA (obstructive sleep apnea) 10/21/2010   PCP:  Susy Frizzle,  MD Pharmacy:   [facility], [place] [place] [address] Phone: [phone] Fax: [phone]  [facility] [place] [facility] [place] [facility] [address] [employer] [address] [place] [address] Phone: [phone] Fax: [phone]     Social Determinants of Health (SDOH) Interventions    Readmission Risk Interventions No flowsheet data found.

## 2020-07-14 NOTE — ED Notes (Signed)
Covid swab results have not resulted. Lab stated they didn't have specimen. Specimen obtained again now

## 2020-07-14 NOTE — Evaluation (Signed)
Physical Therapy Evaluation Patient Details Name: Eduardo Young MRN: [id number] DOB: 15-Sep-1955 Today's Date: 07/14/2020   History of Present Illness  64 year old male with past medical history of hemorrhagic stroke with right-sided weakness, insulin-dependent diabetes mellitus type 2, hyperlipidemia, hypertension, obstructive sleep apnea (on cpap), gout and chronic kidney disease stage IIIa who presents to [facility] emergency department with complaints of right lower extremity pain status post fall. xray questionable for hip fx, CT negative  Clinical Impression  Pt admitted with above diagnosis.  Pt able to transfer to chair with ~ min assist. Pt and sister concerned about him being able to walk short distances at home as he does at baseline.  He is unable to amb at this time d/t pain, will continue efforts to see pt and work on standing/amb. Recommend SNF vs HHPT pending progress and LOS.   Pt currently with functional limitations due to the deficits listed below (see PT Problem List). Pt will benefit from skilled PT to increase their independence and safety with mobility to allow discharge to the venue listed below.       Follow Up Recommendations SNF (vs HHPT pending progress)    Equipment Recommendations  None recommended by PT    Recommendations for Other Services       Precautions / Restrictions Precautions Precautions: Fall Precaution Comments: R sided weakness Restrictions Weight Bearing Restrictions: No      Mobility  Bed Mobility Overal bed mobility: Needs Assistance Bed Mobility: Supine to Sit     Supine to sit: Min guard     General bed mobility comments: 2 attempts to come to sit, foot board/length of bed shortened so pt could place L hand on foot board to simulate home    Transfers Overall transfer level: Needs assistance   Transfers: Squat Pivot Transfers     Squat pivot transfers: Min assist     General transfer comment: assist to  guide hips to chair. bed to recliner transfer to pt L side  Ambulation/Gait             General Gait Details: unable d/t pain  Stairs            Wheelchair Mobility    Modified Rankin (Stroke Patients Only)       Balance Overall balance assessment: Needs assistance Sitting-balance support: Feet supported;Single extremity supported;No upper extremity supported Sitting balance-Leahy Scale: Fair       Standing balance-Leahy Scale: Zero                               Pertinent Vitals/Pain Pain Assessment: Faces Faces Pain Scale: Hurts little more Pain Location: right hip with movement Pain Descriptors / Indicators: Discomfort Pain Intervention(s): Limited activity within patient's tolerance;Monitored during session;Premedicated before session;Repositioned    Home Living Family/patient expects to be discharged to:: Private residence Living Arrangements: Other relatives (sister) Available Help at Discharge: Family;Available PRN/intermittently Type of Home: House Home Access: Level entry (threshold)     Home Layout: One level Home Equipment: Wheelchair - manual;Cane - quad;Grab bars - toilet;Tub bench      Prior Function Level of Independence: Needs assistance   Gait / Transfers Assistance Needed: pt and sister report he uses LBQC to amb ~ 10' to get into bedroom and bathroom. (previous notes state pt used hemiwalker, will clarify)  ADL's / Homemaking Assistance Needed: sister assists with tub transfers, dressing  Hand Dominance   Dominant Hand: Left    Extremity/Trunk Assessment   Upper Extremity Assessment Upper Extremity Assessment: Defer to OT evaluation;RUE deficits/detail RUE Deficits / Details: no functional use RUE per pt report    Lower Extremity Assessment Lower Extremity Assessment: RLE deficits/detail RLE Deficits / Details: baseline weakness d/t previous CVA RLE: Unable to fully assess due to pain        Communication   Communication: Expressive difficulties  Cognition Arousal/Alertness: Awake/alert Behavior During Therapy: WFL for tasks assessed/performed Overall Cognitive Status: Within Functional Limits for tasks assessed                                 General Comments: overall appropriate, oriented x3, follows one step commands consistently      General Comments      Exercises     Assessment/Plan    PT Assessment Patient needs continued PT services  PT Problem List Decreased strength;Decreased activity tolerance;Decreased mobility;Decreased coordination;Decreased balance;Decreased range of motion       PT Treatment Interventions DME instruction;Therapeutic activities;Gait training;Functional mobility training;Therapeutic exercise;Patient/family education;Balance training    PT Goals (Current goals can be found in the Care Plan section)  Acute Rehab PT Goals Patient Stated Goal: to return to being able to walk short distances PT Goal Formulation: With patient Time For Goal Achievement: 07/28/20 Potential to Achieve Goals: Good    Frequency Min 2X/week   Barriers to discharge        Co-evaluation               AM-PAC PT "6 Clicks" Mobility  Outcome Measure Help needed turning from your back to your side while in a flat bed without using bedrails?: A Little Help needed moving from lying on your back to sitting on the side of a flat bed without using bedrails?: A Little Help needed moving to and from a bed to a chair (including a wheelchair)?: A Little Help needed standing up from a chair using your arms (e.g., wheelchair or bedside chair)?: Total Help needed to walk in hospital room?: Total Help needed climbing 3-5 steps with a railing? : Total 6 Click Score: 12    End of Session Equipment Utilized During Treatment: Gait belt Activity Tolerance: Patient tolerated treatment well Patient left: with call bell/phone within reach;in chair;with  chair alarm set Nurse Communication: Mobility status PT Visit Diagnosis: Other abnormalities of gait and mobility (R26.89)    Time: 4827-0786 PT Time Calculation (min) (ACUTE ONLY): 30 min   Charges:   PT Evaluation $PT Eval Low Complexity: 1 Low PT Treatments $Therapeutic Activity: 8-22 mins        Baxter Flattery, PT  Acute Rehab Dept ([place]) [phone] Pager [phone]  07/14/2020   [facility] 07/14/2020, 12:51 PM

## 2020-07-14 NOTE — Progress Notes (Addendum)
  PROGRESS NOTE  Patient admitted earlier this morning. See H&P.   Patient admitted after a fall at home.  Imaging has been negative for fracture of the right hip.  Continues to have significant pain this morning.  PT OT is pending.  He states that he is normally nonambulatory, transfers to and from wheelchair.  Replace potassium. DM coordinator consulted due to uncontrolled DM with Ha1c > 14.   Discussed with sister over the phone this afternoon.   Status is: Observation  The patient remains OBS appropriate and will d/c before 2 midnights.  Dispo: The patient is from: Home              Anticipated d/c is to: TBD              Anticipated d/c date is: 1 day              Patient currently is not medically stable to d/c.        Dessa Phi, DO [facility] 07/14/2020, 11:37 AM  Available via Epic secure chat 7am-7pm After these hours, please refer to coverage provider listed on [email or web address]

## 2020-07-14 NOTE — Progress Notes (Signed)
Inpatient Diabetes Program Recommendations  AACE/ADA: New Consensus Statement on Inpatient Glycemic Control (2015)  Target Ranges:  Prepandial:   less than 140 mg/dL      Peak postprandial:   less than 180 mg/dL (1-2 hours)      Critically ill patients:  140 - 180 mg/dL   Lab Results  Component Value Date   GLUCAP 289 (H) 07/14/2020   HGBA1C >14.0 (H) 07/13/2020    Review of Glycemic Control  Diabetes history: DM2 Outpatient Diabetes medications: Basaglar 45 units QHS, Humalog 7 units ac Dinner Current orders for Inpatient glycemic control: Lantus 45 units QD, Novolog 0-20 units tidwc and 0-5 units QHS + 7 units ac Dinner, tradjenta 5 mg QD  HgbA1C - >14%  Inpatient Diabetes Program Recommendations:     Increase Lantus to 50 units QD Add Novolog 5 units at breakfast and lunch meal if eating > 50% meal Continue Novolog 7 units at dinner meal  Spoke with pt and sister on phone regarding HgbA1C of > 14%. Sister states pt misses Humalog 7 units before dinner meal most of the time d/t forgetting, being out to dinner and not having insulin pen with them. Pt has no use of R hand and therefore does not monitor blood sugars at home. Pt's sister lives close by and has Lantus insulin pen ready for him to inject each night. Sister states she will start checking his blood sugars more frequently as she doesn't work and is available most of the time. Sister states they had spoke to PCP about a Elenor Legato, but MD did not care for it. At last PCP appt, MD told pt to increase Lantus to 60 units QHS. Has not done this yet. Needs a lot of support with controlling his blood sugars since having stroke. Explained that HgbA1C of > 14% means average blood sugar is > 360 mg/dL. Will need to decrease HgbA1C to approx 8% to reduce risk of complications from diabetes. Pt will f/u with PCP after d/c.  Continue to follow.  Thank you. Lorenda Peck, RD, LDN, CDE Inpatient Diabetes Coordinator [phone]

## 2020-07-14 NOTE — NC FL2 (Addendum)
[place] LEVEL OF CARE SCREENING TOOL     IDENTIFICATION  Patient Name: Eduardo Young Birthdate: May 06, 1956 Sex: male Admission Date (Current Location): 07/13/2020  [facility] and [place] Number:  Herbalist and Address:  [facility],  [place], [place]      Provider Number: [id number]  Attending Physician Name and Address:  Dessa Phi, DO  Relative Name and Phone Number:  Horace, Lukas [phone]  [phone]    Current Level of Care: Hospital Recommended Level of Care: [place] Prior Approval Number:    Date Approved/Denied:   PASRR Number: [id number]  Discharge Plan: SNF    Current Diagnoses: Patient Active Problem List   Diagnosis Date Noted  . Mixed diabetic hyperlipidemia associated with type 2 diabetes mellitus ([place]) 07/14/2020  . Fall at home, initial encounter 07/14/2020  . [place] intertrigo 07/14/2020  . Right hip pain 07/13/2020  . Pain in right hip 10/14/2019  . Rectal bleeding   . Segmental colitis with complication ([place])   . Adjustment disorder with mixed anxiety and depressed mood   . Benign essential HTN   . Type 2 diabetes mellitus with stage 3a chronic kidney disease, with long-term current use of insulin ([place])   . Chronic kidney disease, stage 3a ([place])   . Spastic hemiparesis of right dominant side ([place]) 09/08/2016  . Weakness   . Myoclonus   . Seizures ([place])   . TIA (transient ischemic attack) 09/07/2016  . Chronic venous insufficiency   . History of cerebral infarction ([place]) 10/21/2010  . DM (diabetes mellitus) ([place]) 10/21/2010  . HLD (hyperlipidemia) 10/21/2010  . Essential hypertension 10/21/2010  . OSA (obstructive sleep apnea) 10/21/2010    Orientation RESPIRATION BLADDER Height & Weight     Self,Time,Situation,Place  Normal Continent Weight: 288 lb 9.3 oz (130.9 kg) Height:  6' 4"  (193 cm)  BEHAVIORAL SYMPTOMS/MOOD NEUROLOGICAL BOWEL NUTRITION  STATUS      Continent Diet (Regular)  AMBULATORY STATUS COMMUNICATION OF NEEDS Skin   Extensive Assist Verbally Skin abrasions,Bruising                       Personal Care Assistance Level of Assistance  Bathing,Feeding,Dressing Bathing Assistance: Limited assistance Feeding assistance: Independent Dressing Assistance: Maximum assistance     Functional Limitations Info  Sight,Hearing,Speech Sight Info: Adequate Hearing Info: Adequate Speech Info: Adequate    SPECIAL CARE FACTORS FREQUENCY  PT (By licensed PT),OT (By licensed OT)     PT Frequency: 5x/week OT Frequency: 5x/week            Contractures Contractures Info: Not present    Additional Factors Info  Code Status,Allergies,Psychotropic,Insulin Sliding Scale Code Status Info: fullcode Allergies Info: Allergies: No Known Allergies           Current Medications (07/15/2020):  This is the current hospital active medication list Current Facility-Administered Medications  Medication Dose Route Frequency Provider Last Rate Last Admin  . acetaminophen (TYLENOL) tablet 650 mg  650 mg Oral Q6H PRN Vernelle Emerald, MD   650 mg at 07/15/20 0756   Or  . acetaminophen (TYLENOL) suppository 650 mg  650 mg Rectal Q6H PRN Vernelle Emerald, MD      . allopurinol (ZYLOPRIM) tablet 100 mg  100 mg Oral BID Vernelle Emerald, MD   100 mg at 07/14/20 2149  . amLODipine (NORVASC) tablet 10 mg  10 mg Oral Daily Shalhoub, Sherryll Burger, MD   10  mg at 07/14/20 0925  . aspirin EC tablet 81 mg  81 mg Oral QPM Vernelle Emerald, MD   81 mg at 07/14/20 1733  . atorvastatin (LIPITOR) tablet 40 mg  40 mg Oral Daily Shalhoub, Sherryll Burger, MD   40 mg at 07/14/20 0931  . baclofen (LIORESAL) tablet 5-10 mg  5-10 mg Oral TID PRN Vernelle Emerald, MD      . bisacodyl (DULCOLAX) EC tablet 5 mg  5 mg Oral Daily PRN Shalhoub, Sherryll Burger, MD      . cloNIDine (CATAPRES) tablet 0.2 mg  0.2 mg Oral BID Vernelle Emerald, MD   0.2 mg at 07/14/20  2146  . enoxaparin (LOVENOX) injection 60 mg  60 mg Subcutaneous Q24H Shalhoub, Sherryll Burger, MD   60 mg at 07/14/20 3545  . hydrALAZINE (APRESOLINE) injection 10 mg  10 mg Intravenous Q6H PRN Vernelle Emerald, MD      . hydrALAZINE (APRESOLINE) tablet 50 mg  50 mg Oral TID Vernelle Emerald, MD   50 mg at 07/14/20 2146  . insulin aspart (novoLOG) injection 0-20 Units  0-20 Units Subcutaneous TID AC & HS Shalhoub, Sherryll Burger, MD   4 Units at 07/15/20 0750  . insulin aspart (novoLOG) injection 5 Units  5 Units Subcutaneous BID WC Dessa Phi, DO   5 Units at 07/15/20 0749  . insulin aspart (novoLOG) injection 7 Units  7 Units Subcutaneous Q supper Shalhoub, Sherryll Burger, MD   7 Units at 07/14/20 1733  . insulin glargine (LANTUS) injection 50 Units  50 Units Subcutaneous Daily Dessa Phi, DO      . labetalol (NORMODYNE) tablet 300 mg  300 mg Oral BID Vernelle Emerald, MD   300 mg at 07/14/20 2146  . lisinopril (ZESTRIL) tablet 20 mg  20 mg Oral BID Vernelle Emerald, MD   20 mg at 07/14/20 2147  . oxyCODONE-acetaminophen (PERCOCET/ROXICET) 5-325 MG per tablet 1 tablet  1 tablet Oral Q4H PRN Shalhoub, Sherryll Burger, MD   1 tablet at 07/15/20 0756   Or  . morphine 2 MG/ML injection 2 mg  2 mg Intravenous Q4H PRN Vernelle Emerald, MD      . nystatin (MYCOSTATIN/NYSTOP) topical powder   Topical BID Cyd Silence Sherryll Burger, MD   Given at 07/14/20 2151  . ondansetron (ZOFRAN) tablet 4 mg  4 mg Oral Q6H PRN Shalhoub, Sherryll Burger, MD       Or  . ondansetron [facility]) injection 4 mg  4 mg Intravenous Q6H PRN Shalhoub, Sherryll Burger, MD      . polyethylene glycol (MIRALAX / GLYCOLAX) packet 17 g  17 g Oral Daily PRN Shalhoub, Sherryll Burger, MD      . potassium chloride SA (KLOR-CON) CR tablet 40 mEq  40 mEq Oral Q4H Dessa Phi, DO   40 mEq at 07/15/20 6256     Discharge Medications: Please see discharge summary for a list of discharge medications.  Relevant Imaging Results:  Relevant Lab Results:   Additional  Information [id number]  Nera Haworth, LCSW

## 2020-07-14 NOTE — ED Notes (Signed)
Pt wife Hafiz Irion) will like a call when he get a bed [phone])

## 2020-07-14 NOTE — H&P (Addendum)
History and Physical    Eduardo Young FTD:[id number] DOB: 19-Oct-1955 DOA: 07/13/2020  PCP: Susy Frizzle, MD  Patient coming from: Home   Chief Complaint:  Chief Complaint  Patient presents with  . Leg Pain  . Hyperglycemia  . Fall     HPI:    64 year old male with past medical history of hemorrhagic stroke with right-sided weakness, insulin-dependent diabetes mellitus type 2, hyperlipidemia, hypertension, obstructive sleep apnea (on cpap), gout and chronic kidney disease stage IIIa who presents to [facility] emergency department with complaints of right lower extremity pain status post fall.  Patient explains that at approximately 3 AM this morning he was walking in his bedroom when he was attempting to pivot into bed.  He accidentally fell, striking his head on the dresser and landing on his right hip.  He suddenly began to experience severe right hip and right lower extremity pain.  Patient describes the pain as 10 out of 10 in intensity, throbbing in quality, radiating from the right hip down to the right foot.  Patient states pain is worse with movement of the right lower extremity and improved with rest.  Patient states the pain continued to persist for nearly an hour and at that point EMS was contacted.    The patient was then brought into [facility] emergency department for evaluation.  Upon evaluation in the emergency department patient was found to have severe pain requiring intravenous opiate-based analgesics for pain control.  Attempts were made to get the patient to ambulate and were unsuccessful.  Initial x-ray of the right hip revealed a questionable band of sclerosis around the femoral neck that the radiologist thought could possibly be evidence of an underlying fracture.  Patient was then sent for CT imaging of the right hip that ended up being negative for fracture.  Due to patient's ongoing hip pain and inability to ambulate the hospitalist group was  called to assess the patient for admission the hospital.  Review of Systems:   Review of Systems  Musculoskeletal: Positive for falls, joint pain and myalgias.    Past Medical History:  Diagnosis Date  . Chronic venous insufficiency   . Diabetes mellitus   . GERD (gastroesophageal reflux disease)   . Gout   . Hemiparesis ([place])   . Hyperlipidemia   . Hypertension   . Obesity   . Renal calculi   . Sleep apnea, obstructive   . Stroke [facility])    [id number]    Past Surgical History:  Procedure Laterality Date  . BIOPSY  09/06/2018   Procedure: BIOPSY;  Surgeon: Jerene Bears, MD;  Location: [facility];  Service: Gastroenterology;;  . COLONOSCOPY WITH PROPOFOL N/A 09/06/2018   Procedure: COLONOSCOPY WITH PROPOFOL;  Surgeon: Jerene Bears, MD;  Location: [facility];  Service: Gastroenterology;  Laterality: N/A;  . LITHOTRIPSY    . NASAL POLYP EXCISION       reports that he has quit smoking. He has never used smokeless tobacco. He reports current alcohol use. He reports previous drug use. Drugs: Cocaine and Marijuana.  No Known Allergies  Family History  Problem Relation Age of Onset  . Heart attack Mother   . Hypertension Mother   . Hyperlipidemia Mother   . Hypertension Father   . Hyperlipidemia Father   . CVA Father   . Heart attack Sister   . Lung cancer Brother   . Heart attack Brother   . Ovarian cancer Sister   . Heart  attack Sister   . CVA Sister      Prior to Admission medications   Medication Sig Start Date End Date Taking? Authorizing Provider  allopurinol (ZYLOPRIM) 100 MG tablet Take 1 tablet by mouth twice daily Patient taking differently: Take 100 mg by mouth 2 (two) times daily. 01/29/20  Yes Susy Frizzle, MD  amLODipine (NORVASC) 10 MG tablet Take 1 tablet by mouth once daily Patient taking differently: Take 10 mg by mouth daily. 06/11/20  Yes Susy Frizzle, MD  aspirin 81 MG tablet Take 81 mg by mouth every evening.    Yes [provider]  atorvastatin (LIPITOR) 40 MG tablet Take 1 tablet (40 mg total) by mouth daily. 01/07/20  Yes Susy Frizzle, MD  baclofen (LIORESAL) 10 MG tablet Take 0.5-1 tablets (5-10 mg total) by mouth 3 (three) times daily as needed for muscle spasms. 06/22/20  Yes Hilts, Legrand Como, MD  cloNIDine (CATAPRES) 0.2 MG tablet Take 1 tablet by mouth twice daily Patient taking differently: Take 0.2 mg by mouth 2 (two) times daily. 06/11/20  Yes Susy Frizzle, MD  doxazosin (CARDURA) 4 MG tablet Take 1 tablet by mouth once daily Patient taking differently: Take 4 mg by mouth daily. 01/29/20  Yes Susy Frizzle, MD  hydrALAZINE (APRESOLINE) 25 MG tablet Take 2 tablets (50 mg total) by mouth 3 (three) times daily. 01/07/20  Yes Susy Frizzle, MD  hydrochlorothiazide (HYDRODIURIL) 25 MG tablet Take 1 tablet (25 mg total) by mouth daily. 01/07/20  Yes Susy Frizzle, MD  Insulin Glargine [facility] KWIKPEN) 100 UNIT/ML Inject 45 Units into the skin at bedtime.   Yes Susy Frizzle, MD  insulin lispro (HUMALOG) 100 UNIT/ML KwikPen INJECT 7 UNITS SUBCUTANEOUSLY ONCE DAILY Patient taking differently: Inject 7 Units into the skin every evening. 30 minutes before dinner 12/10/19  Yes Pickard, Cammie Mcgee, MD  labetalol (NORMODYNE) 300 MG tablet Take 1 tablet by mouth twice daily Patient taking differently: Take 300 mg by mouth 2 (two) times daily. 01/29/20  Yes Susy Frizzle, MD  lisinopril (ZESTRIL) 20 MG tablet Take 1 tablet by mouth twice daily Patient taking differently: Take 20 mg by mouth in the morning and at bedtime. 01/29/20  Yes Susy Frizzle, MD  Multiple Vitamin (MULTIVITAMIN WITH MINERALS) TABS tablet Take 1 tablet by mouth daily.   Yes [provider]  ACCU-CHEK FASTCLIX LANCETS MISC Check BS BID DX - E11.9 09/04/18   Susy Frizzle, MD  bisacodyl (BISACODYL) 5 MG EC tablet Take 1 tablet (5 mg total) by mouth daily as needed for moderate constipation. 09/17/18   Susy Frizzle, MD  Blood Glucose Monitoring Suppl (ACCU-CHEK AVIVA PLUS) w/Device KIT Check BS BID DX - E11.9 09/04/18   Susy Frizzle, MD  glucose blood (ACCU-CHEK AVIVA) test strip Check BS BID DX - E11.9 Patient not taking: No sig reported 09/04/18   Susy Frizzle, MD  HYDROcodone-acetaminophen (NORCO) 5-325 MG tablet Take 1 tablet by mouth every 6 (six) hours as needed for moderate pain. Patient not taking: Reported on 07/14/2020 10/06/19   Susy Frizzle, MD  Lancets Misc. (ACCU-CHEK FASTCLIX LANCET) KIT Check BS BID DX - E11.9 09/04/18   Susy Frizzle, MD  LANTUS SOLOSTAR 100 UNIT/ML Solostar Pen INJECT 45 UNITS SUBCUTANEOUSLY ONCE DAILY 03/25/20   Susy Frizzle, MD  linaclotide [facility]) 145 MCG CAPS capsule Take 1 capsule (145 mcg total) by mouth daily before breakfast. Patient not taking:  Reported on 07/14/2020 09/17/18   Susy Frizzle, MD  meloxicam (MOBIC) 7.5 MG tablet Take 1 tablet (7.5 mg total) by mouth 2 (two) times daily as needed for pain. 10/14/19   Leandrew Koyanagi, MD  polyethylene glycol powder (GLYCOLAX/MIRALAX) powder Take 17 g by mouth daily. 09/06/18   Pyrtle, Lajuan Lines, MD  potassium chloride SA (K-DUR) 20 MEQ tablet Take 1 tablet (20 mEq total) by mouth daily. Patient not taking: Reported on 07/14/2020 04/09/19   Susy Frizzle, MD  TRADJENTA 5 MG TABS tablet Take 1 tablet by mouth once daily 01/06/19   Susy Frizzle, MD  traMADol (ULTRAM) 50 MG tablet Take 1 tablet (50 mg total) by mouth every 6 (six) hours as needed. Patient not taking: Reported on 07/14/2020 06/22/20   Eunice Blase, MD    Physical Exam: Vitals:   07/14/20 0500 07/14/20 0521 07/14/20 0543 07/14/20 0549  BP: 135/62  (!) 154/89   Pulse: (!) 54  (!) 57   Resp: 20  18   Temp:  98 F (36.7 C) 98.5 F (36.9 C)   TempSrc:  Oral    SpO2: 96%  95%   Weight:    130.9 kg  Height:    6' 4"  (1.93 m)    Constitutional: Acute alert and oriented x3, patient is in distress due to pain. Skin:  Notable significant hyperemia and maceration of the skin of the right axilla.   good skin turgor noted. Eyes: Pupils are equally reactive to light.  No evidence of scleral icterus or conjunctival pallor.  ENMT: Dry mucous membranes noted.  Posterior pharynx clear of any exudate or lesions.   Neck: normal, supple, no masses, no thyromegaly.  No evidence of jugular venous distension.   Respiratory: clear to auscultation bilaterally, no wheezing, no crackles. Normal respiratory effort. No accessory muscle use.  Cardiovascular: Regular rate and rhythm, no murmurs / rubs / gallops.  Notable edema of the right upper and right lower extremity.  2+ pedal pulses. No carotid bruits.  Chest:   Nontender without crepitus or deformity.   Back:   Nontender without crepitus or deformity. Abdomen: Abdomen is soft and nontender.  No evidence of intra-abdominal masses.  Positive bowel sounds noted in all quadrants.   Musculoskeletal: No joint deformity upper and lower extremities. Good ROM, notable contractures of the right hand.  Normal muscle tone.  Neurologic: Patient exhibits flaccid paralysis of the right upper extremity and 2 out of 5 strength of the right lower extremity both proximal and distal muscle groups with evidence of contractures particularly of the right hand.  CN 2-12 grossly intact. Sensation intact.   Patient is following all commands.  Patient is responsive to verbal stimuli.   Psychiatric: Patient exhibits normal mood with flat affect.  Patient seems to possess insight as to their current situation.     Labs on Admission: I have personally reviewed following labs and imaging studies -   CBC: Recent Labs  Lab 07/13/20 1429 07/13/20 1657 07/14/20 0803  WBC 10.2 10.6* 8.0  NEUTROABS 7,630  --  5.0  HGB 13.3 13.3 12.2*  HCT 38.7 40.0 37.3*  MCV 86.6 87.7 89.4  PLT 227 228 825   Basic Metabolic Panel: Recent Labs  Lab 07/13/20 1429 07/13/20 1657 07/14/20 0803  NA 140 135 137  K  3.4* 3.2* 2.7*  CL 97* 97* 98  CO2 30 26 26   GLUCOSE 316* 387* 270*  BUN 18 20 21   CREATININE 1.65* 1.77*  1.67*  CALCIUM 8.9 8.7* 8.1*  MG  --   --  1.8   GFR: Estimated Creatinine Clearance: 67.7 mL/min (A) (by C-G formula based on SCr of 1.67 mg/dL (H)). Liver Function Tests: Recent Labs  Lab 07/13/20 1429 07/14/20 0803  AST 15 15  ALT 19 22  ALKPHOS  --  100  BILITOT 0.6 0.8  PROT 6.0* 6.1*  ALBUMIN  --  3.3*   No results for input(s): LIPASE, AMYLASE in the last 168 hours. No results for input(s): AMMONIA in the last 168 hours. Coagulation Profile: No results for input(s): INR, PROTIME in the last 168 hours. Cardiac Enzymes: No results for input(s): CKTOTAL, CKMB, CKMBINDEX, TROPONINI in the last 168 hours. BNP (last 3 results) No results for input(s): PROBNP in the last 8760 hours. HbA1C: Recent Labs    07/13/20 1429  HGBA1C >14.0*   CBG: Recent Labs  Lab 07/13/20 1620 07/13/20 2229 07/14/20 0734  GLUCAP 336* 330* 238*   Lipid Profile: No results for input(s): CHOL, HDL, LDLCALC, TRIG, CHOLHDL, LDLDIRECT in the last 72 hours. Thyroid Function Tests: No results for input(s): TSH, T4TOTAL, FREET4, T3FREE, THYROIDAB in the last 72 hours. Anemia Panel: No results for input(s): VITAMINB12, FOLATE, FERRITIN, TIBC, IRON, RETICCTPCT in the last 72 hours. Urine analysis:    Component Value Date/Time   COLORURINE YELLOW 07/14/2020 [place] 07/14/2020 0755   LABSPEC 1.014 07/14/2020 0755   PHURINE 5.0 07/14/2020 0755   GLUCOSEU >=500 (A) 07/14/2020 0755   HGBUR NEGATIVE 07/14/2020 0755   BILIRUBINUR NEGATIVE 07/14/2020 0755   KETONESUR NEGATIVE 07/14/2020 0755   PROTEINUR 100 (A) 07/14/2020 0755   UROBILINOGEN 1.0 11/15/2013 0952   NITRITE NEGATIVE 07/14/2020 0755   LEUKOCYTESUR NEGATIVE 07/14/2020 0755    Radiological Exams on Admission - Personally Reviewed: DG Chest 1 View  Result Date: 07/14/2020 CLINICAL DATA:  Pneumonia EXAM:  CHEST  1 VIEW COMPARISON:  09/08/2016 FINDINGS: Low volume chest with asymmetric airspace opacity at the base and about the left hilum. Prominent heart size and vascular pedicle thickness, accentuated by rotation. No visible effusion or cavitation. IMPRESSION: Asymmetric opacity on the left correlating with history of pneumonia. Recommend follow-up to clearing. Electronically Signed   By: Monte Fantasia M.D.   On: 07/14/2020 05:35   CT Head Wo Contrast  Result Date: 07/13/2020 CLINICAL DATA:  Fall EXAM: CT HEAD WITHOUT CONTRAST TECHNIQUE: Contiguous axial images were obtained from the base of the skull through the vertex without intravenous contrast. COMPARISON:  CT 09/07/2016, MR 09/08/2016 FINDINGS: Brain: Stable region of encephalomalacia involving the left thalamus, basal ganglia and external capsule with chronic features compatible with wallerian degeneration along the white matter tracts extending towards the brainstem. Associated asymmetric ex vacuo dilatation of the body of the left lateral ventricle, stable from prior. No evidence of acute infarction, hemorrhage, hydrocephalus, extra-axial collection, visible mass lesion or mass effect. Symmetric prominence of the ventricles, cisterns and sulci compatible with parenchymal volume loss. Patchy areas of white matter hypoattenuation are most compatible with chronic microvascular angiopathy. Vascular: Atherosclerotic calcification of the carotid siphons and intradural vertebral arteries. No hyperdense vessel. Skull: Some mild right frontal scalp thickening, possible contusive change, correlate for point tenderness. No calvarial fracture or acute osseous abnormalities. Benign hyperostosis frontalis interna. Sinuses/Orbits: Chronic nodular mural thickening throughout the paranasal sinuses minimally within the nasal passages as well as septations seen in the frontal sinuses bilaterally. Some pneumatized layering fluid present in the bilateral mastoids as  well. No  visible temporal bone fracture within the limitations of this thick slice imaging. Middle ear cavities are clear. Debris in the bilateral external auditory canals. Included orbital structures are unremarkable. Other: None IMPRESSION: 1. Some mild right frontal scalp thickening, possible contusive change, correlate for point tenderness. No calvarial fracture. 2. No acute intracranial abnormality. Specifically no intracranial hemorrhage or extra-axial collection. 3. Stable region of encephalomalacia involving the left thalamus, basal ganglia and external capsule with chronic features compatible with wallerian degeneration hypoattenuation extending towards the brainstem. 4. Chronic microvascular angiopathy and parenchymal volume loss. 5. Chronic nodular mural thickening throughout the paranasal sinuses minimally within the nasal passages as well as septations seen in the frontal sinuses bilaterally. Correlate for features of acute on chronic sinusitis versus sinonasal polyposis. 6. Chronic mastoid effusions. 7. Debris in the bilateral external auditory canals, correlate for cerumen impaction. Electronically Signed   By: Lovena Le M.D.   On: 07/13/2020 23:25   CT Hip Right Wo Contrast  Result Date: 07/14/2020 CLINICAL DATA:  Fall, hip fracture suspected EXAM: CT OF THE RIGHT HIP WITHOUT CONTRAST TECHNIQUE: Multidetector CT imaging of the right hip was performed according to the standard protocol. Multiplanar CT image reconstructions were also generated. COMPARISON:  Radiograph 07/13/2020, CT abdomen pelvis 11/15/2013 FINDINGS: Bones/Joint/Cartilage The osseous structures appear diffusely demineralized which may limit detection of small or nondisplaced fractures. The questionable band of sclerosis through the femoral neck appears to correspond to projection of normal preserved trabeculation in the femoral neck rather than acute fracture. Moderate joint space narrowing of the right acetabulum with  periacetabular spurring. 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 Some minimal in these a pathic changes are noted on the greater and lesser trochanter. Trace hip effusion. Included bones of the pelvis are intact and congruent. Partially included bones of the right hand are unremarkable aside from some mild interphalangeal arthrosis. Ligaments Suboptimally assessed by CT. Muscles and Tendons Some mild likely disuse atrophy of the musculature. No acute intramuscular hemorrhage or collection. No evidence of retracted torn tendons rather acute intramuscular abnormality is seen. 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 Soft tissues Mild lateral right hip subcutaneous stranding, possibly some contusive change without hematoma. No soft tissue gas or foreign body. Included portions of the pelvis demonstrate a small fat containing right inguinal hernia. Benign prostate calcifications. IMPRESSION: 1. No convincing evidence of acute fracture or traumatic malalignment. 2. The questionable band of sclerosis through the femoral neck appears to correspond to projection of normal preserved trabeculation in the femoral neck rather than acute fracture. 3. 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 Background of right hip osteoarthrosis. 4. Trace hip effusion. 5. Mild lateral right hip subcutaneous stranding, possibly some contusive change without hematoma. 6. 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 Electronically Signed   By: Lovena Le M.D.   On:  07/14/2020 02:08   DG Knee Complete 4 Views Right  Result Date: 07/13/2020 CLINICAL DATA:  Right knee injury status post fall EXAM: RIGHT KNEE - COMPLETE 4+ VIEW COMPARISON:  None. FINDINGS: No evidence of fracture or dislocation. Small effusion. No evidence of severe  arthropathy. No aggressive appearing focal bone abnormality. Soft tissues are unremarkable. IMPRESSION: No acute displaced fracture or dislocation. Electronically Signed   By: Iven Finn M.D.   On: 07/13/2020 17:38   DG Hip Unilat W or Wo Pelvis 2-3 Views Right  Result Date: 07/13/2020 CLINICAL DATA:  Fall, lateral and posterior hip pain EXAM: DG HIP (WITH OR WITHOUT PELVIS) 2-3V RIGHT COMPARISON:  Radiograph 10/01/2018, CT 11/15/2013 FINDINGS: The osseous structures appear diffusely demineralized which may limit detection of small or nondisplaced fractures. There is a thin band of questionable sclerosis extending through the femoral neck. However there is no discernible cortical step-off or distracted fracture fragments. The femoral head remains normally located within the acetabulum. Degenerative changes present in the spine, hips and pelvis including extensive spurring present at the hips bilaterally with degenerative os acetabuli as present on comparisons. Remaining bones of the pelvis appear intact and congruent. Arcuate lines are contiguous. Some mild lateral right hip swelling. IMPRESSION: 1. Thin band of questionable sclerosis extending through the right femoral neck. Could reflect a nondisplaced fracture although there is no discernible cortical step-off or distracted fracture fragments. Could consider further evaluation with cross-sectional imaging. 2. Degenerative changes in the spine, hips and pelvis. Electronically Signed   By: Lovena Le M.D.   On: 07/13/2020 23:20    EKG: Personally reviewed.  Rhythm is sinus bradycardia with heart rate of 54 bpm.  No dynamic ST segment changes  appreciated.  Assessment/Plan Principal Problem:   Right hip pain   Patient complaining of severe right hip pain radiating down the right leg.  On my examination when I isolate the right hip joint there is no reproducible pain.  CT hip reveals no fracture.  That being said, the patient is unable to bear weight on his leg for now due to musculoskeletal injury.  PT evaluation ordered for the morning  As needed opiate-based analgesics for substantial pain which can quickly be deescalated  Active Problems:   Fall at home, initial encounter   Seemingly mechanical fall while patient was walking to his bedroom with his walker.    Essential hypertension   Continue home regimen of antihypertensive therapy    Spastic hemiparesis of right dominant side ([place])   Patient exhibits baseline neurologic function  Patient suffers from chronic sequela of right-sided hemiparesis including dependent edema of the right upper and lower extremity as well as contractures and evidence of candidal infection of the right axilla.  Candidal intertrigo   Nystatin powder twice daily    Type 2 diabetes mellitus with stage 3a  chronic kidney disease, with long-term current use of insulin (HCC)   Continue home regimen of Lantus  Accu-Cheks before every meal and nightly with sliding scale insulin  Hemoglobin A1c pending    Chronic kidney disease, stage 3a (HCC)   Strict input and output monitoring  Monitoring renal function electrolytes with serial chemistries  Minimizing nephrotoxic agents    Mixed diabetic hyperlipidemia associated with type 2 diabetes mellitus ([place])   Continue home regimen of statin therapy    Code Status:  Full code Family Communication: deferred   Status is: Observation  The patient remains OBS appropriate and will d/c before 2 midnights.  Dispo: The patient is from: Home              Anticipated d/c is to: Home              Anticipated d/c date is: 2  days  Patient currently is not medically stable to d/c.        Vernelle Emerald MD [facility] Pager [phone]  If 7PM-7AM, please contact night-coverage [email or web address] Use universal [place] password for that web site. If you do not have the password, please call the hospital operator.  07/14/2020, 9:01 AM

## 2020-07-15 ENCOUNTER — Ambulatory Visit: Payer: Medicare Other | Admitting: Physical Therapy

## 2020-07-15 LAB — GLUCOSE, CAPILLARY
Glucose-Capillary: 196 mg/dL — ABNORMAL HIGH (ref 70–99)
Glucose-Capillary: 278 mg/dL — ABNORMAL HIGH (ref 70–99)
Glucose-Capillary: 128 mg/dL — ABNORMAL HIGH (ref 70–99)
Glucose-Capillary: 176 mg/dL — ABNORMAL HIGH (ref 70–99)

## 2020-07-15 LAB — BASIC METABOLIC PANEL
Anion gap: 12 (ref 5–15)
BUN: 20 mg/dL (ref 8–23)
CO2: 27 mmol/L (ref 22–32)
Calcium: 8.5 mg/dL — ABNORMAL LOW (ref 8.9–10.3)
Chloride: 102 mmol/L (ref 98–111)
Creatinine, Ser: 1.53 mg/dL — ABNORMAL HIGH (ref 0.61–1.24)
GFR, Estimated: 51 mL/min — ABNORMAL LOW (ref >60)
Glucose, Bld: 202 mg/dL — ABNORMAL HIGH (ref 70–99)
Potassium: 3 mmol/L — ABNORMAL LOW (ref 3.5–5.1)
Sodium: 141 mmol/L (ref 135–145)

## 2020-07-15 LAB — MAGNESIUM: Magnesium: 1.8 mg/dL (ref 1.7–2.4)

## 2020-07-15 MED ORDER — POTASSIUM CHLORIDE CRYS ER 20 MEQ PO TBCR
40.0000 meq | EXTENDED_RELEASE_TABLET | ORAL | Status: AC
  Administered 2020-07-15 (×2): 40 meq via ORAL
  Filled 2020-07-15 (×2): qty 2

## 2020-07-15 NOTE — Evaluation (Signed)
Occupational Therapy Evaluation Patient Details Name: Eduardo Young MRN: [id number] DOB: Mar 31, 1956 Today's Date: 07/15/2020    History of Present Illness 64 year old male with past medical history of hemorrhagic stroke with right-sided weakness, insulin-dependent diabetes mellitus type 2, hyperlipidemia, hypertension, obstructive sleep apnea (on cpap), gout and chronic kidney disease stage IIIa who presents to [facility] emergency department with complaints of right lower extremity pain status post fall. xray questionable for hip fx, CT negative, xray R knee = small effusion   Clinical Impression   [name] is a 64 year old man with above medical history who presents today with right sided hemiplegia, pain in right knee, decreased activity tolerance and impaired balance resulting in a decline in patient being able to perform baseline transfers, ambulation and ADLs. Patient typically mod I for ADLs in supine and toileting (transfer to [facility] vs ambulating into bathroom). Patient min assist for supine to sit and pivot to recliner. Patient unable to take one step with hemiwalker today. Patient near baseline in regards to bathing and dressing while in supine but needs mod assist for UB dressing and LB dressing sitting at side of bed. Patient needing min assist to pivot for transfer to [facility] and max assist for toileting. Patient will benefit from skilled OT services while in hospital to improve deficits and learn compensatory strategies as needed in order to return to PLOF.      Follow Up Recommendations  SNF    Equipment Recommendations  None recommended by OT    Recommendations for Other Services       Precautions / Restrictions Precautions Precautions: Fall Precaution Comments: R sided hemiplegia, clonus of RUE with movement Restrictions Weight Bearing Restrictions: No      Mobility Bed Mobility Overal bed mobility: Needs Assistance Bed Mobility: Supine to Sit      Supine to sit: Min assist     General bed mobility comments: Use of bed rail for supine to sit. Attempted x 1 and patient unable to fully get trunk upright and wen back down onto his side. ON second attempt therapist provided min assist to left knee to assist with counter weight - patient then able to grasp foot board to pull himself all the way up and to he edge o the bed.    Transfers Overall transfer level: Needs assistance Equipment used: Hemi-walker Transfers: Sit to/from [facility] Sit to Stand: Min assist;From elevated surface Stand pivot transfers: Min assist       General transfer comment: Unable to take a step with hemiwalker exhibiting difficulty weight bearing through RLE and fear. ABle to perform pivot to chair placed on his right.    Balance Overall balance assessment: Needs assistance Sitting-balance support: No upper extremity supported;Feet supported Sitting balance-Leahy Scale: Good Sitting balance - Comments: able to lean over and fix shoe   Standing balance support: Single extremity supported Standing balance-Leahy Scale: Poor                             ADL either performed or assessed with clinical judgement   ADL Overall ADL's : Needs assistance/impaired Eating/Feeding: Modified independent   Grooming: Set up   Upper Body Bathing: Minimal assistance;Sitting Upper Body Bathing Details (indicate cue type and reason): washed UB sitting in chair Lower Body Bathing: Set up;Bed level Lower Body Bathing Details (indicate cue type and reason): reports able to perform sponge baths in supine. Upper Body Dressing :  Moderate assistance;Sitting   Lower Body Dressing: Set up;Bed level;Moderate assistance Lower Body Dressing Details (indicate cue type and reason): able to don and dof socks and don pants in supine position - bringing right foot over left knee. MIn assist to pull pants up fully over buttocks in standing. Assistance to don  shoes in seated position. Toilet Transfer: Minimal Production assistant, radio Details (indicate cue type and reason): Demonstrates ability to pivot to right Toileting- Clothing Manipulation and Hygiene: Maximal assistance;Sit to/from stand Toileting - Clothing Manipulation Details (indicate cue type and reason): Needing assistance for clothing management             Vision Patient Visual Report: No change from baseline       Perception     Praxis      Pertinent Vitals/Pain Pain Assessment: Faces Faces Pain Scale: Hurts a little bit Pain Location: right knee with WBing Pain Descriptors / Indicators: Grimacing;Guarding Pain Intervention(s): Limited activity within patient's tolerance     Hand Dominance Left   Extremity/Trunk Assessment Upper Extremity Assessment Upper Extremity Assessment: RUE deficits/detail;LUE deficits/detail RUE Deficits / Details: No active movement of RUE, extensor synergy pattern, hypotonicity with clonus, fingers curled at PIPs LUE Deficits / Details: WFL ROM and strength   Lower Extremity Assessment Lower Extremity Assessment: Defer to PT evaluation   Cervical / Trunk Assessment Cervical / Trunk Assessment: Normal   Communication Communication Communication: Expressive difficulties   Cognition Arousal/Alertness: Awake/alert Behavior During Therapy: WFL for tasks assessed/performed Overall Cognitive Status: Within Functional Limits for tasks assessed                                     General Comments       Exercises     Shoulder Instructions      Home Living Family/patient expects to be discharged to:: Private residence Living Arrangements: Other relatives (sister) Available Help at Discharge: Family;Available PRN/intermittently Type of Home: House Home Access: Level entry (threshold)     Home Layout: One level     Bathroom Shower/Tub: Teacher, early years/pre: Standard     Home  Equipment: Wheelchair - manual;Cane - quad;Grab bars - toilet;Tub bench   Additional Comments: Reports not being able to get into the bathroom now.      Prior Functioning/Environment Level of Independence: Needs assistance  Gait / Transfers Assistance Needed: pt and sister report he uses LBQC to amb ~ 10' to get into bedroom and bathroom. (previous notes state pt used hemiwalker, will clarify) ADL's / Homemaking Assistance Needed: Patient set up for dressing - he can perform supine in bed. Set up for sponge bathes and washes hair at sink with assistance. Toileting he is mod I - potentially pivots to [facility] instead of going into batroom. Communication / Swallowing Assistance Needed: Mild-moderate expressive aphasia          OT Problem List: Decreased strength;Decreased range of motion;Decreased activity tolerance;Impaired balance (sitting and/or standing);Decreased coordination;Decreased knowledge of use of DME or AE;Impaired UE functional use;Pain;Obesity;Impaired tone      OT Treatment/Interventions: Self-care/ADL training;Therapeutic exercise;DME and/or AE instruction;Therapeutic activities;Patient/family education;Balance training    OT Goals(Current goals can be found in the care plan section) Acute Rehab OT Goals Patient Stated Goal: to return to being able to walk short distances in order to go home OT Goal Formulation: With patient Time For Goal Achievement: 07/29/20 Potential to Achieve Goals: [place]  OT  Frequency: Min 2X/week   Barriers to D/C:            Co-evaluation              AM-PAC OT "6 Clicks" Daily Activity     Outcome Measure Help from another person eating meals?: None Help from another person taking care of personal grooming?: A Little Help from another person toileting, which includes using toliet, bedpan, or urinal?: A Lot Help from another person bathing (including washing, rinsing, drying)?: A Little Help from another person to put on and taking off  regular upper body clothing?: A Lot Help from another person to put on and taking off regular lower body clothing?: A Lot 6 Click Score: 16   End of Session Equipment Utilized During Treatment: Gait belt Education officer, community) Nurse Communication:  (okay to see per RN)  Activity Tolerance: Patient tolerated treatment well Patient left: in chair;with call bell/phone within reach;with chair alarm set  OT Visit Diagnosis: Other abnormalities of gait and mobility (R26.89);History of falling (Z91.81);Muscle weakness (generalized) (M62.81);Hemiplegia and hemiparesis Hemiplegia - Right/Left: Right Hemiplegia - dominant/non-dominant: Dominant Hemiplegia - caused by: Cerebral infarction                Time: 5638-9373 OT Time Calculation (min): 34 min Charges:  OT General Charges $OT Visit: 1 Visit OT Evaluation $OT Eval Moderate Complexity: 1 Mod OT Treatments $Self Care/Home Management : 8-22 mins  Shyane Fossum, OTR/L Acute Care Rehab Services  Office [phone] Pager: [place] 07/15/2020, 9:57 AM

## 2020-07-15 NOTE — Progress Notes (Signed)
PROGRESS NOTE    [name]  IZT:[id number] DOB: 02/15/1956 DOA: 07/13/2020 PCP: Susy Frizzle, MD     Brief Narrative:  Eduardo Young is a 64 year old male with past medical history of hemorrhagic stroke with right-sided weakness, insulin-dependent diabetes mellitus type 2, hyperlipidemia, hypertension, obstructive sleep apnea (on cpap), gout and chronic kidney disease stage IIIa who presents to [facility] emergency department with complaints of right lower extremity pain status post fall.  Patient explains that at approximately 3 AM this morning he was walking in his bedroom when he was attempting to pivot into bed.  He accidentally fell, striking his head on the dresser and landing on his right hip.  He suddenly began to experience severe right hip and right lower extremity pain.  Patient describes the pain as 10 out of 10 in intensity, throbbing in quality, radiating from the right hip down to the right foot.  Patient states pain is worse with movement of the right lower extremity and improved with rest.  Initial x-ray of the right hip revealed a questionable band of sclerosis around the femoral neck that the radiologist thought could possibly be evidence of an underlying fracture.  Patient was then sent for CT imaging of the right hip that ended up being negative for fracture.  Due to patient's ongoing hip pain and inability to ambulate the hospitalist group was called to assess the patient for admission the hospital.   New events last 24 hours / Subjective: Patient worked with OT this morning, was able to get up out of bed into a chair.  Feeling well overall.  Assessment & Plan:   Principal Problem:   Right hip pain Active Problems:   Essential hypertension   OSA (obstructive sleep apnea)   Spastic hemiparesis of right dominant side (HCC)   Type 2 diabetes mellitus with stage 3a chronic kidney disease, with long-term current use of insulin (HCC)   Chronic kidney  disease, stage 3a (HCC)   Mixed diabetic hyperlipidemia associated with type 2 diabetes mellitus ([place])   Fall at home, initial encounter   Candidal intertrigo   Right hip pain status post mechanical fall at home -Imaging without acute abnormality -PT recommending SNF placement  Essential hypertension -Continue Norvasc, Catapres, hydralazine, labetalol, lisinopril  Spastic hemiparesis of the right -Chronic -Continue aspirin, Lipitor, baclofen  Diabetes mellitus type 2 uncontrolled with hyperglycemia -Hemoglobin A1c > 14 -Continue Lantus, NovoLog, sliding scale insulin  CKD stage IIIa -Baseline creatinine 1.6 -Stable   Hypokalemia -Replace, trend  Gout -Continue allopurinol     DVT prophylaxis: Lovenox   Code Status: Full code Family Communication: No family at bedside Disposition Plan:  Status is: Inpatient  Remains inpatient appropriate because:Unsafe d/c plan   Dispo: The patient is from: Home              Anticipated d/c is to: SNF              Anticipated d/c date is: 1 day              Patient currently is medically stable to d/c. Awaiting SNF placement.    Consultants:   None  Procedures:   None   Antimicrobials:  Anti-infectives (From admission, onward)   None        Objective: Vitals:   07/14/20 1808 07/14/20 2145 07/14/20 2148 07/15/20 0548  BP: (!) 170/89 (!) 142/82  (!) 153/66  Pulse: 64 68  (!) 57  Resp: 18 16  17  Temp: 97.8 F (36.6 C) 98.9 F (37.2 C)    TempSrc: Oral Oral    SpO2: 95% (!) 88% 92% 95%  Weight:      Height:        Intake/Output Summary (Last 24 hours) at 07/15/2020 1052 Last data filed at 07/15/2020 0931 Gross per 24 hour  Intake 250 ml  Output 1550 ml  Net -1300 ml   Filed Weights   07/13/20 1622 07/14/20 0549  Weight: (!) 140.6 kg 130.9 kg    Examination:  General exam: Appears calm and comfortable  Respiratory system: Clear to auscultation. Respiratory effort normal. No respiratory  distress. No conversational dyspnea.  Cardiovascular system: S1 & S2 heard, RRR. No murmurs. No pedal edema. Gastrointestinal system: Abdomen is nondistended, soft and nontender. Normal bowel sounds heard. Central nervous system: Alert and oriented.  Extremities: Symmetric Skin: No rashes, lesions or ulcers on exposed skin  Psychiatry: Judgement and insight appear normal. Mood & affect appropriate.   Data Reviewed: I have personally reviewed following labs and imaging studies  CBC: Recent Labs  Lab 07/13/20 1429 07/13/20 1657 07/14/20 0803  WBC 10.2 10.6* 8.0  NEUTROABS 7,630  --  5.0  HGB 13.3 13.3 12.2*  HCT 38.7 40.0 37.3*  MCV 86.6 87.7 89.4  PLT 227 228 007   Basic Metabolic Panel: Recent Labs  Lab 07/13/20 1429 07/13/20 1657 07/14/20 0803 07/15/20 0331  NA 140 135 137 141  K 3.4* 3.2* 2.7* 3.0*  CL 97* 97* 98 102  CO2 30 26 26 27   GLUCOSE 316* 387* 270* 202*  BUN 18 20 21 20   CREATININE 1.65* 1.77* 1.67* 1.53*  CALCIUM 8.9 8.7* 8.1* 8.5*  MG  --   --  1.8 1.8   GFR: Estimated Creatinine Clearance: 73.9 mL/min (A) (by C-G formula based on SCr of 1.53 mg/dL (H)). Liver Function Tests: Recent Labs  Lab 07/13/20 1429 07/14/20 0803  AST 15 15  ALT 19 22  ALKPHOS  --  100  BILITOT 0.6 0.8  PROT 6.0* 6.1*  ALBUMIN  --  3.3*   No results for input(s): LIPASE, AMYLASE in the last 168 hours. No results for input(s): AMMONIA in the last 168 hours. Coagulation Profile: No results for input(s): INR, PROTIME in the last 168 hours. Cardiac Enzymes: No results for input(s): CKTOTAL, CKMB, CKMBINDEX, TROPONINI in the last 168 hours. BNP (last 3 results) No results for input(s): PROBNP in the last 8760 hours. HbA1C: Recent Labs    07/13/20 1429  HGBA1C >14.0*   CBG: Recent Labs  Lab 07/14/20 0734 07/14/20 1156 07/14/20 1720 07/14/20 2149 07/15/20 0743  GLUCAP 238* 289* 234* 261* 196*   Lipid Profile: No results for input(s): CHOL, HDL, LDLCALC, TRIG,  CHOLHDL, LDLDIRECT in the last 72 hours. Thyroid Function Tests: No results for input(s): TSH, T4TOTAL, FREET4, T3FREE, THYROIDAB in the last 72 hours. Anemia Panel: No results for input(s): VITAMINB12, FOLATE, FERRITIN, TIBC, IRON, RETICCTPCT in the last 72 hours. Sepsis Labs: No results for input(s): PROCALCITON, LATICACIDVEN in the last 168 hours.  Recent Results (from the past 240 hour(s))  Resp Panel by RT-PCR (Flu A&B, Covid) Nasopharyngeal Swab     Status: None   Collection Time: 07/14/20  3:45 AM   Specimen: Nasopharyngeal Swab; Nasopharyngeal(NP) swabs in vial transport medium  Result Value Ref Range Status   SARS Coronavirus 2 by RT PCR NEGATIVE NEGATIVE Final    Comment: (NOTE) SARS-CoV-2 target nucleic acids are NOT DETECTED.  The  SARS-CoV-2 RNA is generally detectable in upper respiratory specimens during the acute phase of infection. The lowest concentration of SARS-CoV-2 viral copies this assay can detect is 138 copies/mL. A negative result does not preclude SARS-Cov-2 infection and should not be used as the sole basis for treatment or other patient management decisions. A negative result may occur with  improper specimen collection/handling, submission of specimen other than nasopharyngeal swab, presence of viral mutation(s) within the areas targeted by this assay, and inadequate number of viral copies(<138 copies/mL). A negative result must be combined with clinical observations, patient history, and epidemiological information. The expected result is Negative.  Fact Sheet for Patients:  EntrepreneurPulse.com.au  Fact Sheet for Healthcare Providers:  IncredibleEmployment.be  This test is no t yet approved or cleared by the [place] FDA and  has been authorized for detection and/or diagnosis of SARS-CoV-2 by FDA under an Emergency Use Authorization (EUA). This EUA will remain  in effect (meaning this test can be used) for  the duration of the COVID-19 declaration under Section 564(b)(1) of the Act, 21 U.S.C.section 360bbb-3(b)(1), unless the authorization is terminated  or revoked sooner.       Influenza A by PCR NEGATIVE NEGATIVE Final   Influenza B by PCR NEGATIVE NEGATIVE Final    Comment: (NOTE) The Xpert Xpress SARS-CoV-2/FLU/RSV plus assay is intended as an aid in the diagnosis of influenza from Nasopharyngeal swab specimens and should not be used as a sole basis for treatment. Nasal washings and aspirates are unacceptable for Xpert Xpress SARS-CoV-2/FLU/RSV testing.  Fact Sheet for Patients: EntrepreneurPulse.com.au  Fact Sheet for Healthcare Providers: IncredibleEmployment.be  This test is not yet approved or cleared by the [place] FDA and has been authorized for detection and/or diagnosis of SARS-CoV-2 by FDA under an Emergency Use Authorization (EUA). This EUA will remain in effect (meaning this test can be used) for the duration of the COVID-19 declaration under Section 564(b)(1) of the Act, 21 U.S.C. section 360bbb-3(b)(1), unless the authorization is terminated or revoked.  Performed at [facility], [place] [address], [place], [place] [address]       Radiology Studies: DG Chest 1 View  Result Date: 07/14/2020 CLINICAL DATA:  Pneumonia EXAM: CHEST  1 VIEW COMPARISON:  09/08/2016 FINDINGS: Low volume chest with asymmetric airspace opacity at the base and about the left hilum. Prominent heart size and vascular pedicle thickness, accentuated by rotation. No visible effusion or cavitation. IMPRESSION: Asymmetric opacity on the left correlating with history of pneumonia. Recommend follow-up to clearing. Electronically Signed   By: Monte Fantasia M.D.   On: 07/14/2020 05:35   CT Head Wo Contrast  Result Date: 07/13/2020 CLINICAL DATA:  Fall EXAM: CT HEAD WITHOUT CONTRAST TECHNIQUE: Contiguous axial images were obtained from  the base of the skull through the vertex without intravenous contrast. COMPARISON:  CT 09/07/2016, MR 09/08/2016 FINDINGS: Brain: Stable region of encephalomalacia involving the left thalamus, basal ganglia and external capsule with chronic features compatible with wallerian degeneration along the white matter tracts extending towards the brainstem. Associated asymmetric ex vacuo dilatation of the body of the left lateral ventricle, stable from prior. No evidence of acute infarction, hemorrhage, hydrocephalus, extra-axial collection, visible mass lesion or mass effect. Symmetric prominence of the ventricles, cisterns and sulci compatible with parenchymal volume loss. Patchy areas of white matter hypoattenuation are most compatible with chronic microvascular angiopathy. Vascular: Atherosclerotic calcification of the carotid siphons and intradural vertebral arteries. No hyperdense vessel. Skull: Some mild right frontal scalp thickening,  possible contusive change, correlate for point tenderness. No calvarial fracture or acute osseous abnormalities. Benign hyperostosis frontalis interna. Sinuses/Orbits: Chronic nodular mural thickening throughout the paranasal sinuses minimally within the nasal passages as well as septations seen in the frontal sinuses bilaterally. Some pneumatized layering fluid present in the bilateral mastoids as well. No visible temporal bone fracture within the limitations of this thick slice imaging. Middle ear cavities are clear. Debris in the bilateral external auditory canals. Included orbital structures are unremarkable. Other: None IMPRESSION: 1. Some mild right frontal scalp thickening, possible contusive change, correlate for point tenderness. No calvarial fracture. 2. No acute intracranial abnormality. Specifically no intracranial hemorrhage or extra-axial collection. 3. Stable region of encephalomalacia involving the left thalamus, basal ganglia and external capsule with chronic features  compatible with wallerian degeneration hypoattenuation extending towards the brainstem. 4. Chronic microvascular angiopathy and parenchymal volume loss. 5. Chronic nodular mural thickening throughout the paranasal sinuses minimally within the nasal passages as well as septations seen in the frontal sinuses bilaterally. Correlate for features of acute on chronic sinusitis versus sinonasal polyposis. 6. Chronic mastoid effusions. 7. Debris in the bilateral external auditory canals, correlate for cerumen impaction. Electronically Signed   By: Lovena Le M.D.   On: 07/13/2020 23:25   CT Hip Right Wo Contrast  Result Date: 07/14/2020 CLINICAL DATA:  Fall, hip fracture suspected EXAM: CT OF THE RIGHT HIP WITHOUT CONTRAST TECHNIQUE: Multidetector CT imaging of the right hip was performed according to the standard protocol. Multiplanar CT image reconstructions were also generated. COMPARISON:  Radiograph 07/13/2020, CT abdomen pelvis 11/15/2013 FINDINGS: Bones/Joint/Cartilage The osseous structures appear diffusely demineralized which may limit detection of small or nondisplaced fractures. The questionable band of sclerosis through the femoral neck appears to correspond to projection of normal preserved trabeculation in the femoral neck rather than acute fracture. Moderate joint space narrowing of the right acetabulum with periacetabular spurring. 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 Some minimal in these a pathic changes are noted on the greater and lesser trochanter. Trace hip effusion. Included bones of the pelvis are intact and congruent. Partially included bones of the right hand are unremarkable aside from some mild interphalangeal arthrosis. Ligaments Suboptimally assessed by CT. Muscles and Tendons Some mild likely disuse atrophy  of the musculature. No acute intramuscular hemorrhage or collection. No evidence of retracted torn tendons rather acute intramuscular abnormality is seen. 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 Soft tissues Mild lateral right hip subcutaneous stranding, possibly some contusive change without hematoma. No soft tissue gas or foreign body. Included portions of the pelvis demonstrate a small fat containing right inguinal hernia. Benign prostate calcifications. IMPRESSION: 1. No convincing evidence of acute fracture or traumatic malalignment. 2. The questionable band of sclerosis through the femoral neck appears to correspond to projection of normal preserved trabeculation in the femoral neck rather than acute fracture. 3. 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 Background of right hip osteoarthrosis. 4. Trace hip effusion. 5. Mild lateral right hip subcutaneous stranding, possibly some contusive change without hematoma. 6. 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 Electronically Signed  By: Lovena Le M.D.   On: 07/14/2020 02:08   DG Knee Complete 4 Views Right  Result Date: 07/13/2020 CLINICAL DATA:  Right knee injury status post fall EXAM: RIGHT KNEE - COMPLETE 4+ VIEW COMPARISON:  None. FINDINGS: No evidence of fracture or dislocation. Small effusion. No evidence of severe arthropathy. No aggressive appearing focal bone abnormality. Soft tissues are unremarkable. IMPRESSION: No acute displaced fracture or dislocation. Electronically Signed   By: Iven Finn M.D.   On: 07/13/2020 17:38   DG Hip Unilat W or Wo Pelvis 2-3 Views Right  Result Date: 07/13/2020 CLINICAL DATA:  Fall, lateral and  posterior hip pain EXAM: DG HIP (WITH OR WITHOUT PELVIS) 2-3V RIGHT COMPARISON:  Radiograph 10/01/2018, CT 11/15/2013 FINDINGS: The osseous structures appear diffusely demineralized which may limit detection of small or nondisplaced fractures. There is a thin band of questionable sclerosis extending through the femoral neck. However there is no discernible cortical step-off or distracted fracture fragments. The femoral head remains normally located within the acetabulum. Degenerative changes present in the spine, hips and pelvis including extensive spurring present at the hips bilaterally with degenerative os acetabuli as present on comparisons. Remaining bones of the pelvis appear intact and congruent. Arcuate lines are contiguous. Some mild lateral right hip swelling. IMPRESSION: 1. Thin band of questionable sclerosis extending through the right femoral neck. Could reflect a nondisplaced fracture although there is no discernible cortical step-off or distracted fracture fragments. Could consider further evaluation with cross-sectional imaging. 2. Degenerative changes in the spine, hips and pelvis. Electronically Signed   By: Lovena Le M.D.   On: 07/13/2020 23:20      Scheduled Meds:  allopurinol  100 mg Oral BID   amLODipine  10 mg Oral Daily   aspirin EC  81 mg Oral QPM   atorvastatin  40 mg Oral Daily   cloNIDine  0.2 mg Oral BID   enoxaparin (LOVENOX) injection  60 mg Subcutaneous Q24H   hydrALAZINE  50 mg Oral TID   insulin aspart  0-20 Units Subcutaneous TID AC & HS   insulin aspart  5 Units Subcutaneous BID WC   insulin aspart  7 Units Subcutaneous Q supper   insulin glargine  50 Units Subcutaneous Daily   labetalol  300 mg Oral BID   lisinopril  20 mg Oral BID   nystatin   Topical BID   potassium chloride  40 mEq Oral Q4H   Continuous Infusions:   LOS: 1 day      Time spent: 25 minutes   Dessa Phi, DO [facility] 07/15/2020, 10:52 AM   Available  via Epic secure chat 7am-7pm After these hours, please refer to coverage provider listed on [email or web address]

## 2020-07-15 NOTE — Progress Notes (Signed)
Physical Therapy Treatment Patient Details Name: Eduardo Young MRN: [id number] DOB: 1956/07/07 Today's Date: 07/15/2020    History of Present Illness 64 year old male with past medical history of hemorrhagic stroke with right-sided weakness, insulin-dependent diabetes mellitus type 2, hyperlipidemia, hypertension, obstructive sleep apnea (on cpap), gout and chronic kidney disease stage IIIa who presents to [facility] emergency department with complaints of right lower extremity pain status post fall. xray questionable for hip fx, CT negative, xray R knee = small effusion    PT Comments    Pt stated he was up in the recliner earlier today and is now too fatigued to get up again. He agreed to bed level exercises. Performed LE strengthening exercises with assistance. Pt puts forth good effort.   Follow Up Recommendations  SNF     Equipment Recommendations  None recommended by PT    Recommendations for Other Services       Precautions / Restrictions Precautions Precautions: Fall Precaution Comments: R sided hemiplegia, clonus of RUE with movement Restrictions Weight Bearing Restrictions: No Other Position/Activity Restrictions: WBAT    Mobility  Bed Mobility               General bed mobility comments: pt declined mobility, he stated he was up in the recliner for 1-2 hours ealier today and is tired now, he agreed to bed level exercises  Transfers                    Ambulation/Gait                 Stairs             Wheelchair Mobility    Modified Rankin (Stroke Patients Only)       Balance                                            Cognition Arousal/Alertness: Awake/alert Behavior During Therapy: WFL for tasks assessed/performed Overall Cognitive Status: Within Functional Limits for tasks assessed                                        Exercises Total Joint Exercises Towel Squeeze:  AROM;Both;10 reps;Supine General Exercises - Lower Extremity Quad Sets: AROM;Both;10 reps;Supine Short Arc Quad: AROM;Right;10 reps;Supine Heel Slides: AAROM;Right;10 reps;Supine Hip ABduction/ADduction: AAROM;Right;10 reps;Supine Straight Leg Raises: AAROM;Right;10 reps;Supine    General Comments        Pertinent Vitals/Pain Pain Assessment: No/denies pain    Home Living                      Prior Function            PT Goals (current goals can now be found in the care plan section) Acute Rehab PT Goals Patient Stated Goal: to return to being able to walk short distances PT Goal Formulation: With patient Time For Goal Achievement: 07/28/20 Potential to Achieve Goals: Good Progress towards PT goals: Progressing toward goals    Frequency    Min 2X/week      PT Plan Current plan remains appropriate    Co-evaluation              AM-PAC PT "6 Clicks" Mobility   Outcome Measure  Help needed turning from  your back to your side while in a flat bed without using bedrails?: A Little Help needed moving from lying on your back to sitting on the side of a flat bed without using bedrails?: A Little Help needed moving to and from a bed to a chair (including a wheelchair)?: A Little Help needed standing up from a chair using your arms (e.g., wheelchair or bedside chair)?: A Little Help needed to walk in hospital room?: Total Help needed climbing 3-5 steps with a railing? : Total 6 Click Score: 14    End of Session   Activity Tolerance: Patient tolerated treatment well Patient left: with call bell/phone within reach;in bed;with bed alarm set Nurse Communication: Mobility status PT Visit Diagnosis: Other abnormalities of gait and mobility (R26.89)     Time: 6160-7371 PT Time Calculation (min) (ACUTE ONLY): 17 min  Charges:  $Therapeutic Exercise: 8-22 mins                     Blondell Reveal Kistler PT 07/15/2020  Acute Rehabilitation  Services Pager [phone] Office [phone]

## 2020-07-15 NOTE — TOC Progression Note (Signed)
Transition of Care [facility]) - Progression Note    Patient Details  Name: Eduardo Young MRN: [id number] Date of Birth: 1956/03/12  [facility]) CM/SW Contact  Shade Flood, LCSW Phone Number: 07/15/2020, 11:19 AM  Clinical Narrative:     TOC following. Spoke with pt's sister, Manuela Schwartz, and with pt to review dc planning. Pt has selected [facility]. Per Rolla Plate at [facility], they can accept pt tomorrow. Updated MD who states pt will dc tomorrow. Pt's covid test results from yesterday are good for dc tomorrow. Pt's sister would like to transport. Covering TOC will confirm all dc plans with pt and his sister tomorrow.  Expected Discharge Plan: [place] Barriers to Discharge: Continued Medical Work up,Insurance Authorization  Expected Discharge Plan and Services Expected Discharge Plan: [place] In-house Referral: Clinical Social Work   Post Acute Care Choice: [place] Living arrangements for the past 2 months: [place]                                       Social Determinants of Health (SDOH) Interventions    Readmission Risk Interventions No flowsheet data found.

## 2020-07-16 DIAGNOSIS — I779 Disorder of arteries and arterioles, unspecified: Secondary | ICD-10-CM | POA: Diagnosis not present

## 2020-07-16 DIAGNOSIS — E118 Type 2 diabetes mellitus with unspecified complications: Secondary | ICD-10-CM | POA: Diagnosis not present

## 2020-07-16 DIAGNOSIS — N1831 Chronic kidney disease, stage 3a: Secondary | ICD-10-CM | POA: Diagnosis not present

## 2020-07-16 DIAGNOSIS — G4733 Obstructive sleep apnea (adult) (pediatric): Secondary | ICD-10-CM | POA: Diagnosis not present

## 2020-07-16 DIAGNOSIS — G8191 Hemiplegia, unspecified affecting right dominant side: Secondary | ICD-10-CM | POA: Diagnosis not present

## 2020-07-16 DIAGNOSIS — I10 Essential (primary) hypertension: Secondary | ICD-10-CM | POA: Diagnosis not present

## 2020-07-16 DIAGNOSIS — I69951 Hemiplegia and hemiparesis following unspecified cerebrovascular disease affecting right dominant side: Secondary | ICD-10-CM | POA: Diagnosis not present

## 2020-07-16 DIAGNOSIS — Z794 Long term (current) use of insulin: Secondary | ICD-10-CM | POA: Diagnosis not present

## 2020-07-16 DIAGNOSIS — R5381 Other malaise: Secondary | ICD-10-CM | POA: Diagnosis not present

## 2020-07-16 DIAGNOSIS — E782 Mixed hyperlipidemia: Secondary | ICD-10-CM | POA: Diagnosis not present

## 2020-07-16 DIAGNOSIS — G8111 Spastic hemiplegia affecting right dominant side: Secondary | ICD-10-CM | POA: Diagnosis not present

## 2020-07-16 DIAGNOSIS — I6930 Unspecified sequelae of cerebral infarction: Secondary | ICD-10-CM | POA: Diagnosis not present

## 2020-07-16 DIAGNOSIS — M25551 Pain in right hip: Secondary | ICD-10-CM | POA: Diagnosis not present

## 2020-07-16 DIAGNOSIS — I69322 Dysarthria following cerebral infarction: Secondary | ICD-10-CM | POA: Diagnosis not present

## 2020-07-16 DIAGNOSIS — W19XXXD Unspecified fall, subsequent encounter: Secondary | ICD-10-CM | POA: Diagnosis not present

## 2020-07-16 DIAGNOSIS — K5901 Slow transit constipation: Secondary | ICD-10-CM | POA: Diagnosis not present

## 2020-07-16 DIAGNOSIS — R2689 Other abnormalities of gait and mobility: Secondary | ICD-10-CM | POA: Diagnosis not present

## 2020-07-16 DIAGNOSIS — I872 Venous insufficiency (chronic) (peripheral): Secondary | ICD-10-CM | POA: Diagnosis not present

## 2020-07-16 DIAGNOSIS — E1122 Type 2 diabetes mellitus with diabetic chronic kidney disease: Secondary | ICD-10-CM | POA: Diagnosis not present

## 2020-07-16 LAB — BASIC METABOLIC PANEL
Anion gap: 11 (ref 5–15)
BUN: 14 mg/dL (ref 8–23)
CO2: 26 mmol/L (ref 22–32)
Calcium: 8.4 mg/dL — ABNORMAL LOW (ref 8.9–10.3)
Chloride: 101 mmol/L (ref 98–111)
Creatinine, Ser: 1.57 mg/dL — ABNORMAL HIGH (ref 0.61–1.24)
GFR, Estimated: 50 mL/min — ABNORMAL LOW (ref >60)
Glucose, Bld: 199 mg/dL — ABNORMAL HIGH (ref 70–99)
Potassium: 3.3 mmol/L — ABNORMAL LOW (ref 3.5–5.1)
Sodium: 138 mmol/L (ref 135–145)

## 2020-07-16 LAB — GLUCOSE, CAPILLARY: Glucose-Capillary: 185 mg/dL — ABNORMAL HIGH (ref 70–99)

## 2020-07-16 MED ORDER — LANTUS SOLOSTAR 100 UNIT/ML ~~LOC~~ SOPN: 50.0000 [IU] | PEN_INJECTOR | Freq: Every day | SUBCUTANEOUS | 0 refills | Status: DC

## 2020-07-16 MED ORDER — POTASSIUM CHLORIDE CRYS ER 20 MEQ PO TBCR
40.0000 meq | EXTENDED_RELEASE_TABLET | ORAL | Status: DC
  Administered 2020-07-16: 40 meq via ORAL
  Filled 2020-07-16: qty 2

## 2020-07-16 NOTE — Discharge Summary (Signed)
Physician Discharge Summary  Eduardo Young GGY:[id number] DOB: Dec 16, 1955 DOA: 07/13/2020  PCP: Susy Frizzle, MD  Admit date: 07/13/2020 Discharge date: 07/16/2020  Admitted From: Home Disposition:  SNF   Recommendations for Outpatient Follow-up:  1. Follow up with PCP in 1 week  Discharge Condition: Stable CODE STATUS: Full  Diet recommendation:  Diet Orders (From admission, onward)    Start     Ordered   07/16/20 0000  Diet - low sodium heart healthy        07/16/20 0909   07/16/20 0000  Diet Carb Modified        07/16/20 0909   07/14/20 0459  Diet heart healthy/carb modified Room service appropriate? Yes; Fluid consistency: Thin  Diet effective now       Question Answer Comment  Diet-HS Snack? Nothing   Room service appropriate? Yes   Fluid consistency: Thin      07/14/20 0458         Brief/Interim Summary: Eduardo Young is a 64 year old male with past medical history of hemorrhagic stroke with right-sided weakness, insulin-dependent diabetes mellitus type 2, hyperlipidemia, hypertension, obstructive sleep apnea(on cpap),gout and chronic kidney disease stage IIIa who presents to [facility] emergency department with complaints of right lower extremity pain status post fall.  Patient explains that at approximately 3 AM this morning he was walking in his bedroom when he was attempting to pivot into bed. He accidentally fell, striking his head on the dresser and landing on his right hip. He suddenly began to experience severe right hip and right lower extremity pain. Patient describes the pain as 10 out of 10 in intensity, throbbing in quality, radiating from the right hip down to the right foot. Patient states pain is worse with movement of the right lower extremity and improved with rest.  Initial x-ray of the right hip revealed a questionable band of sclerosis around the femoral neck that the radiologist thought could possibly be evidence of an underlying  fracture. Patient was then sent for CT imaging of the right hip that ended up being negative for fracture. Due to patient's ongoing hip pain and inability to ambulate the hospitalist group was called to assess the patient for admission the hospital.   He worked with PT OT who recommended SNF placement for therapy. He remained medically stable and improved condition.   Discharge Diagnoses:  Principal Problem:   Right hip pain Active Problems:   Essential hypertension   OSA (obstructive sleep apnea)   Spastic hemiparesis of right dominant side (HCC)   Type 2 diabetes mellitus with stage 3a chronic kidney disease, with long-term current use of insulin (HCC)   Chronic kidney disease, stage 3a (HCC)   Mixed diabetic hyperlipidemia associated with type 2 diabetes mellitus ([place])   Fall at home, initial encounter   Candidal intertrigo   Right hip pain status post mechanical fall at home -Imaging without acute abnormality -PT recommending SNF placement  Essential hypertension -Continue Norvasc, Catapres, hydralazine, labetalol, lisinopril, HCTZ   Spastic hemiparesis of the right -Chronic -Continue aspirin, Lipitor, baclofen  Diabetes mellitus type 2 uncontrolled with hyperglycemia -Hemoglobin A1c > 14 -Continue Lantus, NovoLog, sliding scale insulin  CKD stage IIIa -Baseline creatinine 1.6 -Stable   Hypokalemia -Replace  Gout -Continue allopurinol    Discharge Instructions  Discharge Instructions    Diet - low sodium heart healthy   Complete by: As directed    Diet Carb Modified   Complete by: As directed  Increase activity slowly   Complete by: As directed      Allergies as of 07/16/2020   No Known Allergies     Medication List    STOP taking these medications   HYDROcodone-acetaminophen 5-325 MG tablet Commonly known as: Norco   linaclotide 145 MCG Caps capsule Commonly known as: Linzess   meloxicam 7.5 MG tablet Commonly known as: Mobic    traMADol 50 MG tablet Commonly known as: ULTRAM     TAKE these medications   Accu-Chek Aviva Plus w/Device Kit Check BS BID DX - E11.9   Accu-Chek FastClix Lancet Kit Check BS BID DX - E11.9   Accu-Chek FastClix Lancets Misc Check BS BID DX - E11.9   allopurinol 100 MG tablet Commonly known as: ZYLOPRIM Take 1 tablet by mouth twice daily   amLODipine 10 MG tablet Commonly known as: NORVASC Take 1 tablet by mouth once daily   aspirin 81 MG tablet Take 81 mg by mouth every evening.   atorvastatin 40 MG tablet Commonly known as: LIPITOR Take 1 tablet (40 mg total) by mouth daily.   baclofen 10 MG tablet Commonly known as: LIORESAL Take 0.5-1 tablets (5-10 mg total) by mouth 3 (three) times daily as needed for muscle spasms.   bisacodyl 5 MG EC tablet Commonly known as: bisacodyl Take 1 tablet (5 mg total) by mouth daily as needed for moderate constipation.   cloNIDine 0.2 MG tablet Commonly known as: CATAPRES Take 1 tablet by mouth twice daily   doxazosin 4 MG tablet Commonly known as: CARDURA Take 1 tablet by mouth once daily   glucose blood test strip Commonly known as: Accu-Chek Aviva Check BS BID DX - E11.9   hydrALAZINE 25 MG tablet Commonly known as: APRESOLINE Take 2 tablets (50 mg total) by mouth 3 (three) times daily.   hydrochlorothiazide 25 MG tablet Commonly known as: HYDRODIURIL Take 1 tablet (25 mg total) by mouth daily.   insulin lispro 100 UNIT/ML KwikPen Commonly known as: HUMALOG INJECT 7 UNITS SUBCUTANEOUSLY ONCE DAILY What changed: See the new instructions.   labetalol 300 MG tablet Commonly known as: NORMODYNE Take 1 tablet by mouth twice daily   Lantus SoloStar 100 UNIT/ML Solostar Pen Generic drug: insulin glargine Inject 50 Units into the skin at bedtime. What changed:   See the new instructions.  Another medication with the same name was removed. Continue taking this medication, and follow the directions you see here.    lisinopril 20 MG tablet Commonly known as: ZESTRIL Take 1 tablet by mouth twice daily What changed: when to take this   multivitamin with minerals Tabs tablet Take 1 tablet by mouth daily.   polyethylene glycol powder 17 GM/SCOOP powder Commonly known as: GLYCOLAX/MIRALAX Take 17 g by mouth daily.   potassium chloride SA 20 MEQ tablet Commonly known as: KLOR-CON Take 1 tablet (20 mEq total) by mouth daily.   Tradjenta 5 MG Tabs tablet Generic drug: linagliptin Take 1 tablet by mouth once daily       Contact information for after-discharge care    Destination    [facility] .   Service: Skilled Nursing Contact information: [address] [place] [phone]                 No Known Allergies  Consultations:  None    Procedures/Studies: DG Chest 1 View  Result Date: 07/14/2020 CLINICAL DATA:  Pneumonia EXAM: CHEST  1 VIEW COMPARISON:  09/08/2016 FINDINGS: Low volume chest  with asymmetric airspace opacity at the base and about the left hilum. Prominent heart size and vascular pedicle thickness, accentuated by rotation. No visible effusion or cavitation. IMPRESSION: Asymmetric opacity on the left correlating with history of pneumonia. Recommend follow-up to clearing. Electronically Signed   By: Monte Fantasia M.D.   On: 07/14/2020 05:35   CT Head Wo Contrast  Result Date: 07/13/2020 CLINICAL DATA:  Fall EXAM: CT HEAD WITHOUT CONTRAST TECHNIQUE: Contiguous axial images were obtained from the base of the skull through the vertex without intravenous contrast. COMPARISON:  CT 09/07/2016, MR 09/08/2016 FINDINGS: Brain: Stable region of encephalomalacia involving the left thalamus, basal ganglia and external capsule with chronic features compatible with wallerian degeneration along the white matter tracts extending towards the brainstem. Associated asymmetric ex vacuo dilatation of the body of the left lateral ventricle, stable  from prior. No evidence of acute infarction, hemorrhage, hydrocephalus, extra-axial collection, visible mass lesion or mass effect. Symmetric prominence of the ventricles, cisterns and sulci compatible with parenchymal volume loss. Patchy areas of white matter hypoattenuation are most compatible with chronic microvascular angiopathy. Vascular: Atherosclerotic calcification of the carotid siphons and intradural vertebral arteries. No hyperdense vessel. Skull: Some mild right frontal scalp thickening, possible contusive change, correlate for point tenderness. No calvarial fracture or acute osseous abnormalities. Benign hyperostosis frontalis interna. Sinuses/Orbits: Chronic nodular mural thickening throughout the paranasal sinuses minimally within the nasal passages as well as septations seen in the frontal sinuses bilaterally. Some pneumatized layering fluid present in the bilateral mastoids as well. No visible temporal bone fracture within the limitations of this thick slice imaging. Middle ear cavities are clear. Debris in the bilateral external auditory canals. Included orbital structures are unremarkable. Other: None IMPRESSION: 1. Some mild right frontal scalp thickening, possible contusive change, correlate for point tenderness. No calvarial fracture. 2. No acute intracranial abnormality. Specifically no intracranial hemorrhage or extra-axial collection. 3. Stable region of encephalomalacia involving the left thalamus, basal ganglia and external capsule with chronic features compatible with wallerian degeneration hypoattenuation extending towards the brainstem. 4. Chronic microvascular angiopathy and parenchymal volume loss. 5. Chronic nodular mural thickening throughout the paranasal sinuses minimally within the nasal passages as well as septations seen in the frontal sinuses bilaterally. Correlate for features of acute on chronic sinusitis versus sinonasal polyposis. 6. Chronic mastoid effusions. 7. Debris in  the bilateral external auditory canals, correlate for cerumen impaction. Electronically Signed   By: Lovena Le M.D.   On: 07/13/2020 23:25   CT Hip Right Wo Contrast  Result Date: 07/14/2020 CLINICAL DATA:  Fall, hip fracture suspected EXAM: CT OF THE RIGHT HIP WITHOUT CONTRAST TECHNIQUE: Multidetector CT imaging of the right hip was performed according to the standard protocol. Multiplanar CT image reconstructions were also generated. COMPARISON:  Radiograph 07/13/2020, CT abdomen pelvis 11/15/2013 FINDINGS: Bones/Joint/Cartilage The osseous structures appear diffusely demineralized which may limit detection of small or nondisplaced fractures. The questionable band of sclerosis through the femoral neck appears to correspond to projection of normal preserved trabeculation in the femoral neck rather than acute fracture. Moderate joint space narrowing of the right acetabulum with periacetabular spurring. 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 Some minimal in these a pathic changes are noted on the greater and lesser trochanter. Trace hip effusion. Included bones of the pelvis are intact and congruent. Partially included bones of the right hand  are unremarkable aside from some mild interphalangeal arthrosis. Ligaments Suboptimally assessed by CT. Muscles and Tendons Some mild likely disuse atrophy of the musculature. No acute intramuscular hemorrhage or collection. No evidence of retracted torn tendons rather acute intramuscular abnormality is seen. 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 Soft tissues Mild lateral right hip subcutaneous stranding, possibly some contusive change without hematoma. No soft tissue gas or foreign body. Included portions of the  pelvis demonstrate a small fat containing right inguinal hernia. Benign prostate calcifications. IMPRESSION: 1. No convincing evidence of acute fracture or traumatic malalignment. 2. The questionable band of sclerosis through the femoral neck appears to correspond to projection of normal preserved trabeculation in the femoral neck rather than acute fracture. 3. 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 Background of right hip osteoarthrosis. 4. Trace hip effusion. 5. Mild lateral right hip subcutaneous stranding, possibly some contusive change without hematoma. 6. 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 Electronically Signed   By: Lovena Le M.D.   On: 07/14/2020 02:08   US Carotid Duplex Bilateral  Result Date: 07/02/2020 CLINICAL DATA:  Carotid artery disease EXAM: BILATERAL CAROTID DUPLEX ULTRASOUND TECHNIQUE: Pearline Cables scale imaging, color Doppler and duplex ultrasound were performed of bilateral carotid and vertebral arteries in the neck. COMPARISON:  None. FINDINGS: Criteria: Quantification of carotid stenosis is based on velocity parameters that correlate the residual internal carotid diameter with NASCET-based stenosis levels, using the diameter of the distal internal carotid lumen as the denominator for stenosis measurement. The following velocity measurements were obtained: RIGHT ICA: 92/9 cm/sec CCA: 161/09 cm/sec SYSTOLIC ICA/CCA RATIO:  0.8 ECA:  134 cm/sec LEFT ICA: 70/13 cm/sec CCA: 60/45 cm/sec SYSTOLIC ICA/CCA RATIO:  0.7 ECA:  183 cm/sec RIGHT CAROTID ARTERY: Mild heterogeneous atherosclerotic plaque in the proximal internal carotid artery. By peak systolic velocity criteria, the estimated stenosis is less than 50%. RIGHT VERTEBRAL ARTERY:  Patent with normal  antegrade flow. LEFT CAROTID ARTERY: Mild heterogeneous atherosclerotic plaque in the proximal internal carotid artery. By peak systolic velocity criteria, the estimated stenosis is less than 50%. LEFT VERTEBRAL ARTERY:  Patent with normal antegrade flow. IMPRESSION: 1. Mild (1-49%) stenosis proximal right internal carotid artery secondary to heterogenous atherosclerotic plaque. 2. Mild (1-49%) stenosis proximal left internal carotid artery secondary to heterogenous atherosclerotic plaque. 3. Vertebral arteries are patent with normal antegrade flow. Signed, Criselda Peaches, MD, [facility] Electronically Signed   By: Jacqulynn Cadet M.D.   On: 07/02/2020 16:54   DG Knee Complete 4 Views Right  Result Date: 07/13/2020 CLINICAL DATA:  Right knee injury status post fall EXAM: RIGHT KNEE - COMPLETE 4+ VIEW COMPARISON:  None. FINDINGS: No evidence of fracture or dislocation. Small effusion. No evidence of severe arthropathy. No aggressive appearing focal bone abnormality. Soft tissues are unremarkable. IMPRESSION: No acute displaced fracture or dislocation. Electronically Signed   By: Iven Finn M.D.   On: 07/13/2020 17:38   DG Hip Unilat W or Wo Pelvis 2-3 Views Right  Result Date: 07/13/2020 CLINICAL DATA:  Fall, lateral and posterior hip pain EXAM: DG HIP (WITH OR WITHOUT PELVIS) 2-3V RIGHT COMPARISON:  Radiograph 10/01/2018, CT 11/15/2013 FINDINGS: The osseous structures appear diffusely demineralized which may limit detection of  small or nondisplaced fractures. There is a thin band of questionable sclerosis extending through the femoral neck. However there is no discernible cortical step-off or distracted fracture fragments. The femoral head remains normally located within the acetabulum. Degenerative changes present in the spine, hips and pelvis including extensive spurring present at the hips bilaterally with degenerative os  acetabuli as present on comparisons. Remaining bones of the pelvis appear intact and congruent. Arcuate lines are contiguous. Some mild lateral right hip swelling. IMPRESSION: 1. Thin band of questionable sclerosis extending through the right femoral neck. Could reflect a nondisplaced fracture although there is no discernible cortical step-off or distracted fracture fragments. Could consider further evaluation with cross-sectional imaging. 2. Degenerative changes in the spine, hips and pelvis. Electronically Signed   By: Lovena Le M.D.   On: 07/13/2020 23:20      Discharge Exam: Vitals:   07/15/20 2051 07/16/20 0459  BP: (!) 155/80 (!) 159/69  Pulse: 62 (!) 48  Resp: 16 16  Temp: 97.8 F (36.6 C) 98 F (36.7 C)  SpO2: 96% 94%    General: Pt is alert, awake, not in acute distress Cardiovascular: RRR, S1/S2 +, no edema Respiratory: CTA bilaterally, no wheezing, no rhonchi, no respiratory distress, no conversational dyspnea  Abdominal: Soft, NT, ND, bowel sounds + Extremities: no edema, no cyanosis Psych: Normal mood and affect, stable judgement and insight     The results of significant diagnostics from this hospitalization (including imaging, microbiology, ancillary and laboratory) are listed below for reference.     Microbiology: Recent Results (from the past 240 hour(s))  Resp Panel by RT-PCR (Flu A&B, Covid) Nasopharyngeal Swab     Status: None   Collection Time: 07/14/20  3:45 AM   Specimen: Nasopharyngeal Swab; Nasopharyngeal(NP) swabs in vial transport medium  Result Value Ref Range Status   SARS Coronavirus 2 by RT PCR NEGATIVE NEGATIVE Final    Comment: (NOTE) SARS-CoV-2 target nucleic acids are NOT DETECTED.  The SARS-CoV-2 RNA is generally detectable in upper respiratory specimens during the acute phase of infection. The lowest concentration of SARS-CoV-2 viral copies this assay can detect is 138 copies/mL. A negative result does not preclude SARS-Cov-2 infection  and should not be used as the sole basis for treatment or other patient management decisions. A negative result may occur with  improper specimen collection/handling, submission of specimen other than nasopharyngeal swab, presence of viral mutation(s) within the areas targeted by this assay, and inadequate number of viral copies(<138 copies/mL). A negative result must be combined with clinical observations, patient history, and epidemiological information. The expected result is Negative.  Fact Sheet for Patients:  EntrepreneurPulse.com.au  Fact Sheet for Healthcare Providers:  IncredibleEmployment.be  This test is no t yet approved or cleared by the [place] FDA and  has been authorized for detection and/or diagnosis of SARS-CoV-2 by FDA under an Emergency Use Authorization (EUA). This EUA will remain  in effect (meaning this test can be used) for the duration of the COVID-19 declaration under Section 564(b)(1) of the Act, 21 U.S.C.section 360bbb-3(b)(1), unless the authorization is terminated  or revoked sooner.       Influenza A by PCR NEGATIVE NEGATIVE Final   Influenza B by PCR NEGATIVE NEGATIVE Final    Comment: (NOTE) The Xpert Xpress SARS-CoV-2/FLU/RSV plus assay is intended as an aid in the diagnosis of influenza from Nasopharyngeal swab specimens and should not be used as a sole basis for treatment. Nasal washings and aspirates are unacceptable for Xpert Xpress  SARS-CoV-2/FLU/RSV testing.  Fact Sheet for Patients: EntrepreneurPulse.com.au  Fact Sheet for Healthcare Providers: IncredibleEmployment.be  This test is not yet approved or cleared by the [place] FDA and has been authorized for detection and/or diagnosis of SARS-CoV-2 by FDA under an Emergency Use Authorization (EUA). This EUA will remain in effect (meaning this test can be used) for the duration of the COVID-19 declaration  under Section 564(b)(1) of the Act, 21 U.S.C. section 360bbb-3(b)(1), unless the authorization is terminated or revoked.  Performed at [facility], [place] [address], [place], [place] [address]      Labs: BNP (last 3 results) No results for input(s): BNP in the last 8760 hours. Basic Metabolic Panel: Recent Labs  Lab 07/13/20 1429 07/13/20 1657 07/14/20 0803 07/15/20 0331 07/16/20 0333  NA 140 135 137 141 138  K 3.4* 3.2* 2.7* 3.0* 3.3*  CL 97* 97* 98 102 101  CO2 _0 GLUCOSE 316* 387* 270* 202* 199*  BUN _1 CREATININE 1.65* 1.77* 1.67* 1.53* 1.57*  CALCIUM 8.9 8.7* 8.1* 8.5* 8.4*  MG  --   --  1.8 1.8  --    Liver Function Tests: Recent Labs  Lab 07/13/20 1429 07/14/20 0803  AST 15 15  ALT 19 22  ALKPHOS  --  100  BILITOT 0.6 0.8  PROT 6.0* 6.1*  ALBUMIN  --  3.3*   No results for input(s): LIPASE, AMYLASE in the last 168 hours. No results for input(s): AMMONIA in the last 168 hours. CBC: Recent Labs  Lab 07/13/20 1429 07/13/20 1657 07/14/20 0803  WBC 10.2 10.6* 8.0  NEUTROABS 7,630  --  5.0  HGB 13.3 13.3 12.2*  HCT 38.7 40.0 37.3*  MCV 86.6 87.7 89.4  PLT 227 228 175   Cardiac Enzymes: No results for input(s): CKTOTAL, CKMB, CKMBINDEX, TROPONINI in the last 168 hours. BNP: Invalid input(s): POCBNP CBG: Recent Labs  Lab 07/15/20 0743 07/15/20 1127 07/15/20 1637 07/15/20 2054 07/16/20 0727  GLUCAP 196* 278* 128* 176* 185*   D-Dimer No results for input(s): DDIMER in the last 72 hours. Hgb A1c Recent Labs    07/13/20 1429  HGBA1C >14.0*   Lipid Profile No results for input(s): CHOL, HDL, LDLCALC, TRIG, CHOLHDL, LDLDIRECT in the last 72 hours. Thyroid function studies No results for input(s): TSH, T4TOTAL, T3FREE, THYROIDAB in the last 72 hours.  Invalid input(s): FREET3 Anemia work up No results for input(s): VITAMINB12, FOLATE, FERRITIN, TIBC, IRON, RETICCTPCT in the last 72  hours. Urinalysis    Component Value Date/Time   COLORURINE YELLOW 07/14/2020 [place] 07/14/2020 0755   LABSPEC 1.014 07/14/2020 0755   PHURINE 5.0 07/14/2020 0755   GLUCOSEU >=500 (A) 07/14/2020 0755   HGBUR NEGATIVE 07/14/2020 0755   BILIRUBINUR NEGATIVE 07/14/2020 0755   KETONESUR NEGATIVE 07/14/2020 0755   PROTEINUR 100 (A) 07/14/2020 0755   UROBILINOGEN 1.0 11/15/2013 0952   NITRITE NEGATIVE 07/14/2020 0755   LEUKOCYTESUR NEGATIVE 07/14/2020 0755   Sepsis Labs Invalid input(s): PROCALCITONIN,  WBC,  LACTICIDVEN Microbiology Recent Results (from the past 240 hour(s))  Resp Panel by RT-PCR (Flu A&B, Covid) Nasopharyngeal Swab     Status: None   Collection Time: 07/14/20  3:45 AM   Specimen: Nasopharyngeal Swab; Nasopharyngeal(NP) swabs in vial transport medium  Result Value Ref Range Status   SARS Coronavirus 2 by RT PCR NEGATIVE NEGATIVE Final    Comment: (NOTE) SARS-CoV-2 target nucleic acids are NOT  DETECTED.  The SARS-CoV-2 RNA is generally detectable in upper respiratory specimens during the acute phase of infection. The lowest concentration of SARS-CoV-2 viral copies this assay can detect is 138 copies/mL. A negative result does not preclude SARS-Cov-2 infection and should not be used as the sole basis for treatment or other patient management decisions. A negative result may occur with  improper specimen collection/handling, submission of specimen other than nasopharyngeal swab, presence of viral mutation(s) within the areas targeted by this assay, and inadequate number of viral copies(<138 copies/mL). A negative result must be combined with clinical observations, patient history, and epidemiological information. The expected result is Negative.  Fact Sheet for Patients:  EntrepreneurPulse.com.au  Fact Sheet for Healthcare Providers:  IncredibleEmployment.be  This test is no t yet approved or cleared by the  [place] FDA and  has been authorized for detection and/or diagnosis of SARS-CoV-2 by FDA under an Emergency Use Authorization (EUA). This EUA will remain  in effect (meaning this test can be used) for the duration of the COVID-19 declaration under Section 564(b)(1) of the Act, 21 U.S.C.section 360bbb-3(b)(1), unless the authorization is terminated  or revoked sooner.       Influenza A by PCR NEGATIVE NEGATIVE Final   Influenza B by PCR NEGATIVE NEGATIVE Final    Comment: (NOTE) The Xpert Xpress SARS-CoV-2/FLU/RSV plus assay is intended as an aid in the diagnosis of influenza from Nasopharyngeal swab specimens and should not be used as a sole basis for treatment. Nasal washings and aspirates are unacceptable for Xpert Xpress SARS-CoV-2/FLU/RSV testing.  Fact Sheet for Patients: EntrepreneurPulse.com.au  Fact Sheet for Healthcare Providers: IncredibleEmployment.be  This test is not yet approved or cleared by the [place] FDA and has been authorized for detection and/or diagnosis of SARS-CoV-2 by FDA under an Emergency Use Authorization (EUA). This EUA will remain in effect (meaning this test can be used) for the duration of the COVID-19 declaration under Section 564(b)(1) of the Act, 21 U.S.C. section 360bbb-3(b)(1), unless the authorization is terminated or revoked.  Performed at [facility], [place] [address], [place], [place] [address]      Patient was seen and examined on the day of discharge and was found to be in stable condition. Time coordinating discharge: 25 minutes including assessment and coordination of care, as well as examination of the patient.   SIGNED:  Dessa Phi, DO [facility] 07/16/2020, 9:09 AM

## 2020-07-16 NOTE — Plan of Care (Signed)
Patient discharged to [facility] in stable condition. Transported by family in private car

## 2020-07-16 NOTE — TOC Transition Note (Signed)
Transition of Care [facility]) - CM/SW Discharge Note   Patient Details  Name: Eduardo Young MRN: [id number] Date of Birth: 08/15/1955  Transition of Care [facility]) CM/SW Contact:  Lennart Pall, LCSW Phone Number: 07/16/2020, 9:17 AM   Clinical Narrative:    Pt medically cleared for dc today to [facility].  Sister, Eduardo Young, to provide dc transportation.  Have confirmed with facility they are ready to accept.  No further TOC needs.   Final next level of care: Skilled Nursing Facility Barriers to Discharge: Barriers Resolved   Patient Goals and CMS Choice Patient states their goals for this hospitalization and ongoing recovery are:: go to rehab so that I can walk short distance and pivot to my wheel chair CMS Medicare.gov Compare Post Acute Care list provided to:: Patient Choice offered to / list presented to : [place]  Discharge Placement   Existing PASRR number confirmed : 07/15/20          Patient chooses bed at: [facility] Patient to be transferred to facility by: sister Name of family member notified: sister, Eduardo Young @ [phone] Patient and family notified of of transfer: 07/16/20  Discharge Plan and Services In-house Referral: Clinical Social Work   Post Acute Care Choice: [place]          DME Arranged: N/A DME Agency: NA                  Social Determinants of Health ([place]) Interventions     Readmission Risk Interventions No flowsheet data found.

## 2020-07-19 DIAGNOSIS — I10 Essential (primary) hypertension: Secondary | ICD-10-CM | POA: Diagnosis not present

## 2020-07-19 DIAGNOSIS — I779 Disorder of arteries and arterioles, unspecified: Secondary | ICD-10-CM | POA: Diagnosis not present

## 2020-07-19 DIAGNOSIS — G8191 Hemiplegia, unspecified affecting right dominant side: Secondary | ICD-10-CM | POA: Diagnosis not present

## 2020-07-19 DIAGNOSIS — I6930 Unspecified sequelae of cerebral infarction: Secondary | ICD-10-CM | POA: Diagnosis not present

## 2020-07-19 DIAGNOSIS — Z794 Long term (current) use of insulin: Secondary | ICD-10-CM | POA: Diagnosis not present

## 2020-07-19 DIAGNOSIS — E118 Type 2 diabetes mellitus with unspecified complications: Secondary | ICD-10-CM | POA: Diagnosis not present

## 2020-07-19 DIAGNOSIS — N1831 Chronic kidney disease, stage 3a: Secondary | ICD-10-CM | POA: Diagnosis not present

## 2020-07-19 DIAGNOSIS — G4733 Obstructive sleep apnea (adult) (pediatric): Secondary | ICD-10-CM | POA: Diagnosis not present

## 2020-07-19 DIAGNOSIS — I872 Venous insufficiency (chronic) (peripheral): Secondary | ICD-10-CM | POA: Diagnosis not present

## 2020-07-19 DIAGNOSIS — R2689 Other abnormalities of gait and mobility: Secondary | ICD-10-CM | POA: Diagnosis not present

## 2020-07-19 DIAGNOSIS — I69322 Dysarthria following cerebral infarction: Secondary | ICD-10-CM | POA: Diagnosis not present

## 2020-07-20 ENCOUNTER — Ambulatory Visit: Payer: Medicare Other | Admitting: Physical Therapy

## 2020-07-22 ENCOUNTER — Ambulatory Visit: Payer: Medicare Other | Admitting: Physical Therapy

## 2020-07-27 ENCOUNTER — Ambulatory Visit: Payer: Medicare Other | Admitting: Physical Therapy

## 2020-07-29 ENCOUNTER — Ambulatory Visit: Payer: Medicare Other | Admitting: Physical Therapy

## 2020-07-30 DIAGNOSIS — G8191 Hemiplegia, unspecified affecting right dominant side: Secondary | ICD-10-CM | POA: Diagnosis not present

## 2020-07-30 DIAGNOSIS — E118 Type 2 diabetes mellitus with unspecified complications: Secondary | ICD-10-CM | POA: Diagnosis not present

## 2020-07-30 DIAGNOSIS — I10 Essential (primary) hypertension: Secondary | ICD-10-CM | POA: Diagnosis not present

## 2020-07-30 DIAGNOSIS — K5901 Slow transit constipation: Secondary | ICD-10-CM | POA: Diagnosis not present

## 2020-08-02 DIAGNOSIS — G8191 Hemiplegia, unspecified affecting right dominant side: Secondary | ICD-10-CM | POA: Diagnosis not present

## 2020-08-02 DIAGNOSIS — E118 Type 2 diabetes mellitus with unspecified complications: Secondary | ICD-10-CM | POA: Diagnosis not present

## 2020-08-02 DIAGNOSIS — R5381 Other malaise: Secondary | ICD-10-CM | POA: Diagnosis not present

## 2020-08-02 DIAGNOSIS — I6930 Unspecified sequelae of cerebral infarction: Secondary | ICD-10-CM | POA: Diagnosis not present

## 2020-08-07 DIAGNOSIS — I69322 Dysarthria following cerebral infarction: Secondary | ICD-10-CM | POA: Diagnosis not present

## 2020-08-07 DIAGNOSIS — M479 Spondylosis, unspecified: Secondary | ICD-10-CM | POA: Diagnosis not present

## 2020-08-07 DIAGNOSIS — E782 Mixed hyperlipidemia: Secondary | ICD-10-CM | POA: Diagnosis not present

## 2020-08-07 DIAGNOSIS — E1151 Type 2 diabetes mellitus with diabetic peripheral angiopathy without gangrene: Secondary | ICD-10-CM | POA: Diagnosis not present

## 2020-08-07 DIAGNOSIS — I872 Venous insufficiency (chronic) (peripheral): Secondary | ICD-10-CM | POA: Diagnosis not present

## 2020-08-07 DIAGNOSIS — M160 Bilateral primary osteoarthritis of hip: Secondary | ICD-10-CM | POA: Diagnosis not present

## 2020-08-07 DIAGNOSIS — Z794 Long term (current) use of insulin: Secondary | ICD-10-CM | POA: Diagnosis not present

## 2020-08-07 DIAGNOSIS — M109 Gout, unspecified: Secondary | ICD-10-CM | POA: Diagnosis not present

## 2020-08-07 DIAGNOSIS — I779 Disorder of arteries and arterioles, unspecified: Secondary | ICD-10-CM | POA: Diagnosis not present

## 2020-08-07 DIAGNOSIS — G4733 Obstructive sleep apnea (adult) (pediatric): Secondary | ICD-10-CM | POA: Diagnosis not present

## 2020-08-07 DIAGNOSIS — I69351 Hemiplegia and hemiparesis following cerebral infarction affecting right dominant side: Secondary | ICD-10-CM | POA: Diagnosis not present

## 2020-08-07 DIAGNOSIS — I69320 Aphasia following cerebral infarction: Secondary | ICD-10-CM | POA: Diagnosis not present

## 2020-08-07 DIAGNOSIS — B372 Candidiasis of skin and nail: Secondary | ICD-10-CM | POA: Diagnosis not present

## 2020-08-07 DIAGNOSIS — M25561 Pain in right knee: Secondary | ICD-10-CM | POA: Diagnosis not present

## 2020-08-07 DIAGNOSIS — E1122 Type 2 diabetes mellitus with diabetic chronic kidney disease: Secondary | ICD-10-CM | POA: Diagnosis not present

## 2020-08-07 DIAGNOSIS — I129 Hypertensive chronic kidney disease with stage 1 through stage 4 chronic kidney disease, or unspecified chronic kidney disease: Secondary | ICD-10-CM | POA: Diagnosis not present

## 2020-08-07 DIAGNOSIS — N1831 Chronic kidney disease, stage 3a: Secondary | ICD-10-CM | POA: Diagnosis not present

## 2020-08-07 DIAGNOSIS — Z9181 History of falling: Secondary | ICD-10-CM | POA: Diagnosis not present

## 2020-08-07 DIAGNOSIS — Z993 Dependence on wheelchair: Secondary | ICD-10-CM | POA: Diagnosis not present

## 2020-08-09 ENCOUNTER — Inpatient Hospital Stay: Payer: Medicare Other | Admitting: Family Medicine

## 2020-08-10 ENCOUNTER — Other Ambulatory Visit: Payer: Self-pay | Admitting: Family Medicine

## 2020-08-10 ENCOUNTER — Inpatient Hospital Stay: Payer: Medicare Other | Admitting: Family Medicine

## 2020-08-10 DIAGNOSIS — I10 Essential (primary) hypertension: Secondary | ICD-10-CM

## 2020-08-12 ENCOUNTER — Other Ambulatory Visit: Payer: Self-pay

## 2020-08-12 ENCOUNTER — Ambulatory Visit (INDEPENDENT_AMBULATORY_CARE_PROVIDER_SITE_OTHER): Payer: Medicare Other | Admitting: Family Medicine

## 2020-08-12 VITALS — BP 116/68 | HR 70 | Temp 98.4°F

## 2020-08-12 DIAGNOSIS — E1165 Type 2 diabetes mellitus with hyperglycemia: Secondary | ICD-10-CM

## 2020-08-12 DIAGNOSIS — I69351 Hemiplegia and hemiparesis following cerebral infarction affecting right dominant side: Secondary | ICD-10-CM | POA: Diagnosis not present

## 2020-08-12 DIAGNOSIS — Z9181 History of falling: Secondary | ICD-10-CM | POA: Diagnosis not present

## 2020-08-12 DIAGNOSIS — G8111 Spastic hemiplegia affecting right dominant side: Secondary | ICD-10-CM | POA: Diagnosis not present

## 2020-08-12 DIAGNOSIS — I10 Essential (primary) hypertension: Secondary | ICD-10-CM

## 2020-08-12 DIAGNOSIS — R509 Fever, unspecified: Secondary | ICD-10-CM

## 2020-08-12 DIAGNOSIS — N1831 Chronic kidney disease, stage 3a: Secondary | ICD-10-CM | POA: Diagnosis not present

## 2020-08-12 DIAGNOSIS — G4733 Obstructive sleep apnea (adult) (pediatric): Secondary | ICD-10-CM | POA: Diagnosis not present

## 2020-08-12 DIAGNOSIS — B372 Candidiasis of skin and nail: Secondary | ICD-10-CM | POA: Diagnosis not present

## 2020-08-12 DIAGNOSIS — I779 Disorder of arteries and arterioles, unspecified: Secondary | ICD-10-CM | POA: Diagnosis not present

## 2020-08-12 DIAGNOSIS — I69322 Dysarthria following cerebral infarction: Secondary | ICD-10-CM | POA: Diagnosis not present

## 2020-08-12 DIAGNOSIS — E782 Mixed hyperlipidemia: Secondary | ICD-10-CM | POA: Diagnosis not present

## 2020-08-12 DIAGNOSIS — E1151 Type 2 diabetes mellitus with diabetic peripheral angiopathy without gangrene: Secondary | ICD-10-CM | POA: Diagnosis not present

## 2020-08-12 DIAGNOSIS — M160 Bilateral primary osteoarthritis of hip: Secondary | ICD-10-CM | POA: Diagnosis not present

## 2020-08-12 DIAGNOSIS — Z794 Long term (current) use of insulin: Secondary | ICD-10-CM | POA: Diagnosis not present

## 2020-08-12 DIAGNOSIS — E1122 Type 2 diabetes mellitus with diabetic chronic kidney disease: Secondary | ICD-10-CM | POA: Diagnosis not present

## 2020-08-12 DIAGNOSIS — I69320 Aphasia following cerebral infarction: Secondary | ICD-10-CM | POA: Diagnosis not present

## 2020-08-12 DIAGNOSIS — I872 Venous insufficiency (chronic) (peripheral): Secondary | ICD-10-CM | POA: Diagnosis not present

## 2020-08-12 DIAGNOSIS — M109 Gout, unspecified: Secondary | ICD-10-CM | POA: Diagnosis not present

## 2020-08-12 DIAGNOSIS — Z993 Dependence on wheelchair: Secondary | ICD-10-CM | POA: Diagnosis not present

## 2020-08-12 DIAGNOSIS — M25561 Pain in right knee: Secondary | ICD-10-CM | POA: Diagnosis not present

## 2020-08-12 DIAGNOSIS — I129 Hypertensive chronic kidney disease with stage 1 through stage 4 chronic kidney disease, or unspecified chronic kidney disease: Secondary | ICD-10-CM | POA: Diagnosis not present

## 2020-08-12 DIAGNOSIS — M479 Spondylosis, unspecified: Secondary | ICD-10-CM | POA: Diagnosis not present

## 2020-08-13 LAB — COMPLETE METABOLIC PANEL WITH GFR
AG Ratio: 1.1 (calc) (ref 1.0–2.5)
ALT: 17 U/L (ref 9–46)
AST: 15 U/L (ref 10–35)
Albumin: 3 g/dL — ABNORMAL LOW (ref 3.6–5.1)
Alkaline phosphatase (APISO): 91 U/L (ref 35–144)
BUN/Creatinine Ratio: 17 (calc) (ref 6–22)
BUN: 33 mg/dL — ABNORMAL HIGH (ref 7–25)
CO2: 29 mmol/L (ref 20–32)
Calcium: 7.6 mg/dL — ABNORMAL LOW (ref 8.6–10.3)
Chloride: 102 mmol/L (ref 98–110)
Creat: 1.94 mg/dL — ABNORMAL HIGH (ref 0.70–1.25)
GFR, Est African American: 42 mL/min/{1.73_m2} — ABNORMAL LOW (ref >=60)
GFR, Est Non African American: 36 mL/min/{1.73_m2} — ABNORMAL LOW (ref >=60)
Globulin: 2.8 g/dL (calc) (ref 1.9–3.7)
Glucose, Bld: 175 mg/dL — ABNORMAL HIGH (ref 65–99)
Potassium: 3.3 mmol/L — ABNORMAL LOW (ref 3.5–5.3)
Sodium: 141 mmol/L (ref 135–146)
Total Bilirubin: 0.7 mg/dL (ref 0.2–1.2)
Total Protein: 5.8 g/dL — ABNORMAL LOW (ref 6.1–8.1)

## 2020-08-13 LAB — CBC WITH DIFFERENTIAL/PLATELET
Absolute Monocytes: 537 cells/uL (ref 200–950)
Basophils Absolute: 18 cells/uL (ref 0–200)
Basophils Relative: 0.2 %
Eosinophils Absolute: 537 cells/uL — ABNORMAL HIGH (ref 15–500)
Eosinophils Relative: 5.9 %
HCT: 31.8 % — ABNORMAL LOW (ref 38.5–50.0)
Hemoglobin: 10.7 g/dL — ABNORMAL LOW (ref 13.2–17.1)
Lymphs Abs: 1074 cells/uL (ref 850–3900)
MCH: 28.2 pg (ref 27.0–33.0)
MCHC: 33.6 g/dL (ref 32.0–36.0)
MCV: 83.9 fL (ref 80.0–100.0)
MPV: 10.8 fL (ref 7.5–12.5)
Monocytes Relative: 5.9 %
Neutro Abs: 6934 cells/uL (ref 1500–7800)
Neutrophils Relative %: 76.2 %
Platelets: 273 10*3/uL (ref 140–400)
RBC: 3.79 10*6/uL — ABNORMAL LOW (ref 4.20–5.80)
RDW: 14 % (ref 11.0–15.0)
Total Lymphocyte: 11.8 %
WBC: 9.1 10*3/uL (ref 3.8–10.8)

## 2020-08-13 NOTE — Progress Notes (Signed)
Subjective:    Patient ID: Eduardo Young, male    DOB: 03-06-1956, 65 y.o.   MRN: [id number]  Patient is here today with his sister.  Patient was recently admitted to the hospital with right hip pain after a fall.  CT scan of the hip was negative for any fracture.  He was admitted to the hospital due to failure to thrive and inability to ambulate.  Hemoglobin A1c upon admission to the hospital was greater than 14 leading me to believe that he had not been taking his insulin.  Blood sugars in the hospital however stay between 180 and 200.  Patient was discharged to a skilled nursing facility where he had been in rehab for the last few weeks.  He was recently discharged home and is here today for follow-up.  He is now on 56 units of Basaglar daily.  He is not taking any rapid acting insulin.  Sister is now checking his blood sugar regularly.  Fasting blood sugars are between 140 and 180.  2-hour postprandial sugars at night are between 200-250.  Sister had EMS come to the house yesterday.  Apparently since discharge from the skilled nursing facility on Wednesday of last week he has been progressively weak.  His oxygen saturations have been between 91 and 93%.  He denies any shortness of breath or chest pain or cough.  His lungs are clear to auscultation bilaterally today.  He has had generalized muscle pains and his sister was concerned about COVID.  He is starting to feel better.  He denies any dysuria although his urine has had an orange color suggesting that he is dehydrated. Past Medical History:  Diagnosis Date  . Chronic venous insufficiency   . Diabetes mellitus   . GERD (gastroesophageal reflux disease)   . Gout   . Hemiparesis ([place])   . Hyperlipidemia   . Hypertension   . Obesity   . Renal calculi   . Sleep apnea, obstructive   . Stroke [facility])    [id number]   Past Surgical History:  Procedure Laterality Date  . BIOPSY  09/06/2018   Procedure: BIOPSY;  Surgeon: Jerene Bears, MD;   Location: [facility];  Service: Gastroenterology;;  . COLONOSCOPY WITH PROPOFOL N/A 09/06/2018   Procedure: COLONOSCOPY WITH PROPOFOL;  Surgeon: Jerene Bears, MD;  Location: [facility];  Service: Gastroenterology;  Laterality: N/A;  . LITHOTRIPSY    . NASAL POLYP EXCISION     Current Outpatient Medications on File Prior to Visit  Medication Sig Dispense Refill  . ACCU-CHEK FASTCLIX LANCETS MISC Check BS BID DX - E11.9 200 each 3  . allopurinol (ZYLOPRIM) 100 MG tablet Take 1 tablet by mouth twice daily 180 tablet 0  . amLODipine (NORVASC) 10 MG tablet TAKE 1 TABLET BY MOUTH ONCE DAILY **NEEDS  OFFICE  VISIT  BEFORE  ANYMORE  REFILLS** 30 tablet 0  . aspirin 81 MG tablet Take 81 mg by mouth every evening.     Marland Kitchen atorvastatin (LIPITOR) 40 MG tablet Take 1 tablet by mouth once daily 90 tablet 0  . baclofen (LIORESAL) 10 MG tablet Take 0.5-1 tablets (5-10 mg total) by mouth 3 (three) times daily as needed for muscle spasms. 30 each 3  . bisacodyl (BISACODYL) 5 MG EC tablet Take 1 tablet (5 mg total) by mouth daily as needed for moderate constipation. 30 tablet 0  . Blood Glucose Monitoring Suppl (ACCU-CHEK AVIVA PLUS) w/Device KIT Check BS BID DX - E11.9 1 kit  1  . cloNIDine (CATAPRES) 0.2 MG tablet TAKE 1 TABLET BY MOUTH TWICE DAILY **NEEDS  OFFICE  VISIT  BEFORE  ANYMORE  REFILLS** 60 tablet 0  . doxazosin (CARDURA) 4 MG tablet Take 1 tablet by mouth once daily 90 tablet 0  . glucose blood (ACCU-CHEK AVIVA) test strip Check BS BID DX - E11.9 150 each 3  . hydrALAZINE (APRESOLINE) 25 MG tablet TAKE 2 TABLETS BY MOUTH THREE TIMES DAILY 180 tablet 0  . hydrochlorothiazide (HYDRODIURIL) 25 MG tablet Take 1 tablet (25 mg total) by mouth daily. 90 tablet 3  . Insulin Glargine (BASAGLAR KWIKPEN) 100 UNIT/ML Inject 56 Units into the skin daily.    Marland Kitchen labetalol (NORMODYNE) 300 MG tablet Take 1 tablet by mouth twice daily 180 tablet 0  . Lancets Misc. (ACCU-CHEK FASTCLIX LANCET) KIT Check BS BID DX -  E11.9 1 kit 1  . lisinopril (ZESTRIL) 20 MG tablet Take 1 tablet by mouth twice daily 180 tablet 0  . Multiple Vitamin (MULTIVITAMIN WITH MINERALS) TABS tablet Take 1 tablet by mouth daily.    . polyethylene glycol powder (GLYCOLAX/MIRALAX) powder Take 17 g by mouth daily. 510 g 11  . insulin lispro (HUMALOG) 100 UNIT/ML KwikPen INJECT 7 UNITS SUBCUTANEOUSLY ONCE DAILY (Patient not taking: Reported on 08/12/2020) 15 mL 0  . potassium chloride SA (K-DUR) 20 MEQ tablet Take 1 tablet (20 mEq total) by mouth daily. (Patient not taking: Reported on 08/12/2020) 30 tablet 3  . TRADJENTA 5 MG TABS tablet Take 1 tablet by mouth once daily 90 tablet 0   No current facility-administered medications on file prior to visit.   No Known Allergies Social History   Socioeconomic History  . Marital status: Single    Spouse name: Not on file  . Number of children: Not on file  . Years of education: Not on file  . Highest education level: Not on file  Occupational History  . Not on file  Tobacco Use  . Smoking status: Former Research scientist (life sciences)  . Smokeless tobacco: Never Used  Vaping Use  . Vaping Use: Never used  Substance and Sexual Activity  . Alcohol use: Yes    Comment: Occasional  . Drug use: Not Currently    Types: Cocaine, Marijuana  . Sexual activity: Not on file  Other Topics Concern  . Not on file  Social History Narrative  . Not on file   Social Determinants of Health   Financial Resource Strain: Medium Risk  . Difficulty of Paying Living Expenses: Somewhat hard  Food Insecurity: Not on file  Transportation Needs: Not on file  Physical Activity: Not on file  Stress: Not on file  Social Connections: Not on file  Intimate Partner Violence: Not on file      Review of Systems  All other systems reviewed and are negative.      Objective:   Physical Exam Vitals reviewed.  Constitutional:      General: He is not in acute distress.    Appearance: He is well-developed. He is not  diaphoretic.  Cardiovascular:     Rate and Rhythm: Normal rate and regular rhythm.     Heart sounds: Normal heart sounds. No murmur heard.   Pulmonary:     Effort: Pulmonary effort is normal. No respiratory distress.     Breath sounds: Normal breath sounds. No wheezing or rales.  Chest:     Chest wall: No tenderness.  Neurological:     Mental Status: He is alert and  oriented to person, place, and time. Mental status is at baseline.     Cranial Nerves: Cranial nerves are intact.     Motor: Weakness present.     Coordination: Coordination abnormal.     Gait: Gait abnormal.       Uncontrolled type 2 diabetes mellitus with hyperglycemia ([place]) - Plan: CBC with Differential/Platelet, COMPLETE METABOLIC PANEL WITH GFR  Fever, unspecified fever cause - Plan: SARS-COV-2 RNA,(COVID-19) QUAL NAAT  I will screen the patient for COVID given his reported weakness borderline hypoxia.  However his symptoms have been present for 7 days and his sister believes he is gradually starting to improve.  I suspect that he is dehydrated as well.  I will also check a CMP and a CBC.  I want to follow-up as hypokalemia that he had in the hospital and also evaluate for any prerenal azotemia.  I have recommended that we increase Basaglar 1 unit every day until fasting blood sugars are between 120 and 130.  Once we have hit that level they should stop increasing Basaglar.  I anticipate that he will need around 65 units.  Sister will call me in 1 week with an update of where they are at with regards to their insulin titration and what his blood sugars are.  Once his fasting sugars are between 120 and 130 we will then turn our attention to his 2-hour postprandial sugars in the evening.  If they are greater than 200 at that point, we will need to add mealtime insulin at night.  Spent more than 30 minutes today with the sister and the brother explaining this.  However I am encouraged that he is now actually starting to check  his blood sugars.  This was previously a stumbling block for the patient as he refused to check his sugars in the past making insulin titration dangerous.

## 2020-08-14 LAB — SARS-COV-2 RNA,(COVID-19) QUALITATIVE NAAT: SARS CoV2 RNA: DETECTED — AB

## 2020-08-16 ENCOUNTER — Other Ambulatory Visit: Payer: Self-pay | Admitting: *Deleted

## 2020-08-16 DIAGNOSIS — I69320 Aphasia following cerebral infarction: Secondary | ICD-10-CM | POA: Diagnosis not present

## 2020-08-16 DIAGNOSIS — I129 Hypertensive chronic kidney disease with stage 1 through stage 4 chronic kidney disease, or unspecified chronic kidney disease: Secondary | ICD-10-CM | POA: Diagnosis not present

## 2020-08-16 DIAGNOSIS — E1151 Type 2 diabetes mellitus with diabetic peripheral angiopathy without gangrene: Secondary | ICD-10-CM | POA: Diagnosis not present

## 2020-08-16 DIAGNOSIS — I779 Disorder of arteries and arterioles, unspecified: Secondary | ICD-10-CM | POA: Diagnosis not present

## 2020-08-16 DIAGNOSIS — Z993 Dependence on wheelchair: Secondary | ICD-10-CM | POA: Diagnosis not present

## 2020-08-16 DIAGNOSIS — N1831 Chronic kidney disease, stage 3a: Secondary | ICD-10-CM | POA: Diagnosis not present

## 2020-08-16 DIAGNOSIS — I872 Venous insufficiency (chronic) (peripheral): Secondary | ICD-10-CM | POA: Diagnosis not present

## 2020-08-16 DIAGNOSIS — M25561 Pain in right knee: Secondary | ICD-10-CM | POA: Diagnosis not present

## 2020-08-16 DIAGNOSIS — Z794 Long term (current) use of insulin: Secondary | ICD-10-CM | POA: Diagnosis not present

## 2020-08-16 DIAGNOSIS — M479 Spondylosis, unspecified: Secondary | ICD-10-CM | POA: Diagnosis not present

## 2020-08-16 DIAGNOSIS — I69351 Hemiplegia and hemiparesis following cerebral infarction affecting right dominant side: Secondary | ICD-10-CM | POA: Diagnosis not present

## 2020-08-16 DIAGNOSIS — B372 Candidiasis of skin and nail: Secondary | ICD-10-CM | POA: Diagnosis not present

## 2020-08-16 DIAGNOSIS — M109 Gout, unspecified: Secondary | ICD-10-CM | POA: Diagnosis not present

## 2020-08-16 DIAGNOSIS — Z9181 History of falling: Secondary | ICD-10-CM | POA: Diagnosis not present

## 2020-08-16 DIAGNOSIS — E782 Mixed hyperlipidemia: Secondary | ICD-10-CM | POA: Diagnosis not present

## 2020-08-16 DIAGNOSIS — I69322 Dysarthria following cerebral infarction: Secondary | ICD-10-CM | POA: Diagnosis not present

## 2020-08-16 DIAGNOSIS — M160 Bilateral primary osteoarthritis of hip: Secondary | ICD-10-CM | POA: Diagnosis not present

## 2020-08-16 DIAGNOSIS — E1122 Type 2 diabetes mellitus with diabetic chronic kidney disease: Secondary | ICD-10-CM | POA: Diagnosis not present

## 2020-08-16 DIAGNOSIS — G4733 Obstructive sleep apnea (adult) (pediatric): Secondary | ICD-10-CM | POA: Diagnosis not present

## 2020-08-18 ENCOUNTER — Telehealth: Payer: Self-pay | Admitting: Family Medicine

## 2020-08-18 NOTE — Telephone Encounter (Signed)
Pt sister called asking about refill of potassium chloride SA (K-DUR) 20 MEQ tablet    being sent to [facility] on [address] in [place]  Cb# [phone]

## 2020-08-19 ENCOUNTER — Other Ambulatory Visit: Payer: Self-pay | Admitting: Family Medicine

## 2020-08-19 ENCOUNTER — Telehealth: Payer: Self-pay

## 2020-08-19 MED ORDER — POTASSIUM CHLORIDE CRYS ER 20 MEQ PO TBCR: 20.0000 meq | EXTENDED_RELEASE_TABLET | Freq: Every day | ORAL | 3 refills | Status: DC

## 2020-08-19 MED ORDER — TRULICITY 0.75 MG/0.5ML ~~LOC~~ SOAJ: 0.7500 mg | SUBCUTANEOUS | 3 refills | Status: DC

## 2020-08-19 NOTE — Telephone Encounter (Signed)
Prescription sent to pharmacy.

## 2020-08-19 NOTE — Telephone Encounter (Signed)
Eduardo Young called stating that Trulicity is $182 a month which is too expensive.

## 2020-08-20 DIAGNOSIS — I129 Hypertensive chronic kidney disease with stage 1 through stage 4 chronic kidney disease, or unspecified chronic kidney disease: Secondary | ICD-10-CM | POA: Diagnosis not present

## 2020-08-20 DIAGNOSIS — I872 Venous insufficiency (chronic) (peripheral): Secondary | ICD-10-CM | POA: Diagnosis not present

## 2020-08-20 DIAGNOSIS — M479 Spondylosis, unspecified: Secondary | ICD-10-CM | POA: Diagnosis not present

## 2020-08-20 DIAGNOSIS — Z993 Dependence on wheelchair: Secondary | ICD-10-CM | POA: Diagnosis not present

## 2020-08-20 DIAGNOSIS — I69351 Hemiplegia and hemiparesis following cerebral infarction affecting right dominant side: Secondary | ICD-10-CM | POA: Diagnosis not present

## 2020-08-20 DIAGNOSIS — N1831 Chronic kidney disease, stage 3a: Secondary | ICD-10-CM | POA: Diagnosis not present

## 2020-08-20 DIAGNOSIS — G4733 Obstructive sleep apnea (adult) (pediatric): Secondary | ICD-10-CM | POA: Diagnosis not present

## 2020-08-20 DIAGNOSIS — E1122 Type 2 diabetes mellitus with diabetic chronic kidney disease: Secondary | ICD-10-CM | POA: Diagnosis not present

## 2020-08-20 DIAGNOSIS — I69320 Aphasia following cerebral infarction: Secondary | ICD-10-CM | POA: Diagnosis not present

## 2020-08-20 DIAGNOSIS — Z794 Long term (current) use of insulin: Secondary | ICD-10-CM | POA: Diagnosis not present

## 2020-08-20 DIAGNOSIS — M160 Bilateral primary osteoarthritis of hip: Secondary | ICD-10-CM | POA: Diagnosis not present

## 2020-08-20 DIAGNOSIS — I69322 Dysarthria following cerebral infarction: Secondary | ICD-10-CM | POA: Diagnosis not present

## 2020-08-20 DIAGNOSIS — I779 Disorder of arteries and arterioles, unspecified: Secondary | ICD-10-CM | POA: Diagnosis not present

## 2020-08-20 DIAGNOSIS — B372 Candidiasis of skin and nail: Secondary | ICD-10-CM | POA: Diagnosis not present

## 2020-08-20 DIAGNOSIS — M109 Gout, unspecified: Secondary | ICD-10-CM | POA: Diagnosis not present

## 2020-08-20 DIAGNOSIS — E782 Mixed hyperlipidemia: Secondary | ICD-10-CM | POA: Diagnosis not present

## 2020-08-20 DIAGNOSIS — M25561 Pain in right knee: Secondary | ICD-10-CM | POA: Diagnosis not present

## 2020-08-20 DIAGNOSIS — E1151 Type 2 diabetes mellitus with diabetic peripheral angiopathy without gangrene: Secondary | ICD-10-CM | POA: Diagnosis not present

## 2020-08-20 DIAGNOSIS — Z9181 History of falling: Secondary | ICD-10-CM | POA: Diagnosis not present

## 2020-08-20 NOTE — Telephone Encounter (Signed)
Please set the patient up with Gerald Stabs and see if we can get him financial assistance.

## 2020-08-20 NOTE — Telephone Encounter (Signed)
PAP application for Trulicity initiated.  Patient to come in and sign their portion.

## 2020-08-24 ENCOUNTER — Telehealth: Payer: Self-pay

## 2020-08-24 DIAGNOSIS — I872 Venous insufficiency (chronic) (peripheral): Secondary | ICD-10-CM | POA: Diagnosis not present

## 2020-08-24 DIAGNOSIS — Z9181 History of falling: Secondary | ICD-10-CM | POA: Diagnosis not present

## 2020-08-24 DIAGNOSIS — N1831 Chronic kidney disease, stage 3a: Secondary | ICD-10-CM | POA: Diagnosis not present

## 2020-08-24 DIAGNOSIS — M25561 Pain in right knee: Secondary | ICD-10-CM | POA: Diagnosis not present

## 2020-08-24 DIAGNOSIS — G4733 Obstructive sleep apnea (adult) (pediatric): Secondary | ICD-10-CM | POA: Diagnosis not present

## 2020-08-24 DIAGNOSIS — Z993 Dependence on wheelchair: Secondary | ICD-10-CM | POA: Diagnosis not present

## 2020-08-24 DIAGNOSIS — M160 Bilateral primary osteoarthritis of hip: Secondary | ICD-10-CM | POA: Diagnosis not present

## 2020-08-24 DIAGNOSIS — B372 Candidiasis of skin and nail: Secondary | ICD-10-CM | POA: Diagnosis not present

## 2020-08-24 DIAGNOSIS — I69320 Aphasia following cerebral infarction: Secondary | ICD-10-CM | POA: Diagnosis not present

## 2020-08-24 DIAGNOSIS — I129 Hypertensive chronic kidney disease with stage 1 through stage 4 chronic kidney disease, or unspecified chronic kidney disease: Secondary | ICD-10-CM | POA: Diagnosis not present

## 2020-08-24 DIAGNOSIS — E1151 Type 2 diabetes mellitus with diabetic peripheral angiopathy without gangrene: Secondary | ICD-10-CM | POA: Diagnosis not present

## 2020-08-24 DIAGNOSIS — E782 Mixed hyperlipidemia: Secondary | ICD-10-CM | POA: Diagnosis not present

## 2020-08-24 DIAGNOSIS — M479 Spondylosis, unspecified: Secondary | ICD-10-CM | POA: Diagnosis not present

## 2020-08-24 DIAGNOSIS — M109 Gout, unspecified: Secondary | ICD-10-CM | POA: Diagnosis not present

## 2020-08-24 DIAGNOSIS — I69351 Hemiplegia and hemiparesis following cerebral infarction affecting right dominant side: Secondary | ICD-10-CM | POA: Diagnosis not present

## 2020-08-24 DIAGNOSIS — I779 Disorder of arteries and arterioles, unspecified: Secondary | ICD-10-CM | POA: Diagnosis not present

## 2020-08-24 DIAGNOSIS — I69322 Dysarthria following cerebral infarction: Secondary | ICD-10-CM | POA: Diagnosis not present

## 2020-08-24 DIAGNOSIS — Z794 Long term (current) use of insulin: Secondary | ICD-10-CM | POA: Diagnosis not present

## 2020-08-24 DIAGNOSIS — E1122 Type 2 diabetes mellitus with diabetic chronic kidney disease: Secondary | ICD-10-CM | POA: Diagnosis not present

## 2020-08-24 NOTE — Telephone Encounter (Signed)
[facility] called and stated that pt is doing well. He still has PT and OT at the house and he is going to be discharged at the end of the month around 09/16/20. They will be sending over a plan of care from [facility].

## 2020-08-25 DIAGNOSIS — M160 Bilateral primary osteoarthritis of hip: Secondary | ICD-10-CM | POA: Diagnosis not present

## 2020-08-25 DIAGNOSIS — M479 Spondylosis, unspecified: Secondary | ICD-10-CM | POA: Diagnosis not present

## 2020-08-25 DIAGNOSIS — B372 Candidiasis of skin and nail: Secondary | ICD-10-CM | POA: Diagnosis not present

## 2020-08-25 DIAGNOSIS — I779 Disorder of arteries and arterioles, unspecified: Secondary | ICD-10-CM | POA: Diagnosis not present

## 2020-08-25 DIAGNOSIS — G4733 Obstructive sleep apnea (adult) (pediatric): Secondary | ICD-10-CM | POA: Diagnosis not present

## 2020-08-25 DIAGNOSIS — M109 Gout, unspecified: Secondary | ICD-10-CM | POA: Diagnosis not present

## 2020-08-25 DIAGNOSIS — I69351 Hemiplegia and hemiparesis following cerebral infarction affecting right dominant side: Secondary | ICD-10-CM | POA: Diagnosis not present

## 2020-08-25 DIAGNOSIS — I129 Hypertensive chronic kidney disease with stage 1 through stage 4 chronic kidney disease, or unspecified chronic kidney disease: Secondary | ICD-10-CM | POA: Diagnosis not present

## 2020-08-25 DIAGNOSIS — I872 Venous insufficiency (chronic) (peripheral): Secondary | ICD-10-CM | POA: Diagnosis not present

## 2020-08-25 DIAGNOSIS — N1831 Chronic kidney disease, stage 3a: Secondary | ICD-10-CM | POA: Diagnosis not present

## 2020-08-25 DIAGNOSIS — E1122 Type 2 diabetes mellitus with diabetic chronic kidney disease: Secondary | ICD-10-CM | POA: Diagnosis not present

## 2020-08-25 DIAGNOSIS — M25561 Pain in right knee: Secondary | ICD-10-CM | POA: Diagnosis not present

## 2020-08-25 DIAGNOSIS — I69320 Aphasia following cerebral infarction: Secondary | ICD-10-CM | POA: Diagnosis not present

## 2020-08-25 DIAGNOSIS — E782 Mixed hyperlipidemia: Secondary | ICD-10-CM | POA: Diagnosis not present

## 2020-08-25 DIAGNOSIS — Z794 Long term (current) use of insulin: Secondary | ICD-10-CM | POA: Diagnosis not present

## 2020-08-25 DIAGNOSIS — Z9181 History of falling: Secondary | ICD-10-CM | POA: Diagnosis not present

## 2020-08-25 DIAGNOSIS — Z993 Dependence on wheelchair: Secondary | ICD-10-CM | POA: Diagnosis not present

## 2020-08-25 DIAGNOSIS — E1151 Type 2 diabetes mellitus with diabetic peripheral angiopathy without gangrene: Secondary | ICD-10-CM | POA: Diagnosis not present

## 2020-08-25 DIAGNOSIS — I69322 Dysarthria following cerebral infarction: Secondary | ICD-10-CM | POA: Diagnosis not present

## 2020-08-30 ENCOUNTER — Ambulatory Visit: Payer: Self-pay | Admitting: Pharmacist

## 2020-08-30 DIAGNOSIS — Z9181 History of falling: Secondary | ICD-10-CM | POA: Diagnosis not present

## 2020-08-30 DIAGNOSIS — I69351 Hemiplegia and hemiparesis following cerebral infarction affecting right dominant side: Secondary | ICD-10-CM | POA: Diagnosis not present

## 2020-08-30 DIAGNOSIS — E782 Mixed hyperlipidemia: Secondary | ICD-10-CM | POA: Diagnosis not present

## 2020-08-30 DIAGNOSIS — M25561 Pain in right knee: Secondary | ICD-10-CM | POA: Diagnosis not present

## 2020-08-30 DIAGNOSIS — I69322 Dysarthria following cerebral infarction: Secondary | ICD-10-CM | POA: Diagnosis not present

## 2020-08-30 DIAGNOSIS — E1151 Type 2 diabetes mellitus with diabetic peripheral angiopathy without gangrene: Secondary | ICD-10-CM | POA: Diagnosis not present

## 2020-08-30 DIAGNOSIS — I69320 Aphasia following cerebral infarction: Secondary | ICD-10-CM | POA: Diagnosis not present

## 2020-08-30 DIAGNOSIS — I10 Essential (primary) hypertension: Secondary | ICD-10-CM

## 2020-08-30 DIAGNOSIS — E1122 Type 2 diabetes mellitus with diabetic chronic kidney disease: Secondary | ICD-10-CM | POA: Diagnosis not present

## 2020-08-30 DIAGNOSIS — Z993 Dependence on wheelchair: Secondary | ICD-10-CM | POA: Diagnosis not present

## 2020-08-30 DIAGNOSIS — N1831 Chronic kidney disease, stage 3a: Secondary | ICD-10-CM | POA: Diagnosis not present

## 2020-08-30 DIAGNOSIS — Z794 Long term (current) use of insulin: Secondary | ICD-10-CM

## 2020-08-30 DIAGNOSIS — M109 Gout, unspecified: Secondary | ICD-10-CM | POA: Diagnosis not present

## 2020-08-30 DIAGNOSIS — M479 Spondylosis, unspecified: Secondary | ICD-10-CM | POA: Diagnosis not present

## 2020-08-30 DIAGNOSIS — G4733 Obstructive sleep apnea (adult) (pediatric): Secondary | ICD-10-CM | POA: Diagnosis not present

## 2020-08-30 DIAGNOSIS — B372 Candidiasis of skin and nail: Secondary | ICD-10-CM | POA: Diagnosis not present

## 2020-08-30 DIAGNOSIS — I872 Venous insufficiency (chronic) (peripheral): Secondary | ICD-10-CM | POA: Diagnosis not present

## 2020-08-30 DIAGNOSIS — I779 Disorder of arteries and arterioles, unspecified: Secondary | ICD-10-CM | POA: Diagnosis not present

## 2020-08-30 DIAGNOSIS — I129 Hypertensive chronic kidney disease with stage 1 through stage 4 chronic kidney disease, or unspecified chronic kidney disease: Secondary | ICD-10-CM | POA: Diagnosis not present

## 2020-08-30 DIAGNOSIS — M160 Bilateral primary osteoarthritis of hip: Secondary | ICD-10-CM | POA: Diagnosis not present

## 2020-08-30 NOTE — Progress Notes (Signed)
Chronic Care Management Pharmacy Note  08/30/2020 Name:  Eduardo Young MRN:  [id number] DOB:  04-26-1956  Subjective: Eduardo Young is an 65 y.o. year old male who is a primary patient of Pickard, Cammie Mcgee, MD.  The CCM team was consulted for assistance with disease management and care coordination needs.    Patient dropped by the office for follow up in response to provider referral for pharmacy case management and/or care coordination services.   Consent to Services:  The patient was given the following information about Chronic Care Management services today, agreed to services, and gave verbal consent: 1. CCM service includes personalized support from designated clinical staff supervised by the primary care provider, including individualized plan of care and coordination with other care providers 2. 24/7 contact phone numbers for assistance for urgent and routine care needs. 3. Service will only be billed when office clinical staff spend 20 minutes or more in a month to coordinate care. 4. Only one practitioner may furnish and bill the service in a calendar month. 5.The patient may stop CCM services at any time (effective at the end of the month) by phone call to the office staff. 6. The patient will be responsible for cost sharing (co-pay) of up to 20% of the service fee (after annual deductible is met). Patient agreed to services and consent obtained.  Patient Care Team: Susy Frizzle, MD as PCP - General (Family Medicine) Edythe Clarity, [facility] as Pharmacist (Pharmacist)  Objective:  Lab Results  Component Value Date   CREATININE 1.94 (H) 08/12/2020   BUN 33 (H) 08/12/2020   GFRNONAA 36 (L) 08/12/2020   GFRAA 42 (L) 08/12/2020   NA 141 08/12/2020   K 3.3 (L) 08/12/2020   CALCIUM 7.6 (L) 08/12/2020   CO2 29 08/12/2020    Lab Results  Component Value Date/Time   HGBA1C >14.0 (H) 07/13/2020 02:29 PM   HGBA1C 7.3 (H) 09/29/2019 04:03 PM   MICROALBUR 39.1 06/29/2015  10:43 AM   MICROALBUR 59.15 (H) 03/13/2014 11:24 AM    Last diabetic Eye exam: No results found for: HMDIABEYEEXA  Last diabetic Foot exam: No results found for: HMDIABFOOTEX   Lab Results  Component Value Date   CHOL 133 06/07/2017   HDL 22 (L) 06/07/2017   LDLCALC 80 06/07/2017   TRIG 222 (H) 06/07/2017   CHOLHDL 6.0 (H) 06/07/2017    Hepatic Function Latest Ref Rng & Units 08/12/2020 07/14/2020 07/13/2020  Total Protein 6.1 - 8.1 g/dL 5.8(L) 6.1(L) 6.0(L)  Albumin 3.5 - 5.0 g/dL - 3.3(L) -  AST 10 - 35 U/L 15 15 15   ALT 9 - 46 U/L 17 22 19   Alk Phosphatase 38 - 126 U/L - 100 -  Total Bilirubin 0.2 - 1.2 mg/dL 0.7 0.8 0.6    Lab Results  Component Value Date/Time   TSH 3.659 06/29/2015 10:18 AM    CBC Latest Ref Rng & Units 08/12/2020 07/14/2020 07/13/2020  WBC 3.8 - 10.8 Thousand/uL 9.1 8.0 10.6(H)  Hemoglobin 13.2 - 17.1 g/dL 10.7(L) 12.2(L) 13.3  Hematocrit 38.5 - 50.0 % 31.8(L) 37.3(L) 40.0  Platelets 140 - 400 Thousand/uL 273 175 228    No results found for: VD25OH    Depression screen [facility] 2/9 09/03/2017 06/07/2017 10/26/2016  Decreased Interest 0 0 0  Down, Depressed, Hopeless 0 0 0  PHQ - 2 Score 0 0 0  Altered sleeping - - 1  Tired, decreased energy - - 1  Change in appetite - -  0  Feeling bad or failure about yourself  - - 0  Trouble concentrating - - 0  Moving slowly or fidgety/restless - - 0  Suicidal thoughts - - 0  PHQ-9 Score - - 2  Difficult doing work/chores - - Not difficult at all      Social History   Tobacco Use  Smoking Status Former Smoker  Smokeless Tobacco Never Used   BP Readings from Last 3 Encounters:  08/12/20 116/68  07/16/20 (!) 159/69  07/13/20 120/76   Pulse Readings from Last 3 Encounters:  08/12/20 70  07/16/20 (!) 48  07/13/20 73   Wt Readings from Last 3 Encounters:  07/14/20 288 lb 9.3 oz (130.9 kg)  09/06/18 300 lb (136.1 kg)  09/28/16 (!) 305 lb (138.3 kg)    Assessment/Interventions: Review of patient  past medical history, allergies, medications, health status, including review of consultants reports, laboratory and other test data, was performed as part of comprehensive evaluation and provision of chronic care management services.   SDOH:  (Social Determinants of Health) assessments and interventions performed:   CCM Care Plan  No Known Allergies  Medications Reviewed Today    Reviewed by Elyse Jarvis, [place] (Registered Medical Assistant) on 08/12/20 at [place] List Status: <None>  Medication Order Taking? Sig Documenting Provider Last Dose Status Informant  ACCU-CHEK FASTCLIX LANCETS MISC [id number] Yes Check BS BID DX - E11.9 Susy Frizzle, MD Taking Active Spouse/Significant Other  allopurinol (ZYLOPRIM) 100 MG tablet [id number] Yes Take 1 tablet by mouth twice daily Susy Frizzle, MD Taking Active   amLODipine (NORVASC) 10 MG tablet [id number] Yes TAKE 1 TABLET BY MOUTH ONCE DAILY **NEEDS  OFFICE  VISIT  BEFORE  ANYMORE  REFILLS** Susy Frizzle, MD Taking Active   aspirin 81 MG tablet [id number] Yes Take 81 mg by mouth every evening.  [provider] Taking Active Spouse/Significant Other  atorvastatin (LIPITOR) 40 MG tablet [id number] Yes Take 1 tablet by mouth once daily Susy Frizzle, MD Taking Active   baclofen (LIORESAL) 10 MG tablet [id number] Yes Take 0.5-1 tablets (5-10 mg total) by mouth 3 (three) times daily as needed for muscle spasms. Hilts, Michael, MD Taking Active Spouse/Significant Other  bisacodyl (BISACODYL) 5 MG EC tablet [id number] Yes Take 1 tablet (5 mg total) by mouth daily as needed for moderate constipation. Susy Frizzle, MD Taking Active   Blood Glucose Monitoring Suppl (ACCU-CHEK AVIVA PLUS) w/Device KIT [id number] Yes Check BS BID DX - E11.9 Susy Frizzle, MD Taking Active Spouse/Significant Other  cloNIDine (CATAPRES) 0.2 MG tablet [id number] Yes TAKE 1 TABLET BY MOUTH TWICE DAILY **NEEDS  OFFICE  VISIT  BEFORE  ANYMORE  REFILLS**  Susy Frizzle, MD Taking Active   doxazosin (CARDURA) 4 MG tablet [id number] Yes Take 1 tablet by mouth once daily Susy Frizzle, MD Taking Active   glucose blood (ACCU-CHEK AVIVA) test strip [id number] Yes Check BS BID DX - E11.9 Susy Frizzle, MD Taking Active   hydrALAZINE (APRESOLINE) 25 MG tablet [id number] Yes TAKE 2 TABLETS BY MOUTH THREE TIMES DAILY Susy Frizzle, MD Taking Active   hydrochlorothiazide (HYDRODIURIL) 25 MG tablet [id number] Yes Take 1 tablet (25 mg total) by mouth daily. Susy Frizzle, MD Taking Active Spouse/Significant Other  Insulin Glargine [facility]) 100 UNIT/ML [id number] Yes Inject 56 Units into the skin daily. [provider] Taking Active   insulin glargine (LANTUS SOLOSTAR) 100 UNIT/ML Solostar Pen [id number] No Inject 50 Units into  the skin at bedtime.  Patient not taking: Reported on 08/12/2020   Dessa Phi, DO Not Taking Active   insulin lispro (HUMALOG) 100 UNIT/ML KwikPen [id number] No INJECT 7 UNITS SUBCUTANEOUSLY ONCE DAILY  Patient not taking: Reported on 08/12/2020   Susy Frizzle, MD Not Taking Active   labetalol (NORMODYNE) 300 MG tablet [id number] Yes Take 1 tablet by mouth twice daily Susy Frizzle, MD Taking Active   Lancets Misc. (ACCU-CHEK FASTCLIX LANCET) KIT [id number] Yes Check BS BID DX - E11.9 Susy Frizzle, MD Taking Active Spouse/Significant Other  lisinopril (ZESTRIL) 20 MG tablet [id number] Yes Take 1 tablet by mouth twice daily Susy Frizzle, MD Taking Active   Multiple Vitamin (MULTIVITAMIN WITH MINERALS) TABS tablet [id number] Yes Take 1 tablet by mouth daily. [provider] Taking Active Spouse/Significant Other  polyethylene glycol powder (GLYCOLAX/MIRALAX) powder [id number] Yes Take 17 g by mouth daily. Jerene Bears, MD Taking Active   potassium chloride SA (K-DUR) 20 MEQ tablet [id number] No Take 1 tablet (20 mEq total) by mouth daily.  Patient not taking: Reported on 08/12/2020    Susy Frizzle, MD Unknown Active   TRADJENTA 5 MG TABS tablet [id number] No Take 1 tablet by mouth once daily Susy Frizzle, MD Unknown Active   Med List Note Victoriano Lain, CPhT 07/14/20 0037): Wife helps          Patient Active Problem List   Diagnosis Date Noted  . Mixed diabetic hyperlipidemia associated with type 2 diabetes mellitus ([place]) 07/14/2020  . Fall at home, initial encounter 07/14/2020  . [place] intertrigo 07/14/2020  . Right hip pain 07/13/2020  . Pain in right hip 10/14/2019  . Rectal bleeding   . Segmental colitis with complication ([place])   . Adjustment disorder with mixed anxiety and depressed mood   . Benign essential HTN   . Type 2 diabetes mellitus with stage 3a chronic kidney disease, with long-term current use of insulin ([place])   . Chronic kidney disease, stage 3a ([place])   . Spastic hemiparesis of right dominant side ([place]) 09/08/2016  . Weakness   . Myoclonus   . Seizures ([place])   . TIA (transient ischemic attack) 09/07/2016  . Chronic venous insufficiency   . History of cerebral infarction ([place]) 10/21/2010  . DM (diabetes mellitus) ([place]) 10/21/2010  . HLD (hyperlipidemia) 10/21/2010  . Essential hypertension 10/21/2010  . OSA (obstructive sleep apnea) 10/21/2010    Immunization History  Administered Date(s) Administered  . Influenza Whole 04/25/2010  . Influenza,inj,Quad PF,6+ Mos 07/08/2015, 08/22/2016, 06/07/2017, 09/17/2018, 04/01/2019  . Moderna Sars-Covid-2 Vaccination 11/12/2019, 12/10/2019  . Pneumococcal Polysaccharide-23 09/17/2018    Conditions to be addressed/monitored:  DM, HTN  There are no care plans that you recently modified to display for this patient.    Medication Assistance: Application for [employer]  medication assistance program. in process.  Anticipated assistance start date February 2022.  See plan of care for additional detail.  Patient's preferred pharmacy is:  [facility], [address] [place] [address] Phone: [phone] Fax: [phone]  [facility] [address] [place] [address] Phone: [phone] Fax: [phone]   Follow Up:  Patient agrees to Care Plan and Follow-up.  Plan: The patient has been provided with contact information for the care management team and has been advised to  call with any health related questions or concerns.   Beverly Milch, PharmD Clinical Pharmacist [facility] [phone]  Current Barriers:  . Unable to independently afford treatment regimen  Pharmacist Clinical Goal(s):  Marland Kitchen Over the next 30 days, patient will verbalize ability to afford treatment regimen through collaboration with PharmD and provider.  Interventions: . 1:1 collaboration with Susy Frizzle, MD regarding development and update of comprehensive plan of care as evidenced by provider attestation and co-signature . Inter-disciplinary care team collaboration (see longitudinal plan of care) . Comprehensive medication review performed; medication list updated in electronic medical record  Hypertension (BP goal <130/80) -controlled -Current treatment: . Amlodipine 342m . Clonidine 0.255mdaily . Doxazosin 42m69maily . Hydralazine 45m42mily . Labetalol 300mg42m . Lisinopril 20mg 20mications previously tried: none noted -Denies hypotensive/hypertensive symptoms    Diabetes (A1c goal <7%) -uncontrolled -Current medications: . Basaglar 100u/ml kwikpen 56 units daily . Humalog 100u/ml 7 units once daily . Trulicity 0.75mg3.70DU/4.3CVcations previously tried: none noted -Educated onPAP program through [employer] sister to send off application for patient assistance for [employer] Goals/Self-Care Activities . Over the next 30 days,  patient will:  - check glucose at least once daily, document, and provide at future appointments Await word from PharmD or [employer] application status.  Follow Up Plan: The care management team will reach out to the patient again over the next 14 days.

## 2020-08-31 DIAGNOSIS — I779 Disorder of arteries and arterioles, unspecified: Secondary | ICD-10-CM | POA: Diagnosis not present

## 2020-08-31 DIAGNOSIS — I69322 Dysarthria following cerebral infarction: Secondary | ICD-10-CM | POA: Diagnosis not present

## 2020-08-31 DIAGNOSIS — Z993 Dependence on wheelchair: Secondary | ICD-10-CM | POA: Diagnosis not present

## 2020-08-31 DIAGNOSIS — I69351 Hemiplegia and hemiparesis following cerebral infarction affecting right dominant side: Secondary | ICD-10-CM | POA: Diagnosis not present

## 2020-08-31 DIAGNOSIS — M25561 Pain in right knee: Secondary | ICD-10-CM | POA: Diagnosis not present

## 2020-08-31 DIAGNOSIS — I69320 Aphasia following cerebral infarction: Secondary | ICD-10-CM | POA: Diagnosis not present

## 2020-08-31 DIAGNOSIS — N1831 Chronic kidney disease, stage 3a: Secondary | ICD-10-CM | POA: Diagnosis not present

## 2020-08-31 DIAGNOSIS — I872 Venous insufficiency (chronic) (peripheral): Secondary | ICD-10-CM | POA: Diagnosis not present

## 2020-08-31 DIAGNOSIS — Z794 Long term (current) use of insulin: Secondary | ICD-10-CM | POA: Diagnosis not present

## 2020-08-31 DIAGNOSIS — G4733 Obstructive sleep apnea (adult) (pediatric): Secondary | ICD-10-CM | POA: Diagnosis not present

## 2020-08-31 DIAGNOSIS — M479 Spondylosis, unspecified: Secondary | ICD-10-CM | POA: Diagnosis not present

## 2020-08-31 DIAGNOSIS — M160 Bilateral primary osteoarthritis of hip: Secondary | ICD-10-CM | POA: Diagnosis not present

## 2020-08-31 DIAGNOSIS — E1122 Type 2 diabetes mellitus with diabetic chronic kidney disease: Secondary | ICD-10-CM | POA: Diagnosis not present

## 2020-08-31 DIAGNOSIS — M109 Gout, unspecified: Secondary | ICD-10-CM | POA: Diagnosis not present

## 2020-08-31 DIAGNOSIS — E782 Mixed hyperlipidemia: Secondary | ICD-10-CM | POA: Diagnosis not present

## 2020-08-31 DIAGNOSIS — B372 Candidiasis of skin and nail: Secondary | ICD-10-CM | POA: Diagnosis not present

## 2020-08-31 DIAGNOSIS — I129 Hypertensive chronic kidney disease with stage 1 through stage 4 chronic kidney disease, or unspecified chronic kidney disease: Secondary | ICD-10-CM | POA: Diagnosis not present

## 2020-08-31 DIAGNOSIS — Z9181 History of falling: Secondary | ICD-10-CM | POA: Diagnosis not present

## 2020-08-31 DIAGNOSIS — E1151 Type 2 diabetes mellitus with diabetic peripheral angiopathy without gangrene: Secondary | ICD-10-CM | POA: Diagnosis not present

## 2020-09-01 DIAGNOSIS — I69351 Hemiplegia and hemiparesis following cerebral infarction affecting right dominant side: Secondary | ICD-10-CM | POA: Diagnosis not present

## 2020-09-01 DIAGNOSIS — B372 Candidiasis of skin and nail: Secondary | ICD-10-CM | POA: Diagnosis not present

## 2020-09-01 DIAGNOSIS — N1831 Chronic kidney disease, stage 3a: Secondary | ICD-10-CM | POA: Diagnosis not present

## 2020-09-01 DIAGNOSIS — I129 Hypertensive chronic kidney disease with stage 1 through stage 4 chronic kidney disease, or unspecified chronic kidney disease: Secondary | ICD-10-CM | POA: Diagnosis not present

## 2020-09-01 DIAGNOSIS — M109 Gout, unspecified: Secondary | ICD-10-CM | POA: Diagnosis not present

## 2020-09-01 DIAGNOSIS — Z993 Dependence on wheelchair: Secondary | ICD-10-CM | POA: Diagnosis not present

## 2020-09-01 DIAGNOSIS — Z794 Long term (current) use of insulin: Secondary | ICD-10-CM | POA: Diagnosis not present

## 2020-09-01 DIAGNOSIS — Z9181 History of falling: Secondary | ICD-10-CM | POA: Diagnosis not present

## 2020-09-01 DIAGNOSIS — M479 Spondylosis, unspecified: Secondary | ICD-10-CM | POA: Diagnosis not present

## 2020-09-01 DIAGNOSIS — I779 Disorder of arteries and arterioles, unspecified: Secondary | ICD-10-CM | POA: Diagnosis not present

## 2020-09-01 DIAGNOSIS — G4733 Obstructive sleep apnea (adult) (pediatric): Secondary | ICD-10-CM | POA: Diagnosis not present

## 2020-09-01 DIAGNOSIS — E1122 Type 2 diabetes mellitus with diabetic chronic kidney disease: Secondary | ICD-10-CM | POA: Diagnosis not present

## 2020-09-01 DIAGNOSIS — M25561 Pain in right knee: Secondary | ICD-10-CM | POA: Diagnosis not present

## 2020-09-01 DIAGNOSIS — I69320 Aphasia following cerebral infarction: Secondary | ICD-10-CM | POA: Diagnosis not present

## 2020-09-01 DIAGNOSIS — E1151 Type 2 diabetes mellitus with diabetic peripheral angiopathy without gangrene: Secondary | ICD-10-CM | POA: Diagnosis not present

## 2020-09-01 DIAGNOSIS — M160 Bilateral primary osteoarthritis of hip: Secondary | ICD-10-CM | POA: Diagnosis not present

## 2020-09-01 DIAGNOSIS — E782 Mixed hyperlipidemia: Secondary | ICD-10-CM | POA: Diagnosis not present

## 2020-09-01 DIAGNOSIS — I69322 Dysarthria following cerebral infarction: Secondary | ICD-10-CM | POA: Diagnosis not present

## 2020-09-01 DIAGNOSIS — I872 Venous insufficiency (chronic) (peripheral): Secondary | ICD-10-CM | POA: Diagnosis not present

## 2020-09-02 ENCOUNTER — Other Ambulatory Visit: Payer: Self-pay | Admitting: Family Medicine

## 2020-09-02 DIAGNOSIS — M109 Gout, unspecified: Secondary | ICD-10-CM | POA: Diagnosis not present

## 2020-09-02 DIAGNOSIS — M25561 Pain in right knee: Secondary | ICD-10-CM | POA: Diagnosis not present

## 2020-09-02 DIAGNOSIS — E1151 Type 2 diabetes mellitus with diabetic peripheral angiopathy without gangrene: Secondary | ICD-10-CM | POA: Diagnosis not present

## 2020-09-02 DIAGNOSIS — I129 Hypertensive chronic kidney disease with stage 1 through stage 4 chronic kidney disease, or unspecified chronic kidney disease: Secondary | ICD-10-CM | POA: Diagnosis not present

## 2020-09-02 DIAGNOSIS — G4733 Obstructive sleep apnea (adult) (pediatric): Secondary | ICD-10-CM | POA: Diagnosis not present

## 2020-09-02 DIAGNOSIS — M479 Spondylosis, unspecified: Secondary | ICD-10-CM | POA: Diagnosis not present

## 2020-09-02 DIAGNOSIS — E1122 Type 2 diabetes mellitus with diabetic chronic kidney disease: Secondary | ICD-10-CM | POA: Diagnosis not present

## 2020-09-02 DIAGNOSIS — I69320 Aphasia following cerebral infarction: Secondary | ICD-10-CM | POA: Diagnosis not present

## 2020-09-02 DIAGNOSIS — I69322 Dysarthria following cerebral infarction: Secondary | ICD-10-CM | POA: Diagnosis not present

## 2020-09-02 DIAGNOSIS — E782 Mixed hyperlipidemia: Secondary | ICD-10-CM | POA: Diagnosis not present

## 2020-09-02 DIAGNOSIS — Z9181 History of falling: Secondary | ICD-10-CM | POA: Diagnosis not present

## 2020-09-02 DIAGNOSIS — N1831 Chronic kidney disease, stage 3a: Secondary | ICD-10-CM | POA: Diagnosis not present

## 2020-09-02 DIAGNOSIS — B372 Candidiasis of skin and nail: Secondary | ICD-10-CM | POA: Diagnosis not present

## 2020-09-02 DIAGNOSIS — I69351 Hemiplegia and hemiparesis following cerebral infarction affecting right dominant side: Secondary | ICD-10-CM | POA: Diagnosis not present

## 2020-09-02 DIAGNOSIS — Z993 Dependence on wheelchair: Secondary | ICD-10-CM | POA: Diagnosis not present

## 2020-09-02 DIAGNOSIS — I779 Disorder of arteries and arterioles, unspecified: Secondary | ICD-10-CM | POA: Diagnosis not present

## 2020-09-02 DIAGNOSIS — I872 Venous insufficiency (chronic) (peripheral): Secondary | ICD-10-CM | POA: Diagnosis not present

## 2020-09-02 DIAGNOSIS — M160 Bilateral primary osteoarthritis of hip: Secondary | ICD-10-CM | POA: Diagnosis not present

## 2020-09-02 DIAGNOSIS — Z794 Long term (current) use of insulin: Secondary | ICD-10-CM | POA: Diagnosis not present

## 2020-09-03 ENCOUNTER — Telehealth: Payer: Self-pay | Admitting: *Deleted

## 2020-09-03 DIAGNOSIS — M479 Spondylosis, unspecified: Secondary | ICD-10-CM | POA: Diagnosis not present

## 2020-09-03 DIAGNOSIS — I129 Hypertensive chronic kidney disease with stage 1 through stage 4 chronic kidney disease, or unspecified chronic kidney disease: Secondary | ICD-10-CM | POA: Diagnosis not present

## 2020-09-03 DIAGNOSIS — B372 Candidiasis of skin and nail: Secondary | ICD-10-CM | POA: Diagnosis not present

## 2020-09-03 DIAGNOSIS — N1831 Chronic kidney disease, stage 3a: Secondary | ICD-10-CM | POA: Diagnosis not present

## 2020-09-03 DIAGNOSIS — I69351 Hemiplegia and hemiparesis following cerebral infarction affecting right dominant side: Secondary | ICD-10-CM | POA: Diagnosis not present

## 2020-09-03 DIAGNOSIS — Z993 Dependence on wheelchair: Secondary | ICD-10-CM | POA: Diagnosis not present

## 2020-09-03 DIAGNOSIS — Z794 Long term (current) use of insulin: Secondary | ICD-10-CM | POA: Diagnosis not present

## 2020-09-03 DIAGNOSIS — M109 Gout, unspecified: Secondary | ICD-10-CM | POA: Diagnosis not present

## 2020-09-03 DIAGNOSIS — E1151 Type 2 diabetes mellitus with diabetic peripheral angiopathy without gangrene: Secondary | ICD-10-CM | POA: Diagnosis not present

## 2020-09-03 DIAGNOSIS — G4733 Obstructive sleep apnea (adult) (pediatric): Secondary | ICD-10-CM | POA: Diagnosis not present

## 2020-09-03 DIAGNOSIS — I779 Disorder of arteries and arterioles, unspecified: Secondary | ICD-10-CM | POA: Diagnosis not present

## 2020-09-03 DIAGNOSIS — M25561 Pain in right knee: Secondary | ICD-10-CM | POA: Diagnosis not present

## 2020-09-03 DIAGNOSIS — E782 Mixed hyperlipidemia: Secondary | ICD-10-CM | POA: Diagnosis not present

## 2020-09-03 DIAGNOSIS — E1122 Type 2 diabetes mellitus with diabetic chronic kidney disease: Secondary | ICD-10-CM | POA: Diagnosis not present

## 2020-09-03 DIAGNOSIS — Z9181 History of falling: Secondary | ICD-10-CM | POA: Diagnosis not present

## 2020-09-03 DIAGNOSIS — I69322 Dysarthria following cerebral infarction: Secondary | ICD-10-CM | POA: Diagnosis not present

## 2020-09-03 DIAGNOSIS — I69320 Aphasia following cerebral infarction: Secondary | ICD-10-CM | POA: Diagnosis not present

## 2020-09-03 DIAGNOSIS — I872 Venous insufficiency (chronic) (peripheral): Secondary | ICD-10-CM | POA: Diagnosis not present

## 2020-09-03 DIAGNOSIS — M160 Bilateral primary osteoarthritis of hip: Secondary | ICD-10-CM | POA: Diagnosis not present

## 2020-09-03 NOTE — Telephone Encounter (Signed)
Received call from patient caregiver, Manuela Schwartz.   Reports that patient continues to have significant pain in neck from fall. Reports that he remains under care of Ortho, Dr. Debara Pickett and is receiving [facility] PT.   Advised to contact ortho office in regards to pain management until patient is released from care. States that she did contact their office and new medication was sent to the pharmacy, but she has not picked it up at this time.   PCP to be made aware.

## 2020-09-08 DIAGNOSIS — B372 Candidiasis of skin and nail: Secondary | ICD-10-CM | POA: Diagnosis not present

## 2020-09-08 DIAGNOSIS — I779 Disorder of arteries and arterioles, unspecified: Secondary | ICD-10-CM | POA: Diagnosis not present

## 2020-09-08 DIAGNOSIS — I872 Venous insufficiency (chronic) (peripheral): Secondary | ICD-10-CM | POA: Diagnosis not present

## 2020-09-08 DIAGNOSIS — M479 Spondylosis, unspecified: Secondary | ICD-10-CM | POA: Diagnosis not present

## 2020-09-08 DIAGNOSIS — M109 Gout, unspecified: Secondary | ICD-10-CM | POA: Diagnosis not present

## 2020-09-08 DIAGNOSIS — Z794 Long term (current) use of insulin: Secondary | ICD-10-CM | POA: Diagnosis not present

## 2020-09-08 DIAGNOSIS — G4733 Obstructive sleep apnea (adult) (pediatric): Secondary | ICD-10-CM | POA: Diagnosis not present

## 2020-09-08 DIAGNOSIS — E1122 Type 2 diabetes mellitus with diabetic chronic kidney disease: Secondary | ICD-10-CM | POA: Diagnosis not present

## 2020-09-08 DIAGNOSIS — N1831 Chronic kidney disease, stage 3a: Secondary | ICD-10-CM | POA: Diagnosis not present

## 2020-09-08 DIAGNOSIS — E1151 Type 2 diabetes mellitus with diabetic peripheral angiopathy without gangrene: Secondary | ICD-10-CM | POA: Diagnosis not present

## 2020-09-08 DIAGNOSIS — I129 Hypertensive chronic kidney disease with stage 1 through stage 4 chronic kidney disease, or unspecified chronic kidney disease: Secondary | ICD-10-CM | POA: Diagnosis not present

## 2020-09-08 DIAGNOSIS — M25561 Pain in right knee: Secondary | ICD-10-CM | POA: Diagnosis not present

## 2020-09-08 DIAGNOSIS — I69351 Hemiplegia and hemiparesis following cerebral infarction affecting right dominant side: Secondary | ICD-10-CM | POA: Diagnosis not present

## 2020-09-08 DIAGNOSIS — I69322 Dysarthria following cerebral infarction: Secondary | ICD-10-CM | POA: Diagnosis not present

## 2020-09-08 DIAGNOSIS — E782 Mixed hyperlipidemia: Secondary | ICD-10-CM | POA: Diagnosis not present

## 2020-09-08 DIAGNOSIS — Z993 Dependence on wheelchair: Secondary | ICD-10-CM | POA: Diagnosis not present

## 2020-09-08 DIAGNOSIS — Z9181 History of falling: Secondary | ICD-10-CM | POA: Diagnosis not present

## 2020-09-08 DIAGNOSIS — I69320 Aphasia following cerebral infarction: Secondary | ICD-10-CM | POA: Diagnosis not present

## 2020-09-08 DIAGNOSIS — M160 Bilateral primary osteoarthritis of hip: Secondary | ICD-10-CM | POA: Diagnosis not present

## 2020-09-10 DIAGNOSIS — I129 Hypertensive chronic kidney disease with stage 1 through stage 4 chronic kidney disease, or unspecified chronic kidney disease: Secondary | ICD-10-CM | POA: Diagnosis not present

## 2020-09-10 DIAGNOSIS — G4733 Obstructive sleep apnea (adult) (pediatric): Secondary | ICD-10-CM | POA: Diagnosis not present

## 2020-09-10 DIAGNOSIS — B372 Candidiasis of skin and nail: Secondary | ICD-10-CM | POA: Diagnosis not present

## 2020-09-10 DIAGNOSIS — I69320 Aphasia following cerebral infarction: Secondary | ICD-10-CM | POA: Diagnosis not present

## 2020-09-10 DIAGNOSIS — Z9181 History of falling: Secondary | ICD-10-CM | POA: Diagnosis not present

## 2020-09-10 DIAGNOSIS — M160 Bilateral primary osteoarthritis of hip: Secondary | ICD-10-CM | POA: Diagnosis not present

## 2020-09-10 DIAGNOSIS — E1122 Type 2 diabetes mellitus with diabetic chronic kidney disease: Secondary | ICD-10-CM | POA: Diagnosis not present

## 2020-09-10 DIAGNOSIS — N1831 Chronic kidney disease, stage 3a: Secondary | ICD-10-CM | POA: Diagnosis not present

## 2020-09-10 DIAGNOSIS — I779 Disorder of arteries and arterioles, unspecified: Secondary | ICD-10-CM | POA: Diagnosis not present

## 2020-09-10 DIAGNOSIS — I69322 Dysarthria following cerebral infarction: Secondary | ICD-10-CM | POA: Diagnosis not present

## 2020-09-10 DIAGNOSIS — M109 Gout, unspecified: Secondary | ICD-10-CM | POA: Diagnosis not present

## 2020-09-10 DIAGNOSIS — E1151 Type 2 diabetes mellitus with diabetic peripheral angiopathy without gangrene: Secondary | ICD-10-CM | POA: Diagnosis not present

## 2020-09-10 DIAGNOSIS — M25561 Pain in right knee: Secondary | ICD-10-CM | POA: Diagnosis not present

## 2020-09-10 DIAGNOSIS — M479 Spondylosis, unspecified: Secondary | ICD-10-CM | POA: Diagnosis not present

## 2020-09-10 DIAGNOSIS — E782 Mixed hyperlipidemia: Secondary | ICD-10-CM | POA: Diagnosis not present

## 2020-09-10 DIAGNOSIS — Z794 Long term (current) use of insulin: Secondary | ICD-10-CM | POA: Diagnosis not present

## 2020-09-10 DIAGNOSIS — I69351 Hemiplegia and hemiparesis following cerebral infarction affecting right dominant side: Secondary | ICD-10-CM | POA: Diagnosis not present

## 2020-09-10 DIAGNOSIS — I872 Venous insufficiency (chronic) (peripheral): Secondary | ICD-10-CM | POA: Diagnosis not present

## 2020-09-10 DIAGNOSIS — Z993 Dependence on wheelchair: Secondary | ICD-10-CM | POA: Diagnosis not present

## 2020-09-13 ENCOUNTER — Ambulatory Visit (INDEPENDENT_AMBULATORY_CARE_PROVIDER_SITE_OTHER): Payer: Medicare Other | Admitting: Pharmacist

## 2020-09-13 DIAGNOSIS — I10 Essential (primary) hypertension: Secondary | ICD-10-CM | POA: Diagnosis not present

## 2020-09-13 DIAGNOSIS — N1831 Chronic kidney disease, stage 3a: Secondary | ICD-10-CM

## 2020-09-13 DIAGNOSIS — E1122 Type 2 diabetes mellitus with diabetic chronic kidney disease: Secondary | ICD-10-CM | POA: Diagnosis not present

## 2020-09-13 DIAGNOSIS — Z794 Long term (current) use of insulin: Secondary | ICD-10-CM

## 2020-09-13 NOTE — Patient Instructions (Addendum)
Visit Information  Goals Addressed            This Visit's Progress   . Manage My Medicine       Timeframe:  Long-Range Goal Priority:  High Start Date:  09/13/20                           Expected End Date:  03/13/21                     Follow Up Date 12/21/20   - call for medicine refill 2 or 3 days before it runs out - keep a list of all the medicines I take; vitamins and herbals too - use a pillbox to sort medicine    Why is this important?   . These steps will help you keep on track with your medicines.   Notes: Contact providers if there are any cost concerns on your medications.      Patient Care Plan: General Pharmacy (Adult)    Problem Identified: DM, HTN   Priority: High  Onset Date: 09/13/2020    Long-Range Goal: Patient-Specific Goal   Start Date: 09/13/2020  Expected End Date: 03/13/2021  This Visit's Progress: On track  Priority: High  Note:   Current Barriers:  . Unable to independently afford treatment regimen  Pharmacist Clinical Goal(s):  Marland Kitchen Over the next 30 days, patient will verbalize ability to afford treatment regimen through collaboration with PharmD and provider.  Interventions: . 1:1 collaboration with Susy Frizzle, MD regarding development and update of comprehensive plan of care as evidenced by provider attestation and co-signature . Inter-disciplinary care team collaboration (see longitudinal plan of care) . Comprehensive medication review performed; medication list updated in electronic medical record  Hypertension (BP goal <130/80) -controlled -Current treatment: . Amlodipine 8m . Clonidine 0.222mdaily . Doxazosin 47m15maily . Hydralazine 71m6mily . Labetalol 300mg31m . Lisinopril 20mg 447mications previously tried: none noted -Denies hypotensive/hypertensive symptoms    Diabetes (A1c goal <7%) -uncontrolled -Current medications: . Basaglar 100u/ml kwikpen 56 units daily . Humalog 100u/ml 7 units once  daily . Trulicity 0.75mg3.00TM/2.2QJcations previously tried: none noted -Educated onPAP program through [employer] Calhan/21 -Contac[place]entsvillerogram to determine status of application with Trulicity.  Representative notified me that he is approved through 07/23/21.  Will need to renew both Basaglar and Trulicity at that time.   Patient Goals/Self-Care Activities . Over the next 30 days, patient will:  - take medications as prescribed check glucose daily, document, and provide at future appointments  Confirm receipt of Trulicity through PAP  Follow Up Plan: The care management team will reach out to the patient again over the next 30 days.         The patient verbalized understanding of instructions, educational materials, and care plan provided today and agreed to receive a mailed copy of patient instructions, educational materials, and care plan.  Telephone follow up appointment with pharmacy team member scheduled for: 2 months  ChristEdythe Clarity BCalvertdicine Management Taking your medicines correctly is an important part of managing or preventing medical problems. Make sure you know what disease or condition your medicine is treating, and how and when to take it. If you do not take your medicine correctly, it may not work well and may cause unpleasant side effects, including serious health problems. What should I do when I am taking medicines?  Read all the labels and inserts that come with your medicines. Review the information often.  Talk with your pharmacist if you get a refill and notice a change in the size, color, or shape of your medicines.  Know the potential side effects for each medicine that you take.  Try to get all your medicines from the same pharmacy. The pharmacist will have all your information and will understand how your medicines will affect each other (interact).  Tell your health care provider about all your medicines,  including over-the-counter medicines, vitamins, and herbal or dietary supplements. He or she will make sure that nothing will interact with any of your prescribed medicines.   How can I take my medicines safely?  Take medicines only as told by your health care provider. ? Do not take more of your medicine than instructed. ? Do not take anyone else's medicines. ? Do not share your medicines with others. ? Do not stop taking your medicines unless your health care provider tells you to do so. ? You may need to avoid alcohol or certain foods or liquids when taking certain medicines. Follow your health care provider's instructions.  Do not split, mash, or chew your medicines unless your health care provider tells you to do so. Tell your health care provider if you have trouble swallowing your medicines.  For liquid medicine, use the dosing container that was provided. How should I organize my medicines? Know your medicines  Know what each of your medicines looks like. This includes size, color, and shape. Tell your health care provider if you are having trouble recognizing all the medicines that you are taking.  If you cannot tell your medicines apart because they look similar, keep them in original bottles.  If you cannot read the labels on the bottles, tell your pharmacist to put your medicines in containers with large print.  Review your medicines and your schedule with family members, a friend, or a caregiver. Use a pill organizer  Use a tool to organize your medicine schedule. Tools include a weekly pillbox, a written chart, a notebook, or a calendar.  Your tool should help you remember the following things about each medicine: ? The name of the medicine. ? The amount (dose) to take. ? The schedule. This is the day and time the medicine should be taken. ? The appearance. This includes color, shape, size, and stamp. ? How to take your medicines. This includes instructions to take them  with food, without food, with fluids, or with other medicines.  Create reminders for taking your medicines. Use sticky notes, or alarms on your watch, mobile device, or phone calendar.  You may choose to use a more advanced management system. These systems have storage, alarms, and visual and audio prompts.  Some medicines can be taken on an "as-needed" basis. These include medicines for nausea or pain. If you take an as-needed medicine, write down the name and dose, as well as the date and time that you took it.   How should I plan for travel?  Take your pillbox, medicines, and organization system with you when traveling.  Have your medicines refilled before you travel. This will ensure that you do not run out of your medicines while you are away from home.  Always carry an updated list of your medicines with you. If there is an emergency, a first responder can quickly see what medicines you are taking.  Do not pack your medicines in checked luggage in case  your luggage is lost or delayed.  If any of your medicines is considered a controlled substance, make sure you bring a letter from your health care provider with you. How should I store and discard my medicines? For safe storage:  Store medicines in a cool, dry area away from light, or as directed by your health care provider. Do not store medicines in the bathroom. Heat and humidity will affect them.  Do not store your medicines with other chemicals, or with medicines for pets or other household members.  Keep medicines away from children and pets. Do not leave them on counters or bedside tables. Store them in high cabinets or on high shelves. For safe disposal:  Check expiration dates regularly. Do not take expired medicines. Discard medicines that are older than the expiration date.  Learn a safe way to dispose of your medicines. You may: ? Use a local government, hospital, or pharmacy medicine-take-back program. ? Mix the  medicines with inedible substances, put them in a sealed bag or empty container, and throw them in the trash. What should I remember?  Tell your health care provider if you: ? Experience side effects. ? Have new symptoms. ? Have other concerns about taking your medicines.  Review your medicines regularly with your health care provider. Other medicines, diet, medical conditions, weight changes, and daily habits can all affect how medicines work. Ask if you need to continue taking each medicine, and discuss how well each one is working.  Refill your medicines early to avoid running out of them.  In case of an accidental overdose, call your local [place] at [phone] or visit your local emergency department immediately. This is important. Summary  Taking your medicines correctly is an important part of managing or preventing medical problems.  You need to make sure that you understand what you are taking a medicine for, as well as how and when you need to take it.  Know your medicines and use a pill organizer to help you take your medicines correctly.  In case of an accidental overdose, call your local [place] at [phone] or visit your local emergency department immediately. This is important. This information is not intended to replace advice given to you by your health care provider. Make sure you discuss any questions you have with your health care provider. Document Revised: 07/05/2017 Document Reviewed: 07/05/2017 Elsevier Patient Education  2021 [employer].

## 2020-09-13 NOTE — Progress Notes (Signed)
Chronic Care Management Pharmacy Note  09/13/2020 Name:  Eduardo Young MRN:  [id number] DOB:  [birth year]  Subjective: Eduardo Young is an 65 y.o. year old male who is a primary patient of Pickard, Cammie Mcgee, MD.  The CCM team was consulted for assistance with disease management and care coordination needs.    Patient dropped by the office for follow up in response to provider referral for pharmacy case management and/or care coordination services.   Consent to Services:  The patient was given the following information about Chronic Care Management services today, agreed to services, and gave verbal consent: 1. CCM service includes personalized support from designated clinical staff supervised by the primary care provider, including individualized plan of care and coordination with other care providers 2. 24/7 contact phone numbers for assistance for urgent and routine care needs. 3. Service will only be billed when office clinical staff spend 20 minutes or more in a month to coordinate care. 4. Only one practitioner may furnish and bill the service in a calendar month. 5.The patient may stop CCM services at any time (effective at the end of the month) by phone call to the office staff. 6. The patient will be responsible for cost sharing (co-pay) of up to 20% of the service fee (after annual deductible is met). Patient agreed to services and consent obtained.  Patient Care Team: Susy Frizzle, MD as PCP - General (Family Medicine) Edythe Clarity, [facility] as Pharmacist (Pharmacist)  Objective:  Lab Results  Component Value Date   CREATININE 1.94 (H) 08/12/2020   BUN 33 (H) 08/12/2020   GFRNONAA 36 (L) 08/12/2020   GFRAA 42 (L) 08/12/2020   NA 141 08/12/2020   K 3.3 (L) 08/12/2020   CALCIUM 7.6 (L) 08/12/2020   CO2 29 08/12/2020    Lab Results  Component Value Date/Time   HGBA1C >14.0 (H) 07/13/2020 02:29 PM   HGBA1C 7.3 (H) 09/29/2019 04:03 PM   MICROALBUR 39.1 06/29/2015  10:43 AM   MICROALBUR 59.15 (H) 03/13/2014 11:24 AM    Last diabetic Eye exam: No results found for: HMDIABEYEEXA  Last diabetic Foot exam: No results found for: HMDIABFOOTEX   Lab Results  Component Value Date   CHOL 133 06/07/2017   HDL 22 (L) 06/07/2017   LDLCALC 80 06/07/2017   TRIG 222 (H) 06/07/2017   CHOLHDL 6.0 (H) 06/07/2017    Hepatic Function Latest Ref Rng & Units 08/12/2020 07/14/2020 07/13/2020  Total Protein 6.1 - 8.1 g/dL 5.8(L) 6.1(L) 6.0(L)  Albumin 3.5 - 5.0 g/dL - 3.3(L) -  AST 10 - 35 U/L 15 15 15   ALT 9 - 46 U/L 17 22 19   Alk Phosphatase 38 - 126 U/L - 100 -  Total Bilirubin 0.2 - 1.2 mg/dL 0.7 0.8 0.6    Lab Results  Component Value Date/Time   TSH 3.659 06/29/2015 10:18 AM    CBC Latest Ref Rng & Units 08/12/2020 07/14/2020 07/13/2020  WBC 3.8 - 10.8 Thousand/uL 9.1 8.0 10.6(H)  Hemoglobin 13.2 - 17.1 g/dL 10.7(L) 12.2(L) 13.3  Hematocrit 38.5 - 50.0 % 31.8(L) 37.3(L) 40.0  Platelets 140 - 400 Thousand/uL 273 175 228    No results found for: VD25OH    Depression screen [facility] 2/9 09/03/2017 06/07/2017 10/26/2016  Decreased Interest 0 0 0  Down, Depressed, Hopeless 0 0 0  PHQ - 2 Score 0 0 0  Altered sleeping - - 1  Tired, decreased energy - - 1  Change in appetite - -  0  Feeling bad or failure about yourself  - - 0  Trouble concentrating - - 0  Moving slowly or fidgety/restless - - 0  Suicidal thoughts - - 0  PHQ-9 Score - - 2  Difficult doing work/chores - - Not difficult at all      Social History   Tobacco Use  Smoking Status Former Smoker  Smokeless Tobacco Never Used   BP Readings from Last 3 Encounters:  08/12/20 116/68  07/16/20 (!) 159/69  07/13/20 120/76   Pulse Readings from Last 3 Encounters:  08/12/20 70  07/16/20 (!) 48  07/13/20 73   Wt Readings from Last 3 Encounters:  07/14/20 288 lb 9.3 oz (130.9 kg)  09/06/18 300 lb (136.1 kg)  09/28/16 (!) 305 lb (138.3 kg)    Assessment/Interventions: Review of patient  past medical history, allergies, medications, health status, including review of consultants reports, laboratory and other test data, was performed as part of comprehensive evaluation and provision of chronic care management services.   SDOH:  (Social Determinants of Health) assessments and interventions performed:   CCM Care Plan  No Known Allergies  Medications Reviewed Today    Reviewed by Elyse Jarvis, [place] (Registered Medical Assistant) on 08/12/20 at [employer] List Status: <None>  Medication Order Taking? Sig Documenting Provider Last Dose Status Informant  ACCU-CHEK FASTCLIX LANCETS MISC [id number] Yes Check BS BID DX - E11.9 Susy Frizzle, MD Taking Active Spouse/Significant Other  allopurinol (ZYLOPRIM) 100 MG tablet [id number] Yes Take 1 tablet by mouth twice daily Susy Frizzle, MD Taking Active   amLODipine (NORVASC) 10 MG tablet [id number] Yes TAKE 1 TABLET BY MOUTH ONCE DAILY **NEEDS  OFFICE  VISIT  BEFORE  ANYMORE  REFILLS** Susy Frizzle, MD Taking Active   aspirin 81 MG tablet [id number] Yes Take 81 mg by mouth every evening.  [provider] Taking Active Spouse/Significant Other  atorvastatin (LIPITOR) 40 MG tablet [id number] Yes Take 1 tablet by mouth once daily Susy Frizzle, MD Taking Active   baclofen (LIORESAL) 10 MG tablet [id number] Yes Take 0.5-1 tablets (5-10 mg total) by mouth 3 (three) times daily as needed for muscle spasms. Hilts, Michael, MD Taking Active Spouse/Significant Other  bisacodyl (BISACODYL) 5 MG EC tablet [id number] Yes Take 1 tablet (5 mg total) by mouth daily as needed for moderate constipation. Susy Frizzle, MD Taking Active   Blood Glucose Monitoring Suppl (ACCU-CHEK AVIVA PLUS) w/Device KIT [id number] Yes Check BS BID DX - E11.9 Susy Frizzle, MD Taking Active Spouse/Significant Other  cloNIDine (CATAPRES) 0.2 MG tablet [id number] Yes TAKE 1 TABLET BY MOUTH TWICE DAILY **NEEDS  OFFICE  VISIT  BEFORE  ANYMORE  REFILLS**  Susy Frizzle, MD Taking Active   doxazosin (CARDURA) 4 MG tablet [id number] Yes Take 1 tablet by mouth once daily Susy Frizzle, MD Taking Active   glucose blood (ACCU-CHEK AVIVA) test strip [id number] Yes Check BS BID DX - E11.9 Susy Frizzle, MD Taking Active   hydrALAZINE (APRESOLINE) 25 MG tablet [id number] Yes TAKE 2 TABLETS BY MOUTH THREE TIMES DAILY Susy Frizzle, MD Taking Active   hydrochlorothiazide (HYDRODIURIL) 25 MG tablet [id number] Yes Take 1 tablet (25 mg total) by mouth daily. Susy Frizzle, MD Taking Active Spouse/Significant Other  Insulin Glargine [facility]) 100 UNIT/ML [id number] Yes Inject 56 Units into the skin daily. [provider] Taking Active   insulin glargine (LANTUS SOLOSTAR) 100 UNIT/ML Solostar Pen [id number] No Inject 50 Units into  the skin at bedtime.  Patient not taking: Reported on 08/12/2020   Dessa Phi, DO Not Taking Active   insulin lispro (HUMALOG) 100 UNIT/ML KwikPen [id number] No INJECT 7 UNITS SUBCUTANEOUSLY ONCE DAILY  Patient not taking: Reported on 08/12/2020   Susy Frizzle, MD Not Taking Active   labetalol (NORMODYNE) 300 MG tablet [id number] Yes Take 1 tablet by mouth twice daily Susy Frizzle, MD Taking Active   Lancets Misc. (ACCU-CHEK FASTCLIX LANCET) KIT [id number] Yes Check BS BID DX - E11.9 Susy Frizzle, MD Taking Active Spouse/Significant Other  lisinopril (ZESTRIL) 20 MG tablet [id number] Yes Take 1 tablet by mouth twice daily Susy Frizzle, MD Taking Active   Multiple Vitamin (MULTIVITAMIN WITH MINERALS) TABS tablet [id number] Yes Take 1 tablet by mouth daily. [provider] Taking Active Spouse/Significant Other  polyethylene glycol powder (GLYCOLAX/MIRALAX) powder [id number] Yes Take 17 g by mouth daily. Jerene Bears, MD Taking Active   potassium chloride SA (K-DUR) 20 MEQ tablet [id number] No Take 1 tablet (20 mEq total) by mouth daily.  Patient not taking: Reported on 08/12/2020    Susy Frizzle, MD Unknown Active   TRADJENTA 5 MG TABS tablet [id number] No Take 1 tablet by mouth once daily Susy Frizzle, MD Unknown Active   Med List Note Victoriano Lain, CPhT 07/14/20 0037): Wife helps          Patient Active Problem List   Diagnosis Date Noted  . Mixed diabetic hyperlipidemia associated with type 2 diabetes mellitus ([place]) 07/14/2020  . Fall at home, initial encounter 07/14/2020  . Candidal intertrigo 07/14/2020  . Right hip pain 07/13/2020  . Pain in right hip 10/14/2019  . Rectal bleeding   . Segmental colitis with complication ([place])   . Adjustment disorder with mixed anxiety and depressed mood   . Benign essential HTN   . Type 2 diabetes mellitus with stage 3a chronic kidney disease, with long-term current use of insulin ([place])   . Chronic kidney disease, stage 3a ([place])   . Spastic hemiparesis of right dominant side ([place]) 09/08/2016  . Weakness   . Myoclonus   . Seizures ([place])   . TIA (transient ischemic attack) 09/07/2016  . Chronic venous insufficiency   . History of cerebral infarction ([place]) 10/21/2010  . DM (diabetes mellitus) ([place]) 10/21/2010  . HLD (hyperlipidemia) 10/21/2010  . Essential hypertension 10/21/2010  . OSA (obstructive sleep apnea) 10/21/2010    Immunization History  Administered Date(s) Administered  . Influenza Whole 04/25/2010  . Influenza,inj,Quad PF,6+ Mos 07/08/2015, 08/22/2016, 06/07/2017, 09/17/2018, 04/01/2019  . Moderna Sars-Covid-2 Vaccination 11/12/2019, 12/10/2019  . Pneumococcal Polysaccharide-23 09/17/2018    Conditions to be addressed/monitored:  DM, HTN  Care Plan : General Pharmacy (Adult)  Updates made by Edythe Clarity, RPH since 09/13/2020 12:00 AM    Problem: DM, HTN   Priority: High  Onset Date: 09/13/2020    Long-Range Goal: Patient-Specific Goal   Start Date: 09/13/2020  Expected End Date: 03/13/2021  This Visit's Progress: On track  Priority: High  Note:   Current Barriers:   . Unable to independently afford treatment regimen  Pharmacist Clinical Goal(s):  Marland Kitchen Over the next 30 days, patient will verbalize ability to afford treatment regimen through collaboration with PharmD and provider.  Interventions: . 1:1 collaboration with Susy Frizzle, MD regarding development and update of comprehensive plan of care as evidenced by provider attestation and co-signature . Inter-disciplinary care team collaboration (see longitudinal plan of care) . Comprehensive  medication review performed; medication list updated in electronic medical record  Hypertension (BP goal <130/80) -controlled -Current treatment: . Amlodipine 45m . Clonidine 0.260mdaily . Doxazosin 59m63maily . Hydralazine 17m53mily . Labetalol 300mg659m . Lisinopril 20mg 659mications previously tried: none noted -Denies hypotensive/hypertensive symptoms    Diabetes (A1c goal <7%) -uncontrolled -Current medications: . Basaglar 100u/ml kwikpen 56 units daily . Humalog 100u/ml 7 units once daily . Trulicity 0.75mg6.81PT/4.7MRcations previously tried: none noted -Educated onPAP program through [employer]/21 -[place] to determine status of application with Trulicity.  Representative notified me that he is approved through 07/23/21.  Will need to renew both Basaglar and Trulicity at that time.   Patient Goals/Self-Care Activities . Over the next 30 days, patient will:  - take medications as prescribed check glucose daily, document, and provide at future appointments  Confirm receipt of Trulicity through PAP  Follow Up Plan: The care management team will reach out to the patient again over the next 30 days.         Medication Assistance: PAP for Basaglar and Trulicity obtained through [employer] assistance program.  Enrollment ends 07/23/21.  Patient[facility]  [address] [place] [address]: [phone] [address] [address]: [phone] Up:  Patient agrees to Care Plan and Follow-up.  Plan: The patient has been provided with contact information for the care management team and has been advised to call with any health related questions or concerns.   ChristBeverly MilchmD Clinical Pharmacist [place] [phone]

## 2020-09-14 ENCOUNTER — Other Ambulatory Visit: Payer: Self-pay | Admitting: Family Medicine

## 2020-09-14 DIAGNOSIS — Z9181 History of falling: Secondary | ICD-10-CM | POA: Diagnosis not present

## 2020-09-14 DIAGNOSIS — G4733 Obstructive sleep apnea (adult) (pediatric): Secondary | ICD-10-CM | POA: Diagnosis not present

## 2020-09-14 DIAGNOSIS — I69322 Dysarthria following cerebral infarction: Secondary | ICD-10-CM | POA: Diagnosis not present

## 2020-09-14 DIAGNOSIS — M160 Bilateral primary osteoarthritis of hip: Secondary | ICD-10-CM | POA: Diagnosis not present

## 2020-09-14 DIAGNOSIS — M25561 Pain in right knee: Secondary | ICD-10-CM | POA: Diagnosis not present

## 2020-09-14 DIAGNOSIS — E1151 Type 2 diabetes mellitus with diabetic peripheral angiopathy without gangrene: Secondary | ICD-10-CM | POA: Diagnosis not present

## 2020-09-14 DIAGNOSIS — I69320 Aphasia following cerebral infarction: Secondary | ICD-10-CM | POA: Diagnosis not present

## 2020-09-14 DIAGNOSIS — N1831 Chronic kidney disease, stage 3a: Secondary | ICD-10-CM | POA: Diagnosis not present

## 2020-09-14 DIAGNOSIS — Z993 Dependence on wheelchair: Secondary | ICD-10-CM | POA: Diagnosis not present

## 2020-09-14 DIAGNOSIS — E1122 Type 2 diabetes mellitus with diabetic chronic kidney disease: Secondary | ICD-10-CM | POA: Diagnosis not present

## 2020-09-14 DIAGNOSIS — M109 Gout, unspecified: Secondary | ICD-10-CM | POA: Diagnosis not present

## 2020-09-14 DIAGNOSIS — I779 Disorder of arteries and arterioles, unspecified: Secondary | ICD-10-CM | POA: Diagnosis not present

## 2020-09-14 DIAGNOSIS — I872 Venous insufficiency (chronic) (peripheral): Secondary | ICD-10-CM | POA: Diagnosis not present

## 2020-09-14 DIAGNOSIS — B372 Candidiasis of skin and nail: Secondary | ICD-10-CM | POA: Diagnosis not present

## 2020-09-14 DIAGNOSIS — I10 Essential (primary) hypertension: Secondary | ICD-10-CM

## 2020-09-14 DIAGNOSIS — I129 Hypertensive chronic kidney disease with stage 1 through stage 4 chronic kidney disease, or unspecified chronic kidney disease: Secondary | ICD-10-CM | POA: Diagnosis not present

## 2020-09-14 DIAGNOSIS — E782 Mixed hyperlipidemia: Secondary | ICD-10-CM | POA: Diagnosis not present

## 2020-09-14 DIAGNOSIS — I69351 Hemiplegia and hemiparesis following cerebral infarction affecting right dominant side: Secondary | ICD-10-CM | POA: Diagnosis not present

## 2020-09-14 DIAGNOSIS — M479 Spondylosis, unspecified: Secondary | ICD-10-CM | POA: Diagnosis not present

## 2020-09-14 DIAGNOSIS — Z794 Long term (current) use of insulin: Secondary | ICD-10-CM | POA: Diagnosis not present

## 2020-09-15 DIAGNOSIS — Z993 Dependence on wheelchair: Secondary | ICD-10-CM | POA: Diagnosis not present

## 2020-09-15 DIAGNOSIS — I872 Venous insufficiency (chronic) (peripheral): Secondary | ICD-10-CM | POA: Diagnosis not present

## 2020-09-15 DIAGNOSIS — B372 Candidiasis of skin and nail: Secondary | ICD-10-CM | POA: Diagnosis not present

## 2020-09-15 DIAGNOSIS — M160 Bilateral primary osteoarthritis of hip: Secondary | ICD-10-CM | POA: Diagnosis not present

## 2020-09-15 DIAGNOSIS — M25561 Pain in right knee: Secondary | ICD-10-CM | POA: Diagnosis not present

## 2020-09-15 DIAGNOSIS — I779 Disorder of arteries and arterioles, unspecified: Secondary | ICD-10-CM | POA: Diagnosis not present

## 2020-09-15 DIAGNOSIS — Z9181 History of falling: Secondary | ICD-10-CM | POA: Diagnosis not present

## 2020-09-15 DIAGNOSIS — I69351 Hemiplegia and hemiparesis following cerebral infarction affecting right dominant side: Secondary | ICD-10-CM | POA: Diagnosis not present

## 2020-09-15 DIAGNOSIS — N1831 Chronic kidney disease, stage 3a: Secondary | ICD-10-CM | POA: Diagnosis not present

## 2020-09-15 DIAGNOSIS — G4733 Obstructive sleep apnea (adult) (pediatric): Secondary | ICD-10-CM | POA: Diagnosis not present

## 2020-09-15 DIAGNOSIS — M109 Gout, unspecified: Secondary | ICD-10-CM | POA: Diagnosis not present

## 2020-09-15 DIAGNOSIS — E1122 Type 2 diabetes mellitus with diabetic chronic kidney disease: Secondary | ICD-10-CM | POA: Diagnosis not present

## 2020-09-15 DIAGNOSIS — I69322 Dysarthria following cerebral infarction: Secondary | ICD-10-CM | POA: Diagnosis not present

## 2020-09-15 DIAGNOSIS — I69320 Aphasia following cerebral infarction: Secondary | ICD-10-CM | POA: Diagnosis not present

## 2020-09-15 DIAGNOSIS — Z794 Long term (current) use of insulin: Secondary | ICD-10-CM | POA: Diagnosis not present

## 2020-09-15 DIAGNOSIS — E782 Mixed hyperlipidemia: Secondary | ICD-10-CM | POA: Diagnosis not present

## 2020-09-15 DIAGNOSIS — I129 Hypertensive chronic kidney disease with stage 1 through stage 4 chronic kidney disease, or unspecified chronic kidney disease: Secondary | ICD-10-CM | POA: Diagnosis not present

## 2020-09-15 DIAGNOSIS — E1151 Type 2 diabetes mellitus with diabetic peripheral angiopathy without gangrene: Secondary | ICD-10-CM | POA: Diagnosis not present

## 2020-09-15 DIAGNOSIS — M479 Spondylosis, unspecified: Secondary | ICD-10-CM | POA: Diagnosis not present

## 2020-09-16 DIAGNOSIS — M160 Bilateral primary osteoarthritis of hip: Secondary | ICD-10-CM | POA: Diagnosis not present

## 2020-09-16 DIAGNOSIS — M109 Gout, unspecified: Secondary | ICD-10-CM | POA: Diagnosis not present

## 2020-09-16 DIAGNOSIS — N1831 Chronic kidney disease, stage 3a: Secondary | ICD-10-CM | POA: Diagnosis not present

## 2020-09-16 DIAGNOSIS — I129 Hypertensive chronic kidney disease with stage 1 through stage 4 chronic kidney disease, or unspecified chronic kidney disease: Secondary | ICD-10-CM | POA: Diagnosis not present

## 2020-09-16 DIAGNOSIS — E1122 Type 2 diabetes mellitus with diabetic chronic kidney disease: Secondary | ICD-10-CM | POA: Diagnosis not present

## 2020-09-16 DIAGNOSIS — Z794 Long term (current) use of insulin: Secondary | ICD-10-CM | POA: Diagnosis not present

## 2020-09-16 DIAGNOSIS — I779 Disorder of arteries and arterioles, unspecified: Secondary | ICD-10-CM | POA: Diagnosis not present

## 2020-09-16 DIAGNOSIS — I872 Venous insufficiency (chronic) (peripheral): Secondary | ICD-10-CM | POA: Diagnosis not present

## 2020-09-16 DIAGNOSIS — E782 Mixed hyperlipidemia: Secondary | ICD-10-CM | POA: Diagnosis not present

## 2020-09-16 DIAGNOSIS — I69322 Dysarthria following cerebral infarction: Secondary | ICD-10-CM | POA: Diagnosis not present

## 2020-09-16 DIAGNOSIS — Z9181 History of falling: Secondary | ICD-10-CM | POA: Diagnosis not present

## 2020-09-16 DIAGNOSIS — I69351 Hemiplegia and hemiparesis following cerebral infarction affecting right dominant side: Secondary | ICD-10-CM | POA: Diagnosis not present

## 2020-09-16 DIAGNOSIS — E1151 Type 2 diabetes mellitus with diabetic peripheral angiopathy without gangrene: Secondary | ICD-10-CM | POA: Diagnosis not present

## 2020-09-16 DIAGNOSIS — Z993 Dependence on wheelchair: Secondary | ICD-10-CM | POA: Diagnosis not present

## 2020-09-16 DIAGNOSIS — G4733 Obstructive sleep apnea (adult) (pediatric): Secondary | ICD-10-CM | POA: Diagnosis not present

## 2020-09-16 DIAGNOSIS — M479 Spondylosis, unspecified: Secondary | ICD-10-CM | POA: Diagnosis not present

## 2020-09-16 DIAGNOSIS — B372 Candidiasis of skin and nail: Secondary | ICD-10-CM | POA: Diagnosis not present

## 2020-09-16 DIAGNOSIS — M25561 Pain in right knee: Secondary | ICD-10-CM | POA: Diagnosis not present

## 2020-09-16 DIAGNOSIS — I69320 Aphasia following cerebral infarction: Secondary | ICD-10-CM | POA: Diagnosis not present

## 2020-09-21 DIAGNOSIS — I129 Hypertensive chronic kidney disease with stage 1 through stage 4 chronic kidney disease, or unspecified chronic kidney disease: Secondary | ICD-10-CM | POA: Diagnosis not present

## 2020-09-21 DIAGNOSIS — M160 Bilateral primary osteoarthritis of hip: Secondary | ICD-10-CM | POA: Diagnosis not present

## 2020-09-21 DIAGNOSIS — G4733 Obstructive sleep apnea (adult) (pediatric): Secondary | ICD-10-CM | POA: Diagnosis not present

## 2020-09-21 DIAGNOSIS — M479 Spondylosis, unspecified: Secondary | ICD-10-CM | POA: Diagnosis not present

## 2020-09-21 DIAGNOSIS — E1122 Type 2 diabetes mellitus with diabetic chronic kidney disease: Secondary | ICD-10-CM | POA: Diagnosis not present

## 2020-09-21 DIAGNOSIS — Z794 Long term (current) use of insulin: Secondary | ICD-10-CM | POA: Diagnosis not present

## 2020-09-21 DIAGNOSIS — N1831 Chronic kidney disease, stage 3a: Secondary | ICD-10-CM | POA: Diagnosis not present

## 2020-09-21 DIAGNOSIS — I69322 Dysarthria following cerebral infarction: Secondary | ICD-10-CM | POA: Diagnosis not present

## 2020-09-21 DIAGNOSIS — E782 Mixed hyperlipidemia: Secondary | ICD-10-CM | POA: Diagnosis not present

## 2020-09-21 DIAGNOSIS — I779 Disorder of arteries and arterioles, unspecified: Secondary | ICD-10-CM | POA: Diagnosis not present

## 2020-09-21 DIAGNOSIS — Z993 Dependence on wheelchair: Secondary | ICD-10-CM | POA: Diagnosis not present

## 2020-09-21 DIAGNOSIS — I69351 Hemiplegia and hemiparesis following cerebral infarction affecting right dominant side: Secondary | ICD-10-CM | POA: Diagnosis not present

## 2020-09-21 DIAGNOSIS — I872 Venous insufficiency (chronic) (peripheral): Secondary | ICD-10-CM | POA: Diagnosis not present

## 2020-09-21 DIAGNOSIS — E1151 Type 2 diabetes mellitus with diabetic peripheral angiopathy without gangrene: Secondary | ICD-10-CM | POA: Diagnosis not present

## 2020-09-21 DIAGNOSIS — B372 Candidiasis of skin and nail: Secondary | ICD-10-CM | POA: Diagnosis not present

## 2020-09-21 DIAGNOSIS — M25561 Pain in right knee: Secondary | ICD-10-CM | POA: Diagnosis not present

## 2020-09-21 DIAGNOSIS — I69320 Aphasia following cerebral infarction: Secondary | ICD-10-CM | POA: Diagnosis not present

## 2020-09-21 DIAGNOSIS — M109 Gout, unspecified: Secondary | ICD-10-CM | POA: Diagnosis not present

## 2020-09-21 DIAGNOSIS — Z9181 History of falling: Secondary | ICD-10-CM | POA: Diagnosis not present

## 2020-09-28 DIAGNOSIS — E782 Mixed hyperlipidemia: Secondary | ICD-10-CM | POA: Diagnosis not present

## 2020-09-28 DIAGNOSIS — B372 Candidiasis of skin and nail: Secondary | ICD-10-CM | POA: Diagnosis not present

## 2020-09-28 DIAGNOSIS — E1151 Type 2 diabetes mellitus with diabetic peripheral angiopathy without gangrene: Secondary | ICD-10-CM | POA: Diagnosis not present

## 2020-09-28 DIAGNOSIS — I69322 Dysarthria following cerebral infarction: Secondary | ICD-10-CM | POA: Diagnosis not present

## 2020-09-28 DIAGNOSIS — I69320 Aphasia following cerebral infarction: Secondary | ICD-10-CM | POA: Diagnosis not present

## 2020-09-28 DIAGNOSIS — M160 Bilateral primary osteoarthritis of hip: Secondary | ICD-10-CM | POA: Diagnosis not present

## 2020-09-28 DIAGNOSIS — I872 Venous insufficiency (chronic) (peripheral): Secondary | ICD-10-CM | POA: Diagnosis not present

## 2020-09-28 DIAGNOSIS — N1831 Chronic kidney disease, stage 3a: Secondary | ICD-10-CM | POA: Diagnosis not present

## 2020-09-28 DIAGNOSIS — I779 Disorder of arteries and arterioles, unspecified: Secondary | ICD-10-CM | POA: Diagnosis not present

## 2020-09-28 DIAGNOSIS — I129 Hypertensive chronic kidney disease with stage 1 through stage 4 chronic kidney disease, or unspecified chronic kidney disease: Secondary | ICD-10-CM | POA: Diagnosis not present

## 2020-09-28 DIAGNOSIS — Z993 Dependence on wheelchair: Secondary | ICD-10-CM | POA: Diagnosis not present

## 2020-09-28 DIAGNOSIS — Z794 Long term (current) use of insulin: Secondary | ICD-10-CM | POA: Diagnosis not present

## 2020-09-28 DIAGNOSIS — M479 Spondylosis, unspecified: Secondary | ICD-10-CM | POA: Diagnosis not present

## 2020-09-28 DIAGNOSIS — E1122 Type 2 diabetes mellitus with diabetic chronic kidney disease: Secondary | ICD-10-CM | POA: Diagnosis not present

## 2020-09-28 DIAGNOSIS — G4733 Obstructive sleep apnea (adult) (pediatric): Secondary | ICD-10-CM | POA: Diagnosis not present

## 2020-09-28 DIAGNOSIS — I69351 Hemiplegia and hemiparesis following cerebral infarction affecting right dominant side: Secondary | ICD-10-CM | POA: Diagnosis not present

## 2020-09-28 DIAGNOSIS — Z9181 History of falling: Secondary | ICD-10-CM | POA: Diagnosis not present

## 2020-09-28 DIAGNOSIS — M109 Gout, unspecified: Secondary | ICD-10-CM | POA: Diagnosis not present

## 2020-09-28 DIAGNOSIS — M25561 Pain in right knee: Secondary | ICD-10-CM | POA: Diagnosis not present

## 2020-10-05 DIAGNOSIS — E1151 Type 2 diabetes mellitus with diabetic peripheral angiopathy without gangrene: Secondary | ICD-10-CM | POA: Diagnosis not present

## 2020-10-05 DIAGNOSIS — M25561 Pain in right knee: Secondary | ICD-10-CM | POA: Diagnosis not present

## 2020-10-05 DIAGNOSIS — N1831 Chronic kidney disease, stage 3a: Secondary | ICD-10-CM | POA: Diagnosis not present

## 2020-10-05 DIAGNOSIS — I129 Hypertensive chronic kidney disease with stage 1 through stage 4 chronic kidney disease, or unspecified chronic kidney disease: Secondary | ICD-10-CM | POA: Diagnosis not present

## 2020-10-05 DIAGNOSIS — I69322 Dysarthria following cerebral infarction: Secondary | ICD-10-CM | POA: Diagnosis not present

## 2020-10-05 DIAGNOSIS — I69351 Hemiplegia and hemiparesis following cerebral infarction affecting right dominant side: Secondary | ICD-10-CM | POA: Diagnosis not present

## 2020-10-05 DIAGNOSIS — M479 Spondylosis, unspecified: Secondary | ICD-10-CM | POA: Diagnosis not present

## 2020-10-05 DIAGNOSIS — Z9181 History of falling: Secondary | ICD-10-CM | POA: Diagnosis not present

## 2020-10-05 DIAGNOSIS — G4733 Obstructive sleep apnea (adult) (pediatric): Secondary | ICD-10-CM | POA: Diagnosis not present

## 2020-10-05 DIAGNOSIS — Z794 Long term (current) use of insulin: Secondary | ICD-10-CM | POA: Diagnosis not present

## 2020-10-05 DIAGNOSIS — E782 Mixed hyperlipidemia: Secondary | ICD-10-CM | POA: Diagnosis not present

## 2020-10-05 DIAGNOSIS — Z993 Dependence on wheelchair: Secondary | ICD-10-CM | POA: Diagnosis not present

## 2020-10-05 DIAGNOSIS — I872 Venous insufficiency (chronic) (peripheral): Secondary | ICD-10-CM | POA: Diagnosis not present

## 2020-10-05 DIAGNOSIS — E1122 Type 2 diabetes mellitus with diabetic chronic kidney disease: Secondary | ICD-10-CM | POA: Diagnosis not present

## 2020-10-05 DIAGNOSIS — I69320 Aphasia following cerebral infarction: Secondary | ICD-10-CM | POA: Diagnosis not present

## 2020-10-05 DIAGNOSIS — I779 Disorder of arteries and arterioles, unspecified: Secondary | ICD-10-CM | POA: Diagnosis not present

## 2020-10-05 DIAGNOSIS — B372 Candidiasis of skin and nail: Secondary | ICD-10-CM | POA: Diagnosis not present

## 2020-10-05 DIAGNOSIS — M109 Gout, unspecified: Secondary | ICD-10-CM | POA: Diagnosis not present

## 2020-10-05 DIAGNOSIS — M160 Bilateral primary osteoarthritis of hip: Secondary | ICD-10-CM | POA: Diagnosis not present

## 2020-10-07 ENCOUNTER — Telehealth: Payer: Self-pay | Admitting: Pharmacist

## 2020-10-07 NOTE — Progress Notes (Addendum)
Chronic Care Management Pharmacy Assistant   Name: Eduardo Young  MRN: [id number] DOB: May 17, 1956  Reason for Encounter: Disease State For DM.   Conditions to be addressed/monitored: HTN, TIA hx, Type II DM, CKD, HLD.  Recent office visits:  None since 09/13/20  Recent consult visits:  None since 09/13/20  Hospital visits:  None since 09/13/20  Medications: Outpatient Encounter Medications as of 10/07/2020  Medication Sig   ACCU-CHEK FASTCLIX LANCETS MISC Check BS BID DX - E11.9   allopurinol (ZYLOPRIM) 100 MG tablet Take 1 tablet by mouth twice daily   amLODipine (NORVASC) 10 MG tablet Take 1 tablet (10 mg total) by mouth daily.   aspirin 81 MG tablet Take 81 mg by mouth every evening.    atorvastatin (LIPITOR) 40 MG tablet Take 1 tablet by mouth once daily   baclofen (LIORESAL) 10 MG tablet Take 0.5-1 tablets (5-10 mg total) by mouth 3 (three) times daily as needed for muscle spasms.   bisacodyl (BISACODYL) 5 MG EC tablet Take 1 tablet (5 mg total) by mouth daily as needed for moderate constipation.   Blood Glucose Monitoring Suppl (ACCU-CHEK AVIVA PLUS) w/Device KIT Check BS BID DX - E11.9   cloNIDine (CATAPRES) 0.2 MG tablet Take 1 tablet (0.2 mg total) by mouth 2 (two) times daily.   doxazosin (CARDURA) 4 MG tablet Take 1 tablet by mouth once daily   Dulaglutide (TRULICITY) 2.91 BT/6.6MA SOPN Inject 0.75 mg into the skin once a week.   glucose blood (ACCU-CHEK AVIVA) test strip Check BS BID DX - E11.9   hydrALAZINE (APRESOLINE) 25 MG tablet TAKE 2 TABLETS BY MOUTH THREE TIMES DAILY   hydrochlorothiazide (HYDRODIURIL) 25 MG tablet Take 1 tablet (25 mg total) by mouth daily.   Insulin Glargine (BASAGLAR KWIKPEN) 100 UNIT/ML Inject 56 Units into the skin daily.   insulin lispro (HUMALOG) 100 UNIT/ML KwikPen INJECT 7 UNITS SUBCUTANEOUSLY ONCE DAILY (Patient not taking: Reported on 08/12/2020)   labetalol (NORMODYNE) 300 MG tablet Take 1 tablet by mouth twice daily   Lancets  Misc. (ACCU-CHEK FASTCLIX LANCET) KIT Check BS BID DX - E11.9   lisinopril (ZESTRIL) 20 MG tablet Take 1 tablet by mouth twice daily   Multiple Vitamin (MULTIVITAMIN WITH MINERALS) TABS tablet Take 1 tablet by mouth daily.   polyethylene glycol powder (GLYCOLAX/MIRALAX) powder Take 17 g by mouth daily.   potassium chloride SA (KLOR-CON) 20 MEQ tablet Take 1 tablet (20 mEq total) by mouth daily.   traMADol (ULTRAM) 50 MG tablet TAKE 1 TABLET BY MOUTH EVERY 6 HOURS AS NEEDED   No facility-administered encounter medications on file as of 10/07/2020.   Recent Relevant Labs: Lab Results  Component Value Date/Time   HGBA1C >14.0 (H) 07/13/2020 02:29 PM   HGBA1C 7.3 (H) 09/29/2019 04:03 PM   MICROALBUR 39.1 06/29/2015 10:43 AM   MICROALBUR 59.15 (H) 03/13/2014 11:24 AM    Kidney Function Lab Results  Component Value Date/Time   CREATININE 1.94 (H) 08/12/2020 04:49 PM   CREATININE 1.57 (H) 07/16/2020 03:33 AM   CREATININE 1.53 (H) 07/15/2020 03:31 AM   CREATININE 1.65 (H) 07/13/2020 02:29 PM   GFRNONAA 36 (L) 08/12/2020 04:49 PM   GFRAA 42 (L) 08/12/2020 04:49 PM    Current antihyperglycemic regimen:  Basaglar 100u/ml kwikpen 65 units daily Trulicity 0.04 HT/9.7FS once a week  What recent interventions/DTPs have been made to improve glycemic control:  None.  Have there been any recent hospitalizations or ED visits since last visit with  CPP?  Patients sister stated no.  Patient sister denies hypoglycemic symptoms, including None   Patient sister reports hyperglycemic symptoms, including weakness   How often are you checking your blood sugar? Patient sister stated twice daily   What are your blood sugars ranging?  Patients sister stated: Monday 10/04/20 92 Fasting 152 Bedtime Tuesday 10/05/20 97 Fasting 146 Bedtme Wednesday 10/06/20 93 Fasting 168 Bedtime Thursday 10/07/20 134 Fasting   During the week, how often does your blood glucose drop below 70?  Patients sister stated  Never   Are you checking your feet daily/regularly?  Patients sister stated he does check regularly and haven't said anything to her about his feet.  Adherence Review: Is the patient currently on a STATIN medication? Atorvastatin 40 mg  Is the patient currently on ACE/ARB medication? Lisinopril 20 mg daily  Does the patient have >5 day gap between last estimated fill dates? No.  Patients sister stated she doesn't have any questions or concerns about his medication at this time.  Star Rating Drugs: Atorvastatin 40 mg 1 daily, Lisinopril 20 mg daily.   Follow-Up: Pharmacist Review  Charlann Lange, RMA Clinical Pharmacist Assistant [phone]  10 minutes spent in review, coordination, and documentation.  Reviewed by: Beverly Milch, PharmD Clinical Pharmacist [facility] [phone]

## 2020-10-12 DIAGNOSIS — M160 Bilateral primary osteoarthritis of hip: Secondary | ICD-10-CM | POA: Diagnosis not present

## 2020-10-12 DIAGNOSIS — I129 Hypertensive chronic kidney disease with stage 1 through stage 4 chronic kidney disease, or unspecified chronic kidney disease: Secondary | ICD-10-CM | POA: Diagnosis not present

## 2020-10-12 DIAGNOSIS — N1831 Chronic kidney disease, stage 3a: Secondary | ICD-10-CM | POA: Diagnosis not present

## 2020-10-12 DIAGNOSIS — I69320 Aphasia following cerebral infarction: Secondary | ICD-10-CM | POA: Diagnosis not present

## 2020-10-12 DIAGNOSIS — Z79899 Other long term (current) drug therapy: Secondary | ICD-10-CM | POA: Diagnosis not present

## 2020-10-12 DIAGNOSIS — M479 Spondylosis, unspecified: Secondary | ICD-10-CM | POA: Diagnosis not present

## 2020-10-12 DIAGNOSIS — I872 Venous insufficiency (chronic) (peripheral): Secondary | ICD-10-CM | POA: Diagnosis not present

## 2020-10-12 DIAGNOSIS — I69322 Dysarthria following cerebral infarction: Secondary | ICD-10-CM | POA: Diagnosis not present

## 2020-10-12 DIAGNOSIS — E1122 Type 2 diabetes mellitus with diabetic chronic kidney disease: Secondary | ICD-10-CM | POA: Diagnosis not present

## 2020-10-12 DIAGNOSIS — Z794 Long term (current) use of insulin: Secondary | ICD-10-CM | POA: Diagnosis not present

## 2020-10-12 DIAGNOSIS — Z993 Dependence on wheelchair: Secondary | ICD-10-CM | POA: Diagnosis not present

## 2020-10-12 DIAGNOSIS — I779 Disorder of arteries and arterioles, unspecified: Secondary | ICD-10-CM | POA: Diagnosis not present

## 2020-10-12 DIAGNOSIS — Z9181 History of falling: Secondary | ICD-10-CM | POA: Diagnosis not present

## 2020-10-12 DIAGNOSIS — E1151 Type 2 diabetes mellitus with diabetic peripheral angiopathy without gangrene: Secondary | ICD-10-CM | POA: Diagnosis not present

## 2020-10-12 DIAGNOSIS — M109 Gout, unspecified: Secondary | ICD-10-CM | POA: Diagnosis not present

## 2020-10-12 DIAGNOSIS — E782 Mixed hyperlipidemia: Secondary | ICD-10-CM | POA: Diagnosis not present

## 2020-10-12 DIAGNOSIS — G4733 Obstructive sleep apnea (adult) (pediatric): Secondary | ICD-10-CM | POA: Diagnosis not present

## 2020-10-12 DIAGNOSIS — I69351 Hemiplegia and hemiparesis following cerebral infarction affecting right dominant side: Secondary | ICD-10-CM | POA: Diagnosis not present

## 2020-10-13 ENCOUNTER — Other Ambulatory Visit: Payer: Self-pay | Admitting: Family Medicine

## 2020-10-13 DIAGNOSIS — I10 Essential (primary) hypertension: Secondary | ICD-10-CM

## 2020-10-14 DIAGNOSIS — I739 Peripheral vascular disease, unspecified: Secondary | ICD-10-CM | POA: Diagnosis not present

## 2020-10-14 DIAGNOSIS — L602 Onychogryphosis: Secondary | ICD-10-CM | POA: Diagnosis not present

## 2020-10-19 DIAGNOSIS — E1122 Type 2 diabetes mellitus with diabetic chronic kidney disease: Secondary | ICD-10-CM | POA: Diagnosis not present

## 2020-10-19 DIAGNOSIS — M109 Gout, unspecified: Secondary | ICD-10-CM | POA: Diagnosis not present

## 2020-10-19 DIAGNOSIS — I129 Hypertensive chronic kidney disease with stage 1 through stage 4 chronic kidney disease, or unspecified chronic kidney disease: Secondary | ICD-10-CM | POA: Diagnosis not present

## 2020-10-19 DIAGNOSIS — I69322 Dysarthria following cerebral infarction: Secondary | ICD-10-CM | POA: Diagnosis not present

## 2020-10-19 DIAGNOSIS — G4733 Obstructive sleep apnea (adult) (pediatric): Secondary | ICD-10-CM | POA: Diagnosis not present

## 2020-10-19 DIAGNOSIS — N1831 Chronic kidney disease, stage 3a: Secondary | ICD-10-CM | POA: Diagnosis not present

## 2020-10-19 DIAGNOSIS — E782 Mixed hyperlipidemia: Secondary | ICD-10-CM | POA: Diagnosis not present

## 2020-10-19 DIAGNOSIS — Z993 Dependence on wheelchair: Secondary | ICD-10-CM | POA: Diagnosis not present

## 2020-10-19 DIAGNOSIS — I69320 Aphasia following cerebral infarction: Secondary | ICD-10-CM | POA: Diagnosis not present

## 2020-10-19 DIAGNOSIS — Z794 Long term (current) use of insulin: Secondary | ICD-10-CM | POA: Diagnosis not present

## 2020-10-19 DIAGNOSIS — I779 Disorder of arteries and arterioles, unspecified: Secondary | ICD-10-CM | POA: Diagnosis not present

## 2020-10-19 DIAGNOSIS — I872 Venous insufficiency (chronic) (peripheral): Secondary | ICD-10-CM | POA: Diagnosis not present

## 2020-10-19 DIAGNOSIS — Z9181 History of falling: Secondary | ICD-10-CM | POA: Diagnosis not present

## 2020-10-19 DIAGNOSIS — Z79899 Other long term (current) drug therapy: Secondary | ICD-10-CM | POA: Diagnosis not present

## 2020-10-19 DIAGNOSIS — E1151 Type 2 diabetes mellitus with diabetic peripheral angiopathy without gangrene: Secondary | ICD-10-CM | POA: Diagnosis not present

## 2020-10-19 DIAGNOSIS — M479 Spondylosis, unspecified: Secondary | ICD-10-CM | POA: Diagnosis not present

## 2020-10-19 DIAGNOSIS — M160 Bilateral primary osteoarthritis of hip: Secondary | ICD-10-CM | POA: Diagnosis not present

## 2020-10-19 DIAGNOSIS — I69351 Hemiplegia and hemiparesis following cerebral infarction affecting right dominant side: Secondary | ICD-10-CM | POA: Diagnosis not present

## 2020-10-24 ENCOUNTER — Emergency Department (HOSPITAL_COMMUNITY): Payer: Medicare Other

## 2020-10-24 ENCOUNTER — Other Ambulatory Visit: Payer: Self-pay

## 2020-10-24 ENCOUNTER — Emergency Department (HOSPITAL_COMMUNITY)
Admission: EM | Admit: 2020-10-24 | Discharge: 2020-10-25 | Disposition: A | Discharge status: Home / Self Care | Payer: Medicare Other | Attending: Emergency Medicine | Admitting: Emergency Medicine

## 2020-10-24 ENCOUNTER — Encounter (HOSPITAL_COMMUNITY): Payer: Self-pay | Admitting: Emergency Medicine

## 2020-10-24 DIAGNOSIS — R109 Unspecified abdominal pain: Secondary | ICD-10-CM | POA: Diagnosis not present

## 2020-10-24 DIAGNOSIS — K219 Gastro-esophageal reflux disease without esophagitis: Secondary | ICD-10-CM | POA: Diagnosis not present

## 2020-10-24 DIAGNOSIS — Z743 Need for continuous supervision: Secondary | ICD-10-CM | POA: Diagnosis not present

## 2020-10-24 DIAGNOSIS — Z79899 Other long term (current) drug therapy: Secondary | ICD-10-CM | POA: Insufficient documentation

## 2020-10-24 DIAGNOSIS — K5900 Constipation, unspecified: Secondary | ICD-10-CM | POA: Diagnosis not present

## 2020-10-24 DIAGNOSIS — K529 Noninfective gastroenteritis and colitis, unspecified: Secondary | ICD-10-CM

## 2020-10-24 DIAGNOSIS — Z7982 Long term (current) use of aspirin: Secondary | ICD-10-CM | POA: Insufficient documentation

## 2020-10-24 DIAGNOSIS — Z87891 Personal history of nicotine dependence: Secondary | ICD-10-CM | POA: Diagnosis not present

## 2020-10-24 DIAGNOSIS — E1122 Type 2 diabetes mellitus with diabetic chronic kidney disease: Secondary | ICD-10-CM | POA: Diagnosis not present

## 2020-10-24 DIAGNOSIS — Z794 Long term (current) use of insulin: Secondary | ICD-10-CM | POA: Insufficient documentation

## 2020-10-24 DIAGNOSIS — N1831 Chronic kidney disease, stage 3a: Secondary | ICD-10-CM | POA: Insufficient documentation

## 2020-10-24 DIAGNOSIS — R1084 Generalized abdominal pain: Secondary | ICD-10-CM | POA: Diagnosis present

## 2020-10-24 DIAGNOSIS — I129 Hypertensive chronic kidney disease with stage 1 through stage 4 chronic kidney disease, or unspecified chronic kidney disease: Secondary | ICD-10-CM | POA: Insufficient documentation

## 2020-10-24 DIAGNOSIS — R1030 Lower abdominal pain, unspecified: Secondary | ICD-10-CM | POA: Diagnosis not present

## 2020-10-24 LAB — CBC WITH DIFFERENTIAL/PLATELET
Abs Immature Granulocytes: 0.09 10*3/uL — ABNORMAL HIGH (ref 0.00–0.07)
Basophils Absolute: 0.1 10*3/uL (ref 0.0–0.1)
Basophils Relative: 0 %
Eosinophils Absolute: 0 10*3/uL (ref 0.0–0.5)
Eosinophils Relative: 0 %
HCT: 41 % (ref 39.0–52.0)
Hemoglobin: 13 g/dL (ref 13.0–17.0)
Immature Granulocytes: 0 %
Lymphocytes Relative: 3 %
Lymphs Abs: 0.5 10*3/uL — ABNORMAL LOW (ref 0.7–4.0)
MCH: 28.8 pg (ref 26.0–34.0)
MCHC: 31.7 g/dL (ref 30.0–36.0)
MCV: 90.7 fL (ref 80.0–100.0)
Monocytes Absolute: 0.8 10*3/uL (ref 0.1–1.0)
Monocytes Relative: 4 %
Neutro Abs: 19.5 10*3/uL — ABNORMAL HIGH (ref 1.7–7.7)
Neutrophils Relative %: 93 %
Platelets: 156 10*3/uL (ref 150–400)
RBC: 4.52 MIL/uL (ref 4.22–5.81)
RDW: 15.2 % (ref 11.5–15.5)
WBC: 21 10*3/uL — ABNORMAL HIGH (ref 4.0–10.5)
nRBC: 0.2 % (ref 0.0–0.2)

## 2020-10-24 LAB — COMPREHENSIVE METABOLIC PANEL
ALT: 16 U/L (ref 0–44)
AST: 47 U/L — ABNORMAL HIGH (ref 15–41)
Albumin: 3.8 g/dL (ref 3.5–5.0)
Alkaline Phosphatase: 91 U/L (ref 38–126)
Anion gap: 13 (ref 5–15)
BUN: 27 mg/dL — ABNORMAL HIGH (ref 8–23)
CO2: 21 mmol/L — ABNORMAL LOW (ref 22–32)
Calcium: 8.8 mg/dL — ABNORMAL LOW (ref 8.9–10.3)
Chloride: 105 mmol/L (ref 98–111)
Creatinine, Ser: 1.79 mg/dL — ABNORMAL HIGH (ref 0.61–1.24)
GFR, Estimated: 42 mL/min — ABNORMAL LOW (ref >60)
Glucose, Bld: 201 mg/dL — ABNORMAL HIGH (ref 70–99)
Potassium: 5 mmol/L (ref 3.5–5.1)
Sodium: 139 mmol/L (ref 135–145)
Total Bilirubin: 2.1 mg/dL — ABNORMAL HIGH (ref 0.3–1.2)
Total Protein: 6.9 g/dL (ref 6.5–8.1)

## 2020-10-24 LAB — URINALYSIS, ROUTINE W REFLEX MICROSCOPIC
Bacteria, UA: NONE SEEN
Glucose, UA: NEGATIVE mg/dL
Hgb urine dipstick: NEGATIVE
Ketones, ur: 5 mg/dL — AB
Nitrite: NEGATIVE
Protein, ur: 30 mg/dL — AB
Specific Gravity, Urine: 1.013 (ref 1.005–1.030)
pH: 6 (ref 5.0–8.0)

## 2020-10-24 LAB — LIPASE, BLOOD: Lipase: 31 U/L (ref 11–51)

## 2020-10-24 MED ORDER — IOHEXOL 300 MG/ML  SOLN
80.0000 mL | Freq: Once | INTRAMUSCULAR | Status: AC | PRN
  Administered 2020-10-24: 80 mL via INTRAVENOUS

## 2020-10-24 MED ORDER — SODIUM CHLORIDE 0.9 % IV BOLUS
1000.0000 mL | Freq: Once | INTRAVENOUS | Status: AC
  Administered 2020-10-24: 1000 mL via INTRAVENOUS

## 2020-10-24 MED ORDER — HYDROMORPHONE HCL 1 MG/ML IJ SOLN
1.0000 mg | Freq: Once | INTRAMUSCULAR | Status: AC
  Administered 2020-10-24: 1 mg via INTRAVENOUS
  Filled 2020-10-24: qty 1

## 2020-10-24 NOTE — ED Notes (Signed)
Patient transported to CT 

## 2020-10-24 NOTE — ED Triage Notes (Signed)
BIB EMS from home, C/C constipation x2 weeks. Sister gave a stool softener and laxative one hour ago, no results so sent to ED. Pt is wheelchair bound.

## 2020-10-24 NOTE — ED Provider Notes (Signed)
[facility] DEPT Provider Note   CSN: [id number] Arrival date & time: 10/24/20  1855     History No chief complaint on file.   Eduardo Young is a 65 y.o. male.  Presents to ER with concern for abdominal pain and constipation.  Constipation for approximately 2 weeks.  Tried taking a stool softener and laxative but had no results.  States he is also been having generalized abdominal pain over the past week or so.  Some nausea but no vomiting.  Not passing any gas today.  No fever  HPI     Past Medical History:  Diagnosis Date  . Chronic venous insufficiency   . Diabetes mellitus   . GERD (gastroesophageal reflux disease)   . Gout   . Hemiparesis ([place])   . Hyperlipidemia   . Hypertension   . Obesity   . Renal calculi   . Sleep apnea, obstructive   . Stroke [facility])    [id number]    Patient Active Problem List   Diagnosis Date Noted  . Mixed diabetic hyperlipidemia associated with type 2 diabetes mellitus ([place]) 07/14/2020  . Fall at home, initial encounter 07/14/2020  . [place] intertrigo 07/14/2020  . Right hip pain 07/13/2020  . Pain in right hip 10/14/2019  . Rectal bleeding   . Segmental colitis with complication ([place])   . Adjustment disorder with mixed anxiety and depressed mood   . Benign essential HTN   . Type 2 diabetes mellitus with stage 3a chronic kidney disease, with long-term current use of insulin ([place])   . Chronic kidney disease, stage 3a ([place])   . Spastic hemiparesis of right dominant side ([place]) 09/08/2016  . Weakness   . Myoclonus   . Seizures ([place])   . TIA (transient ischemic attack) 09/07/2016  . Chronic venous insufficiency   . History of cerebral infarction ([place]) 10/21/2010  . DM (diabetes mellitus) ([place]) 10/21/2010  . HLD (hyperlipidemia) 10/21/2010  . Essential hypertension 10/21/2010  . OSA (obstructive sleep apnea) 10/21/2010    Past Surgical History:  Procedure Laterality Date  . BIOPSY  09/06/2018    Procedure: BIOPSY;  Surgeon: Jerene Bears, MD;  Location: [facility];  Service: Gastroenterology;;  . COLONOSCOPY WITH PROPOFOL N/A 09/06/2018   Procedure: COLONOSCOPY WITH PROPOFOL;  Surgeon: Jerene Bears, MD;  Location: [facility];  Service: Gastroenterology;  Laterality: N/A;  . LITHOTRIPSY    . NASAL POLYP EXCISION         Family History  Problem Relation Age of Onset  . Heart attack Mother   . Hypertension Mother   . Hyperlipidemia Mother   . Hypertension Father   . Hyperlipidemia Father   . CVA Father   . Heart attack Sister   . Lung cancer Brother   . Heart attack Brother   . Ovarian cancer Sister   . Heart attack Sister   . CVA Sister     Social History   Tobacco Use  . Smoking status: Former Research scientist (life sciences)  . Smokeless tobacco: Never Used  Vaping Use  . Vaping Use: Never used  Substance Use Topics  . Alcohol use: Yes    Comment: Occasional  . Drug use: Not Currently    Types: Cocaine, Marijuana    Home Medications Prior to Admission medications   Medication Sig Start Date End Date Taking? Authorizing Provider  ACCU-CHEK FASTCLIX LANCETS MISC Check BS BID DX - E11.9 09/04/18   Susy Frizzle, MD  allopurinol (ZYLOPRIM) 100 MG tablet  Take 1 tablet by mouth twice daily 08/11/20   Susy Frizzle, MD  amLODipine (NORVASC) 10 MG tablet Take 1 tablet (10 mg total) by mouth daily. 09/14/20   Susy Frizzle, MD  aspirin 81 MG tablet Take 81 mg by mouth every evening.     [provider]  atorvastatin (LIPITOR) 40 MG tablet Take 1 tablet by mouth once daily 08/11/20   Susy Frizzle, MD  baclofen (LIORESAL) 10 MG tablet Take 0.5-1 tablets (5-10 mg total) by mouth 3 (three) times daily as needed for muscle spasms. 06/22/20   Hilts, Legrand Como, MD  bisacodyl (BISACODYL) 5 MG EC tablet Take 1 tablet (5 mg total) by mouth daily as needed for moderate constipation. 09/17/18   Susy Frizzle, MD  Blood Glucose Monitoring Suppl (ACCU-CHEK AVIVA PLUS) w/Device KIT  Check BS BID DX - E11.9 09/04/18   Susy Frizzle, MD  cloNIDine (CATAPRES) 0.2 MG tablet Take 1 tablet (0.2 mg total) by mouth 2 (two) times daily. 09/14/20   Susy Frizzle, MD  doxazosin (CARDURA) 4 MG tablet Take 1 tablet by mouth once daily 08/11/20   Susy Frizzle, MD  Dulaglutide (TRULICITY) 9.29 WK/4.6KM SOPN Inject 0.75 mg into the skin once a week. 08/19/20   Susy Frizzle, MD  glucose blood (ACCU-CHEK AVIVA) test strip Check BS BID DX - E11.9 09/04/18   Susy Frizzle, MD  hydrALAZINE (APRESOLINE) 25 MG tablet TAKE 2 TABLETS BY MOUTH THREE TIMES DAILY 10/13/20   Susy Frizzle, MD  hydrochlorothiazide (HYDRODIURIL) 25 MG tablet Take 1 tablet (25 mg total) by mouth daily. 01/07/20   Susy Frizzle, MD  Insulin Glargine (BASAGLAR KWIKPEN) 100 UNIT/ML Inject 56 Units into the skin daily.    [provider]  insulin lispro (HUMALOG) 100 UNIT/ML KwikPen INJECT 7 UNITS SUBCUTANEOUSLY ONCE DAILY Patient not taking: Reported on 08/12/2020 12/10/19   Susy Frizzle, MD  labetalol (NORMODYNE) 300 MG tablet Take 1 tablet by mouth twice daily 08/11/20   Susy Frizzle, MD  Lancets Misc. (ACCU-CHEK FASTCLIX LANCET) KIT Check BS BID DX - E11.9 09/04/18   Susy Frizzle, MD  lisinopril (ZESTRIL) 20 MG tablet Take 1 tablet by mouth twice daily 08/11/20   Susy Frizzle, MD  Multiple Vitamin (MULTIVITAMIN WITH MINERALS) TABS tablet Take 1 tablet by mouth daily.    [provider]  polyethylene glycol powder (GLYCOLAX/MIRALAX) powder Take 17 g by mouth daily. 09/06/18   Pyrtle, Lajuan Lines, MD  potassium chloride SA (KLOR-CON) 20 MEQ tablet Take 1 tablet (20 mEq total) by mouth daily. 08/19/20   Susy Frizzle, MD  traMADol (ULTRAM) 50 MG tablet TAKE 1 TABLET BY MOUTH EVERY 6 HOURS AS NEEDED 09/03/20   Hilts, Legrand Como, MD    Allergies    Patient has no known allergies.  Review of Systems   Review of Systems  Constitutional: Negative for chills and fever.  HENT:  Negative for ear pain and sore throat.   Eyes: Negative for pain and visual disturbance.  Respiratory: Negative for cough and shortness of breath.   Cardiovascular: Negative for chest pain and palpitations.  Gastrointestinal: Positive for abdominal pain, constipation, nausea and vomiting. Negative for diarrhea.  Genitourinary: Negative for dysuria and hematuria.  Musculoskeletal: Negative for arthralgias and back pain.  Skin: Negative for color change and rash.  Neurological: Negative for seizures and syncope.  All other systems reviewed and are negative.   Physical Exam Updated Vital Signs  BP 136/72   Pulse 80   Temp 98.5 F (36.9 C) (Oral)   Resp 18   Ht 6' 4" (1.93 m)   Wt 131 kg   SpO2 98%   BMI 35.15 kg/m   Physical Exam Vitals and nursing note reviewed.  Constitutional:      Appearance: He is well-developed.     Comments: Chronically ill-appearing  HENT:     Head: Normocephalic and atraumatic.  Eyes:     Conjunctiva/sclera: Conjunctivae normal.  Cardiovascular:     Rate and Rhythm: Normal rate and regular rhythm.     Heart sounds: No murmur heard.   Pulmonary:     Effort: Pulmonary effort is normal. No respiratory distress.     Breath sounds: Normal breath sounds.  Abdominal:     Comments: Mildly distended, generalized tenderness, no rebound or guarding  Musculoskeletal:        General: No deformity or signs of injury.     Cervical back: Neck supple.  Skin:    General: Skin is warm and dry.  Neurological:     General: No focal deficit present.     Mental Status: He is alert.  Psychiatric:        Mood and Affect: Mood normal.     ED Results / Procedures / Treatments   Labs (all labs ordered are listed, but only abnormal results are displayed) Labs Reviewed  CBC WITH DIFFERENTIAL/PLATELET - Abnormal; Notable for the following components:      Result Value   WBC 21.0 (*)    Neutro Abs 19.5 (*)    Lymphs Abs 0.5 (*)    Abs Immature Granulocytes 0.09  (*)    All other components within normal limits  COMPREHENSIVE METABOLIC PANEL - Abnormal; Notable for the following components:   CO2 21 (*)    Glucose, Bld 201 (*)    BUN 27 (*)    Creatinine, Ser 1.79 (*)    Calcium 8.8 (*)    AST 47 (*)    Total Bilirubin 2.1 (*)    GFR, Estimated 42 (*)    All other components within normal limits  URINALYSIS, ROUTINE W REFLEX MICROSCOPIC - Abnormal; Notable for the following components:   Color, Urine AMBER (*)    Bilirubin Urine SMALL (*)    Ketones, ur 5 (*)    Protein, ur 30 (*)    Leukocytes,Ua TRACE (*)    All other components within normal limits  LIPASE, BLOOD    EKG None  Radiology No results found.  Procedures Procedures   Medications Ordered in ED Medications  HYDROmorphone (DILAUDID) injection 1 mg (1 mg Intravenous Given 10/24/20 2218)  sodium chloride 0.9 % bolus 1,000 mL (1,000 mLs Intravenous New Bag/Given 10/24/20 2217)  iohexol (OMNIPAQUE) 300 MG/ML solution 80 mL (80 mLs Intravenous Contrast Given 10/24/20 2302)    ED Course  I have reviewed the triage vital signs and the nursing notes.  Pertinent labs & imaging results that were available during my care of the patient were reviewed by me and considered in my medical decision making (see chart for details).    MDM Rules/Calculators/A&P                         65 year old male presents to ER with concern for constipation and abdominal pain.  Will check basic labs and CT scan.  While awaiting CT results and reassessment, signed out to Dr. Tyrone Nine.  Final Clinical Impression(s) /  ED Diagnoses Final diagnoses:  Constipation, unspecified constipation type  Abdominal pain, unspecified abdominal location    Rx / DC Orders ED Discharge Orders    None       Lucrezia Starch, MD 10/24/20 2323

## 2020-10-25 MED ORDER — AMOXICILLIN-POT CLAVULANATE 875-125 MG PO TABS
1.0000 | ORAL_TABLET | Freq: Once | ORAL | Status: AC
  Administered 2020-10-25: 1 via ORAL
  Filled 2020-10-25: qty 1

## 2020-10-25 MED ORDER — AMOXICILLIN-POT CLAVULANATE 875-125 MG PO TABS: 1.0000 | ORAL_TABLET | Freq: Two times a day (BID) | ORAL | 0 refills | Status: DC

## 2020-10-25 NOTE — ED Provider Notes (Signed)
I received the patient in signout from Dr. Roslynn Amble briefly the patient is a 65 year old male with a chief complaints of constipation.  Awaiting CT results.  CT has resulted and shows concern for colitis.  Family is concerned mostly about his constipation.  Discussed different possible ways to treat at home.  Given GI follow-up.  Will start on antibiotics.  Discharge home.     Deno Etienne, DO 10/25/20 [phone]

## 2020-10-25 NOTE — Discharge Instructions (Signed)
Take 8 scoops of miralax in 32oz of whatever you would like to drink.(Gatorade comes in this size) You can also use a fleets enema which you can buy over the counter at the pharmacy.  Return for worsening abdominal pain, vomiting or fever. ? ?

## 2020-10-29 DIAGNOSIS — E782 Mixed hyperlipidemia: Secondary | ICD-10-CM | POA: Diagnosis not present

## 2020-10-29 DIAGNOSIS — E1151 Type 2 diabetes mellitus with diabetic peripheral angiopathy without gangrene: Secondary | ICD-10-CM | POA: Diagnosis not present

## 2020-10-29 DIAGNOSIS — Z9181 History of falling: Secondary | ICD-10-CM | POA: Diagnosis not present

## 2020-10-29 DIAGNOSIS — I69351 Hemiplegia and hemiparesis following cerebral infarction affecting right dominant side: Secondary | ICD-10-CM | POA: Diagnosis not present

## 2020-10-29 DIAGNOSIS — I69320 Aphasia following cerebral infarction: Secondary | ICD-10-CM | POA: Diagnosis not present

## 2020-10-29 DIAGNOSIS — N1831 Chronic kidney disease, stage 3a: Secondary | ICD-10-CM | POA: Diagnosis not present

## 2020-10-29 DIAGNOSIS — M109 Gout, unspecified: Secondary | ICD-10-CM | POA: Diagnosis not present

## 2020-10-29 DIAGNOSIS — I779 Disorder of arteries and arterioles, unspecified: Secondary | ICD-10-CM | POA: Diagnosis not present

## 2020-10-29 DIAGNOSIS — I69322 Dysarthria following cerebral infarction: Secondary | ICD-10-CM | POA: Diagnosis not present

## 2020-10-29 DIAGNOSIS — I872 Venous insufficiency (chronic) (peripheral): Secondary | ICD-10-CM | POA: Diagnosis not present

## 2020-10-29 DIAGNOSIS — E1122 Type 2 diabetes mellitus with diabetic chronic kidney disease: Secondary | ICD-10-CM | POA: Diagnosis not present

## 2020-10-29 DIAGNOSIS — M479 Spondylosis, unspecified: Secondary | ICD-10-CM | POA: Diagnosis not present

## 2020-10-29 DIAGNOSIS — G4733 Obstructive sleep apnea (adult) (pediatric): Secondary | ICD-10-CM | POA: Diagnosis not present

## 2020-10-29 DIAGNOSIS — Z993 Dependence on wheelchair: Secondary | ICD-10-CM | POA: Diagnosis not present

## 2020-10-29 DIAGNOSIS — Z79899 Other long term (current) drug therapy: Secondary | ICD-10-CM | POA: Diagnosis not present

## 2020-10-29 DIAGNOSIS — M160 Bilateral primary osteoarthritis of hip: Secondary | ICD-10-CM | POA: Diagnosis not present

## 2020-10-29 DIAGNOSIS — Z794 Long term (current) use of insulin: Secondary | ICD-10-CM | POA: Diagnosis not present

## 2020-10-29 DIAGNOSIS — I129 Hypertensive chronic kidney disease with stage 1 through stage 4 chronic kidney disease, or unspecified chronic kidney disease: Secondary | ICD-10-CM | POA: Diagnosis not present

## 2020-11-04 DIAGNOSIS — Z9181 History of falling: Secondary | ICD-10-CM | POA: Diagnosis not present

## 2020-11-04 DIAGNOSIS — Z79899 Other long term (current) drug therapy: Secondary | ICD-10-CM | POA: Diagnosis not present

## 2020-11-04 DIAGNOSIS — Z993 Dependence on wheelchair: Secondary | ICD-10-CM | POA: Diagnosis not present

## 2020-11-04 DIAGNOSIS — I129 Hypertensive chronic kidney disease with stage 1 through stage 4 chronic kidney disease, or unspecified chronic kidney disease: Secondary | ICD-10-CM | POA: Diagnosis not present

## 2020-11-04 DIAGNOSIS — M479 Spondylosis, unspecified: Secondary | ICD-10-CM | POA: Diagnosis not present

## 2020-11-04 DIAGNOSIS — I69351 Hemiplegia and hemiparesis following cerebral infarction affecting right dominant side: Secondary | ICD-10-CM | POA: Diagnosis not present

## 2020-11-04 DIAGNOSIS — E1151 Type 2 diabetes mellitus with diabetic peripheral angiopathy without gangrene: Secondary | ICD-10-CM | POA: Diagnosis not present

## 2020-11-04 DIAGNOSIS — I779 Disorder of arteries and arterioles, unspecified: Secondary | ICD-10-CM | POA: Diagnosis not present

## 2020-11-04 DIAGNOSIS — E1122 Type 2 diabetes mellitus with diabetic chronic kidney disease: Secondary | ICD-10-CM | POA: Diagnosis not present

## 2020-11-04 DIAGNOSIS — M160 Bilateral primary osteoarthritis of hip: Secondary | ICD-10-CM | POA: Diagnosis not present

## 2020-11-04 DIAGNOSIS — G4733 Obstructive sleep apnea (adult) (pediatric): Secondary | ICD-10-CM | POA: Diagnosis not present

## 2020-11-04 DIAGNOSIS — Z794 Long term (current) use of insulin: Secondary | ICD-10-CM | POA: Diagnosis not present

## 2020-11-04 DIAGNOSIS — I69320 Aphasia following cerebral infarction: Secondary | ICD-10-CM | POA: Diagnosis not present

## 2020-11-04 DIAGNOSIS — E782 Mixed hyperlipidemia: Secondary | ICD-10-CM | POA: Diagnosis not present

## 2020-11-04 DIAGNOSIS — I872 Venous insufficiency (chronic) (peripheral): Secondary | ICD-10-CM | POA: Diagnosis not present

## 2020-11-04 DIAGNOSIS — I69322 Dysarthria following cerebral infarction: Secondary | ICD-10-CM | POA: Diagnosis not present

## 2020-11-04 DIAGNOSIS — N1831 Chronic kidney disease, stage 3a: Secondary | ICD-10-CM | POA: Diagnosis not present

## 2020-11-04 DIAGNOSIS — M109 Gout, unspecified: Secondary | ICD-10-CM | POA: Diagnosis not present

## 2020-11-08 NOTE — Progress Notes (Signed)
Chronic Care Management Pharmacy Note  11/15/2020 Name:  Eduardo Young MRN:  [id number] DOB:  Mar 29, 1956  Subjective: Eduardo Young is an 65 y.o. year old male who is a primary patient of Pickard, Cammie Mcgee, MD.  The CCM team was consulted for assistance with disease management and care coordination needs.    Patient dropped by the office for follow up in response to provider referral for pharmacy case management and/or care coordination services.   Consent to Services:  The patient was given the following information about Chronic Care Management services today, agreed to services, and gave verbal consent: 1. CCM service includes personalized support from designated clinical staff supervised by the primary care provider, including individualized plan of care and coordination with other care providers 2. 24/7 contact phone numbers for assistance for urgent and routine care needs. 3. Service will only be billed when office clinical staff spend 20 minutes or more in a month to coordinate care. 4. Only one practitioner may furnish and bill the service in a calendar month. 5.The patient may stop CCM services at any time (effective at the end of the month) by phone call to the office staff. 6. The patient will be responsible for cost sharing (co-pay) of up to 20% of the service fee (after annual deductible is met). Patient agreed to services and consent obtained.  Recent office visits: None since last CCM visit  Recent consults: None since last CCM visit  Recent hospital visits: 10/24/20 (ED) - abdominal pain and constipation, CT shows colitis of descending colon, no obstruction  Patient Care Team: Susy Frizzle, MD as PCP - General (Family Medicine) Edythe Clarity, [facility] as Pharmacist (Pharmacist)   Objective:  Lab Results  Component Value Date   CREATININE 1.79 (H) 10/24/2020   BUN 27 (H) 10/24/2020   GFRNONAA 42 (L) 10/24/2020   GFRAA 42 (L) 08/12/2020   NA 139 10/24/2020    K 5.0 10/24/2020   CALCIUM 8.8 (L) 10/24/2020   CO2 21 (L) 10/24/2020    Lab Results  Component Value Date/Time   HGBA1C >14.0 (H) 07/13/2020 02:29 PM   HGBA1C 7.3 (H) 09/29/2019 04:03 PM   MICROALBUR 39.1 06/29/2015 10:43 AM   MICROALBUR 59.15 (H) 03/13/2014 11:24 AM    Last diabetic Eye exam: No results found for: HMDIABEYEEXA  Last diabetic Foot exam: No results found for: HMDIABFOOTEX   Lab Results  Component Value Date   CHOL 133 06/07/2017   HDL 22 (L) 06/07/2017   LDLCALC 80 06/07/2017   TRIG 222 (H) 06/07/2017   CHOLHDL 6.0 (H) 06/07/2017    Hepatic Function Latest Ref Rng & Units 10/24/2020 08/12/2020 07/14/2020  Total Protein 6.5 - 8.1 g/dL 6.9 5.8(L) 6.1(L)  Albumin 3.5 - 5.0 g/dL 3.8 - 3.3(L)  AST 15 - 41 U/L 47(H) 15 15  ALT 0 - 44 U/L _0 Alk Phosphatase 38 - 126 U/L 91 - 100  Total Bilirubin 0.3 - 1.2 mg/dL 2.1(H) 0.7 0.8    Lab Results  Component Value Date/Time   TSH 3.659 06/29/2015 10:18 AM    CBC Latest Ref Rng & Units 10/24/2020 08/12/2020 07/14/2020  WBC 4.0 - 10.5 K/uL 21.0(H) 9.1 8.0  Hemoglobin 13.0 - 17.0 g/dL 13.0 10.7(L) 12.2(L)  Hematocrit 39.0 - 52.0 % 41.0 31.8(L) 37.3(L)  Platelets 150 - 400 K/uL 156 273 175    No results found for: VD25OH    Depression screen [facility] 2/9 09/03/2017 06/07/2017 10/26/2016  Decreased  Interest 0 0 0  Down, Depressed, Hopeless 0 0 0  PHQ - 2 Score 0 0 0  Altered sleeping - - 1  Tired, decreased energy - - 1  Change in appetite - - 0  Feeling bad or failure about yourself  - - 0  Trouble concentrating - - 0  Moving slowly or fidgety/restless - - 0  Suicidal thoughts - - 0  PHQ-9 Score - - 2  Difficult doing work/chores - - Not difficult at all      Social History   Tobacco Use  Smoking Status Former Smoker  Smokeless Tobacco Never Used   BP Readings from Last 3 Encounters:  10/25/20 (!) 155/80  08/12/20 116/68  07/16/20 (!) 159/69   Pulse Readings from Last 3 Encounters:  10/25/20 85   08/12/20 70  07/16/20 (!) 48   Wt Readings from Last 3 Encounters:  10/24/20 288 lb 12.8 oz (131 kg)  07/14/20 288 lb 9.3 oz (130.9 kg)  09/06/18 300 lb (136.1 kg)    Assessment/Interventions: Review of patient past medical history, allergies, medications, health status, including review of consultants reports, laboratory and other test data, was performed as part of comprehensive evaluation and provision of chronic care management services.   SDOH:  (Social Determinants of Health) assessments and interventions performed:   Financial Resource Strain: Medium Risk  . Difficulty of Paying Living Expenses: Somewhat hard     CCM Care Plan  No Known Allergies  Medications Reviewed Today    Reviewed by Edythe Clarity, [facility] (Pharmacist) on 11/15/20 at 1014  Med List Status: <None>  Medication Order Taking? Sig Documenting Provider Last Dose Status Informant  ACCU-CHEK FASTCLIX LANCETS MISC [id number] No Check BS BID DX - E11.9 Susy Frizzle, MD Taking Active Spouse/Significant Other  allopurinol (ZYLOPRIM) 100 MG tablet [id number] No Take 1 tablet by mouth twice daily Susy Frizzle, MD Taking Active   amLODipine (NORVASC) 10 MG tablet [id number]  Take 1 tablet (10 mg total) by mouth daily. Susy Frizzle, MD  Active   amoxicillin-clavulanate (AUGMENTIN) 875-125 MG tablet [id number]  Take 1 tablet by mouth 2 (two) times daily. One po bid x 7 days Deno Etienne, DO  Active   aspirin 81 MG tablet [id number] No Take 81 mg by mouth every evening.  [provider] Taking Active Spouse/Significant Other  atorvastatin (LIPITOR) 40 MG tablet [id number] No Take 1 tablet by mouth once daily Susy Frizzle, MD Taking Active   baclofen (LIORESAL) 10 MG tablet [id number] No Take 0.5-1 tablets (5-10 mg total) by mouth 3 (three) times daily as needed for muscle spasms. Hilts, Michael, MD Taking Active Spouse/Significant Other  bisacodyl (BISACODYL) 5 MG EC tablet [id number] No Take 1 tablet  (5 mg total) by mouth daily as needed for moderate constipation. Susy Frizzle, MD Taking Active   Blood Glucose Monitoring Suppl (ACCU-CHEK AVIVA PLUS) w/Device KIT [id number] No Check BS BID DX - E11.9 Susy Frizzle, MD Taking Active Spouse/Significant Other  cloNIDine (CATAPRES) 0.2 MG tablet [id number]  Take 1 tablet (0.2 mg total) by mouth 2 (two) times daily. Susy Frizzle, MD  Active   doxazosin (CARDURA) 4 MG tablet [id number] No Take 1 tablet by mouth once daily Susy Frizzle, MD Taking Active   Dulaglutide (TRULICITY) 6.22 QJ/3.3LK SOPN [id number]  Inject 0.75 mg into the skin once a week. Susy Frizzle, MD  Active   glucose blood (ACCU-CHEK AVIVA) test strip [id number] No Check BS BID  DX - E11.9 Susy Frizzle, MD Taking Active   hydrALAZINE (APRESOLINE) 25 MG tablet [id number]  TAKE 2 TABLETS BY MOUTH THREE TIMES DAILY Susy Frizzle, MD  Active   hydrochlorothiazide (HYDRODIURIL) 25 MG tablet [id number] No Take 1 tablet (25 mg total) by mouth daily. Susy Frizzle, MD Taking Active Spouse/Significant Other  Insulin Glargine [facility] KWIKPEN) 100 UNIT/ML [id number] No Inject 56 Units into the skin daily. [provider] Taking Active        Patient not taking:      Discontinued 11/15/20 1014 (No longer needed (for PRN medications))   labetalol (NORMODYNE) 300 MG tablet [id number] No Take 1 tablet by mouth twice daily Susy Frizzle, MD Taking Active   Lancets Misc. (ACCU-CHEK FASTCLIX LANCET) KIT [id number] No Check BS BID DX - E11.9 Susy Frizzle, MD Taking Active Spouse/Significant Other  lisinopril (ZESTRIL) 20 MG tablet [id number] No Take 1 tablet by mouth twice daily Susy Frizzle, MD Taking Active   Multiple Vitamin (MULTIVITAMIN WITH MINERALS) TABS tablet [id number] No Take 1 tablet by mouth daily. [provider] Taking Active Spouse/Significant Other  polyethylene glycol powder (GLYCOLAX/MIRALAX) powder [id number] No Take 17 g by mouth  daily. Jerene Bears, MD Taking Active   potassium chloride SA (KLOR-CON) 20 MEQ tablet [id number]  Take 1 tablet (20 mEq total) by mouth daily. Susy Frizzle, MD  Active   traMADol Veatrice [place]) 50 MG tablet [id number]  TAKE 1 TABLET BY MOUTH EVERY 6 HOURS AS NEEDED Hilts, Legrand Como, MD  Active   Med List Note Victoriano Lain, CPhT 07/14/20 0037): Wife helps          Patient Active Problem List   Diagnosis Date Noted  . Mixed diabetic hyperlipidemia associated with type 2 diabetes mellitus ([place]) 07/14/2020  . Fall at home, initial encounter 07/14/2020  . Candidal intertrigo 07/14/2020  . Right hip pain 07/13/2020  . Pain in right hip 10/14/2019  . Rectal bleeding   . Segmental colitis with complication ([place])   . Adjustment disorder with mixed anxiety and depressed mood   . Benign essential HTN   . Type 2 diabetes mellitus with stage 3a chronic kidney disease, with long-term current use of insulin ([place])   . Chronic kidney disease, stage 3a ([place])   . Spastic hemiparesis of right dominant side ([place]) 09/08/2016  . Weakness   . Myoclonus   . Seizures ([place])   . TIA (transient ischemic attack) 09/07/2016  . Chronic venous insufficiency   . History of cerebral infarction ([place]) 10/21/2010  . DM (diabetes mellitus) ([place]) 10/21/2010  . HLD (hyperlipidemia) 10/21/2010  . Essential hypertension 10/21/2010  . OSA (obstructive sleep apnea) 10/21/2010    Immunization History  Administered Date(s) Administered  . Influenza Whole 04/25/2010  . Influenza,inj,Quad PF,6+ Mos 07/08/2015, 08/22/2016, 06/07/2017, 09/17/2018, 04/01/2019  . Moderna Sars-Covid-2 Vaccination 11/12/2019, 12/10/2019  . Pneumococcal Polysaccharide-23 09/17/2018    Conditions to be addressed/monitored:  DM, HTN  Goals Addressed            This Visit's Progress   . Manage My Medicine   On track    Timeframe:  Long-Range Goal Priority:  High Start Date:  09/13/20                           Expected End Date:   03/13/21  Follow Up Date 12/21/20   - call for medicine refill 2 or 3 days before it runs out - keep a list of all the medicines I take; vitamins and herbals too - use a pillbox to sort medicine    Why is this important?   . These steps will help you keep on track with your medicines.   Notes:        Care Plan : General Pharmacy (Adult)  Updates made by Edythe Clarity, RPH since 11/15/2020 12:00 AM    Problem: DM, HTN   Priority: High  Onset Date: 09/13/2020    Long-Range Goal: Patient-Specific Goal   Start Date: 09/13/2020  Expected End Date: 03/13/2021  Recent Progress: On track  Priority: High  Note:   Current Barriers:  . Unable to independently afford treatment regimen  Pharmacist Clinical Goal(s):  Marland Kitchen Over the next 30 days, patient will verbalize ability to afford treatment regimen through collaboration with PharmD and provider.  Interventions: . 1:1 collaboration with Susy Frizzle, MD regarding development and update of comprehensive plan of care as evidenced by provider attestation and co-signature . Inter-disciplinary care team collaboration (see longitudinal plan of care) . Comprehensive medication review performed; medication list updated in electronic medical record  Hypertension (BP goal <130/80) -controlled -Current treatment: . Amlodipine 61m . Clonidine 0.259mdaily . Doxazosin 65m58maily . Hydralazine 46m82mily . Labetalol 300mg55m . Lisinopril 20mg 31mications previously tried: none noted -Denies hypotensive/hypertensive symptoms  11/15/2020 -Current home readings: no logs available, pt states he has home nurse check it and they always report good -Current exercise habits: doing neck exercises at home, not much otherwise -Denies hypotensive/hypertensive symptoms -Educated on BP goals and benefits of medications for prevention of heart attack, stroke and kidney damage; Importance of home blood pressure  monitoring; Proper BP monitoring technique; Symptoms of hypotension and importance of maintaining adequate hydration; -Counseled to monitor BP at home a few times per week, document, and provide log at future appointments -Recommended to continue current medication   Diabetes (A1c goal <7%) -uncontrolled -Current medications: . Basaglar 100u/ml kwikpen 56 units daily . Humalog 100u/ml 7 units once daily - patient is not taking at this time . Trulicity 0.75mg2.58NI/7.7OEcations previously tried: none noted -Educated onPAP program through [employer] [place]/21 -[place] to determine status of application with Trulicity.  Representative notified me that he is approved through 07/23/21.  Will need to renew both Basaglar and Trulicity at that time.   11/15/2020 -Current home glucose readings . fasting glucose: 128, most < 120 . post prandial glucose: 136 - 184, none > 200 -Denies hypoglycemic/hyperglycemic symptoms -Current meal patterns: patient watching carbohydrate intake and sugars -Current exercise: minimal, neck exercises -Reports lowest blood sugar was 88 since last visit, reports a POC A1c test that a home nurse came in to do was a 7.1%. -Has only missed one or two doses of his insulin lately -Educated on A1c and blood sugar goals; Complications of diabetes including kidney damage, retinal damage, and cardiovascular disease; Prevention and management of hypoglycemic episodes; Benefits of routine self-monitoring of blood sugar; -Counseled to check feet daily and get yearly eye exams -Recommended to continue current medication Patient confirmed both insulin and Trulicity are coming to him for free    Patient Goals/Self-Care Activities . Over the next 30 days, patient will:  - take medications as prescribed check glucose daily, document, and provide at future appointments, check blood pressure a few times  per week and document.    Follow Up  Plan: The care management team will reach out to the patient again over the next 120 days.               Medication Assistance: PAP for Basaglar and Trulicity obtained through [employer] medication assistance program.  Enrollment ends 07/23/21.  Patient's preferred pharmacy is:  [facility], [address] [place] [address] Phone: [phone] Fax: [phone]  [facility], [place] to [address] [place] [address] Phone: [phone] Fax: [phone]   Follow Up:  Patient agrees to Care Plan and Follow-up.  Plan: The patient has been provided with contact information for the care management team and has been advised to call with any health related questions or concerns.   Beverly Milch, PharmD Clinical Pharmacist [place] [phone]

## 2020-11-15 ENCOUNTER — Other Ambulatory Visit: Payer: Self-pay | Admitting: Family Medicine

## 2020-11-15 ENCOUNTER — Ambulatory Visit (INDEPENDENT_AMBULATORY_CARE_PROVIDER_SITE_OTHER): Payer: Medicare Other | Admitting: Pharmacist

## 2020-11-15 DIAGNOSIS — Z794 Long term (current) use of insulin: Secondary | ICD-10-CM | POA: Diagnosis not present

## 2020-11-15 DIAGNOSIS — N1831 Chronic kidney disease, stage 3a: Secondary | ICD-10-CM | POA: Diagnosis not present

## 2020-11-15 DIAGNOSIS — E1122 Type 2 diabetes mellitus with diabetic chronic kidney disease: Secondary | ICD-10-CM

## 2020-11-15 DIAGNOSIS — I10 Essential (primary) hypertension: Secondary | ICD-10-CM

## 2020-11-15 NOTE — Patient Instructions (Addendum)
Visit Information  Goals Addressed   None    Patient Care Plan: General Pharmacy (Adult)    Problem Identified: DM, HTN   Priority: High  Onset Date: 09/13/2020    Long-Range Goal: Patient-Specific Goal   Start Date: 09/13/2020  Expected End Date: 03/13/2021  This Visit's Progress: On track  Priority: High  Note:   Current Barriers:  . Unable to independently afford treatment regimen  Pharmacist Clinical Goal(s):  Marland Kitchen Over the next 30 days, patient will verbalize ability to afford treatment regimen through collaboration with PharmD and provider.  Interventions: . 1:1 collaboration with Susy Frizzle, MD regarding development and update of comprehensive plan of care as evidenced by provider attestation and co-signature . Inter-disciplinary care team collaboration (see longitudinal plan of care) . Comprehensive medication review performed; medication list updated in electronic medical record  Hypertension (BP goal <130/80) -controlled -Current treatment: . Amlodipine 43m . Clonidine 0.2315mdaily . Doxazosin 15m6maily . Hydralazine 45m71mily . Labetalol 300mg39m . Lisinopril 20mg 48mications previously tried: none noted -Denies hypotensive/hypertensive symptoms    Diabetes (A1c goal <7%) -uncontrolled -Current medications: . Basaglar 100u/ml kwikpen 56 units daily . Humalog 100u/ml 7 units once daily . Trulicity 0.75mg8.92JJ/9.4RDcations previously tried: none noted -Educated onPAP program through [employer] [place]/21 -[place]ogram to determine status of application with Trulicity.  Representative notified me that he is approved through 07/23/21.  Will need to renew both Basaglar and Trulicity at that time.   Patient Goals/Self-Care Activities . Over the next 30 days, patient will:  - take medications as prescribed check glucose daily, document, and provide at future appointments  Confirm receipt of Trulicity through  PAP  Follow Up Plan: The care management team will reach out to the patient again over the next 30 days.         The patient verbalized understanding of instructions, educational materials, and care plan provided today and agreed to receive a mailed copy of patient instructions, educational materials, and care plan.  Telephone follow up appointment with pharmacy team member scheduled for: 4 months  [facility] Pressure Blood pressure is a measurement of how strongly your blood is pressing against the walls of your arteries. Arteries are blood vessels that carry blood from your heart throughout your body. Your health care provider takes your blood pressure at each office visit. You can also take your own blood pressure at home with a blood pressure monitor. You may need to take your own blood pressure to:  Confirm a diagnosis of high blood pressure (hypertension).  Monitor your blood pressure over time.  Make sure your blood pressure medicine is working. Supplies needed:  Blood pressure monitor.  Dining room chair to sit in.  Table or desk.  Small notebook and pencil or pen. How to prepare To get the most accurate reading, avoid the following for 30 minutes before you check your blood pressure:  Drinking caffeine.  Drinking alcohol.  Eating.  Smoking.  Exercising. Five minutes before you check your blood pressure:  Use the bathroom and urinate so that you have an empty bladder.  Sit quietly in a dining room chair. Do not sit in a soft couch or an armchair. Do not talk. How to take your blood pressure To check your blood pressure, follow the instructions in the manual that came with your blood pressure monitor. If you have a digital blood pressure monitor, the  instructions may be as follows: 1. Sit up straight in a chair. 2. Place your feet on the floor. Do not cross your ankles or legs. 3. Rest your left arm at the level of your  heart on a table or desk or on the arm of a chair. 4. Pull up your shirt sleeve. 5. Wrap the blood pressure cuff around the upper part of your left arm, 1 inch (2.5 cm) above your elbow. It is best to wrap the cuff around bare skin. 6. Fit the cuff snugly around your arm. You should be able to place only one finger between the cuff and your arm. 7. Position the cord so that it rests in the bend of your elbow. 8. Press the power button. 9. Sit quietly while the cuff inflates and deflates. 10. Read the digital reading on the monitor screen and write the numbers down (record them) in a notebook. 11. Wait 2-3 minutes, then repeat the steps, starting at step 1.   What does my blood pressure reading mean? A blood pressure reading consists of a higher number over a lower number. Ideally, your blood pressure should be below 120/80. The first ("top") number is called the systolic pressure. It is a measure of the pressure in your arteries as your heart beats. The second ("bottom") number is called the diastolic pressure. It is a measure of the pressure in your arteries as the heart relaxes. Blood pressure is classified into five stages. The following are the stages for adults who do not have a short-term serious illness or a chronic condition. Systolic pressure and diastolic pressure are measured in a unit called mm Hg (millimeters of mercury).  Normal  Systolic pressure: below 245.  Diastolic pressure: below 80. Elevated  Systolic pressure: 809-983.  Diastolic pressure: below 80. Hypertension stage 1  Systolic pressure: 382-505.  Diastolic pressure: 39-76. Hypertension stage 2  Systolic pressure: 734 or above.  Diastolic pressure: 90 or above. You can have elevated blood pressure or hypertension even if only the systolic or only the diastolic number in your reading is higher than normal. Follow these instructions at home:  Check your blood pressure as often as recommended by your health  care provider.  Check your blood pressure at the same time every day.  Take your monitor to the next appointment with your health care provider to make sure that: ? You are using it correctly. ? It provides accurate readings.  Be sure you understand what your goal blood pressure numbers are.  Tell your health care provider if you are having any side effects from blood pressure medicine.  Keep all follow-up visits as told by your health care provider. This is important. General tips  Your health care provider can suggest a reliable monitor that will meet your needs. There are several types of home blood pressure monitors.  Choose a monitor that has an arm cuff. Do not choose a monitor that measures your blood pressure from your wrist or finger.  Choose a cuff that wraps snugly around your upper arm. You should be able to fit only one finger between your arm and the cuff.  You can buy a blood pressure monitor at most drugstores or online. Where to find more information American Heart Association: www.heart.org Contact a health care provider if:  Your blood pressure is consistently high. Get help right away if:  Your systolic blood pressure is higher than 180.  Your diastolic blood pressure is higher than 120. Summary  Blood  pressure is a measurement of how strongly your blood is pressing against the walls of your arteries.  A blood pressure reading consists of a higher number over a lower number. Ideally, your blood pressure should be below 120/80.  Check your blood pressure at the same time every day.  Avoid caffeine, alcohol, smoking, and exercise for 30 minutes prior to checking your blood pressure. These agents can affect the accuracy of the blood pressure reading. This information is not intended to replace advice given to you by your health care provider. Make sure you discuss any questions you have with your health care provider. Document Revised: 07/04/2019 Document  Reviewed: 07/04/2019 Elsevier Patient Education  2021 [employer].

## 2020-11-17 DIAGNOSIS — I872 Venous insufficiency (chronic) (peripheral): Secondary | ICD-10-CM | POA: Diagnosis not present

## 2020-11-17 DIAGNOSIS — E1151 Type 2 diabetes mellitus with diabetic peripheral angiopathy without gangrene: Secondary | ICD-10-CM | POA: Diagnosis not present

## 2020-11-17 DIAGNOSIS — Z79899 Other long term (current) drug therapy: Secondary | ICD-10-CM | POA: Diagnosis not present

## 2020-11-17 DIAGNOSIS — M160 Bilateral primary osteoarthritis of hip: Secondary | ICD-10-CM | POA: Diagnosis not present

## 2020-11-17 DIAGNOSIS — M479 Spondylosis, unspecified: Secondary | ICD-10-CM | POA: Diagnosis not present

## 2020-11-17 DIAGNOSIS — G4733 Obstructive sleep apnea (adult) (pediatric): Secondary | ICD-10-CM | POA: Diagnosis not present

## 2020-11-17 DIAGNOSIS — M109 Gout, unspecified: Secondary | ICD-10-CM | POA: Diagnosis not present

## 2020-11-17 DIAGNOSIS — N1831 Chronic kidney disease, stage 3a: Secondary | ICD-10-CM | POA: Diagnosis not present

## 2020-11-17 DIAGNOSIS — I69320 Aphasia following cerebral infarction: Secondary | ICD-10-CM | POA: Diagnosis not present

## 2020-11-17 DIAGNOSIS — E1122 Type 2 diabetes mellitus with diabetic chronic kidney disease: Secondary | ICD-10-CM | POA: Diagnosis not present

## 2020-11-17 DIAGNOSIS — I129 Hypertensive chronic kidney disease with stage 1 through stage 4 chronic kidney disease, or unspecified chronic kidney disease: Secondary | ICD-10-CM | POA: Diagnosis not present

## 2020-11-17 DIAGNOSIS — I69351 Hemiplegia and hemiparesis following cerebral infarction affecting right dominant side: Secondary | ICD-10-CM | POA: Diagnosis not present

## 2020-11-17 DIAGNOSIS — I69322 Dysarthria following cerebral infarction: Secondary | ICD-10-CM | POA: Diagnosis not present

## 2020-11-17 DIAGNOSIS — Z993 Dependence on wheelchair: Secondary | ICD-10-CM | POA: Diagnosis not present

## 2020-11-17 DIAGNOSIS — Z794 Long term (current) use of insulin: Secondary | ICD-10-CM | POA: Diagnosis not present

## 2020-11-17 DIAGNOSIS — Z9181 History of falling: Secondary | ICD-10-CM | POA: Diagnosis not present

## 2020-11-17 DIAGNOSIS — I779 Disorder of arteries and arterioles, unspecified: Secondary | ICD-10-CM | POA: Diagnosis not present

## 2020-11-17 DIAGNOSIS — E782 Mixed hyperlipidemia: Secondary | ICD-10-CM | POA: Diagnosis not present

## 2020-11-23 DIAGNOSIS — I872 Venous insufficiency (chronic) (peripheral): Secondary | ICD-10-CM | POA: Diagnosis not present

## 2020-11-23 DIAGNOSIS — I129 Hypertensive chronic kidney disease with stage 1 through stage 4 chronic kidney disease, or unspecified chronic kidney disease: Secondary | ICD-10-CM | POA: Diagnosis not present

## 2020-11-23 DIAGNOSIS — G4733 Obstructive sleep apnea (adult) (pediatric): Secondary | ICD-10-CM | POA: Diagnosis not present

## 2020-11-23 DIAGNOSIS — E1122 Type 2 diabetes mellitus with diabetic chronic kidney disease: Secondary | ICD-10-CM | POA: Diagnosis not present

## 2020-11-23 DIAGNOSIS — I779 Disorder of arteries and arterioles, unspecified: Secondary | ICD-10-CM | POA: Diagnosis not present

## 2020-11-23 DIAGNOSIS — Z79899 Other long term (current) drug therapy: Secondary | ICD-10-CM | POA: Diagnosis not present

## 2020-11-23 DIAGNOSIS — M109 Gout, unspecified: Secondary | ICD-10-CM | POA: Diagnosis not present

## 2020-11-23 DIAGNOSIS — Z9181 History of falling: Secondary | ICD-10-CM | POA: Diagnosis not present

## 2020-11-23 DIAGNOSIS — I69351 Hemiplegia and hemiparesis following cerebral infarction affecting right dominant side: Secondary | ICD-10-CM | POA: Diagnosis not present

## 2020-11-23 DIAGNOSIS — I69322 Dysarthria following cerebral infarction: Secondary | ICD-10-CM | POA: Diagnosis not present

## 2020-11-23 DIAGNOSIS — E782 Mixed hyperlipidemia: Secondary | ICD-10-CM | POA: Diagnosis not present

## 2020-11-23 DIAGNOSIS — N1831 Chronic kidney disease, stage 3a: Secondary | ICD-10-CM | POA: Diagnosis not present

## 2020-11-23 DIAGNOSIS — E1151 Type 2 diabetes mellitus with diabetic peripheral angiopathy without gangrene: Secondary | ICD-10-CM | POA: Diagnosis not present

## 2020-11-23 DIAGNOSIS — M479 Spondylosis, unspecified: Secondary | ICD-10-CM | POA: Diagnosis not present

## 2020-11-23 DIAGNOSIS — Z794 Long term (current) use of insulin: Secondary | ICD-10-CM | POA: Diagnosis not present

## 2020-11-23 DIAGNOSIS — Z993 Dependence on wheelchair: Secondary | ICD-10-CM | POA: Diagnosis not present

## 2020-11-23 DIAGNOSIS — M160 Bilateral primary osteoarthritis of hip: Secondary | ICD-10-CM | POA: Diagnosis not present

## 2020-11-23 DIAGNOSIS — I69320 Aphasia following cerebral infarction: Secondary | ICD-10-CM | POA: Diagnosis not present

## 2020-11-27 ENCOUNTER — Other Ambulatory Visit: Payer: Self-pay | Admitting: Family Medicine

## 2020-11-30 DIAGNOSIS — I129 Hypertensive chronic kidney disease with stage 1 through stage 4 chronic kidney disease, or unspecified chronic kidney disease: Secondary | ICD-10-CM | POA: Diagnosis not present

## 2020-11-30 DIAGNOSIS — G4733 Obstructive sleep apnea (adult) (pediatric): Secondary | ICD-10-CM | POA: Diagnosis not present

## 2020-11-30 DIAGNOSIS — Z79899 Other long term (current) drug therapy: Secondary | ICD-10-CM | POA: Diagnosis not present

## 2020-11-30 DIAGNOSIS — Z794 Long term (current) use of insulin: Secondary | ICD-10-CM | POA: Diagnosis not present

## 2020-11-30 DIAGNOSIS — E1122 Type 2 diabetes mellitus with diabetic chronic kidney disease: Secondary | ICD-10-CM | POA: Diagnosis not present

## 2020-11-30 DIAGNOSIS — I779 Disorder of arteries and arterioles, unspecified: Secondary | ICD-10-CM | POA: Diagnosis not present

## 2020-11-30 DIAGNOSIS — E1151 Type 2 diabetes mellitus with diabetic peripheral angiopathy without gangrene: Secondary | ICD-10-CM | POA: Diagnosis not present

## 2020-11-30 DIAGNOSIS — N1831 Chronic kidney disease, stage 3a: Secondary | ICD-10-CM | POA: Diagnosis not present

## 2020-11-30 DIAGNOSIS — I872 Venous insufficiency (chronic) (peripheral): Secondary | ICD-10-CM | POA: Diagnosis not present

## 2020-11-30 DIAGNOSIS — Z9181 History of falling: Secondary | ICD-10-CM | POA: Diagnosis not present

## 2020-11-30 DIAGNOSIS — Z993 Dependence on wheelchair: Secondary | ICD-10-CM | POA: Diagnosis not present

## 2020-11-30 DIAGNOSIS — I69322 Dysarthria following cerebral infarction: Secondary | ICD-10-CM | POA: Diagnosis not present

## 2020-11-30 DIAGNOSIS — I69351 Hemiplegia and hemiparesis following cerebral infarction affecting right dominant side: Secondary | ICD-10-CM | POA: Diagnosis not present

## 2020-11-30 DIAGNOSIS — M109 Gout, unspecified: Secondary | ICD-10-CM | POA: Diagnosis not present

## 2020-11-30 DIAGNOSIS — I69320 Aphasia following cerebral infarction: Secondary | ICD-10-CM | POA: Diagnosis not present

## 2020-11-30 DIAGNOSIS — M479 Spondylosis, unspecified: Secondary | ICD-10-CM | POA: Diagnosis not present

## 2020-11-30 DIAGNOSIS — E782 Mixed hyperlipidemia: Secondary | ICD-10-CM | POA: Diagnosis not present

## 2020-11-30 DIAGNOSIS — M160 Bilateral primary osteoarthritis of hip: Secondary | ICD-10-CM | POA: Diagnosis not present

## 2020-12-07 DIAGNOSIS — K529 Noninfective gastroenteritis and colitis, unspecified: Secondary | ICD-10-CM | POA: Diagnosis not present

## 2020-12-07 DIAGNOSIS — K5909 Other constipation: Secondary | ICD-10-CM | POA: Diagnosis not present

## 2020-12-16 ENCOUNTER — Other Ambulatory Visit: Payer: Self-pay | Admitting: Family Medicine

## 2020-12-16 DIAGNOSIS — I10 Essential (primary) hypertension: Secondary | ICD-10-CM

## 2020-12-21 DIAGNOSIS — E1151 Type 2 diabetes mellitus with diabetic peripheral angiopathy without gangrene: Secondary | ICD-10-CM | POA: Diagnosis not present

## 2020-12-21 DIAGNOSIS — E1142 Type 2 diabetes mellitus with diabetic polyneuropathy: Secondary | ICD-10-CM | POA: Diagnosis not present

## 2020-12-21 DIAGNOSIS — I69359 Hemiplegia and hemiparesis following cerebral infarction affecting unspecified side: Secondary | ICD-10-CM | POA: Diagnosis not present

## 2020-12-21 DIAGNOSIS — B351 Tinea unguium: Secondary | ICD-10-CM | POA: Diagnosis not present

## 2020-12-28 ENCOUNTER — Telehealth: Payer: Self-pay

## 2020-12-28 NOTE — Progress Notes (Addendum)
Chronic Care Management Pharmacy Assistant   Name: HAIM HANSSON  MRN: [id number] DOB: Jul 17, 1956  Reason for Encounter: Disease State For DM.   Conditions to be addressed/monitored:  DM, HTN   Recent office visits:  None since 11/15/20  Recent consult visits:  None since 11/15/20  Hospital visits:  None since 11/15/20  Medications: Outpatient Encounter Medications as of 12/28/2020  Medication Sig   ACCU-CHEK FASTCLIX LANCETS MISC Check BS BID DX - E11.9   allopurinol (ZYLOPRIM) 100 MG tablet Take 1 tablet by mouth twice daily   amLODipine (NORVASC) 10 MG tablet Take 1 tablet (10 mg total) by mouth daily.   amoxicillin-clavulanate (AUGMENTIN) 875-125 MG tablet Take 1 tablet by mouth 2 (two) times daily. One po bid x 7 days   aspirin 81 MG tablet Take 81 mg by mouth every evening.    atorvastatin (LIPITOR) 40 MG tablet Take 1 tablet by mouth once daily   baclofen (LIORESAL) 10 MG tablet Take 0.5-1 tablets (5-10 mg total) by mouth 3 (three) times daily as needed for muscle spasms.   bisacodyl (BISACODYL) 5 MG EC tablet Take 1 tablet (5 mg total) by mouth daily as needed for moderate constipation.   Blood Glucose Monitoring Suppl (ACCU-CHEK AVIVA PLUS) w/Device KIT Check BS BID DX - E11.9   cloNIDine (CATAPRES) 0.2 MG tablet Take 1 tablet (0.2 mg total) by mouth 2 (two) times daily.   doxazosin (CARDURA) 4 MG tablet Take 1 tablet by mouth once daily   Dulaglutide (TRULICITY) 6.12 AE/4.9PN SOPN Inject 0.75 mg into the skin once a week.   glucose blood (ACCU-CHEK AVIVA) test strip Check BS BID DX - E11.9   hydrALAZINE (APRESOLINE) 25 MG tablet TAKE 2 TABLETS BY MOUTH THREE TIMES DAILY   hydrochlorothiazide (HYDRODIURIL) 25 MG tablet Take 1 tablet (25 mg total) by mouth daily.   Insulin Glargine (BASAGLAR KWIKPEN) 100 UNIT/ML Inject 56 Units into the skin daily.   labetalol (NORMODYNE) 300 MG tablet Take 1 tablet by mouth twice daily   Lancets Misc. (ACCU-CHEK FASTCLIX LANCET) KIT  Check BS BID DX - E11.9   lisinopril (ZESTRIL) 20 MG tablet Take 1 tablet by mouth twice daily   Multiple Vitamin (MULTIVITAMIN WITH MINERALS) TABS tablet Take 1 tablet by mouth daily.   polyethylene glycol powder (GLYCOLAX/MIRALAX) powder Take 17 g by mouth daily.   potassium chloride SA (KLOR-CON) 20 MEQ tablet Take 1 tablet by mouth once daily   traMADol (ULTRAM) 50 MG tablet TAKE 1 TABLET BY MOUTH EVERY 6 HOURS AS NEEDED   No facility-administered encounter medications on file as of 12/28/2020.   Recent Relevant Labs: Lab Results  Component Value Date/Time   HGBA1C >14.0 (H) 07/13/2020 02:29 PM   HGBA1C 7.3 (H) 09/29/2019 04:03 PM   MICROALBUR 39.1 06/29/2015 10:43 AM   MICROALBUR 59.15 (H) 03/13/2014 11:24 AM    Kidney Function Lab Results  Component Value Date/Time   CREATININE 1.79 (H) 10/24/2020 09:45 PM   CREATININE 1.94 (H) 08/12/2020 04:49 PM   CREATININE 1.57 (H) 07/16/2020 03:33 AM   CREATININE 1.65 (H) 07/13/2020 02:29 PM   GFRNONAA 42 (L) 10/24/2020 09:45 PM   GFRNONAA 36 (L) 08/12/2020 04:49 PM   GFRAA 42 (L) 08/12/2020 04:49 PM    Current antihyperglycemic regimen:  Basaglar 100u/ml kwikpen 65 units daily Trulicity 3.00FR/1.0YT Inject 0.75 mg into the skin once a week.  What recent interventions/DTPs have been made to improve glycemic control: Basaglar 100u/ml kwikpen 65 units daily  Have there been any recent hospitalizations or ED visits since last visit with CPP?  Patients sister stated no.  Patients sister denies hypoglycemic symptoms, including None   Patients sister denies hyperglycemic symptoms, including none   How often are you checking your blood sugar? Patients sister stated  twice daily   What are your blood sugars ranging?  Patients sister stated high 70's-low 80's and the highest its been is  110  During the week, how often does your blood glucose drop below 70? Patients sister stated Never   Are you checking your feet daily/regularly?   Patients sister stated his feet are red but no sores and they check on a regularly.   Adherence Review: Is the patient currently on a STATIN medication? Atorvastatin 40 mg   Is the patient currently on ACE/ARB medication? Lisinopril 20 mg   Does the patient have >5 day gap between last estimated fill dates? Per misc rpts, no.  Patient has not got a recent [facility] reading/blood draw but I made his sister aware and will mention it to Dr. Dennard Schaumann at the patients next doctor visit.   Star Rating Drugs: Atorvastatin 40 mg 90 DS 11/29/20 Lisinopril 20 mg 90 DS 11/29/20  Follow-Up: Pharmacist Review  Charlann Lange, RMA Clinical Pharmacist Assistant [phone]  10 minutes spent in review, coordination, and documentation.  Reviewed by: Beverly Milch, PharmD Clinical Pharmacist [facility] [phone]

## 2021-01-10 ENCOUNTER — Telehealth: Payer: Self-pay

## 2021-01-11 NOTE — Telephone Encounter (Signed)
Spoke with Manuela Schwartz regarding blood sugar monitoring. She

## 2021-01-15 ENCOUNTER — Other Ambulatory Visit: Payer: Self-pay | Admitting: Family Medicine

## 2021-01-15 DIAGNOSIS — I10 Essential (primary) hypertension: Secondary | ICD-10-CM

## 2021-01-28 DIAGNOSIS — I69359 Hemiplegia and hemiparesis following cerebral infarction affecting unspecified side: Secondary | ICD-10-CM | POA: Diagnosis not present

## 2021-01-28 DIAGNOSIS — K529 Noninfective gastroenteritis and colitis, unspecified: Secondary | ICD-10-CM | POA: Diagnosis not present

## 2021-01-28 DIAGNOSIS — K5909 Other constipation: Secondary | ICD-10-CM | POA: Diagnosis not present

## 2021-02-05 ENCOUNTER — Other Ambulatory Visit: Payer: Self-pay | Admitting: Family Medicine

## 2021-02-07 ENCOUNTER — Ambulatory Visit: Payer: Medicare Other | Admitting: Family Medicine

## 2021-02-17 ENCOUNTER — Other Ambulatory Visit: Payer: Self-pay | Admitting: Family Medicine

## 2021-02-17 DIAGNOSIS — I10 Essential (primary) hypertension: Secondary | ICD-10-CM

## 2021-02-18 ENCOUNTER — Ambulatory Visit: Payer: Medicare Other | Admitting: Family Medicine

## 2021-02-25 ENCOUNTER — Encounter: Payer: Self-pay | Admitting: Family Medicine

## 2021-02-25 ENCOUNTER — Other Ambulatory Visit: Payer: Self-pay

## 2021-02-25 ENCOUNTER — Ambulatory Visit (INDEPENDENT_AMBULATORY_CARE_PROVIDER_SITE_OTHER): Payer: Medicare Other | Admitting: Family Medicine

## 2021-02-25 VITALS — BP 128/68 | HR 90 | Temp 99.1°F | Resp 16

## 2021-02-25 DIAGNOSIS — I10 Essential (primary) hypertension: Secondary | ICD-10-CM

## 2021-02-25 DIAGNOSIS — E1165 Type 2 diabetes mellitus with hyperglycemia: Secondary | ICD-10-CM

## 2021-02-25 DIAGNOSIS — G8111 Spastic hemiplegia affecting right dominant side: Secondary | ICD-10-CM

## 2021-02-25 MED ORDER — SILVER SULFADIAZINE 1 % EX CREA: 1.0000 "application " | TOPICAL_CREAM | Freq: Every day | CUTANEOUS | 0 refills | Status: DC

## 2021-02-25 NOTE — Progress Notes (Signed)
Subjective:    Patient ID: Eduardo Young, male    DOB: 1956-03-16, 65 y.o.   MRN: [id number] Patient has several issues.  First regarding his diabetes, he reports his fasting blood sugars between 80 and 120 which sound excellent.  His 2-hour postprandial sugars in the evening are between 180 and 220 which are slightly high but for this individual sound very good.  He is on Trulicity and insulin.  He is not having hypoglycemic episodes.  He does have a history of chronic kidney disease with a creatinine between 1.6 and 1.7.  He is not on Farxiga.  He is on a lisinopril.  He does have a scab over his right greater trochanteric bursa.  He fell about 3 months ago and suffered a wound which has been slowly healing.  The skin is completely covered in that area now.  There is a erythematous patch about 5 cm in diameter.  There is a scab is about 3 cm in diameter that is fraying up on the edges.  There is no drainage fluctuance or warmth.  However the patient sleeps on that side and he also rubs that side against his wheelchair so there may be an element of a pressure sore.  There is no evidence of secondary infection.  He does have several moles that he wanted me to check.  Each of the moles is less than 6 mm in diameter with smooth, regular sharp borders, a light brown color, and no concerning features.  Two are on his abdomen and and 1 is on his left shoulder Past Medical History:  Diagnosis Date   Chronic venous insufficiency    Diabetes mellitus    GERD (gastroesophageal reflux disease)    Gout    Hemiparesis (HCC)    Hyperlipidemia    Hypertension    Obesity    Renal calculi    Sleep apnea, obstructive    Stroke [facility])    [id number]   Past Surgical History:  Procedure Laterality Date   BIOPSY  09/06/2018   Procedure: BIOPSY;  Surgeon: Jerene Bears, MD;  Location: [facility];  Service: Gastroenterology;;   COLONOSCOPY WITH PROPOFOL N/A 09/06/2018   Procedure: COLONOSCOPY WITH PROPOFOL;   Surgeon: Jerene Bears, MD;  Location: [facility];  Service: Gastroenterology;  Laterality: N/A;   LITHOTRIPSY     NASAL POLYP EXCISION     Current Outpatient Medications on File Prior to Visit  Medication Sig Dispense Refill   ACCU-CHEK FASTCLIX LANCETS MISC Check BS BID DX - E11.9 200 each 3   allopurinol (ZYLOPRIM) 100 MG tablet Take 1 tablet by mouth twice daily 180 tablet 0   amLODipine (NORVASC) 10 MG tablet Take 1 tablet by mouth once daily 30 tablet 0   amoxicillin-clavulanate (AUGMENTIN) 875-125 MG tablet Take 1 tablet by mouth 2 (two) times daily. One po bid x 7 days 14 tablet 0   aspirin 81 MG tablet Take 81 mg by mouth every evening.      atorvastatin (LIPITOR) 40 MG tablet Take 1 tablet by mouth once daily 90 tablet 0   baclofen (LIORESAL) 10 MG tablet Take 0.5-1 tablets (5-10 mg total) by mouth 3 (three) times daily as needed for muscle spasms. 30 each 3   bisacodyl (BISACODYL) 5 MG EC tablet Take 1 tablet (5 mg total) by mouth daily as needed for moderate constipation. 30 tablet 0   Blood Glucose Monitoring Suppl (ACCU-CHEK AVIVA PLUS) w/Device KIT Check BS BID DX - E11.9  1 kit 1   cloNIDine (CATAPRES) 0.2 MG tablet Take 1 tablet by mouth twice daily 60 tablet 0   doxazosin (CARDURA) 4 MG tablet Take 1 tablet by mouth once daily 90 tablet 0   Dulaglutide (TRULICITY) 5.91 MB/8.4YK SOPN Inject 0.75 mg into the skin once a week. 2 mL 3   glucose blood (ACCU-CHEK AVIVA) test strip Check BS BID DX - E11.9 150 each 3   hydrALAZINE (APRESOLINE) 25 MG tablet TAKE 2 TABLETS BY MOUTH THREE TIMES DAILY 180 tablet 0   hydrochlorothiazide (HYDRODIURIL) 25 MG tablet Take 1 tablet by mouth once daily 90 tablet 0   Insulin Glargine (BASAGLAR KWIKPEN) 100 UNIT/ML Inject 56 Units into the skin daily.     labetalol (NORMODYNE) 300 MG tablet Take 1 tablet by mouth twice daily 180 tablet 0   Lancets Misc. (ACCU-CHEK FASTCLIX LANCET) KIT Check BS BID DX - E11.9 1 kit 1   lisinopril (ZESTRIL) 20 MG  tablet Take 1 tablet by mouth twice daily 180 tablet 0   Multiple Vitamin (MULTIVITAMIN WITH MINERALS) TABS tablet Take 1 tablet by mouth daily.     polyethylene glycol powder (GLYCOLAX/MIRALAX) powder Take 17 g by mouth daily. 510 g 11   potassium chloride SA (KLOR-CON) 20 MEQ tablet Take 1 tablet by mouth once daily 30 tablet 0   traMADol (ULTRAM) 50 MG tablet TAKE 1 TABLET BY MOUTH EVERY 6 HOURS AS NEEDED 30 tablet 0   No current facility-administered medications on file prior to visit.   No Known Allergies Social History   Socioeconomic History   Marital status: Single    Spouse name: Not on file   Number of children: Not on file   Years of education: Not on file   Highest education level: Not on file  Occupational History   Not on file  Tobacco Use   Smoking status: Former   Smokeless tobacco: Never  Vaping Use   Vaping Use: Never used  Substance and Sexual Activity   Alcohol use: Yes    Comment: Occasional   Drug use: Not Currently    Types: Cocaine, Marijuana   Sexual activity: Not on file  Other Topics Concern   Not on file  Social History Narrative   Not on file   Social Determinants of Health   Financial Resource Strain: Medium Risk   Difficulty of Paying Living Expenses: Somewhat hard  Food Insecurity: Not on file  Transportation Needs: Not on file  Physical Activity: Not on file  Stress: Not on file  Social Connections: Not on file  Intimate Partner Violence: Not on file      Review of Systems  All other systems reviewed and are negative.      Objective:   Physical Exam Vitals reviewed.  Constitutional:      General: He is not in acute distress.    Appearance: He is well-developed. He is not diaphoretic.  Cardiovascular:     Rate and Rhythm: Normal rate and regular rhythm.     Heart sounds: Normal heart sounds. No murmur heard. Pulmonary:     Effort: Pulmonary effort is normal. No respiratory distress.     Breath sounds: Normal breath  sounds. No wheezing or rales.  Chest:     Chest wall: No tenderness.  Neurological:     Mental Status: He is alert and oriented to person, place, and time. Mental status is at baseline.     Cranial Nerves: Cranial nerves are intact.  Motor: Weakness present.     Coordination: Coordination abnormal.     Gait: Gait abnormal.      Uncontrolled type 2 diabetes mellitus with hyperglycemia (HCC) - Plan: Hemoglobin A1c, CBC with Differential/Platelet, COMPLETE METABOLIC PANEL WITH GFR, Lipid panel  Spastic hemiparesis of right dominant side (HCC)  Benign essential HTN Sugars sound better controlled today than they have been in the past.  I will check an A1c, CBC, CMP and a lipid panel.  Blood pressure today is outstanding.  Check renal function, we discussed adding Farxiga to prevent further deterioration in his renal function however family would like to wait for the results first.  I am very happy with his blood pressure today and his reported sugars sound better than I have heard from him.  I believe the scab over his right hip is healing slowly.  I recommended adding Silvadene once a day to the area and a bandage to keep the area moist and facilitate healing.  I also recommended offloading pressure to facilitate healing.  The moles are benign in appearance and do not require biopsy.  I did recommend that he start taking a stool softener every day to avoid constipation as he is having frequent episodes of crampy abdominal pain

## 2021-02-26 LAB — CBC WITH DIFFERENTIAL/PLATELET
Absolute Monocytes: 618 cells/uL (ref 200–950)
Basophils Absolute: 48 cells/uL (ref 0–200)
Basophils Relative: 0.5 %
Eosinophils Absolute: 380 cells/uL (ref 15–500)
Eosinophils Relative: 4 %
HCT: 38 % — ABNORMAL LOW (ref 38.5–50.0)
Hemoglobin: 12.3 g/dL — ABNORMAL LOW (ref 13.2–17.1)
Lymphs Abs: 2157 cells/uL (ref 850–3900)
MCH: 27.9 pg (ref 27.0–33.0)
MCHC: 32.4 g/dL (ref 32.0–36.0)
MCV: 86.2 fL (ref 80.0–100.0)
MPV: 10.4 fL (ref 7.5–12.5)
Monocytes Relative: 6.5 %
Neutro Abs: 6299 cells/uL (ref 1500–7800)
Neutrophils Relative %: 66.3 %
Platelets: 258 10*3/uL (ref 140–400)
RBC: 4.41 10*6/uL (ref 4.20–5.80)
RDW: 14.5 % (ref 11.0–15.0)
Total Lymphocyte: 22.7 %
WBC: 9.5 10*3/uL (ref 3.8–10.8)

## 2021-02-26 LAB — LIPID PANEL
Cholesterol: 89 mg/dL (ref <200)
HDL: 23 mg/dL — ABNORMAL LOW (ref >=40)
LDL Cholesterol (Calc): 44 mg/dL (calc)
Non-HDL Cholesterol (Calc): 66 mg/dL (calc) (ref <130)
Total CHOL/HDL Ratio: 3.9 (calc) (ref <5.0)
Triglycerides: 135 mg/dL (ref <150)

## 2021-02-26 LAB — COMPLETE METABOLIC PANEL WITH GFR
AG Ratio: 1.6 (calc) (ref 1.0–2.5)
ALT: 8 U/L — ABNORMAL LOW (ref 9–46)
AST: 10 U/L (ref 10–35)
Albumin: 3.7 g/dL (ref 3.6–5.1)
Alkaline phosphatase (APISO): 77 U/L (ref 35–144)
BUN/Creatinine Ratio: 15 (calc) (ref 6–22)
BUN: 24 mg/dL (ref 7–25)
CO2: 30 mmol/L (ref 20–32)
Calcium: 8.9 mg/dL (ref 8.6–10.3)
Chloride: 104 mmol/L (ref 98–110)
Creat: 1.62 mg/dL — ABNORMAL HIGH (ref 0.70–1.35)
Globulin: 2.3 g/dL (calc) (ref 1.9–3.7)
Glucose, Bld: 118 mg/dL — ABNORMAL HIGH (ref 65–99)
Potassium: 3.4 mmol/L — ABNORMAL LOW (ref 3.5–5.3)
Sodium: 144 mmol/L (ref 135–146)
Total Bilirubin: 0.4 mg/dL (ref 0.2–1.2)
Total Protein: 6 g/dL — ABNORMAL LOW (ref 6.1–8.1)
eGFR: 48 mL/min/{1.73_m2} — ABNORMAL LOW (ref >=60)

## 2021-02-26 LAB — HEMOGLOBIN A1C
Hgb A1c MFr Bld: 5.8 % of total Hgb — ABNORMAL HIGH (ref <5.7)
Mean Plasma Glucose: 120 mg/dL
eAG (mmol/L): 6.6 mmol/L

## 2021-03-04 ENCOUNTER — Other Ambulatory Visit: Payer: Self-pay | Admitting: *Deleted

## 2021-03-04 MED ORDER — BASAGLAR KWIKPEN 100 UNIT/ML ~~LOC~~ SOPN: 58.0000 [IU] | PEN_INJECTOR | Freq: Every day | SUBCUTANEOUS | Status: DC

## 2021-03-21 ENCOUNTER — Other Ambulatory Visit: Payer: Self-pay | Admitting: Family Medicine

## 2021-03-21 DIAGNOSIS — I10 Essential (primary) hypertension: Secondary | ICD-10-CM

## 2021-03-22 DIAGNOSIS — I69359 Hemiplegia and hemiparesis following cerebral infarction affecting unspecified side: Secondary | ICD-10-CM | POA: Diagnosis not present

## 2021-03-22 DIAGNOSIS — B351 Tinea unguium: Secondary | ICD-10-CM | POA: Diagnosis not present

## 2021-03-22 DIAGNOSIS — E1142 Type 2 diabetes mellitus with diabetic polyneuropathy: Secondary | ICD-10-CM | POA: Diagnosis not present

## 2021-03-22 DIAGNOSIS — E1151 Type 2 diabetes mellitus with diabetic peripheral angiopathy without gangrene: Secondary | ICD-10-CM | POA: Diagnosis not present

## 2021-03-31 ENCOUNTER — Telehealth: Payer: Self-pay

## 2021-04-08 ENCOUNTER — Telehealth: Payer: Self-pay | Admitting: Pharmacist

## 2021-04-08 NOTE — Progress Notes (Addendum)
Chronic Care Management Pharmacy Assistant   Name: Eduardo Young  MRN: [id number] DOB: 01-30-1956  Reason for Encounter: Disease State For DM   Conditions to be addressed/monitored:  DM, HTN  Recent office visits:  02/25/21 Dr. Dennard Schaumann For wound/follow-up. STARTED Silver Sulfadiazine 1% 1 application daily.   Recent consult visits:  None since 12/28/20  Hospital visits:  None since 12/28/20  Medications: Outpatient Encounter Medications as of 04/08/2021  Medication Sig   ACCU-CHEK FASTCLIX LANCETS MISC Check BS BID DX - E11.9   allopurinol (ZYLOPRIM) 100 MG tablet Take 1 tablet by mouth twice daily   amLODipine (NORVASC) 10 MG tablet Take 1 tablet by mouth once daily   aspirin 81 MG tablet Take 81 mg by mouth every evening.    atorvastatin (LIPITOR) 40 MG tablet Take 1 tablet by mouth once daily   baclofen (LIORESAL) 10 MG tablet Take 0.5-1 tablets (5-10 mg total) by mouth 3 (three) times daily as needed for muscle spasms.   bisacodyl (BISACODYL) 5 MG EC tablet Take 1 tablet (5 mg total) by mouth daily as needed for moderate constipation.   Blood Glucose Monitoring Suppl (ACCU-CHEK AVIVA PLUS) w/Device KIT Check BS BID DX - E11.9   cloNIDine (CATAPRES) 0.2 MG tablet Take 1 tablet by mouth twice daily   doxazosin (CARDURA) 4 MG tablet Take 1 tablet by mouth once daily   Dulaglutide (TRULICITY) 8.34 HD/6.2IW SOPN Inject 0.75 mg into the skin once a week.   glucose blood (ACCU-CHEK AVIVA) test strip Check BS BID DX - E11.9   hydrALAZINE (APRESOLINE) 25 MG tablet TAKE 2 TABLETS BY MOUTH THREE TIMES DAILY   hydrochlorothiazide (HYDRODIURIL) 25 MG tablet Take 1 tablet by mouth once daily   Insulin Glargine (BASAGLAR KWIKPEN) 100 UNIT/ML Inject 58 Units into the skin daily.   labetalol (NORMODYNE) 300 MG tablet Take 1 tablet by mouth twice daily   Lancets Misc. (ACCU-CHEK FASTCLIX LANCET) KIT Check BS BID DX - E11.9   lisinopril (ZESTRIL) 20 MG tablet Take 1 tablet by mouth twice daily    Multiple Vitamin (MULTIVITAMIN WITH MINERALS) TABS tablet Take 1 tablet by mouth daily.   polyethylene glycol powder (GLYCOLAX/MIRALAX) powder Take 17 g by mouth daily.   potassium chloride SA (KLOR-CON) 20 MEQ tablet Take 1 tablet by mouth once daily   silver sulfADIAZINE (SILVADENE) 1 % cream Apply 1 application topically daily.   traMADol (ULTRAM) 50 MG tablet TAKE 1 TABLET BY MOUTH EVERY 6 HOURS AS NEEDED   No facility-administered encounter medications on file as of 04/08/2021.   Recent Relevant Labs: Lab Results  Component Value Date/Time   HGBA1C 5.8 (H) 02/25/2021 12:57 PM   HGBA1C >14.0 (H) 07/13/2020 02:29 PM   MICROALBUR 39.1 06/29/2015 10:43 AM   MICROALBUR 59.15 (H) 03/13/2014 11:24 AM    Kidney Function Lab Results  Component Value Date/Time   CREATININE 1.62 (H) 02/25/2021 12:57 PM   CREATININE 1.79 (H) 10/24/2020 09:45 PM   CREATININE 1.94 (H) 08/12/2020 04:49 PM   GFRNONAA 42 (L) 10/24/2020 09:45 PM   GFRNONAA 36 (L) 08/12/2020 04:49 PM   GFRAA 42 (L) 08/12/2020 04:49 PM    Current antihyperglycemic regimen:  Basaglar 100u/ml kwikpen 65 units daily Trulicity 9.79GX/2.1JH Inject 0.75 mg into the skin once a week.  What recent interventions/DTPs have been made to improve glycemic control:  None.   Have there been any recent hospitalizations or ED visits since last visit with CPP? Patient stated no.   Patient  denies hypoglycemic symptoms, including None  Patient denies hyperglycemic symptoms, including none  How often are you checking your blood sugar? Patients sister stated 2-3 times a week.   What are your blood sugars ranging?  Patients sister stated its been within normal range.  During the week, how often does your blood glucose drop below 70? Patients sister stated Never.  Are you checking your feet daily/regularly?  Patients sister reminded to check his feet.   Adherence Review: Is the patient currently on a STATIN medication? Atorvastatin 40  mg  Is the patient currently on ACE/ARB medication? Lisinopril 20 mg   Does the patient have >5 day gap between last estimated fill dates?  Per misc rpts, no.  Care Gaps:Patient is due for his AWV, foot ( He goes to [facility],every 3 months or so per the patients sister) and eye exam and his HBA1C was 14 now it is 5.8.  Star Rating Drugs: Atorvastatin 40 mg 03/01/21 90 DS, Lisinopril 20 mg 02/27/85 90 DS, Trulicity 7.67 MC/9.4 ml 08/19/20 28 DS.  Follow-Up:Pharmacist Review  Charlann Lange, RMA Clinical Pharmacist Assistant [phone]  10 minutes spent in review, coordination, and documentation.  Chart reviewed, AWV was last week.  Reviewed chart for updated labs, etc.  Reviewed by: Beverly Milch, PharmD Clinical Pharmacist [phone]

## 2021-04-14 ENCOUNTER — Other Ambulatory Visit: Payer: Self-pay | Admitting: Family Medicine

## 2021-04-16 ENCOUNTER — Encounter: Payer: Self-pay | Admitting: Family Medicine

## 2021-04-16 ENCOUNTER — Ambulatory Visit (INDEPENDENT_AMBULATORY_CARE_PROVIDER_SITE_OTHER): Payer: Medicare Other | Admitting: Family Medicine

## 2021-04-16 DIAGNOSIS — Z0000 Encounter for general adult medical examination without abnormal findings: Secondary | ICD-10-CM

## 2021-04-19 ENCOUNTER — Other Ambulatory Visit: Payer: Self-pay | Admitting: Family Medicine

## 2021-04-23 ENCOUNTER — Other Ambulatory Visit: Payer: Self-pay | Admitting: Family Medicine

## 2021-04-23 DIAGNOSIS — I10 Essential (primary) hypertension: Secondary | ICD-10-CM

## 2021-04-26 ENCOUNTER — Other Ambulatory Visit: Payer: Self-pay | Admitting: Family Medicine

## 2021-04-26 DIAGNOSIS — I10 Essential (primary) hypertension: Secondary | ICD-10-CM

## 2021-04-26 MED ORDER — HYDRALAZINE HCL 25 MG PO TABS: 50.0000 mg | ORAL_TABLET | Freq: Three times a day (TID) | ORAL | 11 refills | Status: DC

## 2021-05-06 ENCOUNTER — Other Ambulatory Visit: Payer: Self-pay | Admitting: Family Medicine

## 2021-05-19 ENCOUNTER — Telehealth: Payer: Self-pay | Admitting: Pharmacist

## 2021-05-19 NOTE — Progress Notes (Signed)
    Chronic Care Management Pharmacy Assistant   Name: SYLVIA KONDRACKI  MRN: [id number] DOB: 03/01/1956  Reason for Encounter: Renewal PAP   Medications: Outpatient Encounter Medications as of 05/19/2021  Medication Sig   ACCU-CHEK FASTCLIX LANCETS MISC Check BS BID DX - E11.9   allopurinol (ZYLOPRIM) 100 MG tablet Take 1 tablet by mouth twice daily   amLODipine (NORVASC) 10 MG tablet Take 1 tablet by mouth once daily   aspirin 81 MG tablet Take 81 mg by mouth every evening.    atorvastatin (LIPITOR) 40 MG tablet Take 1 tablet by mouth once daily   baclofen (LIORESAL) 10 MG tablet Take 0.5-1 tablets (5-10 mg total) by mouth 3 (three) times daily as needed for muscle spasms.   bisacodyl (BISACODYL) 5 MG EC tablet Take 1 tablet (5 mg total) by mouth daily as needed for moderate constipation.   Blood Glucose Monitoring Suppl (ACCU-CHEK AVIVA PLUS) w/Device KIT Check BS BID DX - E11.9   cloNIDine (CATAPRES) 0.2 MG tablet Take 1 tablet by mouth twice daily   doxazosin (CARDURA) 4 MG tablet Take 1 tablet by mouth once daily   Dulaglutide (TRULICITY) 6.80 HO/1.2YQ SOPN Inject 0.75 mg into the skin once a week.   glucose blood (ACCU-CHEK AVIVA) test strip Check BS BID DX - E11.9   hydrALAZINE (APRESOLINE) 25 MG tablet TAKE 2 TABLETS BY MOUTH THREE TIMES DAILY   hydrochlorothiazide (HYDRODIURIL) 25 MG tablet Take 1 tablet by mouth once daily   Insulin Glargine (BASAGLAR KWIKPEN) 100 UNIT/ML Inject 58 Units into the skin daily.   labetalol (NORMODYNE) 300 MG tablet Take 1 tablet by mouth twice daily   Lancets Misc. (ACCU-CHEK FASTCLIX LANCET) KIT Check BS BID DX - E11.9   lisinopril (ZESTRIL) 20 MG tablet Take 1 tablet by mouth twice daily   Multiple Vitamin (MULTIVITAMIN WITH MINERALS) TABS tablet Take 1 tablet by mouth daily.   polyethylene glycol powder (GLYCOLAX/MIRALAX) powder Take 17 g by mouth daily.   potassium chloride SA (KLOR-CON) 20 MEQ tablet Take 1 tablet by mouth once daily    silver sulfADIAZINE (SILVADENE) 1 % cream Apply 1 application topically daily.   traMADol (ULTRAM) 50 MG tablet TAKE 1 TABLET BY MOUTH EVERY 6 HOURS AS NEEDED   No facility-administered encounter medications on file as of 05/19/2021.   New patient assistance application form filled out to [employer] for [employer]. Waiting for patient and provider to complete and sign documentation. Called patient to inquire if they wanted the application mailed to them or if they wanted to come into the office. Patient is required to sign application and to bring/have proof of income. He stated he would be willing to come into office to bring proof of income and sign application once all the paperwork came in the mail, he would like it mailed to their residence address [place] [id number].  Follow-Up:Pharmacist Review  Charlann Lange, Central Pharmacist Assistant [phone]

## 2021-05-22 ENCOUNTER — Other Ambulatory Visit: Payer: Self-pay | Admitting: Family Medicine

## 2021-05-28 ENCOUNTER — Other Ambulatory Visit: Payer: Self-pay | Admitting: Family Medicine

## 2021-05-30 ENCOUNTER — Telehealth: Payer: Self-pay

## 2021-05-30 MED ORDER — BASAGLAR KWIKPEN 100 UNIT/ML ~~LOC~~ SOPN: 58.0000 [IU] | PEN_INJECTOR | Freq: Every day | SUBCUTANEOUS | 2 refills | Status: DC

## 2021-05-30 NOTE — Telephone Encounter (Signed)
Pt's sister came in requesting a refill of Insulin Glargine (BASAGLAR KWIKPEN) 100 UNIT/ML. Sister stated that pt is down to last 2 pens of med.  Cb#: [phone]

## 2021-05-30 NOTE — Telephone Encounter (Signed)
Rx sent 

## 2021-05-31 ENCOUNTER — Other Ambulatory Visit: Payer: Self-pay | Admitting: *Deleted

## 2021-05-31 ENCOUNTER — Other Ambulatory Visit: Payer: Self-pay | Admitting: Family Medicine

## 2021-05-31 ENCOUNTER — Other Ambulatory Visit: Payer: Self-pay

## 2021-05-31 MED ORDER — BASAGLAR KWIKPEN 100 UNIT/ML ~~LOC~~ SOPN: 58.0000 [IU] | PEN_INJECTOR | Freq: Every day | SUBCUTANEOUS | 3 refills | Status: DC

## 2021-06-20 DIAGNOSIS — B351 Tinea unguium: Secondary | ICD-10-CM | POA: Diagnosis not present

## 2021-06-26 ENCOUNTER — Other Ambulatory Visit: Payer: Self-pay | Admitting: Family Medicine

## 2021-06-27 ENCOUNTER — Other Ambulatory Visit: Payer: Self-pay | Admitting: Family Medicine

## 2021-07-13 ENCOUNTER — Telehealth: Payer: Self-pay | Admitting: Pharmacist

## 2021-07-13 NOTE — Chronic Care Management (AMB) (Signed)
Chronic Care Management Pharmacy Assistant   Name: Eduardo Young  MRN: [id number] DOB: April 28, 1956   Reason for Encounter: Disease State - Diabetes Call    Recent office visits:  04/16/21 Eduardo Luo, MD (PCP) - Family Medicine - Medicare Wellness was completed.   Recent consult visits:  None noted.   Hospital visits:  None in previous 6 months  Medications: Outpatient Encounter Medications as of 07/13/2021  Medication Sig   ACCU-CHEK FASTCLIX LANCETS MISC Check BS BID DX - E11.9   allopurinol (ZYLOPRIM) 100 MG tablet Take 1 tablet by mouth twice daily   amLODipine (NORVASC) 10 MG tablet Take 1 tablet by mouth once daily   aspirin 81 MG tablet Take 81 mg by mouth every evening.    atorvastatin (LIPITOR) 40 MG tablet Take 1 tablet by mouth once daily   baclofen (LIORESAL) 10 MG tablet Take 0.5-1 tablets (5-10 mg total) by mouth 3 (three) times daily as needed for muscle spasms.   bisacodyl (BISACODYL) 5 MG EC tablet Take 1 tablet (5 mg total) by mouth daily as needed for moderate constipation.   Blood Glucose Monitoring Suppl (ACCU-CHEK AVIVA PLUS) w/Device KIT Check BS BID DX - E11.9   cloNIDine (CATAPRES) 0.2 MG tablet Take 1 tablet by mouth twice daily   doxazosin (CARDURA) 4 MG tablet Take 1 tablet by mouth once daily   Dulaglutide (TRULICITY) 9.02 IO/9.7DZ SOPN Inject 0.75 mg into the skin once a week.   glucose blood (ACCU-CHEK AVIVA) test strip Check BS BID DX - E11.9   hydrALAZINE (APRESOLINE) 25 MG tablet TAKE 2 TABLETS BY MOUTH THREE TIMES DAILY   hydrochlorothiazide (HYDRODIURIL) 25 MG tablet Take 1 tablet by mouth once daily   Insulin Glargine (BASAGLAR KWIKPEN) 100 UNIT/ML Inject 58 Units into the skin daily. [facility] [phone]~ fax   labetalol (NORMODYNE) 300 MG tablet Take 1 tablet by mouth twice daily   Lancets Misc. (ACCU-CHEK FASTCLIX LANCET) KIT Check BS BID DX - E11.9   lisinopril (ZESTRIL) 20 MG tablet Take 1 tablet by mouth twice  daily   Multiple Vitamin (MULTIVITAMIN WITH MINERALS) TABS tablet Take 1 tablet by mouth daily.   polyethylene glycol powder (GLYCOLAX/MIRALAX) powder Take 17 g by mouth daily.   potassium chloride SA (KLOR-CON M) 20 MEQ tablet Take 1 tablet by mouth once daily   silver sulfADIAZINE (SILVADENE) 1 % cream Apply 1 application topically daily.   traMADol (ULTRAM) 50 MG tablet TAKE 1 TABLET BY MOUTH EVERY 6 HOURS AS NEEDED   No facility-administered encounter medications on file as of 07/13/2021.    Current antihyperglycemic regimen:  Basaglar 100u/ml kwikpen 65 units daily Trulicity 3.29JM/4.2AS Inject 0.75 mg into the skin once a week.   What recent interventions/DTPs have been made to improve glycemic control:  Patient sister denied any recent changes to current medication regimen.   Have there been any recent hospitalizations or ED visits since last visit with CPP?  Patients sister denied any hospitalizations or ED visits since last visit  with CPP.   Patient denies hypoglycemic symptoms, including Pale, Sweaty, Shaky, Hungry, Nervous/irritable, and Vision changes   Patient denies hyperglycemic symptoms, including blurry vision, excessive thirst, fatigue, polyuria, and weakness   How often are you checking your blood sugar? Sister reported checking his blood sugar several times a week.    What are your blood sugars ranging?  Fasting:  Before meals: 120 After meals:  Bedtime:   During the week, how often  does your blood glucose drop below 70?  Patients sister denied having any blood sugars below 70.  Are you checking your feet daily/regularly? Sister reported checking his feet regularly and he sees a Podiatrist regularly.     Adherence Review: Is the patient currently on a STATIN medication? Yes Is the patient currently on ACE/ARB medication? Yes Does the patient have >5 day gap between last estimated fill dates? No   Care Gaps  AWV: done 04/21/21 Colonoscopy: done  09/06/18 DM Eye Exam: due 11/05/20 DM Foot Exam: overdue Microalbumin: unknown  HbgAIC: done 08/28/21 (5.8) DEXA: N/A Mammogram: N/A   Star Rating Drugs: Atorvastatin 40 mg - last filled 05/31/21 90 days  Lisinopril 20 mg - last filled 49/2/01 90 days  Trulicity 0.07 HQ/1.9 ml - last filled 05/31/2021 90 days   No future appointments.  Eduardo Young, [facility] Clinical Pharmacist Assistant  [phone]

## 2021-07-28 ENCOUNTER — Telehealth: Payer: Self-pay | Admitting: *Deleted

## 2021-07-28 NOTE — Telephone Encounter (Signed)
Pt sister called requesting--refill for Rx.Tramadol 50 mg prescribe by Dr. Junius Roads. Please advise

## 2021-07-29 MED ORDER — TRAMADOL HCL 50 MG PO TABS: 50.0000 mg | ORAL_TABLET | Freq: Four times a day (QID) | ORAL | 0 refills | Status: DC | PRN

## 2021-07-30 ENCOUNTER — Other Ambulatory Visit: Payer: Self-pay | Admitting: Family Medicine

## 2021-08-22 ENCOUNTER — Telehealth: Payer: Self-pay

## 2021-08-22 ENCOUNTER — Other Ambulatory Visit: Payer: Self-pay | Admitting: Family Medicine

## 2021-08-22 MED ORDER — INSULIN GLARGINE 100 UNIT/ML SOLOSTAR PEN: 58.0000 [IU] | PEN_INJECTOR | Freq: Every day | SUBCUTANEOUS | 3 refills | Status: DC

## 2021-08-22 NOTE — Telephone Encounter (Signed)
Per pharmacy pt has new ins and they do not cover Basaglar. They prefer: Levemir Toujeo Tresiba Lantus  Please advise, thanks!

## 2021-08-22 NOTE — Telephone Encounter (Signed)
Rx changed and sent to pharmacy.

## 2021-08-23 DIAGNOSIS — E1151 Type 2 diabetes mellitus with diabetic peripheral angiopathy without gangrene: Secondary | ICD-10-CM | POA: Diagnosis not present

## 2021-08-24 ENCOUNTER — Telehealth: Payer: Self-pay

## 2021-08-24 NOTE — Telephone Encounter (Signed)
Pt's sister called in wanting to speak with pharmacist Leata Mouse. Pt's sister states that pt is on a program to get insulin for free, but pt will be out of insulin in about 2 weeks. Pt's sister would like a cb asap please as she doesn't want pt to run out of this med.   Cb#: Please call sister at [phone]

## 2021-08-25 NOTE — Telephone Encounter (Signed)
Called and refaxed application for Trulicity

## 2021-08-30 ENCOUNTER — Other Ambulatory Visit: Payer: Self-pay | Admitting: Family Medicine

## 2021-09-14 ENCOUNTER — Telehealth: Payer: Self-pay | Admitting: Pharmacist

## 2021-09-14 NOTE — Progress Notes (Signed)
° ° °  Chronic Care Management Pharmacy Assistant   Name: Eduardo Young  MRN: [id number] DOB: 25-May-1956   Reason for Encounter: Patient Assistance Documentation   After several attempts at reaching [facility] patient has been approved for medications Trulicity and [employer].  Patient is aware.  [name], [facility] Pharmacist Assistant [phone]

## 2021-09-19 ENCOUNTER — Telehealth: Payer: Self-pay

## 2021-09-19 NOTE — Telephone Encounter (Signed)
Pt's sister brought in ppw to be filled out by pcp for [employer]. PPW placed in nurse's folder for completion. Please call sister Arliss Frisina when ppw available for pick up.   Cb#: [phone]

## 2021-09-20 NOTE — Telephone Encounter (Signed)
Form received. Will be completed and placed in provider's folder for signature.

## 2021-09-25 ENCOUNTER — Other Ambulatory Visit: Payer: Self-pay | Admitting: Family Medicine

## 2021-09-29 ENCOUNTER — Telehealth: Payer: Self-pay

## 2021-09-29 NOTE — Telephone Encounter (Signed)
Spoke with patient's sister, Eduardo Young [facility]), and advised form has been completed and waiting on provider to sign. There is a section that will need to be signed by the patient before it can be turned in, I have highlighted this area. Eduardo Young will come by tomorrow afternoon to pick up.  ?

## 2021-09-29 NOTE — Telephone Encounter (Signed)
Pt's sister called in inquiring about some ppw for this pt that was dropped off to be completed by pcp for [employer]. Pt's sister ask if when ppw is mailed that office please mail ppw to the address provided for [place]. Pt's sister will not be able to come and pick up ppw for the next few weeks and didn't want to delay process for getting transportation for pt. ? ?Cb#: [phone] ?

## 2021-10-01 ENCOUNTER — Other Ambulatory Visit: Payer: Self-pay | Admitting: Family Medicine

## 2021-10-17 ENCOUNTER — Telehealth: Payer: Self-pay

## 2021-10-17 NOTE — Telephone Encounter (Signed)
Pt's sister came back in to drop off ppw that needs to be fully completed by pcp. Pt's sister states there were just a few questions that pcp needed to complete. Pt placed with nurse. Please call pt when available for pick up. Pt's sister stated ppw will need to be submitted to [facility] on or before 4/7.  ? ?Cb#: [phone] ?

## 2021-10-20 NOTE — Telephone Encounter (Signed)
Form is completed. Left up front for pt to pick up. Call pt and is aware.  ?

## 2021-10-24 NOTE — Telephone Encounter (Signed)
Paperwork resubmitted for additional information. Forms completed. Patient's sister Manuela Schwartz called and is aware it's ready for pickup.  ?

## 2021-11-11 ENCOUNTER — Other Ambulatory Visit: Payer: Self-pay | Admitting: Family Medicine

## 2021-11-30 DIAGNOSIS — B351 Tinea unguium: Secondary | ICD-10-CM | POA: Diagnosis not present

## 2021-12-01 ENCOUNTER — Other Ambulatory Visit: Payer: Self-pay | Admitting: Family Medicine

## 2021-12-02 NOTE — Telephone Encounter (Signed)
Requested medication (s) are due for refill today: yes ? ?Requested medication (s) are on the active medication list: yes ? ?Last refill:  08/30/21 ? ?Future visit scheduled: no ? ?Notes to clinic:  Unable to refill per protocol, appointment needed.  ? ? ?  ?Requested Prescriptions  ?Pending Prescriptions Disp Refills  ? allopurinol (ZYLOPRIM) 100 MG tablet [Pharmacy Med Name: Allopurinol 100 MG Oral Tablet] 180 tablet 0  ?  Sig: Take 1 tablet by mouth twice daily  ?  ? Endocrinology:  Gout Agents - allopurinol Failed - 12/01/2021  6:14 PM  ?  ?  Failed - Uric Acid in normal range and within 360 days  ?  No results found for: POCURA, LABURIC   ?  ?  Failed - Cr in normal range and within 360 days  ?  Creat  ?Date Value Ref Range Status  ?02/25/2021 1.62 (H) 0.70 - 1.35 mg/dL Final  ? ?Creatinine, Urine  ?Date Value Ref Range Status  ?06/29/2015 117 20 - 370 mg/dL Final  ?   ?  ?  Passed - Valid encounter within last 12 months  ?  Recent Outpatient Visits   ? ?      ? 7 months ago Encounter for [employer] annual wellness exam  ? [facility]y Frizzle, MD  ? 9 months ago Uncontrolled type 2 diabetes mellitus with hyperglycemia ([place])  ? [facility] Pickard, Cammie Mcgee, MD  ? 1 year ago Uncontrolled type 2 diabetes mellitus with hyperglycemia ([place])  ? [facility] Pickard, Cammie Mcgee, MD  ? 1 year ago Uncontrolled type 2 diabetes mellitus with hyperglycemia ([place])  ? [facility] Pickard, Cammie Mcgee, MD  ? 1 year ago Cervical radiculopathy  ? [facility] Pickard, Cammie Mcgee, MD  ? ?  ?  ? ? ?  ?  ?  Passed - CBC within normal limits and completed in the last 12 months  ?  WBC  ?Date Value Ref Range Status  ?02/25/2021 9.5 3.8 - 10.8 Thousand/uL Final  ? ?RBC  ?Date Value Ref Range Status  ?02/25/2021 4.41 4.20 - 5.80 Million/uL Final  ? ?Hemoglobin  ?Date Value Ref Range Status  ?02/25/2021 12.3 (L) 13.2 - 17.1 g/dL Final  ? ?HCT  ?Date Value  Ref Range Status  ?02/25/2021 38.0 (L) 38.5 - 50.0 % Final  ? ?MCHC  ?Date Value Ref Range Status  ?02/25/2021 32.4 32.0 - 36.0 g/dL Final  ? ?MCH  ?Date Value Ref Range Status  ?02/25/2021 27.9 27.0 - 33.0 pg Final  ? ?MCV  ?Date Value Ref Range Status  ?02/25/2021 86.2 80.0 - 100.0 fL Final  ? ?No results found for: PLTCOUNTKUC, LABPLAT, [place] ?RDW  ?Date Value Ref Range Status  ?02/25/2021 14.5 11.0 - 15.0 % Final  ? ?  ?  ?  ? labetalol (NORMODYNE) 300 MG tablet [Pharmacy Med Name: Labetalol HCl 300 MG Oral Tablet] 180 tablet 0  ?  Sig: Take 1 tablet by mouth twice daily  ?  ? Cardiovascular:  Beta Blockers Failed - 12/01/2021  6:14 PM  ?  ?  Failed - Valid encounter within last 6 months  ?  Recent Outpatient Visits   ? ?      ? 7 months ago Encounter for [employer] annual wellness exam  ? [facility] Susy Frizzle, MD  ? 9 months ago Uncontrolled type 2 diabetes mellitus with hyperglycemia ([place])  ?  [facility] Pickard, Cammie Mcgee, MD  ? 1 year ago Uncontrolled type 2 diabetes mellitus with hyperglycemia ([place])  ? [facility] Pickard, Cammie Mcgee, MD  ? 1 year ago Uncontrolled type 2 diabetes mellitus with hyperglycemia ([place])  ? [facility] Pickard, Cammie Mcgee, MD  ? 1 year ago Cervical radiculopathy  ? [facility] Pickard, Cammie Mcgee, MD  ? ?  ?  ? ? ?  ?  ?  Passed - Last BP in normal range  ?  BP Readings from Last 1 Encounters:  ?02/25/21 128/68  ?   ?  ?  Passed - Last Heart Rate in normal range  ?  Pulse Readings from Last 1 Encounters:  ?02/25/21 90  ?   ?  ?  ? lisinopril (ZESTRIL) 20 MG tablet [Pharmacy Med Name: Lisinopril 20 MG Oral Tablet] 180 tablet 0  ?  Sig: Take 1 tablet by mouth twice daily  ?  ? Cardiovascular:  ACE Inhibitors Failed - 12/01/2021  6:14 PM  ?  ?  Failed - Cr in normal range and within 180 days  ?  Creat  ?Date Value Ref Range Status  ?02/25/2021 1.62 (H) 0.70 - 1.35 mg/dL Final  ? ?Creatinine, Urine   ?Date Value Ref Range Status  ?06/29/2015 117 20 - 370 mg/dL Final  ?   ?  ?  Failed - K in normal range and within 180 days  ?  Potassium  ?Date Value Ref Range Status  ?02/25/2021 3.4 (L) 3.5 - 5.3 mmol/L Final  ?   ?  ?  Failed - Valid encounter within last 6 months  ?  Recent Outpatient Visits   ? ?      ? 7 months ago Encounter for [employer] annual wellness exam  ? [facility] Susy Frizzle, MD  ? 9 months ago Uncontrolled type 2 diabetes mellitus with hyperglycemia ([place])  ? [facility] Pickard, Cammie Mcgee, MD  ? 1 year ago Uncontrolled type 2 diabetes mellitus with hyperglycemia ([place])  ? [facility] Pickard, Cammie Mcgee, MD  ? 1 year ago Uncontrolled type 2 diabetes mellitus with hyperglycemia ([place])  ? [facility] Pickard, Cammie Mcgee, MD  ? 1 year ago Cervical radiculopathy  ? [facility] Pickard, Cammie Mcgee, MD  ? ?  ?  ? ? ?  ?  ?  Passed - Patient is not pregnant  ?  ?  Passed - Last BP in normal range  ?  BP Readings from Last 1 Encounters:  ?02/25/21 128/68  ?   ?  ?  ? ? ?

## 2021-12-07 ENCOUNTER — Telehealth: Payer: Self-pay | Admitting: Pharmacist

## 2021-12-07 NOTE — Progress Notes (Signed)
Chronic Care Management Pharmacy Assistant   Name: Eduardo Young  MRN: [id number] DOB: May 27, 1956   Reason for Encounter: Diabetes Adherence Call    Recent office visits:  None  Recent consult visits:  None  Hospital visits:  None in previous 6 months  Medications: Outpatient Encounter Medications as of 12/07/2021  Medication Sig   ACCU-CHEK FASTCLIX LANCETS MISC Check BS BID DX - E11.9   allopurinol (ZYLOPRIM) 100 MG tablet Take 1 tablet by mouth twice daily   amLODipine (NORVASC) 10 MG tablet Take 1 tablet by mouth once daily   aspirin 81 MG tablet Take 81 mg by mouth every evening.    atorvastatin (LIPITOR) 40 MG tablet Take 1 tablet by mouth once daily   baclofen (LIORESAL) 10 MG tablet Take 0.5-1 tablets (5-10 mg total) by mouth 3 (three) times daily as needed for muscle spasms.   bisacodyl (BISACODYL) 5 MG EC tablet Take 1 tablet (5 mg total) by mouth daily as needed for moderate constipation.   Blood Glucose Monitoring Suppl (ACCU-CHEK AVIVA PLUS) w/Device KIT Check BS BID DX - E11.9   cloNIDine (CATAPRES) 0.2 MG tablet Take 1 tablet by mouth twice daily   doxazosin (CARDURA) 4 MG tablet Take 1 tablet by mouth once daily   glucose blood (ACCU-CHEK AVIVA) test strip Check BS BID DX - E11.9   hydrALAZINE (APRESOLINE) 25 MG tablet TAKE 2 TABLETS BY MOUTH THREE TIMES DAILY   hydrochlorothiazide (HYDRODIURIL) 25 MG tablet Take 1 tablet by mouth once daily   insulin glargine (LANTUS) 100 UNIT/ML Solostar Pen Inject 58 Units into the skin daily.   labetalol (NORMODYNE) 300 MG tablet Take 1 tablet by mouth twice daily   Lancets Misc. (ACCU-CHEK FASTCLIX LANCET) KIT Check BS BID DX - E11.9   lisinopril (ZESTRIL) 20 MG tablet Take 1 tablet by mouth twice daily   Multiple Vitamin (MULTIVITAMIN WITH MINERALS) TABS tablet Take 1 tablet by mouth daily.   polyethylene glycol powder (GLYCOLAX/MIRALAX) powder Take 17 g by mouth daily.   potassium chloride SA (KLOR-CON M) 20 MEQ  tablet Take 1 tablet by mouth once daily   silver sulfADIAZINE (SILVADENE) 1 % cream Apply 1 application topically daily.   traMADol (ULTRAM) 50 MG tablet Take 1 tablet (50 mg total) by mouth every 6 (six) hours as needed.   TRULICITY 9.24 QA/8.3MH SOPN INJECT 0.75 MG INTO THE SKIN ONCE A WEEK   No facility-administered encounter medications on file as of 12/07/2021.   Recent Relevant Labs: Lab Results  Component Value Date/Time   HGBA1C 5.8 (H) 02/25/2021 12:57 PM   HGBA1C >14.0 (H) 07/13/2020 02:29 PM   MICROALBUR 39.1 06/29/2015 10:43 AM   MICROALBUR 59.15 (H) 03/13/2014 11:24 AM    Kidney Function Lab Results  Component Value Date/Time   CREATININE 1.62 (H) 02/25/2021 12:57 PM   CREATININE 1.79 (H) 10/24/2020 09:45 PM   CREATININE 1.94 (H) 08/12/2020 04:49 PM   GFRNONAA 42 (L) 10/24/2020 09:45 PM   GFRNONAA 36 (L) 08/12/2020 04:49 PM   GFRAA 42 (L) 08/12/2020 04:49 PM    Current antihyperglycemic regimen:  Lantus 58 units daily Trulicity once a week  What recent interventions/DTPs have been made to improve glycemic control:  No recent interventions or DTPs.  Have there been any recent hospitalizations or ED visits since last visit with CPP? No  Patient denies hypoglycemic symptoms.  Patient denies hyperglycemic symptoms.  How often are you checking your blood sugar? Patient states he is not currently  monitoring his blood sugars at home.  What are your blood sugars ranging?  None to report  During the week, how often does your blood glucose drop below 70? Unknown, patient denies hypoglycemia symptoms.  Are you checking your feet daily/regularly? Yes  Adherence Review: Is the patient currently on a STATIN medication? Yes Is the patient currently on ACE/ARB medication? Yes Does the patient have >5 day gap between last estimated fill dates? No   Care Gaps: Medicare Annual Wellness: Completed 04/21/2021 Ophthalmology Exam: Overdue since 11/05/2020 Foot Exam:  Overdue since 07/08/2016 Hemoglobin A1C: 5.8% on 02/25/2021 Colonoscopy: Completed 09/06/2018  No future appointments.  Star Rating Drugs: Atorvastatin 40 mg last filled 09/26/2021 90 DS Lisinopril 20 mg last filled 12/03/2021 90 DS  [name], [place] Pharmacist Assistant [phone]

## 2021-12-15 ENCOUNTER — Other Ambulatory Visit: Payer: Self-pay | Admitting: Family Medicine

## 2021-12-19 NOTE — Telephone Encounter (Signed)
Requested Prescriptions  Pending Prescriptions Disp Refills  . atorvastatin (LIPITOR) 40 MG tablet [Pharmacy Med Name: Atorvastatin Calcium 40 MG Oral Tablet] 90 tablet 0    Sig: Take 1 tablet by mouth once daily     Cardiovascular:  Antilipid - Statins Failed - 12/15/2021 12:21 PM      Failed - Lipid Panel in normal range within the last 12 months    Cholesterol  Date Value Ref Range Status  02/25/2021 89 <200 mg/dL Final   LDL Cholesterol (Calc)  Date Value Ref Range Status  02/25/2021 44 mg/dL (calc) Final    Comment:    Reference range: <100 . Desirable range <100 mg/dL for primary prevention;   <70 mg/dL for patients with CHD or diabetic patients  with > or = 2 CHD risk factors. [place] Kitchen LDL-C is now calculated using the Martin-Hopkins  calculation, which is a validated novel method providing  better accuracy than the Friedewald equation in the  estimation of LDL-C.  Cresenciano Genre et al. Annamaria Helling. 6153;794(32): 2061-2068  (http://education.[employer].com/faq/FAQ164)    HDL  Date Value Ref Range Status  02/25/2021 23 (L) > OR = 40 mg/dL Final   Triglycerides  Date Value Ref Range Status  02/25/2021 135 <150 mg/dL Final         Passed - Patient is not pregnant      Passed - Valid encounter within last 12 months    Recent Outpatient Visits          8 months ago Encounter for [employer] annual wellness exam   [place] Susy Frizzle, MD   9 months ago Uncontrolled type 2 diabetes mellitus with hyperglycemia ([place])   [place] Susy Frizzle, MD   1 year ago Uncontrolled type 2 diabetes mellitus with hyperglycemia ([place])   [place] Susy Frizzle, MD   1 year ago Uncontrolled type 2 diabetes mellitus with hyperglycemia ([place])   [place] Pickard, Cammie Mcgee, MD   1 year ago Cervical radiculopathy   [place] Pickard, Cammie Mcgee, MD

## 2022-01-12 ENCOUNTER — Telehealth: Payer: Self-pay | Admitting: Pharmacist

## 2022-01-12 NOTE — Progress Notes (Unsigned)
Chronic Care Management Pharmacy Assistant   Name: Eduardo Young  MRN: [id number] DOB: [birth year]   Reason for Encounter: Diabetes Adherence Call    Recent office visits:  None since last adherence call.  Recent consult visits:  None since last adherence call.  Hospital visits:  None in previous 6 months  Medications: Outpatient Encounter Medications as of 01/12/2022  Medication Sig   ACCU-CHEK FASTCLIX LANCETS MISC Check BS BID DX - E11.9   allopurinol (ZYLOPRIM) 100 MG tablet Take 1 tablet by mouth twice daily   amLODipine (NORVASC) 10 MG tablet Take 1 tablet by mouth once daily   aspirin 81 MG tablet Take 81 mg by mouth every evening.    atorvastatin (LIPITOR) 40 MG tablet Take 1 tablet by mouth once daily   baclofen (LIORESAL) 10 MG tablet Take 0.5-1 tablets (5-10 mg total) by mouth 3 (three) times daily as needed for muscle spasms.   bisacodyl (BISACODYL) 5 MG EC tablet Take 1 tablet (5 mg total) by mouth daily as needed for moderate constipation.   Blood Glucose Monitoring Suppl (ACCU-CHEK AVIVA PLUS) w/Device KIT Check BS BID DX - E11.9   cloNIDine (CATAPRES) 0.2 MG tablet Take 1 tablet by mouth twice daily   doxazosin (CARDURA) 4 MG tablet Take 1 tablet by mouth once daily   glucose blood (ACCU-CHEK AVIVA) test strip Check BS BID DX - E11.9   hydrALAZINE (APRESOLINE) 25 MG tablet TAKE 2 TABLETS BY MOUTH THREE TIMES DAILY   hydrochlorothiazide (HYDRODIURIL) 25 MG tablet Take 1 tablet by mouth once daily   insulin glargine (LANTUS) 100 UNIT/ML Solostar Pen Inject 58 Units into the skin daily.   labetalol (NORMODYNE) 300 MG tablet Take 1 tablet by mouth twice daily   Lancets Misc. (ACCU-CHEK FASTCLIX LANCET) KIT Check BS BID DX - E11.9   lisinopril (ZESTRIL) 20 MG tablet Take 1 tablet by mouth twice daily   Multiple Vitamin (MULTIVITAMIN WITH MINERALS) TABS tablet Take 1 tablet by mouth daily.   polyethylene glycol powder (GLYCOLAX/MIRALAX) powder Take 17 g by mouth  daily.   potassium chloride SA (KLOR-CON M) 20 MEQ tablet Take 1 tablet by mouth once daily   silver sulfADIAZINE (SILVADENE) 1 % cream Apply 1 application topically daily.   traMADol (ULTRAM) 50 MG tablet Take 1 tablet (50 mg total) by mouth every 6 (six) hours as needed.   TRULICITY 1.24 PY/0.9XI SOPN INJECT 0.75 MG INTO THE SKIN ONCE A WEEK   No facility-administered encounter medications on file as of 01/12/2022.   Recent Relevant Labs: Lab Results  Component Value Date/Time   HGBA1C 5.8 (H) 02/25/2021 12:57 PM   HGBA1C >14.0 (H) 07/13/2020 02:29 PM   MICROALBUR 39.1 06/29/2015 10:43 AM   MICROALBUR 59.15 (H) 03/13/2014 11:24 AM    Kidney Function Lab Results  Component Value Date/Time   CREATININE 1.62 (H) 02/25/2021 12:57 PM   CREATININE 1.79 (H) 10/24/2020 09:45 PM   CREATININE 1.94 (H) 08/12/2020 04:49 PM   GFRNONAA 42 (L) 10/24/2020 09:45 PM   GFRNONAA 36 (L) 08/12/2020 04:49 PM   GFRAA 42 (L) 08/12/2020 04:49 PM    Current antihyperglycemic regimen:  Lantus 58 units daily Trulicity and Basaglar  What recent interventions/DTPs have been made to improve glycemic control:  ***  Have there been any recent hospitalizations or ED visits since last visit with CPP? {yes/no:20286}  Patient {reports/denies:24182} hypoglycemic symptoms, including {Hypoglycemic Symptoms:3049003}  Patient {reports/denies:24182} hyperglycemic symptoms, including {symptoms; hyperglycemia:17903}  How often are you checking  your blood sugar? {BG Testing frequency:23922}  What are your blood sugars ranging?  Fasting: *** Before meals: *** After meals: *** Bedtime: ***  During the week, how often does your blood glucose drop below 70? {LowBGfrequency:24142}  Are you checking your feet daily/regularly?   Adherence Review: Is the patient currently on a STATIN medication? {yes/no:20286} Is the patient currently on ACE/ARB medication? {yes/no:20286} Does the patient have >5 day gap between  last estimated fill dates? {yes/no:20286}   Care Gaps: Medicare Annual Wellness: Completed 04/21/2021 Ophthalmology Exam: Overdue since 11/05/2020 Foot Exam: Overdue since 07/08/2016 Hemoglobin A1C: 5.8% on 02/25/2021 Colonoscopy: Completed 09/06/2018  No future appointments.  Star Rating Drugs: Atorvastatin 40 mg last filled 12/19/2021 90 DS Lisinopril 20 mg last filled 12/03/2021 90 DS  [name], [place] Pharmacist Assistant [phone]

## 2022-01-25 ENCOUNTER — Other Ambulatory Visit: Payer: Self-pay | Admitting: Family Medicine

## 2022-01-25 NOTE — Telephone Encounter (Signed)
LOV 05/14/21 Last refill 07/29/21, #30, 0 refills  Please review, thanks!

## 2022-01-30 ENCOUNTER — Other Ambulatory Visit: Payer: Self-pay | Admitting: Family Medicine

## 2022-02-02 ENCOUNTER — Other Ambulatory Visit: Payer: Self-pay | Admitting: Family Medicine

## 2022-02-02 DIAGNOSIS — E1151 Type 2 diabetes mellitus with diabetic peripheral angiopathy without gangrene: Secondary | ICD-10-CM | POA: Diagnosis not present

## 2022-02-02 NOTE — Telephone Encounter (Signed)
Requested Prescriptions  Pending Prescriptions Disp Refills  . hydrochlorothiazide (HYDRODIURIL) 25 MG tablet [Pharmacy Med Name: hydroCHLOROthiazide 25 MG Oral Tablet] 90 tablet 0    Sig: Take 1 tablet (25 mg total) by mouth daily. OFFICE VISIT NEEDED FOR ADDITIONAL REFILLS     Cardiovascular: Diuretics - Thiazide Failed - 02/02/2022 12:41 PM      Failed - Cr in normal range and within 180 days    Creat  Date Value Ref Range Status  02/25/2021 1.62 (H) 0.70 - 1.35 mg/dL Final   Creatinine, Urine  Date Value Ref Range Status  06/29/2015 117 20 - 370 mg/dL Final         Failed - K in normal range and within 180 days    Potassium  Date Value Ref Range Status  02/25/2021 3.4 (L) 3.5 - 5.3 mmol/L Final         Failed - Na in normal range and within 180 days    Sodium  Date Value Ref Range Status  02/25/2021 144 135 - 146 mmol/L Final         Failed - Valid encounter within last 6 months    Recent Outpatient Visits          9 months ago Encounter for Medicare annual wellness exam   [place] Susy Frizzle, MD   11 months ago Uncontrolled type 2 diabetes mellitus with hyperglycemia ([place])   [place] Susy Frizzle, MD   1 year ago Uncontrolled type 2 diabetes mellitus with hyperglycemia ([place])   [place] Susy Frizzle, MD   1 year ago Uncontrolled type 2 diabetes mellitus with hyperglycemia ([place])   [place] Pickard, Cammie Mcgee, MD   1 year ago Cervical radiculopathy   [place], Cammie Mcgee, MD             Passed - Last BP in normal range    BP Readings from Last 1 Encounters:  02/25/21 128/68

## 2022-02-02 NOTE — Telephone Encounter (Signed)
Patient called on cell number, left VM to return the call to the office to schedule physical that is coming due in August. Last CPE 02/25/21.

## 2022-03-11 ENCOUNTER — Other Ambulatory Visit: Payer: Self-pay | Admitting: Family Medicine

## 2022-03-14 ENCOUNTER — Other Ambulatory Visit: Payer: Self-pay

## 2022-03-14 NOTE — Telephone Encounter (Signed)
Pharmacy faxed a refill request for atorvastatin (LIPITOR) 40 MG tablet [[id number]]    Order Details Dose, Route, Frequency: As Directed  Dispense Quantity: 90 tablet Refills: 0        Sig: Take 1 tablet by mouth once daily       Start Date: 12/19/21 End Date: --  Written Date: 12/19/21 Expiration Date: 12/19/22

## 2022-03-15 ENCOUNTER — Other Ambulatory Visit: Payer: Self-pay

## 2022-03-15 ENCOUNTER — Other Ambulatory Visit: Payer: Self-pay | Admitting: Family Medicine

## 2022-03-15 MED ORDER — ATORVASTATIN CALCIUM 40 MG PO TABS: 40.0000 mg | ORAL_TABLET | Freq: Every day | ORAL | 0 refills | Status: DC

## 2022-03-15 NOTE — Telephone Encounter (Signed)
Courtesy refill. Future appt in 1 month. Requested Prescriptions  Pending Prescriptions Disp Refills  . atorvastatin (LIPITOR) 40 MG tablet 90 tablet 0    Sig: Take 1 tablet (40 mg total) by mouth daily.     Cardiovascular:  Antilipid - Statins Failed - 03/14/2022  4:40 PM      Failed - Valid encounter within last 12 months    Recent Outpatient Visits          11 months ago Encounter for Medicare annual wellness exam   [place] Susy Frizzle, MD   1 year ago Uncontrolled type 2 diabetes mellitus with hyperglycemia ([place])   [place] Susy Frizzle, MD   1 year ago Uncontrolled type 2 diabetes mellitus with hyperglycemia ([place])   [place] Susy Frizzle, MD   1 year ago Uncontrolled type 2 diabetes mellitus with hyperglycemia ([place])   [place] Pickard, Cammie Mcgee, MD   1 year ago Cervical radiculopathy   [place] Pickard, Cammie Mcgee, MD      Future Appointments            In 1 month Pickard, Cammie Mcgee, MD [place]           Failed - Lipid Panel in normal range within the last 12 months    Cholesterol  Date Value Ref Range Status  02/25/2021 89 <200 mg/dL Final   LDL Cholesterol (Calc)  Date Value Ref Range Status  02/25/2021 44 mg/dL (calc) Final    Comment:    Reference range: <100 . Desirable range <100 mg/dL for primary prevention;   <70 mg/dL for patients with CHD or diabetic patients  with > or = 2 CHD risk factors. [place] Kitchen LDL-C is now calculated using the Martin-Hopkins  calculation, which is a validated novel method providing  better accuracy than the Friedewald equation in the  estimation of LDL-C.  Cresenciano Genre et al. Annamaria Helling. 6812;751(70): 2061-2068  (http://education.[employer].com/faq/FAQ164)    HDL  Date Value Ref Range Status  02/25/2021 23 (L) > OR = 40 mg/dL Final   Triglycerides  Date Value Ref Range Status  02/25/2021 135  <150 mg/dL Final         Passed - Patient is not pregnant

## 2022-03-28 ENCOUNTER — Other Ambulatory Visit: Payer: Self-pay | Admitting: Family Medicine

## 2022-03-28 ENCOUNTER — Telehealth: Payer: Self-pay

## 2022-03-28 MED ORDER — SILVER SULFADIAZINE 1 % EX CREA
1.0000 | TOPICAL_CREAM | Freq: Every day | CUTANEOUS | 0 refills | Status: DC
Start: 2022-03-28 — End: 2024-04-25

## 2022-03-28 NOTE — Telephone Encounter (Signed)
Pt's sister, Manuela Schwartz, called stating pt is having issues with a "sore" in fold of abdomen, near hip again and would like Rx for Silavdene Cream sent to [facility] [address], if possible. Pt has annual exam scheduled for 04/17/2022. Thanks.

## 2022-04-14 ENCOUNTER — Other Ambulatory Visit: Payer: Self-pay | Admitting: Family Medicine

## 2022-04-14 NOTE — Telephone Encounter (Signed)
Requested medications are due for refill today.  yes  Requested medications are on the active medications list.  yes  Last refill. 03/15/2022 #30 0 refills  Future visit scheduled.   yes  Notes to clinic.  Labs are expired.    Requested Prescriptions  Pending Prescriptions Disp Refills   potassium chloride SA (KLOR-CON M) 20 MEQ tablet [Pharmacy Med Name: Potassium Chloride Crys ER 20 MEQ Oral Tablet Extended Release] 30 tablet 0    Sig: Take 1 tablet by mouth once daily     Endocrinology:  Minerals - Potassium Supplementation Failed - 04/14/2022  5:43 PM      Failed - K in normal range and within 360 days    Potassium  Date Value Ref Range Status  02/25/2021 3.4 (L) 3.5 - 5.3 mmol/L Final         Failed - Cr in normal range and within 360 days    Creat  Date Value Ref Range Status  02/25/2021 1.62 (H) 0.70 - 1.35 mg/dL Final   Creatinine, Urine  Date Value Ref Range Status  06/29/2015 117 20 - 370 mg/dL Final         Failed - Valid encounter within last 12 months    Recent Outpatient Visits           12 months ago Encounter for Medicare annual wellness exam   [place] Susy Frizzle, MD   1 year ago Uncontrolled type 2 diabetes mellitus with hyperglycemia ([place])   [place] Susy Frizzle, MD   1 year ago Uncontrolled type 2 diabetes mellitus with hyperglycemia ([place])   [place] Susy Frizzle, MD   1 year ago Uncontrolled type 2 diabetes mellitus with hyperglycemia ([place])   [place] Pickard, Cammie Mcgee, MD   1 year ago Cervical radiculopathy   [place], Cammie Mcgee, MD       Future Appointments             In 3 days Pickard, Cammie Mcgee, MD [place]

## 2022-04-17 ENCOUNTER — Telehealth: Payer: Self-pay

## 2022-04-17 ENCOUNTER — Ambulatory Visit (INDEPENDENT_AMBULATORY_CARE_PROVIDER_SITE_OTHER): Payer: Medicare Other | Admitting: Family Medicine

## 2022-04-17 VITALS — BP 132/72 | HR 69 | Temp 97.5°F

## 2022-04-17 DIAGNOSIS — Z23 Encounter for immunization: Secondary | ICD-10-CM

## 2022-04-17 DIAGNOSIS — I69351 Hemiplegia and hemiparesis following cerebral infarction affecting right dominant side: Secondary | ICD-10-CM | POA: Diagnosis not present

## 2022-04-17 DIAGNOSIS — E1122 Type 2 diabetes mellitus with diabetic chronic kidney disease: Secondary | ICD-10-CM | POA: Diagnosis not present

## 2022-04-17 DIAGNOSIS — Z0000 Encounter for general adult medical examination without abnormal findings: Secondary | ICD-10-CM

## 2022-04-17 DIAGNOSIS — Z794 Long term (current) use of insulin: Secondary | ICD-10-CM

## 2022-04-17 DIAGNOSIS — I10 Essential (primary) hypertension: Secondary | ICD-10-CM

## 2022-04-17 DIAGNOSIS — N1831 Chronic kidney disease, stage 3a: Secondary | ICD-10-CM
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
## 2022-04-17 NOTE — Telephone Encounter (Signed)
PT NEEDS PHYSICAL W/PCP FOR FUTURE REFILLS

## 2022-04-19 LAB — COMPLETE METABOLIC PANEL WITH GFR
AG Ratio: 1.5 (calc) (ref 1.0–2.5)
ALT: 14 U/L (ref 9–46)
AST: 15 U/L (ref 10–35)
Albumin: 3.8 g/dL (ref 3.6–5.1)
Alkaline phosphatase (APISO): 96 U/L (ref 35–144)
BUN/Creatinine Ratio: 13 (calc) (ref 6–22)
BUN: 22 mg/dL (ref 7–25)
CO2: 29 mmol/L (ref 20–32)
Calcium: 8.9 mg/dL (ref 8.6–10.3)
Chloride: 105 mmol/L (ref 98–110)
Creat: 1.69 mg/dL — ABNORMAL HIGH (ref 0.70–1.35)
Globulin: 2.5 g/dL (calc) (ref 1.9–3.7)
Glucose, Bld: 153 mg/dL — ABNORMAL HIGH (ref 65–99)
Potassium: 4 mmol/L (ref 3.5–5.3)
Sodium: 144 mmol/L (ref 135–146)
Total Bilirubin: 0.4 mg/dL (ref 0.2–1.2)
Total Protein: 6.3 g/dL (ref 6.1–8.1)
eGFR: 45 mL/min/{1.73_m2} — ABNORMAL LOW (ref >=60)

## 2022-04-19 LAB — CBC WITH DIFFERENTIAL/PLATELET
Absolute Monocytes: 610 cells/uL (ref 200–950)
Basophils Absolute: 50 cells/uL (ref 0–200)
Basophils Relative: 0.5 %
Eosinophils Absolute: 750 cells/uL — ABNORMAL HIGH (ref 15–500)
Eosinophils Relative: 7.5 %
HCT: 35.4 % — ABNORMAL LOW (ref 38.5–50.0)
Hemoglobin: 12 g/dL — ABNORMAL LOW (ref 13.2–17.1)
Lymphs Abs: 1770 cells/uL (ref 850–3900)
MCH: 29.4 pg (ref 27.0–33.0)
MCHC: 33.9 g/dL (ref 32.0–36.0)
MCV: 86.8 fL (ref 80.0–100.0)
MPV: 10.1 fL (ref 7.5–12.5)
Monocytes Relative: 6.1 %
Neutro Abs: 6820 cells/uL (ref 1500–7800)
Neutrophils Relative %: 68.2 %
Platelets: 226 10*3/uL (ref 140–400)
RBC: 4.08 10*6/uL — ABNORMAL LOW (ref 4.20–5.80)
RDW: 14.7 % (ref 11.0–15.0)
Total Lymphocyte: 17.7 %
WBC: 10 10*3/uL (ref 3.8–10.8)

## 2022-04-19 LAB — LIPID PANEL
Cholesterol: 107 mg/dL (ref <200)
HDL: 26 mg/dL — ABNORMAL LOW (ref >=40)
LDL Cholesterol (Calc): 54 mg/dL (calc)
Non-HDL Cholesterol (Calc): 81 mg/dL (calc) (ref <130)
Total CHOL/HDL Ratio: 4.1 (calc) (ref <5.0)
Triglycerides: 195 mg/dL — ABNORMAL HIGH (ref <150)

## 2022-04-19 LAB — HEMOGLOBIN A1C
Hgb A1c MFr Bld: 6.6 % of total Hgb — ABNORMAL HIGH (ref <5.7)
Mean Plasma Glucose: 143 mg/dL
eAG (mmol/L): 7.9 mmol/L

## 2022-04-19 LAB — MICROALBUMIN, URINE

## 2022-04-21 ENCOUNTER — Other Ambulatory Visit: Payer: Self-pay | Admitting: Family Medicine

## 2022-04-21 MED ORDER — FREESTYLE LIBRE 2 READER DEVI: 1.0000 | Freq: Every day | 1 refills | Status: DC

## 2022-04-21 MED ORDER — FREESTYLE LIBRE 2 SENSOR MISC: 1.0000 | 11 refills | Status: DC

## 2022-04-21 NOTE — Telephone Encounter (Signed)
Refilled 05/30/2021  - 1 year supply. Requested Prescriptions  Pending Prescriptions Disp Refills  . cloNIDine (CATAPRES) 0.2 MG tablet [Pharmacy Med Name: cloNIDine HCl 0.2 MG Oral Tablet] 180 tablet 0    Sig: Take 1 tablet by mouth twice daily     Cardiovascular:  Alpha-2 Agonists Failed - 04/21/2022  2:11 PM      Failed - Valid encounter within last 6 months    Recent Outpatient Visits          1 year ago Encounter for Medicare annual wellness exam   [place] Susy Frizzle, MD   1 year ago Uncontrolled type 2 diabetes mellitus with hyperglycemia ([place])   [place] Susy Frizzle, MD   1 year ago Uncontrolled type 2 diabetes mellitus with hyperglycemia ([place])   [place] Susy Frizzle, MD   1 year ago Uncontrolled type 2 diabetes mellitus with hyperglycemia ([place])   [place] Pickard, Cammie Mcgee, MD   1 year ago Cervical radiculopathy   [place], Cammie Mcgee, MD             Passed - Last BP in normal range    BP Readings from Last 1 Encounters:  04/17/22 132/72         Passed - Last Heart Rate in normal range    Pulse Readings from Last 1 Encounters:  04/17/22 69

## 2022-04-24 ENCOUNTER — Other Ambulatory Visit: Payer: Self-pay | Admitting: Family Medicine

## 2022-04-25 ENCOUNTER — Other Ambulatory Visit: Payer: Self-pay

## 2022-04-25 ENCOUNTER — Telehealth: Payer: Self-pay

## 2022-04-25 DIAGNOSIS — I10 Essential (primary) hypertension: Secondary | ICD-10-CM

## 2022-04-25 DIAGNOSIS — G8111 Spastic hemiplegia affecting right dominant side: Secondary | ICD-10-CM

## 2022-04-25 DIAGNOSIS — N1831 Chronic kidney disease, stage 3a: Secondary | ICD-10-CM

## 2022-04-25 MED ORDER — CLONIDINE HCL 0.2 MG PO TABS: 0.2000 mg | ORAL_TABLET | Freq: Two times a day (BID) | ORAL | 3 refills | Status: DC

## 2022-04-25 NOTE — Telephone Encounter (Signed)
Call from pt's sister, Manuela Schwartz, who states pt's Clonidine was denied? I didn't see where it was denied. Are you okay with refilling?

## 2022-05-02 NOTE — Telephone Encounter (Signed)
Patient was seen on 04/17/22

## 2022-05-04 DIAGNOSIS — B351 Tinea unguium: Secondary | ICD-10-CM | POA: Diagnosis not present

## 2022-05-13 ENCOUNTER — Other Ambulatory Visit: Payer: Self-pay | Admitting: Family Medicine

## 2022-05-13 DIAGNOSIS — I10 Essential (primary) hypertension: Secondary | ICD-10-CM

## 2022-05-18 ENCOUNTER — Other Ambulatory Visit: Payer: Self-pay | Admitting: Family Medicine

## 2022-05-19 NOTE — Telephone Encounter (Signed)
Unable to refill per protocol, medication was refilled 05/15/22 for 30, until pt can make OV. Will refuse duplicate request.  Requested Prescriptions  Pending Prescriptions Disp Refills  . potassium chloride SA (KLOR-CON M) 20 MEQ tablet [Pharmacy Med Name: Potassium Chloride Crys ER 20 MEQ Oral Tablet Extended Release] 30 tablet 0    Sig: TAKE 1  TABLET BY MOUTH ONCE DAILY ---PT  NEEDS  PHYSICAL  WITH  PRIMARY  CARE  PHYSICIAN  FOR  FUTURE  REFILLS     Endocrinology:  Minerals - Potassium Supplementation Failed - 05/18/2022 12:21 PM      Failed - Cr in normal range and within 360 days    Creat  Date Value Ref Range Status  04/17/2022 1.69 (H) 0.70 - 1.35 mg/dL Final   Creatinine, Urine  Date Value Ref Range Status  06/29/2015 117 20 - 370 mg/dL Final         Failed - Valid encounter within last 12 months    Recent Outpatient Visits          1 year ago Encounter for Medicare annual wellness exam   [place] Susy Frizzle, MD   1 year ago Uncontrolled type 2 diabetes mellitus with hyperglycemia ([place])   [place] Susy Frizzle, MD   1 year ago Uncontrolled type 2 diabetes mellitus with hyperglycemia ([place])   [place] Susy Frizzle, MD   1 year ago Uncontrolled type 2 diabetes mellitus with hyperglycemia ([place])   [facility] Pickard, Cammie Mcgee, MD   1 year ago Cervical radiculopathy   [place], Warren T, MD             Passed - K in normal range and within 360 days    Potassium  Date Value Ref Range Status  04/17/2022 4.0 3.5 - 5.3 mmol/L Final

## 2022-05-23 ENCOUNTER — Telehealth: Payer: Self-pay

## 2022-05-23 NOTE — Telephone Encounter (Signed)
Tried to call pt re PA for [facility], reader---no answer, LVM  Need more info before PA is sent to plan

## 2022-05-24 ENCOUNTER — Telehealth: Payer: Self-pay

## 2022-05-24 NOTE — Telephone Encounter (Signed)
Eduardo Young  Pt's sister, Manuela Schwartz, called asking if you could please print out pt's re-enrollment for [facility], please. Pt had rec' a renewal notice in the mail. If you have any questions pls call pt sister at [phone]   Thank you.

## 2022-05-24 NOTE — Telephone Encounter (Signed)
Per pt's sister, Manuela Schwartz, this will be the first time pt is using a [employer] 2 Reader. Per sister, she said that pt will not be able to afford reader, per pt sister stated that she had talked to a friend that works in Insurance underwriter and will be able to get a Reader w/o any cost for pt.   Told sister to call her contact person and have her send/fax the work that is needed so pt can get this Reader at no cost to him for Dr. Dennard Schaumann to fill out.   Sister voiced understanding and nothing further.

## 2022-05-25 NOTE — Telephone Encounter (Signed)
Forms printed for both medications renewal and mailed to patient home.  Please sign and return with proof of income and all other required documents.  Thanks!!

## 2022-05-25 NOTE — Telephone Encounter (Signed)
Taken care off per [place], [facility]

## 2022-06-03 ENCOUNTER — Other Ambulatory Visit: Payer: Self-pay | Admitting: Family Medicine

## 2022-06-05 NOTE — Telephone Encounter (Signed)
Requested Prescriptions  Pending Prescriptions Disp Refills   Dulaglutide (TRULICITY) 0.26 VZ/8.5YI SOPN [Pharmacy Med Name: Trulicity Subcutaneous Solution Pen-injector 0.75 MG/0.5ML] 8 mL 0    Sig: Inject 0.75 mg into the skin once a week. OFFICE VISIT NEEDED FOR ADDITIONAL REFILLS     Endocrinology:  Diabetes - GLP-1 Receptor Agonists Failed - 06/03/2022  3:09 PM      Failed - Valid encounter within last 6 months    Recent Outpatient Visits           1 year ago Encounter for Medicare annual wellness exam   [place] Susy Frizzle, MD   1 year ago Uncontrolled type 2 diabetes mellitus with hyperglycemia ([place])   [place] Pickard, Cammie Mcgee, MD   1 year ago Uncontrolled type 2 diabetes mellitus with hyperglycemia ([place])   [place] Pickard, Cammie Mcgee, MD   1 year ago Uncontrolled type 2 diabetes mellitus with hyperglycemia ([place])   [place] Pickard, Cammie Mcgee, MD   1 year ago Cervical radiculopathy   [place] Pickard, Cammie Mcgee, MD              Passed - HBA1C is between 0 and 7.9 and within 180 days    Hgb A1c MFr Bld  Date Value Ref Range Status  04/17/2022 6.6 (H) <5.7 % of total Hgb Final    Comment:    For someone without known diabetes, a hemoglobin A1c value of 6.5% or greater indicates that they may have  diabetes and this should be confirmed with a follow-up  test. . For someone with known diabetes, a value <7% indicates  that their diabetes is well controlled and a value  greater than or equal to 7% indicates suboptimal  control. A1c targets should be individualized based on  duration of diabetes, age, comorbid conditions, and  other considerations. . Currently, no consensus exists regarding use of hemoglobin A1c for diagnosis of diabetes for children. [place] Kitchen

## 2022-06-05 NOTE — Telephone Encounter (Signed)
Requested Prescriptions  Pending Prescriptions Disp Refills   amLODipine (NORVASC) 10 MG tablet [Pharmacy Med Name: amLODIPine Besylate 10 MG Oral Tablet] 90 tablet 1    Sig: Take 1 tablet by mouth once daily     Cardiovascular: Calcium Channel Blockers 2 Failed - 06/03/2022  8:42 PM      Failed - Valid encounter within last 6 months    Recent Outpatient Visits           1 year ago Encounter for Medicare annual wellness exam   [place] Susy Frizzle, MD   1 year ago Uncontrolled type 2 diabetes mellitus with hyperglycemia ([place])   [place] Susy Frizzle, MD   1 year ago Uncontrolled type 2 diabetes mellitus with hyperglycemia ([place])   [place] Susy Frizzle, MD   1 year ago Uncontrolled type 2 diabetes mellitus with hyperglycemia ([place])   [place] Pickard, Cammie Mcgee, MD   1 year ago Cervical radiculopathy   [place], Cammie Mcgee, MD              Passed - Last BP in normal range    BP Readings from Last 1 Encounters:  04/17/22 132/72         Passed - Last Heart Rate in normal range    Pulse Readings from Last 1 Encounters:  04/17/22 69

## 2022-06-07 DIAGNOSIS — E1122 Type 2 diabetes mellitus with diabetic chronic kidney disease: Secondary | ICD-10-CM | POA: Diagnosis not present

## 2022-06-10 ENCOUNTER — Other Ambulatory Visit: Payer: Self-pay | Admitting: Family Medicine

## 2022-06-10 DIAGNOSIS — I10 Essential (primary) hypertension: Secondary | ICD-10-CM

## 2022-06-16 ENCOUNTER — Other Ambulatory Visit: Payer: Self-pay | Admitting: Family Medicine

## 2022-06-17 ENCOUNTER — Other Ambulatory Visit: Payer: Self-pay | Admitting: Family Medicine

## 2022-06-19 ENCOUNTER — Other Ambulatory Visit: Payer: Self-pay | Admitting: Family Medicine

## 2022-06-19 NOTE — Telephone Encounter (Signed)
Requested Prescriptions  Pending Prescriptions Disp Refills   KLOR-CON M20 20 MEQ tablet [Pharmacy Med Name: Klor-Con M20 20 MEQ Oral Tablet Extended Release] 90 tablet 3    Sig: TAKE 1  TABLET BY MOUTH ONCE DAILY ---PT  NEEDS  PHYSICAL  WITH  PRIMARY  CARE  PHYSICIAN  FOR  FUTURE  REFILLS     Endocrinology:  Minerals - Potassium Supplementation Failed - 06/16/2022  9:07 AM      Failed - Cr in normal range and within 360 days    Creat  Date Value Ref Range Status  04/17/2022 1.69 (H) 0.70 - 1.35 mg/dL Final   Creatinine, Urine  Date Value Ref Range Status  06/29/2015 117 20 - 370 mg/dL Final         Failed - Valid encounter within last 12 months    Recent Outpatient Visits           1 year ago Encounter for Medicare annual wellness exam   [place] Susy Frizzle, MD   1 year ago Uncontrolled type 2 diabetes mellitus with hyperglycemia ([place])   [place] Susy Frizzle, MD   1 year ago Uncontrolled type 2 diabetes mellitus with hyperglycemia ([place])   [place] Susy Frizzle, MD   1 year ago Uncontrolled type 2 diabetes mellitus with hyperglycemia ([place])   [facility] Pickard, Cammie Mcgee, MD   2 years ago Cervical radiculopathy   [place], Cammie Mcgee, MD              Passed - K in normal range and within 360 days    Potassium  Date Value Ref Range Status  04/17/2022 4.0 3.5 - 5.3 mmol/L Final

## 2022-07-06 ENCOUNTER — Other Ambulatory Visit: Payer: Self-pay | Admitting: Family Medicine

## 2022-07-06 DIAGNOSIS — I10 Essential (primary) hypertension: Secondary | ICD-10-CM

## 2022-07-06 NOTE — Telephone Encounter (Signed)
Requested Prescriptions  Pending Prescriptions Disp Refills   hydrALAZINE (APRESOLINE) 25 MG tablet [Pharmacy Med Name: hydrALAZINE HCl 25 MG Oral Tablet] 180 tablet 2    Sig: TAKE 2 TABLETS BY MOUTH THREE TIMES DAILY     Cardiovascular:  Vasodilators Failed - 07/06/2022  9:08 AM      Failed - HCT in normal range and within 360 days    HCT  Date Value Ref Range Status  04/17/2022 35.4 (L) 38.5 - 50.0 % Final         Failed - HGB in normal range and within 360 days    Hemoglobin  Date Value Ref Range Status  04/17/2022 12.0 (L) 13.2 - 17.1 g/dL Final         Failed - RBC in normal range and within 360 days    RBC  Date Value Ref Range Status  04/17/2022 4.08 (L) 4.20 - 5.80 Million/uL Final         Failed - ANA Screen, Ifa, Serum in normal range and within 360 days    No results found for: "ANA", "ANATITER", "LABANTI"       Failed - Valid encounter within last 12 months    Recent Outpatient Visits           1 year ago Encounter for Medicare annual wellness exam   [place] Susy Frizzle, MD   1 year ago Uncontrolled type 2 diabetes mellitus with hyperglycemia ([place])   [place] Susy Frizzle, MD   1 year ago Uncontrolled type 2 diabetes mellitus with hyperglycemia ([place])   [place] Susy Frizzle, MD   1 year ago Uncontrolled type 2 diabetes mellitus with hyperglycemia ([place])   [place] Susy Frizzle, MD   2 years ago Cervical radiculopathy   [place] Pickard, Cammie Mcgee, MD              Passed - WBC in normal range and within 360 days    WBC  Date Value Ref Range Status  04/17/2022 10.0 3.8 - 10.8 Thousand/uL Final         Passed - PLT in normal range and within 360 days    Platelets  Date Value Ref Range Status  04/17/2022 226 140 - 400 Thousand/uL Final         Passed - Last BP in normal range    BP Readings from Last 1 Encounters:  04/17/22  132/72

## 2022-08-07 DIAGNOSIS — B351 Tinea unguium: Secondary | ICD-10-CM | POA: Diagnosis not present

## 2022-09-06 ENCOUNTER — Other Ambulatory Visit: Payer: Self-pay | Admitting: Family Medicine

## 2022-09-23 ENCOUNTER — Other Ambulatory Visit: Payer: Self-pay | Admitting: Family Medicine

## 2022-09-27 LAB — HM DIABETES EYE EXAM

## 2022-10-06 ENCOUNTER — Telehealth: Payer: Self-pay

## 2022-10-06 NOTE — Telephone Encounter (Signed)
Pt's sister brought in pt's handicap placard form to be completed by pcp. Please call pt's sister when form is available to be picked up. Intake form and handicap placard form placed in nurse's folder.   Cb#: [phone]

## 2022-10-14 ENCOUNTER — Other Ambulatory Visit: Payer: Self-pay | Admitting: Family Medicine

## 2022-10-14 DIAGNOSIS — I10 Essential (primary) hypertension: Secondary | ICD-10-CM

## 2022-10-16 NOTE — Telephone Encounter (Signed)
Requested Prescriptions  Pending Prescriptions Disp Refills   hydrALAZINE (APRESOLINE) 25 MG tablet [Pharmacy Med Name: hydrALAZINE HCl 25 MG Oral Tablet] 180 tablet 2    Sig: TAKE 2 TABLETS BY MOUTH THREE TIMES DAILY     Cardiovascular:  Vasodilators Failed - 10/14/2022 12:05 PM      Failed - HCT in normal range and within 360 days    HCT  Date Value Ref Range Status  04/17/2022 35.4 (L) 38.5 - 50.0 % Final         Failed - HGB in normal range and within 360 days    Hemoglobin  Date Value Ref Range Status  04/17/2022 12.0 (L) 13.2 - 17.1 g/dL Final         Failed - RBC in normal range and within 360 days    RBC  Date Value Ref Range Status  04/17/2022 4.08 (L) 4.20 - 5.80 Million/uL Final         Failed - ANA Screen, Ifa, Serum in normal range and within 360 days    No results found for: "ANA", "ANATITER", "LABANTI"       Failed - Valid encounter within last 12 months    Recent Outpatient Visits           1 year ago Encounter for Medicare annual wellness exam   [place] Susy Frizzle, MD   1 year ago Uncontrolled type 2 diabetes mellitus with hyperglycemia ([place])   [place] Susy Frizzle, MD   2 years ago Uncontrolled type 2 diabetes mellitus with hyperglycemia ([place])   [place] Susy Frizzle, MD   2 years ago Uncontrolled type 2 diabetes mellitus with hyperglycemia ([place])   [place] Susy Frizzle, MD   2 years ago Cervical radiculopathy   [place] Pickard, Cammie Mcgee, MD              Passed - WBC in normal range and within 360 days    WBC  Date Value Ref Range Status  04/17/2022 10.0 3.8 - 10.8 Thousand/uL Final         Passed - PLT in normal range and within 360 days    Platelets  Date Value Ref Range Status  04/17/2022 226 140 - 400 Thousand/uL Final         Passed - Last BP in normal range    BP Readings from Last 1 Encounters:  04/17/22  132/72

## 2022-11-04 ENCOUNTER — Other Ambulatory Visit: Payer: Self-pay | Admitting: Family Medicine

## 2022-11-06 DIAGNOSIS — B351 Tinea unguium: Secondary | ICD-10-CM | POA: Diagnosis not present

## 2022-11-06 NOTE — Telephone Encounter (Signed)
Unable to refill per protocol, Rx request is too soon. Last refill 06/05/22 for 90 and 1 refill. Patient needs OV for additional refills.  Requested Prescriptions  Pending Prescriptions Disp Refills   amLODipine (NORVASC) 10 MG tablet [Pharmacy Med Name: amLODIPine Besylate 10 MG Oral Tablet] 90 tablet 0    Sig: Take 1 tablet by mouth once daily     Cardiovascular: Calcium Channel Blockers 2 Failed - 11/04/2022  1:05 PM      Failed - Valid encounter within last 6 months    Recent Outpatient Visits           1 year ago Encounter for Medicare annual wellness exam   [facility] Donita Brooks, MD   1 year ago Uncontrolled type 2 diabetes mellitus with hyperglycemia (HCC)   [facility] Donita Brooks, MD   2 years ago Uncontrolled type 2 diabetes mellitus with hyperglycemia (HCC)   [facility] Donita Brooks, MD   2 years ago Uncontrolled type 2 diabetes mellitus with hyperglycemia (HCC)   [facility] Pickard, Priscille Heidelberg, MD   2 years ago Cervical radiculopathy   [facility] Pickard, Priscille Heidelberg, MD              Passed - Last BP in normal range    BP Readings from Last 1 Encounters:  04/17/22 132/72         Passed - Last Heart Rate in normal range    Pulse Readings from Last 1 Encounters:  04/17/22 69

## 2022-11-13 ENCOUNTER — Ambulatory Visit: Payer: Medicare Other | Admitting: Family Medicine

## 2022-11-14 ENCOUNTER — Encounter: Payer: Self-pay | Admitting: Family Medicine

## 2022-11-14 ENCOUNTER — Ambulatory Visit (INDEPENDENT_AMBULATORY_CARE_PROVIDER_SITE_OTHER): Payer: Medicare Other | Admitting: Family Medicine

## 2022-11-14 VITALS — BP 120/72 | HR 62 | Temp 97.5°F | Ht 76.0 in | Wt 288.0 lb

## 2022-11-14 DIAGNOSIS — E1122 Type 2 diabetes mellitus with diabetic chronic kidney disease: Secondary | ICD-10-CM | POA: Diagnosis not present

## 2022-11-14 DIAGNOSIS — Z794 Long term (current) use of insulin: Secondary | ICD-10-CM

## 2022-11-14 DIAGNOSIS — Z23 Encounter for immunization: Secondary | ICD-10-CM | POA: Diagnosis not present

## 2022-11-14 DIAGNOSIS — N1831 Chronic kidney disease, stage 3a: Secondary | ICD-10-CM | POA: Diagnosis not present

## 2022-11-14 NOTE — Progress Notes (Signed)
Subjective:    Patient ID: Eduardo Young, male    DOB: [birth year], 67 y.o.   MRN: [id number] Patient is here today for checkup.  He has a history of insulin-dependent diabetes mellitus.  He is currently on 58 units of Basaglar once daily.  He takes this at night prior to going to bed.  He is also not checking his sugars.  I prescribed for the patient continuous blood glucose monitoring.  He has the equipment at home but he is just not using it.  I explained the risk of hypoglycemia.  I do not want him taking insulin and going to bed because on concern if he has a hypoglycemic episode he will be asleep.  However the patient simply does not check his sugars.  He denies any symptoms of hypoglycemia.  He denies any chest pain shortness of breath or dyspnea on exertion pressure today is excellent.  He is on numerous antihypertensive medications including labetalol, hydralazine, clonidine, doxazosin, lisinopril.  He is due to recheck renal function.  However his blood pressures are excellent. Past Medical History:  Diagnosis Date   Chronic venous insufficiency    Diabetes mellitus    GERD (gastroesophageal reflux disease)    Gout    Hemiparesis    Hyperlipidemia    Hypertension    Obesity    Renal calculi    Sleep apnea, obstructive    Stroke    [id number]   Past Surgical History:  Procedure Laterality Date   BIOPSY  09/06/2018   Procedure: BIOPSY;  Surgeon: Beverley Fiedler, MD;  Location: [facility];  Service: Gastroenterology;;   COLONOSCOPY WITH PROPOFOL N/A 09/06/2018   Procedure: COLONOSCOPY WITH PROPOFOL;  Surgeon: Beverley Fiedler, MD;  Location: [facility];  Service: Gastroenterology;  Laterality: N/A;   LITHOTRIPSY     NASAL POLYP EXCISION     Current Outpatient Medications on File Prior to Visit  Medication Sig Dispense Refill   ACCU-CHEK FASTCLIX LANCETS MISC Check BS BID DX - E11.9 200 each 3   allopurinol (ZYLOPRIM) 100 MG tablet Take 1 tablet by mouth twice daily 180 tablet 0    amLODipine (NORVASC) 10 MG tablet Take 1 tablet by mouth once daily 90 tablet 1   aspirin 81 MG tablet Take 81 mg by mouth every evening.      atorvastatin (LIPITOR) 40 MG tablet Take 1 tablet by mouth once daily 90 tablet 0   baclofen (LIORESAL) 10 MG tablet Take 0.5-1 tablets (5-10 mg total) by mouth 3 (three) times daily as needed for muscle spasms. 30 each 3   bisacodyl (BISACODYL) 5 MG EC tablet Take 1 tablet (5 mg total) by mouth daily as needed for moderate constipation. 30 tablet 0   Blood Glucose Monitoring Suppl (ACCU-CHEK AVIVA PLUS) w/Device KIT Check BS BID DX - E11.9 1 kit 1   cloNIDine (CATAPRES) 0.2 MG tablet Take 1 tablet (0.2 mg total) by mouth 2 (two) times daily. 180 tablet 3   Continuous Blood Gluc Receiver (FREESTYLE LIBRE 2 READER) DEVI 1 Device by Does not apply route daily at 2 am. 1 each 1   Continuous Blood Gluc Sensor (FREESTYLE LIBRE 2 SENSOR) MISC 1 Device by Does not apply route every 14 (fourteen) days. 2 each 11   doxazosin (CARDURA) 4 MG tablet Take 1 tablet by mouth once daily 90 tablet 0   Dulaglutide (TRULICITY) 0.75 MG/0.5ML SOPN Inject 0.75 mg into the skin once a week. OFFICE VISIT NEEDED FOR ADDITIONAL REFILLS  8 mL 0   glucose blood (ACCU-CHEK AVIVA) test strip Check BS BID DX - E11.9 150 each 3   hydrALAZINE (APRESOLINE) 25 MG tablet TAKE 2 TABLETS BY MOUTH THREE TIMES DAILY 180 tablet 2   insulin glargine (LANTUS) 100 UNIT/ML Solostar Pen Inject 58 Units into the skin daily. 45 mL 3   labetalol (NORMODYNE) 300 MG tablet Take 1 tablet by mouth twice daily 180 tablet 0   Lancets Misc. (ACCU-CHEK FASTCLIX LANCET) KIT Check BS BID DX - E11.9 1 kit 1   lisinopril (ZESTRIL) 20 MG tablet Take 1 tablet by mouth twice daily 180 tablet 0   Multiple Vitamin (MULTIVITAMIN WITH MINERALS) TABS tablet Take 1 tablet by mouth daily.     polyethylene glycol powder (GLYCOLAX/MIRALAX) powder Take 17 g by mouth daily. 510 g 11   potassium chloride SA (KLOR-CON M20) 20 MEQ  tablet TAKE 1  TABLET BY MOUTH ONCE DAILY ---PT  NEEDS  PHYSICAL  WITH  PRIMARY  CARE  PHYSICIAN  FOR  FUTURE  REFILLS 90 tablet 3   silver sulfADIAZINE (SILVADENE) 1 % cream Apply 1 Application topically daily. 50 g 0   traMADol (ULTRAM) 50 MG tablet TAKE 1 TABLET BY MOUTH EVERY 6 HOURS AS NEEDED 30 tablet 0   No current facility-administered medications on file prior to visit.   No Known Allergies Social History   Socioeconomic History   Marital status: Single    Spouse name: Not on file   Number of children: Not on file   Years of education: Not on file   Highest education level: Not on file  Occupational History   Not on file  Tobacco Use   Smoking status: Former   Smokeless tobacco: Never  Vaping Use   Vaping Use: Never used  Substance and Sexual Activity   Alcohol use: Yes    Comment: Occasional   Drug use: Not Currently    Types: Cocaine, Marijuana   Sexual activity: Not on file  Other Topics Concern   Not on file  Social History Narrative   Not on file   Social Determinants of Health   Financial Resource Strain: Medium Risk (03/24/2020)   Overall Financial Resource Strain (CARDIA)    Difficulty of Paying Living Expenses: Somewhat hard  Food Insecurity: Not on file  Transportation Needs: Not on file  Physical Activity: Not on file  Stress: Not on file  Social Connections: Not on file  Intimate Partner Violence: Not on file      Review of Systems  All other systems reviewed and are negative.      Objective:   Physical Exam Vitals reviewed.  Constitutional:      General: He is not in acute distress.    Appearance: He is well-developed. He is obese. He is not ill-appearing, toxic-appearing or diaphoretic.  HENT:     Head: Normocephalic and atraumatic.     Nose: No congestion or rhinorrhea.     Mouth/Throat:     Mouth: Mucous membranes are moist.     Pharynx: No oropharyngeal exudate or posterior oropharyngeal erythema.  Eyes:     General: No scleral  icterus.    Extraocular Movements: Extraocular movements intact.     Conjunctiva/sclera: Conjunctivae normal.     Pupils: Pupils are equal, round, and reactive to light.  Neck:     Vascular: No carotid bruit.  Cardiovascular:     Rate and Rhythm: Normal rate and regular rhythm.     Heart sounds: Normal  heart sounds. No murmur heard.    No friction rub. No gallop.  Pulmonary:     Effort: Pulmonary effort is normal. No respiratory distress.     Breath sounds: Normal breath sounds. No wheezing or rales.  Chest:     Chest wall: No tenderness.  Abdominal:     General: Bowel sounds are normal. There is no distension.     Palpations: Abdomen is soft.     Tenderness: There is no abdominal tenderness. There is no guarding or rebound.  Musculoskeletal:     Cervical back: Neck supple. No tenderness.     Right lower leg: Edema present.     Left lower leg: Edema present.  Lymphadenopathy:     Cervical: No cervical adenopathy.  Skin:    Coloration: Skin is not jaundiced.     Findings: No bruising or erythema.  Neurological:     Mental Status: He is alert and oriented to person, place, and time. Mental status is at baseline.     Motor: Weakness present.     Coordination: Coordination abnormal.     Gait: Gait abnormal.  Psychiatric:        Mood and Affect: Mood normal.        Behavior: Behavior normal.        Thought Content: Thought content normal.        Judgment: Judgment normal.       Type 2 diabetes mellitus with stage 3a chronic kidney disease, with long-term current use of insulin - Plan: Hemoglobin A1c, CBC with Differential/Platelet, COMPLETE METABOLIC PANEL WITH GFR, Lipid panel I continue to encourage the patient to use the continuous blood glucose monitoring system.  I explained the risk of hypoglycemia.  His last A1c was 6.6 which is excellent but I feel that this puts him in danger of hypoglycemic episodes.  I recommended that he switch his insulin to in the morning so at  least he is awake after he takes his insulin in case he does have a drop in his blood sugar.  Hopefully he will be able to feel it and get help.  Also continue to encourage him to use his monitor.  His blood pressure is outstanding.  Check CBC CMP lipid panel and A1c.  He recently had an abnormal screening test for diabetic retinopathy.  I recommended that he see an ophthalmologist but she has already schedule a follow-up appointment with

## 2022-11-14 NOTE — Addendum Note (Signed)
Addended by: Venia Carbon K on: 11/14/2022 04:45 PM   Modules accepted: Orders

## 2022-11-15 LAB — COMPLETE METABOLIC PANEL WITH GFR
AG Ratio: 1.5 (calc) (ref 1.0–2.5)
ALT: 15 U/L (ref 9–46)
AST: 12 U/L (ref 10–35)
Albumin: 3.8 g/dL (ref 3.6–5.1)
Alkaline phosphatase (APISO): 98 U/L (ref 35–144)
BUN/Creatinine Ratio: 13 (calc) (ref 6–22)
BUN: 25 mg/dL (ref 7–25)
CO2: 28 mmol/L (ref 20–32)
Calcium: 9.1 mg/dL (ref 8.6–10.3)
Chloride: 106 mmol/L (ref 98–110)
Creat: 1.9 mg/dL — ABNORMAL HIGH (ref 0.70–1.35)
Globulin: 2.5 g/dL (calc) (ref 1.9–3.7)
Glucose, Bld: 136 mg/dL — ABNORMAL HIGH (ref 65–99)
Potassium: 4.4 mmol/L (ref 3.5–5.3)
Sodium: 142 mmol/L (ref 135–146)
Total Bilirubin: 0.4 mg/dL (ref 0.2–1.2)
Total Protein: 6.3 g/dL (ref 6.1–8.1)
eGFR: 39 mL/min/{1.73_m2} — ABNORMAL LOW (ref >=60)

## 2022-11-15 LAB — CBC WITH DIFFERENTIAL/PLATELET
Absolute Monocytes: 637 cells/uL (ref 200–950)
Basophils Absolute: 39 cells/uL (ref 0–200)
Basophils Relative: 0.4 %
Eosinophils Absolute: 539 cells/uL — ABNORMAL HIGH (ref 15–500)
Eosinophils Relative: 5.5 %
HCT: 34.8 % — ABNORMAL LOW (ref 38.5–50.0)
Hemoglobin: 11.6 g/dL — ABNORMAL LOW (ref 13.2–17.1)
Lymphs Abs: 1921 cells/uL (ref 850–3900)
MCH: 28.7 pg (ref 27.0–33.0)
MCHC: 33.3 g/dL (ref 32.0–36.0)
MCV: 86.1 fL (ref 80.0–100.0)
MPV: 10.4 fL (ref 7.5–12.5)
Monocytes Relative: 6.5 %
Neutro Abs: 6664 cells/uL (ref 1500–7800)
Neutrophils Relative %: 68 %
Platelets: 239 10*3/uL (ref 140–400)
RBC: 4.04 10*6/uL — ABNORMAL LOW (ref 4.20–5.80)
RDW: 14.6 % (ref 11.0–15.0)
Total Lymphocyte: 19.6 %
WBC: 9.8 10*3/uL (ref 3.8–10.8)

## 2022-11-15 LAB — LIPID PANEL
Cholesterol: 116 mg/dL (ref <200)
HDL: 24 mg/dL — ABNORMAL LOW (ref >=40)
LDL Cholesterol (Calc): 63 mg/dL (calc)
Non-HDL Cholesterol (Calc): 92 mg/dL (calc) (ref <130)
Total CHOL/HDL Ratio: 4.8 (calc) (ref <5.0)
Triglycerides: 248 mg/dL — ABNORMAL HIGH (ref <150)

## 2022-11-15 LAB — HEMOGLOBIN A1C
Hgb A1c MFr Bld: 7.7 % of total Hgb — ABNORMAL HIGH (ref <5.7)
Mean Plasma Glucose: 174 mg/dL
eAG (mmol/L): 9.7 mmol/L

## 2022-11-21 ENCOUNTER — Telehealth: Payer: Self-pay

## 2022-11-21 NOTE — Telephone Encounter (Signed)
Pt is in need of a letter stating that he is unable to go to the community mailbox at his home due to no ramp access for his wheelchair and to have patient's mail delivered at new home mailbox.

## 2022-11-23 ENCOUNTER — Encounter: Payer: Self-pay | Admitting: Family Medicine

## 2022-11-28 ENCOUNTER — Other Ambulatory Visit: Payer: Self-pay | Admitting: Family Medicine

## 2022-12-06 DIAGNOSIS — H0102A Squamous blepharitis right eye, upper and lower eyelids: Secondary | ICD-10-CM | POA: Diagnosis not present

## 2022-12-06 DIAGNOSIS — H0102B Squamous blepharitis left eye, upper and lower eyelids: Secondary | ICD-10-CM | POA: Diagnosis not present

## 2022-12-06 DIAGNOSIS — E113211 Type 2 diabetes mellitus with mild nonproliferative diabetic retinopathy with macular edema, right eye: Secondary | ICD-10-CM | POA: Diagnosis not present

## 2022-12-06 DIAGNOSIS — H2513 Age-related nuclear cataract, bilateral: Secondary | ICD-10-CM | POA: Diagnosis not present

## 2022-12-06 LAB — HM DIABETES EYE EXAM

## 2022-12-09 ENCOUNTER — Other Ambulatory Visit: Payer: Self-pay | Admitting: Family Medicine

## 2022-12-11 NOTE — Telephone Encounter (Signed)
OV 11/14/22 Requested Prescriptions  Pending Prescriptions Disp Refills   doxazosin (CARDURA) 4 MG tablet [Pharmacy Med Name: Doxazosin Mesylate 4 MG Oral Tablet] 90 tablet 0    Sig: Take 1 tablet by mouth once daily     Cardiovascular:  Alpha Blockers Failed - 12/09/2022 12:28 PM      Failed - Valid encounter within last 6 months    Recent Outpatient Visits           1 year ago Encounter for Medicare annual wellness exam   [facility] Donita Brooks, MD   1 year ago Uncontrolled type 2 diabetes mellitus with hyperglycemia (HCC)   [facility] Donita Brooks, MD   2 years ago Uncontrolled type 2 diabetes mellitus with hyperglycemia (HCC)   [facility] Donita Brooks, MD   2 years ago Uncontrolled type 2 diabetes mellitus with hyperglycemia (HCC)   [facility] Pickard, Priscille Heidelberg, MD   2 years ago Cervical radiculopathy   [facility] Pickard, Priscille Heidelberg, MD              Passed - Last BP in normal range    BP Readings from Last 1 Encounters:  11/14/22 120/72

## 2022-12-14 ENCOUNTER — Other Ambulatory Visit: Payer: Self-pay | Admitting: Family Medicine

## 2022-12-14 ENCOUNTER — Telehealth: Payer: Self-pay

## 2022-12-14 NOTE — Telephone Encounter (Signed)
Requested Prescriptions  Pending Prescriptions Disp Refills   TRULICITY 0.75 MG/0.5ML SOPN [Pharmacy Med Name: Trulicity Subcutaneous Solution Pen-injector 0.75 MG/0.5ML] 8 mL 0    Sig: INJECT 0.75MG  (0.5ML) UNDER THE SKIN ONCE A WEEK.     Endocrinology:  Diabetes - GLP-1 Receptor Agonists Failed - 12/14/2022 12:32 PM      Failed - Valid encounter within last 6 months    Recent Outpatient Visits           1 year ago Encounter for Medicare annual wellness exam   [facility] Donita Brooks, MD   1 year ago Uncontrolled type 2 diabetes mellitus with hyperglycemia (HCC)   [facility] Donita Brooks, MD   2 years ago Uncontrolled type 2 diabetes mellitus with hyperglycemia (HCC)   [facility] Donita Brooks, MD   2 years ago Uncontrolled type 2 diabetes mellitus with hyperglycemia (HCC)   [facility] Pickard, Priscille Heidelberg, MD   2 years ago Cervical radiculopathy   [facility] Pickard, Priscille Heidelberg, MD              Passed - HBA1C is between 0 and 7.9 and within 180 days    Hgb A1c MFr Bld  Date Value Ref Range Status  11/14/2022 7.7 (H) <5.7 % of total Hgb Final    Comment:    For someone without known diabetes, a hemoglobin A1c value of 6.5% or greater indicates that they may have  diabetes and this should be confirmed with a follow-up  test. . For someone with known diabetes, a value <7% indicates  that their diabetes is well controlled and a value  greater than or equal to 7% indicates suboptimal  control. A1c targets should be individualized based on  duration of diabetes, age, comorbid conditions, and  other considerations. . Currently, no consensus exists regarding use of hemoglobin A1c for diagnosis of diabetes for children. Marland Kitchen

## 2022-12-14 NOTE — Telephone Encounter (Signed)
Pt's sister called in to ask if a refill of this med TRULICITY 0.75 MG/0.5ML could be sent to [facility]. Please advise   Cb#: [phone]

## 2022-12-16 ENCOUNTER — Other Ambulatory Visit: Payer: Self-pay | Admitting: Family Medicine

## 2022-12-19 NOTE — Telephone Encounter (Signed)
Requested medications are due for refill today.  yes  Requested medications are on the active medications list.  yes  Last refill. 09/25/2022 #180 0 rf  Future visit scheduled.   no  Notes to clinic.  Missing labs.    Requested Prescriptions  Pending Prescriptions Disp Refills   allopurinol (ZYLOPRIM) 100 MG tablet [Pharmacy Med Name: Allopurinol 100 MG Oral Tablet] 180 tablet 0    Sig: Take 1 tablet by mouth twice daily     Endocrinology:  Gout Agents - allopurinol Failed - 12/16/2022  3:12 PM      Failed - Uric Acid in normal range and within 360 days    No results found for: "POCURA", "LABURIC"       Failed - Cr in normal range and within 360 days    Creat  Date Value Ref Range Status  11/14/2022 1.90 (H) 0.70 - 1.35 mg/dL Final   Creatinine, Urine  Date Value Ref Range Status  06/29/2015 117 20 - 370 mg/dL Final         Failed - Valid encounter within last 12 months    Recent Outpatient Visits           1 year ago Encounter for Medicare annual wellness exam   [facility] Donita Brooks, MD   1 year ago Uncontrolled type 2 diabetes mellitus with hyperglycemia (HCC)   [facility] Donita Brooks, MD   2 years ago Uncontrolled type 2 diabetes mellitus with hyperglycemia (HCC)   [facility] Donita Brooks, MD   2 years ago Uncontrolled type 2 diabetes mellitus with hyperglycemia (HCC)   [facility] Pickard, Priscille Heidelberg, MD   2 years ago Cervical radiculopathy   [facility] Pickard, Priscille Heidelberg, MD              Passed - CBC within normal limits and completed in the last 12 months    WBC  Date Value Ref Range Status  11/14/2022 9.8 3.8 - 10.8 Thousand/uL Final   RBC  Date Value Ref Range Status  11/14/2022 4.04 (L) 4.20 - 5.80 Million/uL Final   Hemoglobin  Date Value Ref Range Status  11/14/2022 11.6 (L) 13.2 - 17.1 g/dL Final   HCT  Date Value Ref Range Status   11/14/2022 34.8 (L) 38.5 - 50.0 % Final   MCHC  Date Value Ref Range Status  11/14/2022 33.3 32.0 - 36.0 g/dL Final   [facility]  Date Value Ref Range Status  11/14/2022 28.7 27.0 - 33.0 pg Final   MCV  Date Value Ref Range Status  11/14/2022 86.1 80.0 - 100.0 fL Final   No results found for: "PLTCOUNTKUC", "LABPLAT", "POCPLA" RDW  Date Value Ref Range Status  11/14/2022 14.6 11.0 - 15.0 % Final

## 2022-12-23 ENCOUNTER — Other Ambulatory Visit: Payer: Self-pay | Admitting: Family Medicine

## 2022-12-25 NOTE — Telephone Encounter (Signed)
Requested Prescriptions  Pending Prescriptions Disp Refills   lisinopril (ZESTRIL) 20 MG tablet [Pharmacy Med Name: Lisinopril 20 MG Oral Tablet] 180 tablet 0    Sig: Take 1 tablet by mouth twice daily     Cardiovascular:  ACE Inhibitors Failed - 12/23/2022  6:55 PM      Failed - Cr in normal range and within 180 days    Creat  Date Value Ref Range Status  11/14/2022 1.90 (H) 0.70 - 1.35 mg/dL Final   Creatinine, Urine  Date Value Ref Range Status  06/29/2015 117 20 - 370 mg/dL Final         Failed - Valid encounter within last 6 months    Recent Outpatient Visits           1 year ago Encounter for Medicare annual wellness exam   [facility] Donita Brooks, MD   1 year ago Uncontrolled type 2 diabetes mellitus with hyperglycemia (HCC)   [facility] Donita Brooks, MD   2 years ago Uncontrolled type 2 diabetes mellitus with hyperglycemia (HCC)   [facility] Donita Brooks, MD   2 years ago Uncontrolled type 2 diabetes mellitus with hyperglycemia (HCC)   [facility] Donita Brooks, MD   2 years ago Cervical radiculopathy   [facility] Donita Brooks, MD              Passed - K in normal range and within 180 days    Potassium  Date Value Ref Range Status  11/14/2022 4.4 3.5 - 5.3 mmol/L Final         Passed - Patient is not pregnant      Passed - Last BP in normal range    BP Readings from Last 1 Encounters:  11/14/22 120/72          labetalol (NORMODYNE) 300 MG tablet [Pharmacy Med Name: Labetalol HCl 300 MG Oral Tablet] 180 tablet 0    Sig: Take 1 tablet by mouth twice daily     Cardiovascular:  Beta Blockers Failed - 12/23/2022  6:55 PM      Failed - Valid encounter within last 6 months    Recent Outpatient Visits           1 year ago Encounter for Medicare annual wellness exam   [facility] Donita Brooks, MD   1 year ago Uncontrolled type  2 diabetes mellitus with hyperglycemia (HCC)   [facility] Donita Brooks, MD   2 years ago Uncontrolled type 2 diabetes mellitus with hyperglycemia (HCC)   [facility] Donita Brooks, MD   2 years ago Uncontrolled type 2 diabetes mellitus with hyperglycemia (HCC)   [facility] Pickard, Priscille Heidelberg, MD   2 years ago Cervical radiculopathy   [facility] Pickard, Priscille Heidelberg, MD              Passed - Last BP in normal range    BP Readings from Last 1 Encounters:  11/14/22 120/72         Passed - Last Heart Rate in normal range    Pulse Readings from Last 1 Encounters:  11/14/22 62

## 2022-12-28 ENCOUNTER — Encounter: Payer: Self-pay | Admitting: Family Medicine

## 2023-01-06 ENCOUNTER — Other Ambulatory Visit: Payer: Self-pay | Admitting: Family Medicine

## 2023-01-08 NOTE — Telephone Encounter (Signed)
Requested Prescriptions  Pending Prescriptions Disp Refills   atorvastatin (LIPITOR) 40 MG tablet [Pharmacy Med Name: Atorvastatin Calcium 40 MG Oral Tablet] 90 tablet 0    Sig: Take 1 tablet by mouth once daily     Cardiovascular:  Antilipid - Statins Failed - 01/06/2023  1:17 PM      Failed - Valid encounter within last 12 months    Recent Outpatient Visits           1 year ago Encounter for Medicare annual wellness exam   [facility] Donita Brooks, MD   1 year ago Uncontrolled type 2 diabetes mellitus with hyperglycemia (HCC)   [facility] Donita Brooks, MD   2 years ago Uncontrolled type 2 diabetes mellitus with hyperglycemia (HCC)   [facility] Donita Brooks, MD   2 years ago Uncontrolled type 2 diabetes mellitus with hyperglycemia (HCC)   [facility] Pickard, Priscille Heidelberg, MD   2 years ago Cervical radiculopathy   [facility] Donita Brooks, MD              Failed - Lipid Panel in normal range within the last 12 months    Cholesterol  Date Value Ref Range Status  11/14/2022 116 <200 mg/dL Final   LDL Cholesterol (Calc)  Date Value Ref Range Status  11/14/2022 63 mg/dL (calc) Final    Comment:    Reference range: <100 . Desirable range <100 mg/dL for primary prevention;   <70 mg/dL for patients with CHD or diabetic patients  with > or = 2 CHD risk factors. Marland Kitchen LDL-C is now calculated using the Martin-Hopkins  calculation, which is a validated novel method providing  better accuracy than the Friedewald equation in the  estimation of LDL-C.  Horald Pollen et al. Lenox Ahr. 1610;960(45): 2061-2068  (http://education.[facility].com/faq/FAQ164)    HDL  Date Value Ref Range Status  11/14/2022 24 (L) > OR = 40 mg/dL Final   Triglycerides  Date Value Ref Range Status  11/14/2022 248 (H) <150 mg/dL Final    Comment:    . If a non-fasting specimen was collected,  consider repeat triglyceride testing on a fasting specimen if clinically indicated.  Perry Mount et al. J. of Clin. Lipidol. 2015;9:129-169. Marland Kitchen          Passed - Patient is not pregnant

## 2023-01-14 ENCOUNTER — Other Ambulatory Visit: Payer: Self-pay | Admitting: Family Medicine

## 2023-01-14 DIAGNOSIS — I10 Essential (primary) hypertension: Secondary | ICD-10-CM

## 2023-02-05 DIAGNOSIS — I872 Venous insufficiency (chronic) (peripheral): Secondary | ICD-10-CM | POA: Diagnosis not present

## 2023-02-05 DIAGNOSIS — B351 Tinea unguium: Secondary | ICD-10-CM | POA: Diagnosis not present

## 2023-02-05 DIAGNOSIS — E1151 Type 2 diabetes mellitus with diabetic peripheral angiopathy without gangrene: Secondary | ICD-10-CM | POA: Diagnosis not present

## 2023-02-06 ENCOUNTER — Telehealth: Payer: Self-pay

## 2023-02-06 DIAGNOSIS — Z794 Long term (current) use of insulin: Secondary | ICD-10-CM

## 2023-03-10 ENCOUNTER — Other Ambulatory Visit: Payer: Self-pay | Admitting: Family Medicine

## 2023-03-10 DIAGNOSIS — I10 Essential (primary) hypertension: Secondary | ICD-10-CM

## 2023-03-11 ENCOUNTER — Other Ambulatory Visit: Payer: Self-pay | Admitting: Family Medicine

## 2023-03-12 NOTE — Telephone Encounter (Signed)
Requested medication (s) are due for refill today:yes  Requested medication (s) are on the active medication list: yes  Last refill:  01/15/23 #180 1 RF  Future visit scheduled: yes  Notes to clinic:  labs out of normal range and missing ANA lab work   Requested Prescriptions  Pending Prescriptions Disp Refills   hydrALAZINE (APRESOLINE) 25 MG tablet [Pharmacy Med Name: hydrALAZINE HCl 25 MG Oral Tablet] 180 tablet 0    Sig: TAKE 2 TABLETS BY MOUTH THREE TIMES DAILY     Cardiovascular:  Vasodilators Failed - 03/10/2023  6:20 AM      Failed - HCT in normal range and within 360 days    HCT  Date Value Ref Range Status  11/14/2022 34.8 (L) 38.5 - 50.0 % Final         Failed - HGB in normal range and within 360 days    Hemoglobin  Date Value Ref Range Status  11/14/2022 11.6 (L) 13.2 - 17.1 g/dL Final         Failed - RBC in normal range and within 360 days    RBC  Date Value Ref Range Status  11/14/2022 4.04 (L) 4.20 - 5.80 Million/uL Final         Failed - ANA Screen, Ifa, Serum in normal range and within 360 days    No results found for: "ANA", "ANATITER", "LABANTI"       Failed - Valid encounter within last 12 months    Recent Outpatient Visits           1 year ago Encounter for Medicare annual wellness exam   [facility] Donita Brooks, MD   2 years ago Uncontrolled type 2 diabetes mellitus with hyperglycemia (HCC)   [facility] Donita Brooks, MD   2 years ago Uncontrolled type 2 diabetes mellitus with hyperglycemia (HCC)   [facility] Donita Brooks, MD   2 years ago Uncontrolled type 2 diabetes mellitus with hyperglycemia (HCC)   [facility] Donita Brooks, MD   2 years ago Cervical radiculopathy   [facility] Pickard, Priscille Heidelberg, MD              Passed - WBC in normal range and within 360 days    WBC  Date Value Ref Range Status  11/14/2022 9.8 3.8 - 10.8  Thousand/uL Final         Passed - PLT in normal range and within 360 days    Platelets  Date Value Ref Range Status  11/14/2022 239 140 - 400 Thousand/uL Final         Passed - Last BP in normal range    BP Readings from Last 1 Encounters:  11/14/22 120/72

## 2023-03-13 NOTE — Telephone Encounter (Signed)
Requested Prescriptions  Pending Prescriptions Disp Refills   doxazosin (CARDURA) 4 MG tablet [Pharmacy Med Name: Doxazosin Mesylate 4 MG Oral Tablet] 90 tablet 0    Sig: Take 1 tablet by mouth once daily     Cardiovascular:  Alpha Blockers Failed - 03/11/2023  3:45 PM      Failed - Valid encounter within last 6 months    Recent Outpatient Visits           1 year ago Encounter for Medicare annual wellness exam   [facility] Donita Brooks, MD   2 years ago Uncontrolled type 2 diabetes mellitus with hyperglycemia (HCC)   [facility] Donita Brooks, MD   2 years ago Uncontrolled type 2 diabetes mellitus with hyperglycemia (HCC)   [facility] Donita Brooks, MD   2 years ago Uncontrolled type 2 diabetes mellitus with hyperglycemia (HCC)   [facility] Pickard, Priscille Heidelberg, MD   2 years ago Cervical radiculopathy   [facility] Pickard, Priscille Heidelberg, MD              Passed - Last BP in normal range    BP Readings from Last 1 Encounters:  11/14/22 120/72          amLODipine (NORVASC) 10 MG tablet [Pharmacy Med Name: amLODIPine Besylate 10 MG Oral Tablet] 90 tablet 0    Sig: Take 1 tablet by mouth once daily     Cardiovascular: Calcium Channel Blockers 2 Failed - 03/11/2023  3:45 PM      Failed - Valid encounter within last 6 months    Recent Outpatient Visits           1 year ago Encounter for Medicare annual wellness exam   [facility] Donita Brooks, MD   2 years ago Uncontrolled type 2 diabetes mellitus with hyperglycemia (HCC)   [facility] Donita Brooks, MD   2 years ago Uncontrolled type 2 diabetes mellitus with hyperglycemia (HCC)   [facility] Donita Brooks, MD   2 years ago Uncontrolled type 2 diabetes mellitus with hyperglycemia (HCC)   [facility] Pickard, Priscille Heidelberg, MD   2 years ago Cervical  radiculopathy   [facility] Medicine Pickard, Priscille Heidelberg, MD              Passed - Last BP in normal range    BP Readings from Last 1 Encounters:  11/14/22 120/72         Passed - Last Heart Rate in normal range    Pulse Readings from Last 1 Encounters:  11/14/22 62

## 2023-03-14 DIAGNOSIS — I739 Peripheral vascular disease, unspecified: Secondary | ICD-10-CM | POA: Diagnosis not present

## 2023-03-18 ENCOUNTER — Other Ambulatory Visit: Payer: Self-pay | Admitting: Family Medicine

## 2023-03-20 ENCOUNTER — Other Ambulatory Visit: Payer: Self-pay | Admitting: Family Medicine

## 2023-03-20 NOTE — Telephone Encounter (Signed)
Requested Prescriptions  Pending Prescriptions Disp Refills   lisinopril (ZESTRIL) 20 MG tablet [Pharmacy Med Name: Lisinopril 20 MG Oral Tablet] 180 tablet 0    Sig: Take 1 tablet by mouth twice daily     Cardiovascular:  ACE Inhibitors Failed - 03/18/2023  3:50 PM      Failed - Cr in normal range and within 180 days    Creat  Date Value Ref Range Status  11/14/2022 1.90 (H) 0.70 - 1.35 mg/dL Final   Creatinine, Urine  Date Value Ref Range Status  06/29/2015 117 20 - 370 mg/dL Final         Failed - Valid encounter within last 6 months    Recent Outpatient Visits           1 year ago Encounter for Medicare annual wellness exam   [facility] Donita Brooks, MD   2 years ago Uncontrolled type 2 diabetes mellitus with hyperglycemia (HCC)   [facility] Pickard, Priscille Heidelberg, MD   2 years ago Uncontrolled type 2 diabetes mellitus with hyperglycemia (HCC)   [facility] Pickard, Priscille Heidelberg, MD   2 years ago Uncontrolled type 2 diabetes mellitus with hyperglycemia (HCC)   [facility] Pickard, Priscille Heidelberg, MD   2 years ago Cervical radiculopathy   [facility] Tanya Nones, Priscille Heidelberg, MD              Passed - K in normal range and within 180 days    Potassium  Date Value Ref Range Status  11/14/2022 4.4 3.5 - 5.3 mmol/L Final         Passed - Patient is not pregnant      Passed - Last BP in normal range    BP Readings from Last 1 Encounters:  11/14/22 120/72          allopurinol (ZYLOPRIM) 100 MG tablet [Pharmacy Med Name: Allopurinol 100 MG Oral Tablet] 180 tablet 0    Sig: Take 1 tablet by mouth twice daily     Endocrinology:  Gout Agents - allopurinol Failed - 03/18/2023  3:50 PM      Failed - Uric Acid in normal range and within 360 days    No results found for: "POCURA", "LABURIC"       Failed - Cr in normal range and within 360 days    Creat  Date Value Ref Range Status  11/14/2022 1.90  (H) 0.70 - 1.35 mg/dL Final   Creatinine, Urine  Date Value Ref Range Status  06/29/2015 117 20 - 370 mg/dL Final         Failed - Valid encounter within last 12 months    Recent Outpatient Visits           1 year ago Encounter for Medicare annual wellness exam   [facility] Donita Brooks, MD   2 years ago Uncontrolled type 2 diabetes mellitus with hyperglycemia (HCC)   [facility] Donita Brooks, MD   2 years ago Uncontrolled type 2 diabetes mellitus with hyperglycemia (HCC)   [facility] Donita Brooks, MD   2 years ago Uncontrolled type 2 diabetes mellitus with hyperglycemia (HCC)   [facility] Pickard, Priscille Heidelberg, MD   2 years ago Cervical radiculopathy   [facility] Pickard, Priscille Heidelberg, MD              Passed -  CBC within normal limits and completed in the last 12 months    WBC  Date Value Ref Range Status  11/14/2022 9.8 3.8 - 10.8 Thousand/uL Final   RBC  Date Value Ref Range Status  11/14/2022 4.04 (L) 4.20 - 5.80 Million/uL Final   Hemoglobin  Date Value Ref Range Status  11/14/2022 11.6 (L) 13.2 - 17.1 g/dL Final   HCT  Date Value Ref Range Status  11/14/2022 34.8 (L) 38.5 - 50.0 % Final   MCHC  Date Value Ref Range Status  11/14/2022 33.3 32.0 - 36.0 g/dL Final   [facility]  Date Value Ref Range Status  11/14/2022 28.7 27.0 - 33.0 pg Final   MCV  Date Value Ref Range Status  11/14/2022 86.1 80.0 - 100.0 fL Final   No results found for: "PLTCOUNTKUC", "LABPLAT", "POCPLA" RDW  Date Value Ref Range Status  11/14/2022 14.6 11.0 - 15.0 % Final          labetalol (NORMODYNE) 300 MG tablet [Pharmacy Med Name: Labetalol HCl 300 MG Oral Tablet] 180 tablet 0    Sig: Take 1 tablet by mouth twice daily     Cardiovascular:  Beta Blockers Failed - 03/18/2023  3:50 PM      Failed - Valid encounter within last 6 months    Recent Outpatient Visits           1 year ago  Encounter for Medicare annual wellness exam   [facility] Donita Brooks, MD   2 years ago Uncontrolled type 2 diabetes mellitus with hyperglycemia (HCC)   [facility] Donita Brooks, MD   2 years ago Uncontrolled type 2 diabetes mellitus with hyperglycemia (HCC)   [facility] Donita Brooks, MD   2 years ago Uncontrolled type 2 diabetes mellitus with hyperglycemia (HCC)   [facility] Pickard, Priscille Heidelberg, MD   2 years ago Cervical radiculopathy   [facility] Pickard, Priscille Heidelberg, MD              Passed - Last BP in normal range    BP Readings from Last 1 Encounters:  11/14/22 120/72         Passed - Last Heart Rate in normal range    Pulse Readings from Last 1 Encounters:  11/14/22 62

## 2023-03-21 NOTE — Telephone Encounter (Signed)
Due to a system glitch the last office visit for this practice is not detected correctly.    LOV 11/14/2022 and labs are in date.  Requested Prescriptions  Pending Prescriptions Disp Refills   TRULICITY 0.75 MG/0.5ML SOPN [Pharmacy Med Name: Trulicity Subcutaneous Solution Pen-injector 0.75 MG/0.5ML] 8 mL 0    Sig: INJECT 0.75MG  (0.5ML) UNDER THE SKIN ONCE A WEEK.     Endocrinology:  Diabetes - GLP-1 Receptor Agonists Failed - 03/20/2023  8:03 AM      Failed - Valid encounter within last 6 months    Recent Outpatient Visits           1 year ago Encounter for Medicare annual wellness exam   [facility] Donita Brooks, MD   2 years ago Uncontrolled type 2 diabetes mellitus with hyperglycemia (HCC)   [facility] Donita Brooks, MD   2 years ago Uncontrolled type 2 diabetes mellitus with hyperglycemia (HCC)   [facility] Donita Brooks, MD   2 years ago Uncontrolled type 2 diabetes mellitus with hyperglycemia (HCC)   [facility] Pickard, Priscille Heidelberg, MD   2 years ago Cervical radiculopathy   [facility] Medicine Pickard, Priscille Heidelberg, MD              Passed - HBA1C is between 0 and 7.9 and within 180 days    Hgb A1c MFr Bld  Date Value Ref Range Status  11/14/2022 7.7 (H) <5.7 % of total Hgb Final    Comment:    For someone without known diabetes, a hemoglobin A1c value of 6.5% or greater indicates that they may have  diabetes and this should be confirmed with a follow-up  test. . For someone with known diabetes, a value <7% indicates  that their diabetes is well controlled and a value  greater than or equal to 7% indicates suboptimal  control. A1c targets should be individualized based on  duration of diabetes, age, comorbid conditions, and  other considerations. . Currently, no consensus exists regarding use of hemoglobin A1c for diagnosis of diabetes for children. Marland Kitchen

## 2023-04-08 ENCOUNTER — Other Ambulatory Visit: Payer: Self-pay | Admitting: Family Medicine

## 2023-04-10 NOTE — Telephone Encounter (Signed)
Requested medication (s) are due for refill today: yes   Requested medication (s) are on the active medication list: yes   Last refill:  01/08/23 #90 0 refills   Future visit scheduled: no   Notes to clinic:  last OV 11/14/22. No refills remain. Do you want to refill Rx?     Requested Prescriptions  Pending Prescriptions Disp Refills   atorvastatin (LIPITOR) 40 MG tablet [Pharmacy Med Name: Atorvastatin Calcium 40 MG Oral Tablet] 90 tablet 0    Sig: Take 1 tablet by mouth once daily     Cardiovascular:  Antilipid - Statins Failed - 04/08/2023  4:56 PM      Failed - Valid encounter within last 12 months    Recent Outpatient Visits           1 year ago Encounter for Medicare annual wellness exam   [facility] Donita Brooks, MD   2 years ago Uncontrolled type 2 diabetes mellitus with hyperglycemia (HCC)   [facility] Medicine Donita Brooks, MD   2 years ago Uncontrolled type 2 diabetes mellitus with hyperglycemia (HCC)   [facility] Donita Brooks, MD   2 years ago Uncontrolled type 2 diabetes mellitus with hyperglycemia (HCC)   [facility] Pickard, Priscille Heidelberg, MD   2 years ago Cervical radiculopathy   [facility] Donita Brooks, MD              Failed - Lipid Panel in normal range within the last 12 months    Cholesterol  Date Value Ref Range Status  11/14/2022 116 <200 mg/dL Final   LDL Cholesterol (Calc)  Date Value Ref Range Status  11/14/2022 63 mg/dL (calc) Final    Comment:    Reference range: <100 . Desirable range <100 mg/dL for primary prevention;   <70 mg/dL for patients with CHD or diabetic patients  with > or = 2 CHD risk factors. Marland Kitchen LDL-C is now calculated using the Martin-Hopkins  calculation, which is a validated novel method providing  better accuracy than the Friedewald equation in the  estimation of LDL-C.  Horald Pollen et al. Lenox Ahr. 6045;409(81): 2061-2068   (http://education.[facility].com/faq/FAQ164)    HDL  Date Value Ref Range Status  11/14/2022 24 (L) > OR = 40 mg/dL Final   Triglycerides  Date Value Ref Range Status  11/14/2022 248 (H) <150 mg/dL Final    Comment:    . If a non-fasting specimen was collected, consider repeat triglyceride testing on a fasting specimen if clinically indicated.  Perry Mount et al. J. of Clin. Lipidol. 2015;9:129-169. Marland Kitchen          Passed - Patient is not pregnant

## 2023-04-20 ENCOUNTER — Other Ambulatory Visit: Payer: Self-pay

## 2023-04-20 DIAGNOSIS — E7800 Pure hypercholesterolemia, unspecified: Secondary | ICD-10-CM

## 2023-04-20 DIAGNOSIS — E1122 Type 2 diabetes mellitus with diabetic chronic kidney disease: Secondary | ICD-10-CM

## 2023-04-20 MED ORDER — ATORVASTATIN CALCIUM 40 MG PO TABS
40.0000 mg | ORAL_TABLET | Freq: Every day | ORAL | 2 refills | Status: DC
Start: 2023-04-20 — End: 2023-07-19

## 2023-04-20 NOTE — Telephone Encounter (Signed)
LOV 11/14/22  Refill requested for   atorvastatin (LIPITOR) 40 MG tablet [[id number]]   Pharmacy confirmed as:  [facility] [address] [place] - [address], [place] [address] Phone: [phone]  Fax: [phone]   Please advise at [phone].

## 2023-04-28 ENCOUNTER — Other Ambulatory Visit: Payer: Self-pay | Admitting: Family Medicine

## 2023-04-28 DIAGNOSIS — G8111 Spastic hemiplegia affecting right dominant side: Secondary | ICD-10-CM

## 2023-04-28 DIAGNOSIS — N1831 Chronic kidney disease, stage 3a: Secondary | ICD-10-CM

## 2023-04-28 DIAGNOSIS — I10 Essential (primary) hypertension: Secondary | ICD-10-CM

## 2023-04-30 NOTE — Telephone Encounter (Signed)
OV 11/14/22 Requested Prescriptions  Pending Prescriptions Disp Refills   cloNIDine (CATAPRES) 0.2 MG tablet [Pharmacy Med Name: cloNIDine HCl 0.2 MG Oral Tablet] 180 tablet 1    Sig: Take 1 tablet by mouth twice daily     Cardiovascular:  Alpha-2 Agonists Failed - 04/28/2023  4:00 PM      Failed - Valid encounter within last 6 months    Recent Outpatient Visits           2 years ago Encounter for Medicare annual wellness exam   [facility] Donita Brooks, MD   2 years ago Uncontrolled type 2 diabetes mellitus with hyperglycemia (HCC)   [facility] Medicine Donita Brooks, MD   2 years ago Uncontrolled type 2 diabetes mellitus with hyperglycemia (HCC)   [facility] Donita Brooks, MD   2 years ago Uncontrolled type 2 diabetes mellitus with hyperglycemia (HCC)   [facility] Pickard, Priscille Heidelberg, MD   2 years ago Cervical radiculopathy   [facility] Medicine Pickard, Priscille Heidelberg, MD              Passed - Last BP in normal range    BP Readings from Last 1 Encounters:  11/14/22 120/72         Passed - Last Heart Rate in normal range    Pulse Readings from Last 1 Encounters:  11/14/22 62

## 2023-05-17 DIAGNOSIS — I739 Peripheral vascular disease, unspecified: Secondary | ICD-10-CM | POA: Diagnosis not present

## 2023-05-17 DIAGNOSIS — E1151 Type 2 diabetes mellitus with diabetic peripheral angiopathy without gangrene: Secondary | ICD-10-CM | POA: Diagnosis not present

## 2023-05-17 DIAGNOSIS — I872 Venous insufficiency (chronic) (peripheral): Secondary | ICD-10-CM | POA: Diagnosis not present

## 2023-05-17 DIAGNOSIS — B351 Tinea unguium: Secondary | ICD-10-CM | POA: Diagnosis not present

## 2023-05-29 ENCOUNTER — Other Ambulatory Visit: Payer: Self-pay | Admitting: Family Medicine

## 2023-05-30 NOTE — Telephone Encounter (Signed)
Requested medication (s) are due for refill today: yes  Requested medication (s) are on the active medication list:yes Last refill:  11/14/22  Future visit scheduled: no  Notes to clinic:  appt on 11/14/22 note incomplete but insulin was reordered// unsure if can refill it   Requested Prescriptions  Pending Prescriptions Disp Refills   Insulin Glargine (BASAGLAR KWIKPEN) 100 UNIT/ML [Pharmacy Med Name: Basaglar KwikPen Subcutaneous Solution Pen-injector 100 UNIT/ML] 60 mL 0    Sig: DIAL AND INJECT 58 UNITS UNDER THE SKIN DAILY. MAX DAILY DOSE 58 UNITS.     Endocrinology:  Diabetes - Insulins Failed - 05/29/2023  8:08 AM      Failed - HBA1C is between 0 and 7.9 and within 180 days    Hgb A1c MFr Bld  Date Value Ref Range Status  11/14/2022 7.7 (H) <5.7 % of total Hgb Final    Comment:    For someone without known diabetes, a hemoglobin A1c value of 6.5% or greater indicates that they may have  diabetes and this should be confirmed with a follow-up  test. . For someone with known diabetes, a value <7% indicates  that their diabetes is well controlled and a value  greater than or equal to 7% indicates suboptimal  control. A1c targets should be individualized based on  duration of diabetes, age, comorbid conditions, and  other considerations. . Currently, no consensus exists regarding use of hemoglobin A1c for diagnosis of diabetes for children. .          Failed - Valid encounter within last 6 months    Recent Outpatient Visits           2 years ago Encounter for Medicare annual wellness exam   [facility] Donita Brooks, MD   2 years ago Uncontrolled type 2 diabetes mellitus with hyperglycemia (HCC)   [facility] Medicine Donita Brooks, MD   2 years ago Uncontrolled type 2 diabetes mellitus with hyperglycemia (HCC)   [facility] Medicine Donita Brooks, MD   2 years ago Uncontrolled type 2 diabetes mellitus with hyperglycemia  (HCC)   [facility] Pickard, Priscille Heidelberg, MD   2 years ago Cervical radiculopathy   [facility] Medicine Pickard, Priscille Heidelberg, MD

## 2023-06-02 ENCOUNTER — Other Ambulatory Visit: Payer: Self-pay | Admitting: Family Medicine

## 2023-06-04 NOTE — Telephone Encounter (Signed)
Requested medication (s) are due for refill today: Yes  Requested medication (s) are on the active medication list: Yes  Last refill:  03/13/23  Future visit scheduled: No  Notes to clinic:  See request.    Requested Prescriptions  Pending Prescriptions Disp Refills   doxazosin (CARDURA) 4 MG tablet [Pharmacy Med Name: Doxazosin Mesylate 4 MG Oral Tablet] 90 tablet 0    Sig: Take 1 tablet by mouth once daily     Cardiovascular:  Alpha Blockers Failed - 06/04/2023  3:16 PM      Failed - Valid encounter within last 6 months    Recent Outpatient Visits           2 years ago Encounter for Medicare annual wellness exam   [facility] Donita Brooks, MD   2 years ago Uncontrolled type 2 diabetes mellitus with hyperglycemia (HCC)   [facility] Donita Brooks, MD   2 years ago Uncontrolled type 2 diabetes mellitus with hyperglycemia (HCC)   [facility] Donita Brooks, MD   2 years ago Uncontrolled type 2 diabetes mellitus with hyperglycemia (HCC)   [facility] Medicine Pickard, Priscille Heidelberg, MD   2 years ago Cervical radiculopathy   [facility] Medicine Pickard, Priscille Heidelberg, MD              Passed - Last BP in normal range    BP Readings from Last 1 Encounters:  11/14/22 120/72          amLODipine (NORVASC) 10 MG tablet [Pharmacy Med Name: amLODIPine Besylate 10 MG Oral Tablet] 90 tablet 0    Sig: Take 1 tablet by mouth once daily     Cardiovascular: Calcium Channel Blockers 2 Failed - 06/04/2023  3:16 PM      Failed - Valid encounter within last 6 months    Recent Outpatient Visits           2 years ago Encounter for Medicare annual wellness exam   [facility] Donita Brooks, MD   2 years ago Uncontrolled type 2 diabetes mellitus with hyperglycemia (HCC)   [facility] Medicine Donita Brooks, MD   2 years ago Uncontrolled type 2 diabetes mellitus with hyperglycemia  (HCC)   [facility] Medicine Donita Brooks, MD   2 years ago Uncontrolled type 2 diabetes mellitus with hyperglycemia (HCC)   [facility] Medicine Pickard, Priscille Heidelberg, MD   2 years ago Cervical radiculopathy   [facility] Medicine Pickard, Priscille Heidelberg, MD              Passed - Last BP in normal range    BP Readings from Last 1 Encounters:  11/14/22 120/72         Passed - Last Heart Rate in normal range    Pulse Readings from Last 1 Encounters:  11/14/22 62

## 2023-06-11 ENCOUNTER — Other Ambulatory Visit: Payer: Self-pay

## 2023-06-11 ENCOUNTER — Telehealth: Payer: Self-pay

## 2023-06-11 ENCOUNTER — Other Ambulatory Visit: Payer: Self-pay | Admitting: Family Medicine

## 2023-06-11 DIAGNOSIS — I10 Essential (primary) hypertension: Secondary | ICD-10-CM

## 2023-06-11 DIAGNOSIS — N1831 Chronic kidney disease, stage 3a: Secondary | ICD-10-CM

## 2023-06-11 MED ORDER — DOXAZOSIN MESYLATE 4 MG PO TABS: 4.0000 mg | ORAL_TABLET | Freq: Every day | ORAL | 0 refills | Status: DC

## 2023-06-11 MED ORDER — AMLODIPINE BESYLATE 10 MG PO TABS
10.0000 mg | ORAL_TABLET | Freq: Every day | ORAL | 0 refills | Status: DC
Start: 2023-06-11 — End: 2023-07-16

## 2023-06-11 NOTE — Telephone Encounter (Signed)
Copied from CRM [id number]. Topic: Clinical - Medication Refill >> Jun 11, 2023 11:55 AM Almira Coaster wrote: Most Recent Primary Care Visit:  Provider: Lynnea Ferrier T  Department: [facility]  Visit Type: OFFICE VISIT  Date: 11/14/2022  Medication: amLODipine (NORVASC) 10 MG tablet & doxazosin (CARDURA) 4 MG tablet  Has the patient contacted their pharmacy? Yes (Agent: If no, request that the patient contact the pharmacy for the refill. If patient does not wish to contact the pharmacy document the reason why and proceed with request.) (Agent: If yes, when and what did the pharmacy advise?)  Is this the correct pharmacy for this prescription? Yes If no, delete pharmacy and type the correct one.  This is the patient's preferred pharmacy:  [facility] [address] [place] - [address] [place] [address] Phone: [phone] Fax: [phone]  [facility] - [place], [place] - [facility] AT Portal to Registered [address] [place] [address] Phone: [phone] Fax: [phone]  [facility] - [place], [place] - [address] [place] [address] Phone: [phone] Fax: [phone]   Has the prescription been filled recently? No   Is the patient out of the medication? Yes  Has the patient been seen for an appointment in the last year OR does the patient have an upcoming appointment? Yes  Can we respond through MyChart? No, Patient prefers a call  Agent: Please be advised that Rx refills may take up to 3 business days. We ask that you follow-up with your pharmacy.

## 2023-06-12 NOTE — Telephone Encounter (Signed)
Requested medication (s) are due for refill today: yes  Requested medication (s) are on the active medication list: yes  Last refill:  03/20/23 #90/0  Future visit scheduled: no  Notes to clinic:  Unable to refill per protocol due to failed labs, no updated results.      Requested Prescriptions  Pending Prescriptions Disp Refills   allopurinol (ZYLOPRIM) 100 MG tablet [Pharmacy Med Name: Allopurinol 100 MG Oral Tablet] 180 tablet 0    Sig: Take 1 tablet by mouth twice daily     Endocrinology:  Gout Agents - allopurinol Failed - 06/11/2023 11:44 AM      Failed - Uric Acid in normal range and within 360 days    No results found for: "POCURA", "LABURIC"       Failed - Cr in normal range and within 360 days    Creat  Date Value Ref Range Status  11/14/2022 1.90 (H) 0.70 - 1.35 mg/dL Final   Creatinine, Urine  Date Value Ref Range Status  06/29/2015 117 20 - 370 mg/dL Final         Failed - Valid encounter within last 12 months    Recent Outpatient Visits           2 years ago Encounter for Medicare annual wellness exam   [facility] Donita Brooks, MD   2 years ago Uncontrolled type 2 diabetes mellitus with hyperglycemia (HCC)   [facility] Donita Brooks, MD   2 years ago Uncontrolled type 2 diabetes mellitus with hyperglycemia (HCC)   [facility] Donita Brooks, MD   2 years ago Uncontrolled type 2 diabetes mellitus with hyperglycemia (HCC)   [facility] Pickard, Priscille Heidelberg, MD   3 years ago Cervical radiculopathy   [facility] Pickard, Priscille Heidelberg, MD              Passed - CBC within normal limits and completed in the last 12 months    WBC  Date Value Ref Range Status  11/14/2022 9.8 3.8 - 10.8 Thousand/uL Final   RBC  Date Value Ref Range Status  11/14/2022 4.04 (L) 4.20 - 5.80 Million/uL Final   Hemoglobin  Date Value Ref Range Status  11/14/2022 11.6 (L) 13.2 - 17.1  g/dL Final   HCT  Date Value Ref Range Status  11/14/2022 34.8 (L) 38.5 - 50.0 % Final   MCHC  Date Value Ref Range Status  11/14/2022 33.3 32.0 - 36.0 g/dL Final   [facility]  Date Value Ref Range Status  11/14/2022 28.7 27.0 - 33.0 pg Final   MCV  Date Value Ref Range Status  11/14/2022 86.1 80.0 - 100.0 fL Final   No results found for: "PLTCOUNTKUC", "LABPLAT", "POCPLA" RDW  Date Value Ref Range Status  11/14/2022 14.6 11.0 - 15.0 % Final

## 2023-06-19 ENCOUNTER — Other Ambulatory Visit: Payer: Self-pay | Admitting: Family Medicine

## 2023-06-19 NOTE — Telephone Encounter (Signed)
Requested Prescriptions  Pending Prescriptions Disp Refills   Dulaglutide (TRULICITY) 0.75 MG/0.5ML SOAJ [Pharmacy Med Name: Trulicity Subcutaneous Solution Auto-injector 0.75 MG/0.5ML] 2 mL 0    Sig: INJECT 0.75MG  (0.5ML) UNDER THE SKIN ONCE A WEEK.     Endocrinology:  Diabetes - GLP-1 Receptor Agonists Failed - 06/19/2023  4:29 PM      Failed - HBA1C is between 0 and 7.9 and within 180 days    Hgb A1c MFr Bld  Date Value Ref Range Status  11/14/2022 7.7 (H) <5.7 % of total Hgb Final    Comment:    For someone without known diabetes, a hemoglobin A1c value of 6.5% or greater indicates that they may have  diabetes and this should be confirmed with a follow-up  test. . For someone with known diabetes, a value <7% indicates  that their diabetes is well controlled and a value  greater than or equal to 7% indicates suboptimal  control. A1c targets should be individualized based on  duration of diabetes, age, comorbid conditions, and  other considerations. . Currently, no consensus exists regarding use of hemoglobin A1c for diagnosis of diabetes for children. .          Failed - Valid encounter within last 6 months    Recent Outpatient Visits           2 years ago Encounter for Medicare annual wellness exam   [facility] Donita Brooks, MD   2 years ago Uncontrolled type 2 diabetes mellitus with hyperglycemia (HCC)   [facility] Donita Brooks, MD   2 years ago Uncontrolled type 2 diabetes mellitus with hyperglycemia (HCC)   [facility] Donita Brooks, MD   2 years ago Uncontrolled type 2 diabetes mellitus with hyperglycemia (HCC)   [facility] Pickard, Priscille Heidelberg, MD   3 years ago Cervical radiculopathy   [facility] Pickard, Priscille Heidelberg, MD

## 2023-06-26 ENCOUNTER — Other Ambulatory Visit: Payer: Self-pay | Admitting: Family Medicine

## 2023-06-26 ENCOUNTER — Other Ambulatory Visit: Payer: Self-pay

## 2023-06-26 MED ORDER — ALLOPURINOL 100 MG PO TABS: 100.0000 mg | ORAL_TABLET | Freq: Two times a day (BID) | ORAL | 0 refills | Status: DC

## 2023-06-26 NOTE — Telephone Encounter (Signed)
Prescription Request  06/26/2023  LOV: 11/14/2022  What is the name of the medication or equipment? allopurinol (ZYLOPRIM) 100 MG tablet    Have you contacted your pharmacy to request a refill? Yes   Which pharmacy would you like this sent to?  [facility] [address] [place], [place] - [address] [place] [address] Phone: [phone] Fax: [phone]    Patient notified that their request is being sent to the clinical staff for review and that they should receive a response within 2 business days.   Please advise at [facility] [phone]

## 2023-06-28 NOTE — Telephone Encounter (Signed)
Requested medication (s) are due for refill today: Yes  Requested medication (s) are on the active medication list: Yes  Last refill:  06/26/23  Future visit scheduled: Yes  Notes to clinic:  Unable to refill per protocol, appointment needed.      Requested Prescriptions  Pending Prescriptions Disp Refills   allopurinol (ZYLOPRIM) 100 MG tablet 180 tablet 0    Sig: Take 1 tablet (100 mg total) by mouth 2 (two) times daily.     Endocrinology:  Gout Agents - allopurinol Failed - 06/26/2023  1:28 PM      Failed - Uric Acid in normal range and within 360 days    No results found for: "POCURA", "LABURIC"       Failed - Cr in normal range and within 360 days    Creat  Date Value Ref Range Status  11/14/2022 1.90 (H) 0.70 - 1.35 mg/dL Final   Creatinine, Urine  Date Value Ref Range Status  06/29/2015 117 20 - 370 mg/dL Final         Failed - Valid encounter within last 12 months    Recent Outpatient Visits           2 years ago Encounter for Medicare annual wellness exam   [facility] Donita Brooks, MD   2 years ago Uncontrolled type 2 diabetes mellitus with hyperglycemia (HCC)   [facility] Donita Brooks, MD   2 years ago Uncontrolled type 2 diabetes mellitus with hyperglycemia (HCC)   [facility] Donita Brooks, MD   2 years ago Uncontrolled type 2 diabetes mellitus with hyperglycemia (HCC)   [facility] Pickard, Priscille Heidelberg, MD   3 years ago Cervical radiculopathy   [facility] Pickard, Priscille Heidelberg, MD              Passed - CBC within normal limits and completed in the last 12 months    WBC  Date Value Ref Range Status  11/14/2022 9.8 3.8 - 10.8 Thousand/uL Final   RBC  Date Value Ref Range Status  11/14/2022 4.04 (L) 4.20 - 5.80 Million/uL Final   Hemoglobin  Date Value Ref Range Status  11/14/2022 11.6 (L) 13.2 - 17.1 g/dL Final   HCT  Date Value Ref Range Status   11/14/2022 34.8 (L) 38.5 - 50.0 % Final   MCHC  Date Value Ref Range Status  11/14/2022 33.3 32.0 - 36.0 g/dL Final   [facility]  Date Value Ref Range Status  11/14/2022 28.7 27.0 - 33.0 pg Final   MCV  Date Value Ref Range Status  11/14/2022 86.1 80.0 - 100.0 fL Final   No results found for: "PLTCOUNTKUC", "LABPLAT", "POCPLA" RDW  Date Value Ref Range Status  11/14/2022 14.6 11.0 - 15.0 % Final

## 2023-06-29 ENCOUNTER — Encounter: Payer: Self-pay | Admitting: Family Medicine

## 2023-06-29 ENCOUNTER — Ambulatory Visit (INDEPENDENT_AMBULATORY_CARE_PROVIDER_SITE_OTHER): Payer: Medicare HMO | Admitting: Family Medicine

## 2023-06-29 VITALS — BP 132/76 | HR 63 | Ht 76.0 in | Wt 285.0 lb

## 2023-06-29 DIAGNOSIS — Z23 Encounter for immunization: Secondary | ICD-10-CM | POA: Diagnosis not present

## 2023-06-29 DIAGNOSIS — I10 Essential (primary) hypertension: Secondary | ICD-10-CM

## 2023-06-29 DIAGNOSIS — I739 Peripheral vascular disease, unspecified: Secondary | ICD-10-CM | POA: Insufficient documentation

## 2023-06-29 DIAGNOSIS — N1831 Chronic kidney disease, stage 3a: Secondary | ICD-10-CM | POA: Diagnosis not present

## 2023-06-29 DIAGNOSIS — Z794 Long term (current) use of insulin: Secondary | ICD-10-CM

## 2023-06-29 DIAGNOSIS — E1122 Type 2 diabetes mellitus with diabetic chronic kidney disease: Secondary | ICD-10-CM

## 2023-06-29 MED ORDER — FREESTYLE LIBRE 2 SENSOR MISC: 1.0000 | 11 refills | Status: AC

## 2023-06-29 MED ORDER — FREESTYLE LIBRE 2 READER DEVI: 1.0000 | Freq: Every day | 1 refills | Status: AC

## 2023-06-29 MED ORDER — LABETALOL HCL 300 MG PO TABS: 300.0000 mg | ORAL_TABLET | Freq: Two times a day (BID) | ORAL | 3 refills | Status: DC

## 2023-06-29 MED ORDER — LISINOPRIL 20 MG PO TABS: 20.0000 mg | ORAL_TABLET | Freq: Two times a day (BID) | ORAL | 3 refills | Status: DC

## 2023-06-29 NOTE — Progress Notes (Signed)
Subjective:    Patient ID: Eduardo Young, male    DOB: 08/09/1955, 67 y.o.   MRN: [id number] Patient is here today for checkup.  He has a history of insulin-dependent diabetes mellitus.  He is currently on 58 units of Basaglar once daily.  He takes this at night prior to going to bed.  He is also not checking his sugars.  I prescribed for the patient continuous blood glucose monitoring.  He has the equipment at home but he is just not using it.  I explained the risk of hypoglycemia.  His last hemoglobin A1c was 7.7 in April.  I been hesitant to uptitrate his Trulicity or change his insulin without checking his sugars.  Unfortunately his GFR has dropped below 40.  I explained to the patient that this is likely due to his uncontrolled diabetes.  I also explained to him that the best thing we can do to protect his kidneys would be to better manage his blood sugars.  However this requires him to check his sugars.  His blood pressure today is outstanding at 132/76.  He is due for a flu shot. Past Medical History:  Diagnosis Date   Chronic venous insufficiency    Diabetes mellitus    GERD (gastroesophageal reflux disease)    Gout    Hemiparesis (HCC)    Hyperlipidemia    Hypertension    Obesity    Renal calculi    Sleep apnea, obstructive    Stroke [facility])    [id number]   Past Surgical History:  Procedure Laterality Date   BIOPSY  09/06/2018   Procedure: BIOPSY;  Surgeon: Beverley Fiedler, MD;  Location: [facility];  Service: Gastroenterology;;   COLONOSCOPY WITH PROPOFOL N/A 09/06/2018   Procedure: COLONOSCOPY WITH PROPOFOL;  Surgeon: Beverley Fiedler, MD;  Location: [facility];  Service: Gastroenterology;  Laterality: N/A;   LITHOTRIPSY     NASAL POLYP EXCISION     Current Outpatient Medications on File Prior to Visit  Medication Sig Dispense Refill   ACCU-CHEK FASTCLIX LANCETS MISC Check BS BID DX - E11.9 200 each 3   allopurinol (ZYLOPRIM) 100 MG tablet Take 1 tablet (100 mg total) by mouth 2  (two) times daily. Take 1 tablet by mouth twice daily 60 tablet 0   amLODipine (NORVASC) 10 MG tablet Take 1 tablet (10 mg total) by mouth daily. 30 tablet 0   aspirin 81 MG tablet Take 81 mg by mouth every evening.      atorvastatin (LIPITOR) 40 MG tablet Take 1 tablet (40 mg total) by mouth daily. 90 tablet 2   baclofen (LIORESAL) 10 MG tablet Take 0.5-1 tablets (5-10 mg total) by mouth 3 (three) times daily as needed for muscle spasms. 30 each 3   bisacodyl (BISACODYL) 5 MG EC tablet Take 1 tablet (5 mg total) by mouth daily as needed for moderate constipation. 30 tablet 0   Blood Glucose Monitoring Suppl (ACCU-CHEK AVIVA PLUS) w/Device KIT Check BS BID DX - E11.9 1 kit 1   cloNIDine (CATAPRES) 0.2 MG tablet Take 1 tablet by mouth twice daily 180 tablet 1   Continuous Blood Gluc Receiver (FREESTYLE LIBRE 2 READER) DEVI 1 Device by Does not apply route daily at 2 am. 1 each 1   Continuous Blood Gluc Sensor (FREESTYLE LIBRE 2 SENSOR) MISC 1 Device by Does not apply route every 14 (fourteen) days. 2 each 11   doxazosin (CARDURA) 4 MG tablet Take 1 tablet (4 mg total) by  mouth daily. 90 tablet 0   Dulaglutide (TRULICITY) 0.75 MG/0.5ML SOAJ INJECT 0.75MG  (0.5ML) UNDER THE SKIN ONCE A WEEK. 2 mL 0   glucose blood (ACCU-CHEK AVIVA) test strip Check BS BID DX - E11.9 150 each 3   hydrALAZINE (APRESOLINE) 25 MG tablet TAKE 2 TABLETS BY MOUTH THREE TIMES DAILY 180 tablet 3   insulin glargine (LANTUS) 100 UNIT/ML Solostar Pen Inject 58 Units into the skin daily. 45 mL 3   labetalol (NORMODYNE) 300 MG tablet Take 1 tablet by mouth twice daily 180 tablet 0   Lancets Misc. (ACCU-CHEK FASTCLIX LANCET) KIT Check BS BID DX - E11.9 1 kit 1   lisinopril (ZESTRIL) 20 MG tablet Take 1 tablet by mouth twice daily 180 tablet 0   Multiple Vitamin (MULTIVITAMIN WITH MINERALS) TABS tablet Take 1 tablet by mouth daily.     polyethylene glycol powder (GLYCOLAX/MIRALAX) powder Take 17 g by mouth daily. 510 g 11    potassium chloride SA (KLOR-CON M20) 20 MEQ tablet TAKE 1  TABLET BY MOUTH ONCE DAILY ---PT  NEEDS  PHYSICAL  WITH  PRIMARY  CARE  PHYSICIAN  FOR  FUTURE  REFILLS 90 tablet 3   silver sulfADIAZINE (SILVADENE) 1 % cream Apply 1 Application topically daily. 50 g 0   traMADol (ULTRAM) 50 MG tablet TAKE 1 TABLET BY MOUTH EVERY 6 HOURS AS NEEDED 30 tablet 0   No current facility-administered medications on file prior to visit.   No Known Allergies Social History   Socioeconomic History   Marital status: Single    Spouse name: Not on file   Number of children: Not on file   Years of education: Not on file   Highest education level: Not on file  Occupational History   Not on file  Tobacco Use   Smoking status: Former   Smokeless tobacco: Never  Vaping Use   Vaping status: Never Used  Substance and Sexual Activity   Alcohol use: Yes    Comment: Occasional   Drug use: Not Currently    Types: Cocaine, Marijuana   Sexual activity: Not on file  Other Topics Concern   Not on file  Social History Narrative   Not on file   Social Determinants of Health   Financial Resource Strain: Medium Risk (03/24/2020)   Overall Financial Resource Strain (CARDIA)    Difficulty of Paying Living Expenses: Somewhat hard  Food Insecurity: Not on file  Transportation Needs: Not on file  Physical Activity: Not on file  Stress: Not on file  Social Connections: Not on file  Intimate Partner Violence: Not on file      Review of Systems  All other systems reviewed and are negative.      Objective:   Physical Exam Vitals reviewed.  Constitutional:      General: He is not in acute distress.    Appearance: He is well-developed. He is obese. He is not ill-appearing, toxic-appearing or diaphoretic.  HENT:     Head: Normocephalic and atraumatic.     Nose: No congestion or rhinorrhea.     Mouth/Throat:     Mouth: Mucous membranes are moist.     Pharynx: No oropharyngeal exudate or posterior  oropharyngeal erythema.  Eyes:     General: No scleral icterus.    Extraocular Movements: Extraocular movements intact.     Conjunctiva/sclera: Conjunctivae normal.     Pupils: Pupils are equal, round, and reactive to light.  Neck:     Vascular: No carotid bruit.  Cardiovascular:     Rate and Rhythm: Normal rate and regular rhythm.     Heart sounds: Normal heart sounds. No murmur heard.    No friction rub. No gallop.  Pulmonary:     Effort: Pulmonary effort is normal. No respiratory distress.     Breath sounds: Normal breath sounds. No wheezing or rales.  Chest:     Chest wall: No tenderness.  Abdominal:     General: Bowel sounds are normal. There is no distension.     Palpations: Abdomen is soft.     Tenderness: There is no abdominal tenderness. There is no guarding or rebound.  Musculoskeletal:     Cervical back: Neck supple. No tenderness.     Right lower leg: Edema present.     Left lower leg: Edema present.  Lymphadenopathy:     Cervical: No cervical adenopathy.  Skin:    Coloration: Skin is not jaundiced.     Findings: No bruising or erythema.  Neurological:     Mental Status: He is alert and oriented to person, place, and time. Mental status is at baseline.     Motor: Weakness present.     Coordination: Coordination abnormal.     Gait: Gait abnormal.  Psychiatric:        Mood and Affect: Mood normal.        Behavior: Behavior normal.        Thought Content: Thought content normal.        Judgment: Judgment normal.       Type 2 diabetes mellitus with stage 3a chronic kidney disease, with long-term current use of insulin (HCC) - Plan: Hemoglobin A1c, COMPLETE METABOLIC PANEL WITH GFR, CBC with Differential/Platelet  Flu vaccine need - Plan: Flu Vaccine Trivalent High Dose (Fluad)  Essential hypertension - Plan: lisinopril (ZESTRIL) 20 MG tablet, labetalol (NORMODYNE) 300 MG tablet At the present time, I will check a hemoglobin A1c.  His hemoglobin A1c is between  7 and 8 I will not make any additional changes to his medication unless he starts to check his blood sugar due to the risk of hypoglycemia.  I did give the patient another prescription for a continuous blood glucose meter.  We spent more than 20 minutes today discussing this.  I explained how this could help prevent hypoglycemia and better manage his blood sugar.  If he starts to check his sugar consistently and I would like to uptitrate the Trulicity.  His blood pressure today is excellent.  The patient received his flu shot.

## 2023-06-30 LAB — COMPLETE METABOLIC PANEL WITH GFR
AG Ratio: 1.6 (calc) (ref 1.0–2.5)
ALT: 12 U/L (ref 9–46)
AST: 10 U/L (ref 10–35)
Albumin: 3.9 g/dL (ref 3.6–5.1)
Alkaline phosphatase (APISO): 99 U/L (ref 35–144)
BUN/Creatinine Ratio: 14 (calc) (ref 6–22)
BUN: 24 mg/dL (ref 7–25)
CO2: 29 mmol/L (ref 20–32)
Calcium: 9.2 mg/dL (ref 8.6–10.3)
Chloride: 105 mmol/L (ref 98–110)
Creat: 1.72 mg/dL — ABNORMAL HIGH (ref 0.70–1.35)
Globulin: 2.5 g/dL (ref 1.9–3.7)
Glucose, Bld: 134 mg/dL — ABNORMAL HIGH (ref 65–99)
Potassium: 4.1 mmol/L (ref 3.5–5.3)
Sodium: 141 mmol/L (ref 135–146)
Total Bilirubin: 0.4 mg/dL (ref 0.2–1.2)
Total Protein: 6.4 g/dL (ref 6.1–8.1)
eGFR: 44 mL/min/{1.73_m2} — ABNORMAL LOW (ref >=60)

## 2023-06-30 LAB — CBC WITH DIFFERENTIAL/PLATELET
Absolute Lymphocytes: 1872 {cells}/uL (ref 850–3900)
Absolute Monocytes: 603 {cells}/uL (ref 200–950)
Basophils Absolute: 36 {cells}/uL (ref 0–200)
Basophils Relative: 0.4 %
Eosinophils Absolute: 729 {cells}/uL — ABNORMAL HIGH (ref 15–500)
Eosinophils Relative: 8.1 %
HCT: 38 % — ABNORMAL LOW (ref 38.5–50.0)
Hemoglobin: 12.4 g/dL — ABNORMAL LOW (ref 13.2–17.1)
MCH: 28.6 pg (ref 27.0–33.0)
MCHC: 32.6 g/dL (ref 32.0–36.0)
MCV: 87.8 fL (ref 80.0–100.0)
MPV: 10.1 fL (ref 7.5–12.5)
Monocytes Relative: 6.7 %
Neutro Abs: 5760 {cells}/uL (ref 1500–7800)
Neutrophils Relative %: 64 %
Platelets: 235 10*3/uL (ref 140–400)
RBC: 4.33 10*6/uL (ref 4.20–5.80)
RDW: 14.4 % (ref 11.0–15.0)
Total Lymphocyte: 20.8 %
WBC: 9 10*3/uL (ref 3.8–10.8)

## 2023-06-30 LAB — HEMOGLOBIN A1C
Hgb A1c MFr Bld: 7 %{Hb} — ABNORMAL HIGH (ref <5.7)
Mean Plasma Glucose: 154 mg/dL
eAG (mmol/L): 8.5 mmol/L

## 2023-07-09 ENCOUNTER — Other Ambulatory Visit: Payer: Self-pay | Admitting: Family Medicine

## 2023-07-09 DIAGNOSIS — I10 Essential (primary) hypertension: Secondary | ICD-10-CM

## 2023-07-13 ENCOUNTER — Other Ambulatory Visit: Payer: Self-pay | Admitting: Family Medicine

## 2023-07-13 ENCOUNTER — Telehealth: Payer: Self-pay

## 2023-07-13 DIAGNOSIS — I10 Essential (primary) hypertension: Secondary | ICD-10-CM

## 2023-07-13 NOTE — Telephone Encounter (Signed)
Form on Dr. Caren Macadam desk to sign. Mjp,lpn  Copied from CRM [id number]. Topic: Clinical - Prescription Issue >> Jul 13, 2023  9:15 AM Nada Libman H wrote: Reason for CRM: [facility] sent request on 12/16 and was verified on 12/17//Please call [phone]//Faxed ordered can be sent to [phone]

## 2023-07-16 NOTE — Telephone Encounter (Signed)
OV 06/29/23 Requested Prescriptions  Pending Prescriptions Disp Refills   amLODipine (NORVASC) 10 MG tablet [Pharmacy Med Name: amLODIPine Besylate 10 MG Oral Tablet] 30 tablet 0    Sig: TAKE 1 TABLET BY MOUTH ONCE DAILY ** COURTESY REFILL. NEEDS APPOINTMENT**     Cardiovascular: Calcium Channel Blockers 2 Failed - 07/16/2023 12:22 PM      Failed - Valid encounter within last 6 months    Recent Outpatient Visits           2 years ago Encounter for Medicare annual wellness exam   [facility] Donita Brooks, MD   2 years ago Uncontrolled type 2 diabetes mellitus with hyperglycemia (HCC)   [facility] Donita Brooks, MD   2 years ago Uncontrolled type 2 diabetes mellitus with hyperglycemia (HCC)   [facility] Donita Brooks, MD   3 years ago Uncontrolled type 2 diabetes mellitus with hyperglycemia (HCC)   [facility] Pickard, Priscille Heidelberg, MD   3 years ago Cervical radiculopathy   [facility] Pickard, Priscille Heidelberg, MD              Passed - Last BP in normal range    BP Readings from Last 1 Encounters:  06/29/23 132/76         Passed - Last Heart Rate in normal range    Pulse Readings from Last 1 Encounters:  06/29/23 63

## 2023-07-19 ENCOUNTER — Other Ambulatory Visit: Payer: Self-pay | Admitting: Family Medicine

## 2023-07-19 DIAGNOSIS — E7800 Pure hypercholesterolemia, unspecified: Secondary | ICD-10-CM

## 2023-07-19 DIAGNOSIS — Z794 Long term (current) use of insulin: Secondary | ICD-10-CM

## 2023-07-19 MED ORDER — ATORVASTATIN CALCIUM 40 MG PO TABS: 40.0000 mg | ORAL_TABLET | Freq: Every day | ORAL | 2 refills | Status: DC

## 2023-07-19 NOTE — Telephone Encounter (Signed)
Copied from CRM [id number]. Topic: Clinical - Medication Refill >> Jul 19, 2023 10:41 AM Gildardo Pounds wrote: Most Recent Primary Care Visit:  Provider: Lynnea Ferrier T  Department: [facility]  Visit Type: OFFICE VISIT  Date: 06/29/2023  Medication: ***  Has the patient contacted their pharmacy?  (Agent: If no, request that the patient contact the pharmacy for the refill. If patient does not wish to contact the pharmacy document the reason why and proceed with request.) (Agent: If yes, when and what did the pharmacy advise?)  Is this the correct pharmacy for this prescription?  If no, delete pharmacy and type the correct one.  This is the patient's preferred pharmacy:  [facility] [address], [place] - [address] [place] [address] Phone: [phone] Fax: [phone]   Has the prescription been filled recently?   Is the patient out of the medication?   Has the patient been seen for an appointment in the last year OR does the patient have an upcoming appointment?   Can we respond through MyChart?   Agent: Please be advised that Rx refills may take up to 3 business days. We ask that you follow-up with your pharmacy.

## 2023-07-23 DIAGNOSIS — E1151 Type 2 diabetes mellitus with diabetic peripheral angiopathy without gangrene: Secondary | ICD-10-CM | POA: Diagnosis not present

## 2023-07-23 DIAGNOSIS — B351 Tinea unguium: Secondary | ICD-10-CM | POA: Diagnosis not present

## 2023-07-23 DIAGNOSIS — E1122 Type 2 diabetes mellitus with diabetic chronic kidney disease: Secondary | ICD-10-CM | POA: Diagnosis not present

## 2023-07-23 DIAGNOSIS — I739 Peripheral vascular disease, unspecified: Secondary | ICD-10-CM | POA: Diagnosis not present

## 2023-07-23 DIAGNOSIS — I872 Venous insufficiency (chronic) (peripheral): Secondary | ICD-10-CM | POA: Diagnosis not present

## 2023-07-24 ENCOUNTER — Other Ambulatory Visit: Payer: Self-pay | Admitting: Family Medicine

## 2023-07-26 ENCOUNTER — Other Ambulatory Visit: Payer: Self-pay | Admitting: Family Medicine

## 2023-07-27 ENCOUNTER — Encounter: Payer: Self-pay | Admitting: Adult Health

## 2023-07-27 NOTE — Progress Notes (Signed)
 This encounter was created in error - please disregard.

## 2023-08-07 ENCOUNTER — Other Ambulatory Visit: Payer: Self-pay | Admitting: Family Medicine

## 2023-08-07 DIAGNOSIS — I10 Essential (primary) hypertension: Secondary | ICD-10-CM

## 2023-08-08 NOTE — Telephone Encounter (Signed)
 Requested Prescriptions  Pending Prescriptions Disp Refills   hydrALAZINE  (APRESOLINE ) 25 MG tablet [Pharmacy Med Name: hydrALAZINE  HCl 25 MG Oral Tablet] 180 tablet 0    Sig: TAKE 2 TABLETS BY MOUTH THREE TIMES DAILY     Cardiovascular:  Vasodilators Failed - 08/08/2023  8:55 AM      Failed - HCT in normal range and within 360 days    HCT  Date Value Ref Range Status  06/29/2023 38.0 (L) 38.5 - 50.0 % Final         Failed - HGB in normal range and within 360 days    Hemoglobin  Date Value Ref Range Status  06/29/2023 12.4 (L) 13.2 - 17.1 g/dL Final         Failed - ANA Screen, Ifa, Serum in normal range and within 360 days    No results found for: "ANA", "ANATITER", "LABANTI"       Failed - Valid encounter within last 12 months    Recent Outpatient Visits           2 years ago Encounter for Medicare annual wellness exam   [facility] Austine Lefort, MD   2 years ago Uncontrolled type 2 diabetes mellitus with hyperglycemia (HCC)   [facility] Pickard, Cisco Crest, MD   2 years ago Uncontrolled type 2 diabetes mellitus with hyperglycemia (HCC)   [facility] Pickard, Cisco Crest, MD   3 years ago Uncontrolled type 2 diabetes mellitus with hyperglycemia (HCC)   [facility] Pickard, Cisco Crest, MD   3 years ago Cervical radiculopathy   [facility] Pickard, Cisco Crest, MD              Passed - RBC in normal range and within 360 days    RBC  Date Value Ref Range Status  06/29/2023 4.33 4.20 - 5.80 Million/uL Final         Passed - WBC in normal range and within 360 days    WBC  Date Value Ref Range Status  06/29/2023 9.0 3.8 - 10.8 Thousand/uL Final         Passed - PLT in normal range and within 360 days    Platelets  Date Value Ref Range Status  06/29/2023 235 140 - 400 Thousand/uL Final         Passed - Last BP in normal range    BP Readings from Last 1 Encounters:  06/29/23 132/76

## 2023-08-13 ENCOUNTER — Telehealth: Payer: Self-pay

## 2023-08-13 NOTE — Telephone Encounter (Signed)
Copied from CRM [id number]. Topic: Clinical - Prescription Issue >> Aug 13, 2023 11:23 AM Gildardo Pounds wrote: Reason for CRM: Trixie Dredge, niece, received email that Dr Tanya Nones has not filled out form regarding Trulicity from [facility].  Callback number is [phone]

## 2023-08-22 ENCOUNTER — Other Ambulatory Visit: Payer: Self-pay | Admitting: Family Medicine

## 2023-09-05 ENCOUNTER — Other Ambulatory Visit: Payer: Self-pay | Admitting: Family Medicine

## 2023-09-05 DIAGNOSIS — I10 Essential (primary) hypertension: Secondary | ICD-10-CM

## 2023-09-07 ENCOUNTER — Other Ambulatory Visit: Payer: Self-pay | Admitting: Family Medicine

## 2023-09-07 NOTE — Telephone Encounter (Signed)
Requested medication (s) are due for refill today: expired medication   Requested medication (s) are on the active medication list: yes   Last refill:  06/19/22 #90 3 refills   Future visit scheduled: no   Notes to clinic:  expired medication . Do you want to renew Rx?     Requested Prescriptions  Pending Prescriptions Disp Refills   potassium chloride SA (KLOR-CON M20) 20 MEQ tablet 90 tablet 0    Sig: TAKE 1 TABLET BY MOUTH ONCE DAILY . APPOINTMENT REQUIRED FOR FUTURE REFILLS     Endocrinology:  Minerals - Potassium Supplementation Failed - 09/07/2023  4:24 PM      Failed - Cr in normal range and within 360 days    Creat  Date Value Ref Range Status  06/29/2023 1.72 (H) 0.70 - 1.35 mg/dL Final   Creatinine, Urine  Date Value Ref Range Status  06/29/2015 117 20 - 370 mg/dL Final         Failed - Valid encounter within last 12 months    Recent Outpatient Visits           2 years ago Encounter for Medicare annual wellness exam   [facility] Donita Brooks, MD   2 years ago Uncontrolled type 2 diabetes mellitus with hyperglycemia (HCC)   [facility] Donita Brooks, MD   3 years ago Uncontrolled type 2 diabetes mellitus with hyperglycemia (HCC)   [facility] Donita Brooks, MD   3 years ago Uncontrolled type 2 diabetes mellitus with hyperglycemia (HCC)   [facility] Pickard, Priscille Heidelberg, MD   3 years ago Cervical radiculopathy   [facility] Pickard, Priscille Heidelberg, MD              Passed - K in normal range and within 360 days    Potassium  Date Value Ref Range Status  06/29/2023 4.1 3.5 - 5.3 mmol/L Final

## 2023-09-10 ENCOUNTER — Other Ambulatory Visit: Payer: Self-pay | Admitting: Family Medicine

## 2023-09-10 DIAGNOSIS — N1831 Chronic kidney disease, stage 3a: Secondary | ICD-10-CM

## 2023-09-10 DIAGNOSIS — I10 Essential (primary) hypertension: Secondary | ICD-10-CM

## 2023-09-14 ENCOUNTER — Other Ambulatory Visit: Payer: Self-pay | Admitting: Family Medicine

## 2023-09-14 ENCOUNTER — Ambulatory Visit: Payer: Medicare HMO | Admitting: Family Medicine

## 2023-09-14 MED ORDER — INSULIN GLARGINE 100 UNIT/ML SOLOSTAR PEN: 58.0000 [IU] | PEN_INJECTOR | Freq: Every day | SUBCUTANEOUS | 3 refills | Status: AC

## 2023-09-19 ENCOUNTER — Telehealth: Payer: Self-pay

## 2023-09-19 NOTE — Telephone Encounter (Signed)
 Toniann Fail, pt's niece and POA, has called and states pt received Hospital doctor from [employer] patient assistance. Patient is not currently on Basaglar and only uses Lantus and Trulicity. Toniann Fail asks if the patient is supposed to be on the Basaglar along with the other two medications? Thank you.

## 2023-09-19 NOTE — Telephone Encounter (Signed)
 Copied from CRM [id number]. Topic: General - Other >> Sep 18, 2023  3:22 PM Fonda Kinder J wrote: Reason for CRM: Trixie Dredge called in requesting to speak with a nurse, she states she has a few questions bout the pt  [phone]

## 2023-09-30 ENCOUNTER — Other Ambulatory Visit: Payer: Self-pay | Admitting: Family Medicine

## 2023-09-30 DIAGNOSIS — I10 Essential (primary) hypertension: Secondary | ICD-10-CM

## 2023-10-02 NOTE — Telephone Encounter (Signed)
 Requested medication (s) are due for refill today: yes  Requested medication (s) are on the active medication list: yes  Last refill:  08/22/23 #60/0  Future visit scheduled: no  Notes to clinic:  Unable to refill per protocol due to failed labs, no updated results.      Requested Prescriptions  Pending Prescriptions Disp Refills   allopurinol (ZYLOPRIM) 100 MG tablet [Pharmacy Med Name: Allopurinol 100 MG Oral Tablet] 60 tablet 0    Sig: Take 1 tablet by mouth twice daily     Endocrinology:  Gout Agents - allopurinol Failed - 10/02/2023 10:22 AM      Failed - Uric Acid in normal range and within 360 days    No results found for: "POCURA", "LABURIC"       Failed - Cr in normal range and within 360 days    Creat  Date Value Ref Range Status  06/29/2023 1.72 (H) 0.70 - 1.35 mg/dL Final   Creatinine, Urine  Date Value Ref Range Status  06/29/2015 117 20 - 370 mg/dL Final         Failed - Valid encounter within last 12 months    Recent Outpatient Visits           2 years ago Encounter for Medicare annual wellness exam   [facility] Donita Brooks, MD   2 years ago Uncontrolled type 2 diabetes mellitus with hyperglycemia (HCC)   [facility] Pickard, Priscille Heidelberg, MD   3 years ago Uncontrolled type 2 diabetes mellitus with hyperglycemia (HCC)   [facility] Pickard, Priscille Heidelberg, MD   3 years ago Uncontrolled type 2 diabetes mellitus with hyperglycemia (HCC)   [facility] Pickard, Priscille Heidelberg, MD   3 years ago Cervical radiculopathy   [facility] Pickard, Priscille Heidelberg, MD              Passed - CBC within normal limits and completed in the last 12 months    WBC  Date Value Ref Range Status  06/29/2023 9.0 3.8 - 10.8 Thousand/uL Final   RBC  Date Value Ref Range Status  06/29/2023 4.33 4.20 - 5.80 Million/uL Final   Hemoglobin  Date Value Ref Range Status  06/29/2023 12.4 (L) 13.2 - 17.1 g/dL  Final   HCT  Date Value Ref Range Status  06/29/2023 38.0 (L) 38.5 - 50.0 % Final   MCHC  Date Value Ref Range Status  06/29/2023 32.6 32.0 - 36.0 g/dL Final    Comment:    For adults, a slight decrease in the calculated MCHC value (in the range of 30 to 32 g/dL) is most likely not clinically significant; however, it should be interpreted with caution in correlation with other red cell parameters and the patient's clinical condition.    [facility]  Date Value Ref Range Status  06/29/2023 28.6 27.0 - 33.0 pg Final   MCV  Date Value Ref Range Status  06/29/2023 87.8 80.0 - 100.0 fL Final   No results found for: "PLTCOUNTKUC", "LABPLAT", "POCPLA" RDW  Date Value Ref Range Status  06/29/2023 14.4 11.0 - 15.0 % Final         Signed Prescriptions Disp Refills   amLODipine (NORVASC) 10 MG tablet 90 tablet 0    Sig: Take 1 tablet by mouth once daily     Cardiovascular: Calcium Channel Blockers 2 Failed - 10/02/2023 10:22 AM      Failed - Valid encounter within  last 6 months    Recent Outpatient Visits           2 years ago Encounter for Medicare annual wellness exam   [facility] Donita Brooks, MD   2 years ago Uncontrolled type 2 diabetes mellitus with hyperglycemia (HCC)   [facility] Donita Brooks, MD   3 years ago Uncontrolled type 2 diabetes mellitus with hyperglycemia (HCC)   [facility] Donita Brooks, MD   3 years ago Uncontrolled type 2 diabetes mellitus with hyperglycemia (HCC)   [facility] Pickard, Priscille Heidelberg, MD   3 years ago Cervical radiculopathy   [facility] Pickard, Priscille Heidelberg, MD              Passed - Last BP in normal range    BP Readings from Last 1 Encounters:  06/29/23 132/76         Passed - Last Heart Rate in normal range    Pulse Readings from Last 1 Encounters:  06/29/23 63

## 2023-10-02 NOTE — Telephone Encounter (Signed)
 Requested Prescriptions  Pending Prescriptions Disp Refills   amLODipine (NORVASC) 10 MG tablet [Pharmacy Med Name: amLODIPine Besylate 10 MG Oral Tablet] 90 tablet 0    Sig: Take 1 tablet by mouth once daily     Cardiovascular: Calcium Channel Blockers 2 Failed - 10/02/2023 10:18 AM      Failed - Valid encounter within last 6 months    Recent Outpatient Visits           2 years ago Encounter for Medicare annual wellness exam   [facility] Donita Brooks, MD   2 years ago Uncontrolled type 2 diabetes mellitus with hyperglycemia (HCC)   [facility] Pickard, Priscille Heidelberg, MD   3 years ago Uncontrolled type 2 diabetes mellitus with hyperglycemia (HCC)   [facility] Pickard, Priscille Heidelberg, MD   3 years ago Uncontrolled type 2 diabetes mellitus with hyperglycemia (HCC)   [facility] Pickard, Priscille Heidelberg, MD   3 years ago Cervical radiculopathy   [facility] Pickard, Priscille Heidelberg, MD              Passed - Last BP in normal range    BP Readings from Last 1 Encounters:  06/29/23 132/76         Passed - Last Heart Rate in normal range    Pulse Readings from Last 1 Encounters:  06/29/23 63          allopurinol (ZYLOPRIM) 100 MG tablet [Pharmacy Med Name: Allopurinol 100 MG Oral Tablet] 60 tablet 0    Sig: Take 1 tablet by mouth twice daily     Endocrinology:  Gout Agents - allopurinol Failed - 10/02/2023 10:18 AM      Failed - Uric Acid in normal range and within 360 days    No results found for: "POCURA", "LABURIC"       Failed - Cr in normal range and within 360 days    Creat  Date Value Ref Range Status  06/29/2023 1.72 (H) 0.70 - 1.35 mg/dL Final   Creatinine, Urine  Date Value Ref Range Status  06/29/2015 117 20 - 370 mg/dL Final         Failed - Valid encounter within last 12 months    Recent Outpatient Visits           2 years ago Encounter for Medicare annual wellness exam   [facility] Donita Brooks, MD   2 years ago Uncontrolled type 2 diabetes mellitus with hyperglycemia (HCC)   [facility] Donita Brooks, MD   3 years ago Uncontrolled type 2 diabetes mellitus with hyperglycemia (HCC)   [facility] Donita Brooks, MD   3 years ago Uncontrolled type 2 diabetes mellitus with hyperglycemia (HCC)   [facility] Pickard, Priscille Heidelberg, MD   3 years ago Cervical radiculopathy   [facility] Pickard, Priscille Heidelberg, MD              Passed - CBC within normal limits and completed in the last 12 months    WBC  Date Value Ref Range Status  06/29/2023 9.0 3.8 - 10.8 Thousand/uL Final   RBC  Date Value Ref Range Status  06/29/2023 4.33 4.20 - 5.80 Million/uL Final   Hemoglobin  Date Value Ref Range Status  06/29/2023 12.4 (L) 13.2 - 17.1 g/dL Final   HCT  Date Value Ref Range Status  06/29/2023 38.0 (L) 38.5 - 50.0 % Final   MCHC  Date Value Ref Range Status  06/29/2023 32.6 32.0 - 36.0 g/dL Final    Comment:    For adults, a slight decrease in the calculated MCHC value (in the range of 30 to 32 g/dL) is most likely not clinically significant; however, it should be interpreted with caution in correlation with other red cell parameters and the patient's clinical condition.    [facility]  Date Value Ref Range Status  06/29/2023 28.6 27.0 - 33.0 pg Final   MCV  Date Value Ref Range Status  06/29/2023 87.8 80.0 - 100.0 fL Final   No results found for: "PLTCOUNTKUC", "LABPLAT", "POCPLA" RDW  Date Value Ref Range Status  06/29/2023 14.4 11.0 - 15.0 % Final

## 2023-10-04 ENCOUNTER — Other Ambulatory Visit: Payer: Self-pay | Admitting: Family Medicine

## 2023-10-04 DIAGNOSIS — I10 Essential (primary) hypertension: Secondary | ICD-10-CM

## 2023-10-04 NOTE — Telephone Encounter (Signed)
 Requested Prescriptions  Pending Prescriptions Disp Refills   hydrALAZINE (APRESOLINE) 25 MG tablet [Pharmacy Med Name: hydrALAZINE HCl 25 MG Oral Tablet] 180 tablet 2    Sig: TAKE 2 TABLETS BY MOUTH THREE TIMES DAILY     Cardiovascular:  Vasodilators Failed - 10/04/2023  1:29 PM      Failed - HCT in normal range and within 360 days    HCT  Date Value Ref Range Status  06/29/2023 38.0 (L) 38.5 - 50.0 % Final         Failed - HGB in normal range and within 360 days    Hemoglobin  Date Value Ref Range Status  06/29/2023 12.4 (L) 13.2 - 17.1 g/dL Final         Failed - ANA Screen, Ifa, Serum in normal range and within 360 days    No results found for: "ANA", "ANATITER", "LABANTI"       Failed - Valid encounter within last 12 months    Recent Outpatient Visits           2 years ago Encounter for Medicare annual wellness exam   [facility] Donita Brooks, MD   2 years ago Uncontrolled type 2 diabetes mellitus with hyperglycemia (HCC)   [facility] Pickard, Priscille Heidelberg, MD   3 years ago Uncontrolled type 2 diabetes mellitus with hyperglycemia (HCC)   [facility] Pickard, Priscille Heidelberg, MD   3 years ago Uncontrolled type 2 diabetes mellitus with hyperglycemia (HCC)   [facility] Pickard, Priscille Heidelberg, MD   3 years ago Cervical radiculopathy   [facility] Pickard, Priscille Heidelberg, MD              Passed - RBC in normal range and within 360 days    RBC  Date Value Ref Range Status  06/29/2023 4.33 4.20 - 5.80 Million/uL Final         Passed - WBC in normal range and within 360 days    WBC  Date Value Ref Range Status  06/29/2023 9.0 3.8 - 10.8 Thousand/uL Final         Passed - PLT in normal range and within 360 days    Platelets  Date Value Ref Range Status  06/29/2023 235 140 - 400 Thousand/uL Final         Passed - Last BP in normal range    BP Readings from Last 1 Encounters:  06/29/23 132/76

## 2023-10-27 ENCOUNTER — Other Ambulatory Visit: Payer: Self-pay | Admitting: Family Medicine

## 2023-10-29 NOTE — Telephone Encounter (Signed)
 Requested Prescriptions  Pending Prescriptions Disp Refills   allopurinol (ZYLOPRIM) 100 MG tablet [Pharmacy Med Name: Allopurinol 100 MG Oral Tablet] 180 tablet 0    Sig: Take 1 tablet by mouth twice daily     Endocrinology:  Gout Agents - allopurinol Failed - 10/29/2023  3:44 PM      Failed - Uric Acid in normal range and within 360 days    No results found for: "POCURA", "LABURIC"       Failed - Cr in normal range and within 360 days    Creat  Date Value Ref Range Status  06/29/2023 1.72 (H) 0.70 - 1.35 mg/dL Final   Creatinine, Urine  Date Value Ref Range Status  06/29/2015 117 20 - 370 mg/dL Final         Passed - Valid encounter within last 12 months    Recent Outpatient Visits           4 months ago Type 2 diabetes mellitus with stage 3a chronic kidney disease, with long-term current use of insulin (HCC)   [facility] Pickard, Priscille Heidelberg, MD   11 months ago Type 2 diabetes mellitus with stage 3a chronic kidney disease, with long-term current use of insulin (HCC)   [place] [facility] Pickard, Priscille Heidelberg, MD   1 year ago General medical exam   [place] [facility] Donita Brooks, MD   11 years ago Cellulitis, leg   [facility], Harrel Lemon, MD              Passed - CBC within normal limits and completed in the last 12 months    WBC  Date Value Ref Range Status  06/29/2023 9.0 3.8 - 10.8 Thousand/uL Final   RBC  Date Value Ref Range Status  06/29/2023 4.33 4.20 - 5.80 Million/uL Final   Hemoglobin  Date Value Ref Range Status  06/29/2023 12.4 (L) 13.2 - 17.1 g/dL Final   HCT  Date Value Ref Range Status  06/29/2023 38.0 (L) 38.5 - 50.0 % Final   MCHC  Date Value Ref Range Status  06/29/2023 32.6 32.0 - 36.0 g/dL Final    Comment:    For adults, a slight decrease in the calculated MCHC value (in the range of 30 to 32 g/dL) is most likely not clinically significant;  however, it should be interpreted with caution in correlation with other red cell parameters and the patient's clinical condition.    [facility]  Date Value Ref Range Status  06/29/2023 28.6 27.0 - 33.0 pg Final   MCV  Date Value Ref Range Status  06/29/2023 87.8 80.0 - 100.0 fL Final   No results found for: "PLTCOUNTKUC", "LABPLAT", "POCPLA" RDW  Date Value Ref Range Status  06/29/2023 14.4 11.0 - 15.0 % Final

## 2023-12-08 ENCOUNTER — Other Ambulatory Visit: Payer: Self-pay | Admitting: Family Medicine

## 2023-12-08 DIAGNOSIS — N1831 Chronic kidney disease, stage 3a: Secondary | ICD-10-CM

## 2023-12-08 DIAGNOSIS — I10 Essential (primary) hypertension: Secondary | ICD-10-CM

## 2023-12-10 ENCOUNTER — Other Ambulatory Visit: Payer: Self-pay | Admitting: Family Medicine

## 2023-12-11 NOTE — Telephone Encounter (Signed)
 OV 06/29/23 Requested Prescriptions  Pending Prescriptions Disp Refills   doxazosin  (CARDURA ) 4 MG tablet [Pharmacy Med Name: Doxazosin  Mesylate 4 MG Oral Tablet] 90 tablet 0    Sig: Take 1 tablet by mouth once daily     Cardiovascular:  Alpha Blockers Failed - 12/11/2023 11:45 AM      Failed - Valid encounter within last 6 months    Recent Outpatient Visits           5 months ago Type 2 diabetes mellitus with stage 3a chronic kidney disease, with long-term current use of insulin  (HCC)   [facility] Austine Lefort, MD   1 year ago Type 2 diabetes mellitus with stage 3a chronic kidney disease, with long-term current use of insulin  [facility])   [place] [facility] Pickard, Cisco Crest, MD   1 year ago General medical exam   [place] [facility] Austine Lefort, MD   11 years ago Cellulitis, leg   Primary Care at [facility], Renata [place], MD              Passed - Last BP in normal range    BP Readings from Last 1 Encounters:  06/29/23 132/76          24

## 2023-12-12 NOTE — Telephone Encounter (Signed)
 Last OV 06/29/23.  Requested Prescriptions  Pending Prescriptions Disp Refills   potassium chloride  SA (KLOR-CON  M) 20 MEQ tablet [Pharmacy Med Name: Potassium Chloride  Crys ER 20 MEQ Oral Tablet Extended Release] 90 tablet 0    Sig: TAKE 1 TABLET BY MOUTH ONCE DAILY --APPOINTMENT  REQUIRED  FOR  FUTURE  REFILLS     Endocrinology:  Minerals - Potassium Supplementation Failed - 12/12/2023 10:18 AM      Failed - Cr in normal range and within 360 days    Creat  Date Value Ref Range Status  06/29/2023 1.72 (H) 0.70 - 1.35 mg/dL Final   Creatinine, Urine  Date Value Ref Range Status  06/29/2015 117 20 - 370 mg/dL Final         Failed - Valid encounter within last 12 months    Recent Outpatient Visits           5 months ago Type 2 diabetes mellitus with stage 3a chronic kidney disease, with long-term current use of insulin  (HCC)   [facility] Austine Lefort, MD   1 year ago Type 2 diabetes mellitus with stage 3a chronic kidney disease, with long-term current use of insulin  [facility])   [place] [facility] Pickard, Cisco Crest, MD   1 year ago General medical exam   [place] [facility] Austine Lefort, MD   11 years ago Cellulitis, leg   [facility] Care at [facility], Renata [place], MD              Passed - K in normal range and within 360 days    Potassium  Date Value Ref Range Status  06/29/2023 4.1 3.5 - 5.3 mmol/L Final

## 2023-12-24 DIAGNOSIS — E1122 Type 2 diabetes mellitus with diabetic chronic kidney disease: Secondary | ICD-10-CM | POA: Diagnosis not present

## 2023-12-26 ENCOUNTER — Other Ambulatory Visit: Payer: Self-pay | Admitting: Family Medicine

## 2023-12-26 DIAGNOSIS — I10 Essential (primary) hypertension: Secondary | ICD-10-CM

## 2024-01-12 ENCOUNTER — Other Ambulatory Visit: Payer: Self-pay | Admitting: Family Medicine

## 2024-01-12 DIAGNOSIS — I10 Essential (primary) hypertension: Secondary | ICD-10-CM

## 2024-01-17 DIAGNOSIS — H35033 Hypertensive retinopathy, bilateral: Secondary | ICD-10-CM | POA: Diagnosis not present

## 2024-01-17 DIAGNOSIS — E119 Type 2 diabetes mellitus without complications: Secondary | ICD-10-CM | POA: Diagnosis not present

## 2024-01-17 DIAGNOSIS — H5203 Hypermetropia, bilateral: Secondary | ICD-10-CM | POA: Diagnosis not present

## 2024-01-17 DIAGNOSIS — H35721 Serous detachment of retinal pigment epithelium, right eye: Secondary | ICD-10-CM | POA: Diagnosis not present

## 2024-01-17 DIAGNOSIS — H2513 Age-related nuclear cataract, bilateral: Secondary | ICD-10-CM | POA: Diagnosis not present

## 2024-01-17 DIAGNOSIS — H52223 Regular astigmatism, bilateral: Secondary | ICD-10-CM | POA: Diagnosis not present

## 2024-01-17 DIAGNOSIS — H524 Presbyopia: Secondary | ICD-10-CM | POA: Diagnosis not present

## 2024-01-17 LAB — HM DIABETES EYE EXAM

## 2024-01-19 ENCOUNTER — Other Ambulatory Visit: Payer: Self-pay | Admitting: Family Medicine

## 2024-01-19 DIAGNOSIS — I10 Essential (primary) hypertension: Secondary | ICD-10-CM

## 2024-02-18 ENCOUNTER — Other Ambulatory Visit: Payer: Self-pay | Admitting: Family Medicine

## 2024-02-18 DIAGNOSIS — I10 Essential (primary) hypertension: Secondary | ICD-10-CM

## 2024-02-29 ENCOUNTER — Other Ambulatory Visit: Payer: Self-pay | Admitting: Family Medicine

## 2024-02-29 DIAGNOSIS — I10 Essential (primary) hypertension: Secondary | ICD-10-CM

## 2024-03-03 ENCOUNTER — Telehealth: Payer: Self-pay | Admitting: Family Medicine

## 2024-03-03 ENCOUNTER — Other Ambulatory Visit: Payer: Self-pay

## 2024-03-03 DIAGNOSIS — N1831 Chronic kidney disease, stage 3a: Secondary | ICD-10-CM

## 2024-03-03 DIAGNOSIS — I10 Essential (primary) hypertension: Secondary | ICD-10-CM

## 2024-03-03 MED ORDER — POTASSIUM CHLORIDE CRYS ER 20 MEQ PO TBCR: 20.0000 meq | EXTENDED_RELEASE_TABLET | Freq: Every day | ORAL | 0 refills | Status: DC

## 2024-03-03 MED ORDER — DOXAZOSIN MESYLATE 4 MG PO TABS: 4.0000 mg | ORAL_TABLET | Freq: Every day | ORAL | 0 refills | Status: DC

## 2024-03-03 MED ORDER — HYDRALAZINE HCL 25 MG PO TABS: 50.0000 mg | ORAL_TABLET | Freq: Three times a day (TID) | ORAL | 0 refills | Status: DC

## 2024-03-03 NOTE — Telephone Encounter (Unsigned)
 Copied from CRM [id number]. Topic: Clinical - Prescription Issue >> Mar 03, 2024  4:27 PM Tiffini S wrote: Reason for CRM: Patient niece Sari called stating that the pharmacy have requested a refill for hydrALAZINE  (APRESOLINE ) 25 MG tablet was denied- patient is out of medication and have been for two weeks. Was last filled on June 30th for 180 tablets. Please call at [phone].

## 2024-03-14 ENCOUNTER — Ambulatory Visit (INDEPENDENT_AMBULATORY_CARE_PROVIDER_SITE_OTHER): Admitting: Family Medicine

## 2024-03-14 ENCOUNTER — Encounter: Payer: Self-pay | Admitting: Family Medicine

## 2024-03-14 VITALS — BP 146/78 | HR 61 | Ht 76.0 in | Wt 280.0 lb

## 2024-03-14 DIAGNOSIS — Z794 Long term (current) use of insulin: Secondary | ICD-10-CM | POA: Diagnosis not present

## 2024-03-14 DIAGNOSIS — Z23 Encounter for immunization: Secondary | ICD-10-CM

## 2024-03-14 DIAGNOSIS — E7800 Pure hypercholesterolemia, unspecified: Secondary | ICD-10-CM

## 2024-03-14 DIAGNOSIS — N1831 Chronic kidney disease, stage 3a: Secondary | ICD-10-CM | POA: Diagnosis not present

## 2024-03-14 DIAGNOSIS — E1122 Type 2 diabetes mellitus with diabetic chronic kidney disease: Secondary | ICD-10-CM

## 2024-03-14 NOTE — Progress Notes (Signed)
 Subjective:    Patient ID: Eduardo Young, male    DOB: April 19, 1956, 68 y.o.   MRN: [id number] Patient is here today for checkup.  He has a history of insulin -dependent diabetes mellitus.  He is currently on 58 units of Basaglar  once daily.  Patient has continuous blood glucose monitoring.  However he does not have his meter for me to review today.  He states that he is typically between the 2 bar.  He states that he seldomly below.  He occasionally has hypoglycemia but he states that this is rare.  He also states that he tends to get hide in the evenings after dinner.  However he is unable to quantify what that means to me.  He denies any polyuria polydipsia or blurry vision.  He denies any chest pain or shortness of breath. Past Medical History:  Diagnosis Date   Chronic venous insufficiency    Diabetes mellitus    GERD (gastroesophageal reflux disease)    Gout    Hemiparesis (HCC)    Hyperlipidemia    Hypertension    Obesity    Renal calculi    Sleep apnea, obstructive    Stroke [facility])    [id number]   Past Surgical History:  Procedure Laterality Date   BIOPSY  09/06/2018   Procedure: BIOPSY;  Surgeon: Albertus Gordy HERO, MD;  Location: [facility];  Service: Gastroenterology;;   COLONOSCOPY WITH PROPOFOL  N/A 09/06/2018   Procedure: COLONOSCOPY WITH PROPOFOL ;  Surgeon: Albertus Gordy HERO, MD;  Location: [facility];  Service: Gastroenterology;  Laterality: N/A;   LITHOTRIPSY     NASAL POLYP EXCISION     Current Outpatient Medications on File Prior to Visit  Medication Sig Dispense Refill   ACCU-CHEK FASTCLIX LANCETS MISC Check BS BID DX - E11.9 200 each 3   allopurinol  (ZYLOPRIM ) 100 MG tablet Take 1 tablet by mouth twice daily 180 tablet 0   amLODipine  (NORVASC ) 10 MG tablet Take 1 tablet by mouth once daily 90 tablet 0   aspirin  81 MG tablet Take 81 mg by mouth every evening.      atorvastatin  (LIPITOR) 40 MG tablet Take 1 tablet (40 mg total) by mouth daily. 90 tablet 2   baclofen   (LIORESAL ) 10 MG tablet Take 0.5-1 tablets (5-10 mg total) by mouth 3 (three) times daily as needed for muscle spasms. 30 each 3   bisacodyl  (BISACODYL ) 5 MG EC tablet Take 1 tablet (5 mg total) by mouth daily as needed for moderate constipation. 30 tablet 0   Blood Glucose Monitoring Suppl (ACCU-CHEK AVIVA PLUS) w/Device KIT Check BS BID DX - E11.9 1 kit 1   cloNIDine  (CATAPRES ) 0.2 MG tablet Take 1 tablet by mouth twice daily 180 tablet 1   Continuous Glucose Receiver (FREESTYLE LIBRE 2 READER) DEVI 1 Device by Does not apply route daily at 2 am. 1 each 1   Continuous Glucose Sensor (FREESTYLE LIBRE 2 SENSOR) MISC 1 Device by Does not apply route every 14 (fourteen) days. 2 each 11   doxazosin  (CARDURA ) 4 MG tablet Take 1 tablet (4 mg total) by mouth daily. 90 tablet 0   glucose blood (ACCU-CHEK AVIVA) test strip Check BS BID DX - E11.9 150 each 3   hydrALAZINE  (APRESOLINE ) 25 MG tablet Take 2 tablets (50 mg total) by mouth 3 (three) times daily. 180 tablet 0   insulin  glargine (LANTUS ) 100 UNIT/ML Solostar Pen Inject 58 Units into the skin daily. 45 mL 3   labetalol  (NORMODYNE ) 300 MG  tablet Take 1 tablet (300 mg total) by mouth 2 (two) times daily. 180 tablet 3   Lancets Misc. (ACCU-CHEK FASTCLIX LANCET) KIT Check BS BID DX - E11.9 1 kit 1   lisinopril  (ZESTRIL ) 20 MG tablet Take 1 tablet (20 mg total) by mouth 2 (two) times daily. 180 tablet 3   Multiple Vitamin (MULTIVITAMIN WITH MINERALS) TABS tablet Take 1 tablet by mouth daily.     polyethylene glycol powder (GLYCOLAX /MIRALAX ) powder Take 17 g by mouth daily. 510 g 11   potassium chloride  SA (KLOR-CON  M) 20 MEQ tablet Take 1 tablet (20 mEq total) by mouth daily. 90 tablet 0   silver  sulfADIAZINE  (SILVADENE ) 1 % cream Apply 1 Application topically daily. 50 g 0   traMADol  (ULTRAM ) 50 MG tablet TAKE 1 TABLET BY MOUTH EVERY 6 HOURS AS NEEDED 30 tablet 0   TRULICITY  0.75 MG/0.5ML SOAJ INJECT 0.75MG  (0.5ML) UNDER THE SKIN ONCE A WEEK. 2 mL 0    No current facility-administered medications on file prior to visit.   No Known Allergies Social History   Socioeconomic History   Marital status: Single    Spouse name: Not on file   Number of children: Not on file   Years of education: Not on file   Highest education level: Not on file  Occupational History   Not on file  Tobacco Use   Smoking status: Former   Smokeless tobacco: Never  Vaping Use   Vaping status: Never Used  Substance and Sexual Activity   Alcohol use: Yes    Comment: Occasional   Drug use: Not Currently    Types: Cocaine, Marijuana   Sexual activity: Not on file  Other Topics Concern   Not on file  Social History Narrative   Not on file   Social Drivers of Health   Financial Resource Strain: Medium Risk (03/24/2020)   Overall Financial Resource Strain (CARDIA)    Difficulty of Paying Living Expenses: Somewhat hard  Food Insecurity: Not on file  Transportation Needs: Not on file  Physical Activity: Not on file  Stress: Not on file  Social Connections: Not on file  Intimate Partner Violence: Not on file      Review of Systems  All other systems reviewed and are negative.      Objective:   Physical Exam Vitals reviewed.  Constitutional:      General: He is not in acute distress.    Appearance: He is well-developed. He is obese. He is not ill-appearing, toxic-appearing or diaphoretic.  HENT:     Head: Normocephalic and atraumatic.     Nose: No congestion or rhinorrhea.     Mouth/Throat:     Mouth: Mucous membranes are moist.     Pharynx: No oropharyngeal exudate or posterior oropharyngeal erythema.  Eyes:     General: No scleral icterus.    Extraocular Movements: Extraocular movements intact.     Conjunctiva/sclera: Conjunctivae normal.     Pupils: Pupils are equal, round, and reactive to light.  Neck:     Vascular: No carotid bruit.  Cardiovascular:     Rate and Rhythm: Normal rate and regular rhythm.     Heart sounds: Normal  heart sounds. No murmur heard.    No friction rub. No gallop.  Pulmonary:     Effort: Pulmonary effort is normal. No respiratory distress.     Breath sounds: Normal breath sounds. No wheezing or rales.  Chest:     Chest wall: No tenderness.  Abdominal:  General: Bowel sounds are normal. There is no distension.     Palpations: Abdomen is soft.     Tenderness: There is no abdominal tenderness. There is no guarding or rebound.  Musculoskeletal:     Cervical back: Neck supple. No tenderness.     Right lower leg: Edema present.     Left lower leg: Edema present.  Lymphadenopathy:     Cervical: No cervical adenopathy.  Skin:    Coloration: Skin is not jaundiced.     Findings: No bruising or erythema.  Neurological:     Mental Status: He is alert and oriented to person, place, and time. Mental status is at baseline.     Motor: Weakness present.     Coordination: Coordination abnormal.     Gait: Gait abnormal.  Psychiatric:        Mood and Affect: Mood normal.        Behavior: Behavior normal.        Thought Content: Thought content normal.        Judgment: Judgment normal.       Chronic kidney disease, stage 3a (HCC)  Type 2 diabetes mellitus with stage 3a chronic kidney disease, with long-term current use of insulin  (HCC) - Plan: CBC with Differential/Platelet, Comprehensive metabolic panel with GFR, Lipid panel, Hemoglobin A1c  Pure hypercholesterolemia  Need for vaccination - Plan: Varicella-zoster vaccine IM Ideally would like to see his A1c below 7.  If well below 6.5 we may need to reduce his insulin .  Given his inconsistent sugar monitoring, I would be willing to except a hemoglobin A1c less than 8.  Blood pressure today is slightly elevated.  I have asked the patient to purchase a cuff to monitor his blood pressure at home.  If consistently greater than 140/90 I would increase his clonidine .  Meanwhile check CBC CMP and fasting lipid panel.  Goal LDL cholesterol is less  than 70 given his history of CVA

## 2024-03-15 LAB — CBC WITH DIFFERENTIAL/PLATELET
Absolute Lymphocytes: 2029 {cells}/uL (ref 850–3900)
Absolute Monocytes: 568 {cells}/uL (ref 200–950)
Basophils Absolute: 39 {cells}/uL (ref 0–200)
Basophils Relative: 0.4 %
Eosinophils Absolute: 715 {cells}/uL — ABNORMAL HIGH (ref 15–500)
Eosinophils Relative: 7.3 %
HCT: 37.3 % — ABNORMAL LOW (ref 38.5–50.0)
Hemoglobin: 13.2 g/dL (ref 13.2–17.1)
MCH: 31.5 pg (ref 27.0–33.0)
MCHC: 35.4 g/dL (ref 32.0–36.0)
MCV: 89 fL (ref 80.0–100.0)
MPV: 10 fL (ref 7.5–12.5)
Monocytes Relative: 5.8 %
Neutro Abs: 6448 {cells}/uL (ref 1500–7800)
Neutrophils Relative %: 65.8 %
Platelets: 273 Thousand/uL (ref 140–400)
RBC: 4.19 Million/uL — ABNORMAL LOW (ref 4.20–5.80)
RDW: 13.8 % (ref 11.0–15.0)
Total Lymphocyte: 20.7 %
WBC: 9.8 Thousand/uL (ref 3.8–10.8)

## 2024-03-15 LAB — LIPID PANEL
Cholesterol: 118 mg/dL (ref <200)
HDL: 24 mg/dL — ABNORMAL LOW (ref >=40)
LDL Cholesterol (Calc): 68 mg/dL
Non-HDL Cholesterol (Calc): 94 mg/dL (ref <130)
Total CHOL/HDL Ratio: 4.9 (calc) (ref <5.0)
Triglycerides: 185 mg/dL — ABNORMAL HIGH (ref <150)

## 2024-03-15 LAB — COMPREHENSIVE METABOLIC PANEL WITH GFR
AG Ratio: 1.4 (calc) (ref 1.0–2.5)
ALT: 11 U/L (ref 9–46)
AST: 13 U/L (ref 10–35)
Albumin: 3.7 g/dL (ref 3.6–5.1)
Alkaline phosphatase (APISO): 92 U/L (ref 35–144)
BUN/Creatinine Ratio: 13 (calc) (ref 6–22)
BUN: 21 mg/dL (ref 7–25)
CO2: 29 mmol/L (ref 20–32)
Calcium: 9.1 mg/dL (ref 8.6–10.3)
Chloride: 104 mmol/L (ref 98–110)
Creat: 1.56 mg/dL — ABNORMAL HIGH (ref 0.70–1.35)
Globulin: 2.6 g/dL (ref 1.9–3.7)
Glucose, Bld: 141 mg/dL — ABNORMAL HIGH (ref 65–99)
Potassium: 4.7 mmol/L (ref 3.5–5.3)
Sodium: 141 mmol/L (ref 135–146)
Total Bilirubin: 0.3 mg/dL (ref 0.2–1.2)
Total Protein: 6.3 g/dL (ref 6.1–8.1)
eGFR: 49 mL/min/1.73m2 — ABNORMAL LOW (ref >=60)

## 2024-03-15 LAB — HEMOGLOBIN A1C
Hgb A1c MFr Bld: 7 % — ABNORMAL HIGH (ref <5.7)
Mean Plasma Glucose: 154 mg/dL
eAG (mmol/L): 8.5 mmol/L

## 2024-03-17 ENCOUNTER — Ambulatory Visit: Payer: Self-pay | Admitting: Family Medicine

## 2024-03-17 ENCOUNTER — Telehealth: Payer: Self-pay

## 2024-03-17 NOTE — Telephone Encounter (Signed)
 Copied from CRM #[id number]. Topic: Clinical - Lab/Test Results >> Mar 17, 2024  2:33 PM Wess RAMAN wrote: Reason for CRM: Patient's niece, Sari, called to speak about patient's lab results. She stated she missed a call from [place] in the office. Wendy's name was/is not listed under designated party released. No information given.  Callback #: [phone]

## 2024-03-19 NOTE — Telephone Encounter (Signed)
 Second attempt to make contact . Lvm for call back to gain lab results.

## 2024-03-21 ENCOUNTER — Telehealth: Payer: Self-pay

## 2024-03-21 NOTE — Telephone Encounter (Signed)
 CURRENT VISIT RECORDS REQUESTED FROM [facility]. FAXED TO [phone]. MJP,LPN

## 2024-04-01 ENCOUNTER — Other Ambulatory Visit: Payer: Self-pay | Admitting: Family Medicine

## 2024-04-01 DIAGNOSIS — I10 Essential (primary) hypertension: Secondary | ICD-10-CM

## 2024-04-09 ENCOUNTER — Telehealth: Payer: Self-pay

## 2024-04-09 NOTE — Telephone Encounter (Signed)
 Copied from CRM [id number]. Topic: General - Other >> Apr 09, 2024  3:54 PM Larissa S wrote: Reason for CRM: Regis Ada with adult protective services requesting a callback from PCP/nurse to discuss any concerns about the patient.  Callback # [phone]

## 2024-04-10 ENCOUNTER — Other Ambulatory Visit: Payer: Self-pay | Admitting: Family Medicine

## 2024-04-10 ENCOUNTER — Ambulatory Visit: Payer: Self-pay | Admitting: *Deleted

## 2024-04-10 DIAGNOSIS — I10 Essential (primary) hypertension: Secondary | ICD-10-CM

## 2024-04-10 NOTE — Telephone Encounter (Signed)
 FYI Only or Action Required?: FYI only for provider.  Patient was last seen in primary care on 03/14/2024 by Duanne Butler DASEN, MD.  Called Nurse Triage reporting Pain.  Symptoms began yesterday.  Interventions attempted: OTC medications: ibuprofen.  Symptoms are: gradually worsening.  Triage Disposition: See PCP When Office is Open (Within 3 Days)  Patient/caregiver understands and will follow disposition?: Yes           Copied from CRM #[id number]. Topic: Clinical - Red Word Triage >> Apr 10, 2024  3:21 PM Eduardo Young wrote: Red Word that prompted transfer to Nurse Triage: patient had a fall in the drive way, was in pain. He was prescribed medicine a few years ago but forgot the name. Reason for Disposition  [1] MODERATE pain (e.g., interferes with normal activities, limping) AND [2] present > 3 days  Answer Assessment - Initial Assessment Questions 1. LOCATION and RADIATION: Where is the pain located? Does the pain spread (shoot) anywhere else?     Right hip  and scab noted 2. QUALITY: What does the pain feel like?  (e.g., sharp, dull, aching, burning)     Burning  3. SEVERITY: How bad is the pain? What does it keep you from doing?   (Scale 1-10; or mild, moderate, severe)     Right hip area rubbing against w/c.  Severe yesterday not as bad today  2-3 /10 4. ONSET: When did the pain start? Does it come and go, or is it there all the time?     Yesterday  5. WORK OR EXERCISE: Has there been any recent work or exercise that involved this part of the body?       6. CAUSE: What do you think is causing the hip pain?      Old injury from fall 5-6 years 7. AGGRAVATING FACTORS: What makes the hip pain worse? (e.g., walking, climbing stairs, running)     Movement in the w/c  8. OTHER SYMPTOMS: Do you have any other symptoms? (e.g., back pain, pain shooting down leg,  fever, rash)     Right hip pain down leg to foot  Protocols used: Hip Pain-A-AH  Reason for  Disposition  [1] MODERATE pain (e.g., interferes with normal activities, limping) AND [2] present > 3 days  Answer Assessment - Initial Assessment Questions 1. LOCATION and RADIATION: Where is the pain located? Does the pain spread (shoot) anywhere else?     Right hip  and scab noted 2. QUALITY: What does the pain feel like?  (e.g., sharp, dull, aching, burning)     Burning  3. SEVERITY: How bad is the pain? What does it keep you from doing?   (Scale 1-10; or mild, moderate, severe)     Right hip area rubbing against w/c.  Severe yesterday not as bad today  2-3 /10 4. ONSET: When did the pain start? Does it come and go, or is it there all the time?     Yesterday  5. WORK OR EXERCISE: Has there been any recent work or exercise that involved this part of the body?      Rubs against w/c  6. CAUSE: What do you think is causing the hip pain?      Old injury from fall 5-6 years 7. AGGRAVATING FACTORS: What makes the hip pain worse? (e.g., walking, climbing stairs, running)     Movement in the w/c  8. OTHER SYMPTOMS: Do you have any other symptoms? (e.g., back pain, pain shooting down leg,  fever, rash)     Right hip pain down leg to foot  Protocols used: Hip Pain-A-AH  Reason for Disposition  [1] MODERATE pain (e.g., interferes with normal activities, limping) AND [2] present > 3 days  Answer Assessment - Initial Assessment Questions Offered appt for tomorrow. Patient requesting to contact niece for appt due to transportation needs.      1. LOCATION and RADIATION: Where is the pain located? Does the pain spread (shoot) anywhere else?     Right hip  and scab noted 2. QUALITY: What does the pain feel like?  (e.g., sharp, dull, aching, burning)     Burning  3. SEVERITY: How bad is the pain? What does it keep you from doing?   (Scale 1-10; or mild, moderate, severe)     Right hip area rubbing against w/c.  Severe yesterday not as bad today  2-3 /10 4.  ONSET: When did the pain start? Does it come and go, or is it there all the time?     Yesterday  5. WORK OR EXERCISE: Has there been any recent work or exercise that involved this part of the body?      Rubs against w/c  6. CAUSE: What do you think is causing the hip pain?      Old injury from fall 5-6 years 7. AGGRAVATING FACTORS: What makes the hip pain worse? (e.g., walking, climbing stairs, running)     Movement in the w/c  8. OTHER SYMPTOMS: Do you have any other symptoms? (e.g., back pain, pain shooting down leg,  fever, rash)     Right hip pain down leg to foot  Protocols used: Hip Pain-A-AH  Reason for Disposition  [1] MODERATE pain (e.g., interferes with normal activities, limping) AND [2] present > 3 days  Answer Assessment - Initial Assessment Questions Offered appt for tomorrow. Patient requesting to contact niece for appt due to transportation needs. NT called Sari  and scheduled appt for 04/11/24. Patient niece reports if she is unable to bring patient to appt she will call to cancel, needs to arrange times due to work schedule.      1. LOCATION and RADIATION: Where is the pain located? Does the pain spread (shoot) anywhere else?     Right hip  and scab noted 2. QUALITY: What does the pain feel like?  (e.g., sharp, dull, aching, burning)     Burning  3. SEVERITY: How bad is the pain? What does it keep you from doing?   (Scale 1-10; or mild, moderate, severe)     Right hip area rubbing against w/c.  Severe yesterday not as bad today  2-3 /10 4. ONSET: When did the pain start? Does it come and go, or is it there all the time?     Yesterday  5. WORK OR EXERCISE: Has there been any recent work or exercise that involved this part of the body?      Rubs against w/c  6. CAUSE: What do you think is causing the hip pain?      Old injury from fall 5-6 years 7. AGGRAVATING FACTORS: What makes the hip pain worse? (e.g., walking, climbing stairs,  running)     Movement in the w/c  8. OTHER SYMPTOMS: Do you have any other symptoms? (e.g., back pain, pain shooting down leg,  fever, rash)     Right hip pain down leg to foot  Protocols used: Hip Pain-A-AH

## 2024-04-11 ENCOUNTER — Ambulatory Visit (INDEPENDENT_AMBULATORY_CARE_PROVIDER_SITE_OTHER): Admitting: Family Medicine

## 2024-04-11 ENCOUNTER — Telehealth: Payer: Self-pay

## 2024-04-11 ENCOUNTER — Other Ambulatory Visit: Payer: Self-pay | Admitting: Family Medicine

## 2024-04-11 ENCOUNTER — Other Ambulatory Visit: Payer: Self-pay

## 2024-04-11 VITALS — BP 128/68 | Ht 76.0 in | Wt 280.0 lb

## 2024-04-11 DIAGNOSIS — L02419 Cutaneous abscess of limb, unspecified: Secondary | ICD-10-CM | POA: Diagnosis not present

## 2024-04-11 DIAGNOSIS — L03119 Cellulitis of unspecified part of limb: Secondary | ICD-10-CM | POA: Diagnosis not present

## 2024-04-11 MED ORDER — MUPIROCIN 2 % EX OINT: 1.0000 | TOPICAL_OINTMENT | Freq: Two times a day (BID) | CUTANEOUS | 0 refills | Status: AC

## 2024-04-11 MED ORDER — MUPIROCIN CALCIUM 2 % EX CREA: 1.0000 | TOPICAL_CREAM | Freq: Two times a day (BID) | CUTANEOUS | 0 refills | Status: AC

## 2024-04-11 MED ORDER — SULFAMETHOXAZOLE-TRIMETHOPRIM 800-160 MG PO TABS: 1.0000 | ORAL_TABLET | Freq: Two times a day (BID) | ORAL | 0 refills | Status: AC

## 2024-04-11 NOTE — Progress Notes (Addendum)
 Subjective:    Patient ID: Eduardo Young, male    DOB: 1956/02/24, 68 y.o.   MRN: [id number] Patient presents today complaining of pain in his right lateral hip.  He states that there has been a lesion in the area for about a year possibly more.  He is uncertain.  He describes it as an ulcer.  In the crease of his leg along the inguinal ligament just lateral to the hip joint, there is a large patch of erythema.  There is roughly 6 cm in diameter.  In the center of the patch of erythema is a scabbed ulcer that is the diameter of a quarter.  He states that the ulcer has been there for approximately a year.  He is uncertain for how long the erythema has been there however the skin is hot, red, and extremely sore to the touch.  Barely touching the skin elicits significant pain.  The erythema is not streaking.  It is confined only to that area but it appears to be a secondary cellulitis surrounding the ulcer.  I believe the ulcer is likely due to friction from his adult diapers rubbing in that area constantly.  The scab covers the skin.  There is no opening.  There is no exposed subcutaneous tissue 6 cm patch of erythema.  Skin is hot and tender.  Patient reports burning pain radiating down anterior and lateral thigh in path of lateral femoral cutaneous nerve. I question if infection and pressure from undergarments in this area maybe causing meralgia paresthetica.  Past Medical History:  Diagnosis Date   Chronic venous insufficiency    Diabetes mellitus    GERD (gastroesophageal reflux disease)    Gout    Hemiparesis (HCC)    Hyperlipidemia    Hypertension    Obesity    Renal calculi    Sleep apnea, obstructive    Stroke [facility])    [id number]   Past Surgical History:  Procedure Laterality Date   BIOPSY  09/06/2018   Procedure: BIOPSY;  Surgeon: Albertus Gordy HERO, MD;  Location: [facility];  Service: Gastroenterology;;   COLONOSCOPY WITH PROPOFOL  N/A 09/06/2018   Procedure: COLONOSCOPY WITH PROPOFOL ;   Surgeon: Albertus Gordy HERO, MD;  Location: [facility];  Service: Gastroenterology;  Laterality: N/A;   LITHOTRIPSY     NASAL POLYP EXCISION     Current Outpatient Medications on File Prior to Visit  Medication Sig Dispense Refill   ACCU-CHEK FASTCLIX LANCETS MISC Check BS BID DX - E11.9 200 each 3   allopurinol  (ZYLOPRIM ) 100 MG tablet Take 1 tablet by mouth twice daily 180 tablet 0   amLODipine  (NORVASC ) 10 MG tablet Take 1 tablet by mouth once daily 90 tablet 0   aspirin  81 MG tablet Take 81 mg by mouth every evening.      atorvastatin  (LIPITOR) 40 MG tablet Take 1 tablet (40 mg total) by mouth daily. 90 tablet 2   baclofen  (LIORESAL ) 10 MG tablet Take 0.5-1 tablets (5-10 mg total) by mouth 3 (three) times daily as needed for muscle spasms. 30 each 3   bisacodyl  (BISACODYL ) 5 MG EC tablet Take 1 tablet (5 mg total) by mouth daily as needed for moderate constipation. 30 tablet 0   Blood Glucose Monitoring Suppl (ACCU-CHEK AVIVA PLUS) w/Device KIT Check BS BID DX - E11.9 1 kit 1   cloNIDine  (CATAPRES ) 0.2 MG tablet Take 1 tablet by mouth twice daily 180 tablet 1   Continuous Glucose Receiver (FREESTYLE LIBRE 2 READER)  DEVI 1 Device by Does not apply route daily at 2 am. 1 each 1   Continuous Glucose Sensor (FREESTYLE LIBRE 2 SENSOR) MISC 1 Device by Does not apply route every 14 (fourteen) days. 2 each 11   doxazosin  (CARDURA ) 4 MG tablet Take 1 tablet (4 mg total) by mouth daily. 90 tablet 0   glucose blood (ACCU-CHEK AVIVA) test strip Check BS BID DX - E11.9 150 each 3   hydrALAZINE  (APRESOLINE ) 25 MG tablet TAKE 2 TABLETS BY MOUTH THREE TIMES DAILY 180 tablet 0   insulin  glargine (LANTUS ) 100 UNIT/ML Solostar Pen Inject 58 Units into the skin daily. 45 mL 3   labetalol  (NORMODYNE ) 300 MG tablet Take 1 tablet (300 mg total) by mouth 2 (two) times daily. 180 tablet 3   Lancets Misc. (ACCU-CHEK FASTCLIX LANCET) KIT Check BS BID DX - E11.9 1 kit 1   lisinopril  (ZESTRIL ) 20 MG tablet Take 1 tablet  (20 mg total) by mouth 2 (two) times daily. 180 tablet 3   Multiple Vitamin (MULTIVITAMIN WITH MINERALS) TABS tablet Take 1 tablet by mouth daily.     polyethylene glycol powder (GLYCOLAX /MIRALAX ) powder Take 17 g by mouth daily. 510 g 11   potassium chloride  SA (KLOR-CON  M) 20 MEQ tablet Take 1 tablet (20 mEq total) by mouth daily. 90 tablet 0   silver  sulfADIAZINE  (SILVADENE ) 1 % cream Apply 1 Application topically daily. 50 g 0   traMADol  (ULTRAM ) 50 MG tablet TAKE 1 TABLET BY MOUTH EVERY 6 HOURS AS NEEDED 30 tablet 0   TRULICITY  0.75 MG/0.5ML SOAJ INJECT 0.75MG  (0.5ML) UNDER THE SKIN ONCE A WEEK. 2 mL 0   No current facility-administered medications on file prior to visit.   No Known Allergies Social History   Socioeconomic History   Marital status: Single    Spouse name: Not on file   Number of children: Not on file   Years of education: Not on file   Highest education level: Not on file  Occupational History   Not on file  Tobacco Use   Smoking status: Former   Smokeless tobacco: Never  Vaping Use   Vaping status: Never Used  Substance and Sexual Activity   Alcohol use: Yes    Comment: Occasional   Drug use: Not Currently    Types: Cocaine, Marijuana   Sexual activity: Not on file  Other Topics Concern   Not on file  Social History Narrative   Not on file   Social Drivers of Health   Financial Resource Strain: Medium Risk (03/24/2020)   Overall Financial Resource Strain (CARDIA)    Difficulty of Paying Living Expenses: Somewhat hard  Food Insecurity: Not on file  Transportation Needs: Not on file  Physical Activity: Not on file  Stress: Not on file  Social Connections: Not on file  Intimate Partner Violence: Not on file      Review of Systems  All other systems reviewed and are negative.      Objective:   Physical Exam Vitals reviewed.  Constitutional:      General: He is not in acute distress.    Appearance: He is well-developed. He is obese. He is  not ill-appearing, toxic-appearing or diaphoretic.  HENT:     Head: Normocephalic and atraumatic.  Neck:     Vascular: No carotid bruit.  Cardiovascular:     Rate and Rhythm: Normal rate and regular rhythm.     Heart sounds: Normal heart sounds. No murmur heard.  No friction rub. No gallop.  Pulmonary:     Effort: Pulmonary effort is normal. No respiratory distress.     Breath sounds: Normal breath sounds. No wheezing or rales.  Chest:     Chest wall: No tenderness.  Musculoskeletal:     Cervical back: Neck supple. No tenderness.     Right lower leg: Edema present.     Left lower leg: Edema present.       Legs:  Lymphadenopathy:     Cervical: No cervical adenopathy.  Skin:    Coloration: Skin is not jaundiced.     Findings: Erythema and lesion present. No bruising.  Neurological:     Mental Status: He is alert and oriented to person, place, and time. Mental status is at baseline.     Motor: Weakness present.     Coordination: Coordination abnormal.     Gait: Gait abnormal.  Psychiatric:        Mood and Affect: Mood normal.        Behavior: Behavior normal.        Thought Content: Thought content normal.        Judgment: Judgment normal.       Cellulitis and abscess of leg Patient appears to have a secondary cellulitis.  We will treat this with Bactrim  double strength tablets twice daily for 10 days.  He can also apply mupirocin  cream twice daily to the affected area for comfort and to help with the infection.  My other concern is why this lesion will not heal.  Could there be an underlying skin cancer.  I am uncertain.  Recommend recheck in 2 weeks after having completed antibiotics for a possible biopsy of the skin in that area.  The other possibility is a pressure/friction ulcer due to his undergarments.  I would like to see how it looks after the infection is treated

## 2024-04-11 NOTE — Telephone Encounter (Signed)
 Requested Prescriptions  Pending Prescriptions Disp Refills   amLODipine  (NORVASC ) 10 MG tablet [Pharmacy Med Name: amLODIPine  Besylate 10 MG Oral Tablet] 90 tablet 0    Sig: Take 1 tablet by mouth once daily     Cardiovascular: Calcium  Channel Blockers 2 Failed - 04/11/2024  1:53 PM      Failed - Last BP in normal range    BP Readings from Last 1 Encounters:  03/14/24 (!) 146/78         Passed - Last Heart Rate in normal range    Pulse Readings from Last 1 Encounters:  03/14/24 61         Passed - Valid encounter within last 6 months    Recent Outpatient Visits           4 weeks ago Chronic kidney disease, stage 3a (HCC)   [place] [facility] Duanne Butler DASEN, MD   9 months ago Type 2 diabetes mellitus with stage 3a chronic kidney disease, with long-term current use of insulin  [facility])   [place] [facility] Duanne Butler DASEN, MD   1 year ago Type 2 diabetes mellitus with stage 3a chronic kidney disease, with long-term current use of insulin  [facility])   [place] [facility] [place], Butler DASEN, MD   1 year ago General medical exam   [place] [facility] Duanne Butler DASEN, MD   12 years ago Cellulitis, leg   [facility], [place] SQUIBB, MD

## 2024-04-11 NOTE — Telephone Encounter (Signed)
 Message received from pharmacy: Script sent for cream of mupirocin  cream (BACTROBAN ) 2 %. Pharmacy sent reply; insurance prefers ointment. They need a new prescription.

## 2024-04-13 ENCOUNTER — Other Ambulatory Visit: Payer: Self-pay | Admitting: Family Medicine

## 2024-04-13 DIAGNOSIS — I10 Essential (primary) hypertension: Secondary | ICD-10-CM

## 2024-04-14 NOTE — Telephone Encounter (Signed)
 Rx 04/11/24 #90- duplicate request Requested Prescriptions  Pending Prescriptions Disp Refills   amLODipine  (NORVASC ) 10 MG tablet [Pharmacy Med Name: amLODIPine  Besylate 10 MG Oral Tablet] 90 tablet 0    Sig: Take 1 tablet by mouth once daily     Cardiovascular: Calcium  Channel Blockers 2 Passed - 04/14/2024  3:28 PM      Passed - Last BP in normal range    BP Readings from Last 1 Encounters:  04/11/24 128/68         Passed - Last Heart Rate in normal range    Pulse Readings from Last 1 Encounters:  03/14/24 61         Passed - Valid encounter within last 6 months    Recent Outpatient Visits           3 days ago Cellulitis and abscess of leg   [place] [facility] Duanne Butler DASEN, MD   1 month ago Chronic kidney disease, stage 3a [facility])   [facility] Duanne Butler DASEN, MD   9 months ago Type 2 diabetes mellitus with stage 3a chronic kidney disease, with long-term current use of insulin  [facility])   [place] [facility] Duanne Butler DASEN, MD   1 year ago Type 2 diabetes mellitus with stage 3a chronic kidney disease, with long-term current use of insulin  [facility])   [place] [facility] Pickard, Butler DASEN, MD   1 year ago General medical exam   [facility] Pickard, Butler DASEN, MD

## 2024-04-21 ENCOUNTER — Other Ambulatory Visit: Payer: Self-pay | Admitting: Family Medicine

## 2024-04-25 ENCOUNTER — Ambulatory Visit: Admitting: Family Medicine

## 2024-04-25 ENCOUNTER — Encounter: Payer: Self-pay | Admitting: Family Medicine

## 2024-04-25 VITALS — BP 110/64 | HR 69 | Ht 76.0 in | Wt 280.0 lb

## 2024-04-25 DIAGNOSIS — Z23 Encounter for immunization: Secondary | ICD-10-CM | POA: Diagnosis not present

## 2024-04-25 DIAGNOSIS — G8111 Spastic hemiplegia affecting right dominant side: Secondary | ICD-10-CM

## 2024-04-25 DIAGNOSIS — L02419 Cutaneous abscess of limb, unspecified: Secondary | ICD-10-CM | POA: Diagnosis not present

## 2024-04-25 DIAGNOSIS — Z794 Long term (current) use of insulin: Secondary | ICD-10-CM | POA: Diagnosis not present

## 2024-04-25 DIAGNOSIS — L03119 Cellulitis of unspecified part of limb: Secondary | ICD-10-CM | POA: Diagnosis not present

## 2024-04-25 DIAGNOSIS — E7800 Pure hypercholesterolemia, unspecified: Secondary | ICD-10-CM | POA: Diagnosis not present

## 2024-04-25 DIAGNOSIS — N1831 Chronic kidney disease, stage 3a: Secondary | ICD-10-CM | POA: Diagnosis not present

## 2024-04-25 DIAGNOSIS — E1122 Type 2 diabetes mellitus with diabetic chronic kidney disease: Secondary | ICD-10-CM

## 2024-04-25 DIAGNOSIS — I10 Essential (primary) hypertension: Secondary | ICD-10-CM

## 2024-04-25 MED ORDER — LABETALOL HCL 300 MG PO TABS: 300.0000 mg | ORAL_TABLET | Freq: Two times a day (BID) | ORAL | 3 refills | Status: AC

## 2024-04-25 MED ORDER — POTASSIUM CHLORIDE CRYS ER 20 MEQ PO TBCR: 20.0000 meq | EXTENDED_RELEASE_TABLET | Freq: Every day | ORAL | 3 refills | Status: AC

## 2024-04-25 MED ORDER — CLONIDINE HCL 0.2 MG PO TABS: 0.2000 mg | ORAL_TABLET | Freq: Two times a day (BID) | ORAL | 3 refills | Status: AC

## 2024-04-25 MED ORDER — LISINOPRIL 20 MG PO TABS: 20.0000 mg | ORAL_TABLET | Freq: Two times a day (BID) | ORAL | 3 refills | Status: AC

## 2024-04-25 MED ORDER — ALLOPURINOL 100 MG PO TABS: 100.0000 mg | ORAL_TABLET | Freq: Two times a day (BID) | ORAL | 3 refills | Status: AC

## 2024-04-25 MED ORDER — DOXAZOSIN MESYLATE 4 MG PO TABS: 4.0000 mg | ORAL_TABLET | Freq: Every day | ORAL | 3 refills | Status: AC

## 2024-04-25 MED ORDER — HYDRALAZINE HCL 25 MG PO TABS: 50.0000 mg | ORAL_TABLET | Freq: Three times a day (TID) | ORAL | 3 refills | Status: DC

## 2024-04-25 MED ORDER — AMLODIPINE BESYLATE 10 MG PO TABS: 10.0000 mg | ORAL_TABLET | Freq: Every day | ORAL | 3 refills | Status: AC

## 2024-04-25 MED ORDER — ATORVASTATIN CALCIUM 40 MG PO TABS: 40.0000 mg | ORAL_TABLET | Freq: Every day | ORAL | 3 refills | Status: AC

## 2024-04-25 MED ORDER — SILVER SULFADIAZINE 1 % EX CREA: 1.0000 | TOPICAL_CREAM | Freq: Every day | CUTANEOUS | 3 refills | Status: AC

## 2024-04-25 NOTE — Addendum Note (Signed)
 Addended by: ANGELENA RONAL BRADLEY K on: 04/25/2024 02:36 PM   Modules accepted: Orders

## 2024-04-25 NOTE — Progress Notes (Signed)
 Subjective:    Patient ID: Eduardo Young, male    DOB: 02-16-1956, 68 y.o.   MRN: [id number] 04/11/24 Patient presents today complaining of pain in his right lateral hip.  He states that there has been a lesion in the area for about a year possibly more.  He is uncertain.  He describes it as an ulcer.  In the crease of his leg along the inguinal ligament just lateral to the hip joint, there is a large patch of erythema.  There is roughly 6 cm in diameter.  In the center of the patch of erythema is a scabbed ulcer that is the diameter of a quarter.  He states that the ulcer has been there for approximately a year.  He is uncertain for how long the erythema has been there however the skin is hot, red, and extremely sore to the touch.  Barely touching the skin elicits significant pain.  The erythema is not streaking.  It is confined only to that area but it appears to be a secondary cellulitis surrounding the ulcer.  I believe the ulcer is likely due to friction from his adult diapers rubbing in that area constantly.  The scab covers the skin.  There is no opening.  There is no exposed subcutaneous tissue 6 cm patch of erythema.  Skin is hot and tender.  Patient reports burning pain radiating down anterior and lateral thigh in path of lateral femoral cutaneous nerve. I question if infection and pressure from undergarments in this area maybe causing meralgia paresthetica.  At that time, my plan was:  Patient appears to have a secondary cellulitis.  We will treat this with Bactrim  double strength tablets twice daily for 10 days.  He can also apply mupirocin  cream twice daily to the affected area for comfort and to help with the infection.  My other concern is why this lesion will not heal.  Could there be an underlying skin cancer.  I am uncertain.  Recommend recheck in 2 weeks after having completed antibiotics for a possible biopsy of the skin in that area.  The other possibility is a pressure/friction ulcer  due to his undergarments.  I would like to see how it looks after the infection is treated  04/25/24 Wound is looking much better.  There is a central area 6 mm in diameter where there is skin breakdown.  There is a scab on top of that that I was able to remove bluntly using my fingertips.  There is surrounding pink erythema that is irritation that is roughly 4 cm in diameter.  However it is much less erythematous.  It is no longer hot to the touch.  Is no longer painful to the touch.  The infection is healing.  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 rubbing in that area causing an abrasion Past Medical History:  Diagnosis Date   Chronic venous insufficiency    Diabetes mellitus    GERD (gastroesophageal reflux disease)    Gout    Hemiparesis (HCC)    Hyperlipidemia    Hypertension    Obesity    Renal calculi    Sleep apnea, obstructive    [facility])    [id number]   Past Surgical History:  Procedure Laterality Date   BIOPSY  09/06/2018   Procedure: BIOPSY;  Surgeon: Albertus Gordy HERO, MD;  Location: [facility];  Service: Gastroenterology;;   COLONOSCOPY WITH PROPOFOL  N/A 09/06/2018  Procedure: COLONOSCOPY WITH PROPOFOL ;  Surgeon: Albertus Gordy HERO, MD;  Location: [facility];  Service: Gastroenterology;  Laterality: N/A;   LITHOTRIPSY     NASAL POLYP EXCISION     Current Outpatient Medications on File Prior to Visit  Medication Sig Dispense Refill   ACCU-CHEK FASTCLIX LANCETS MISC Check BS BID DX - E11.9 200 each 3   allopurinol  (ZYLOPRIM ) 100 MG tablet Take 1 tablet by mouth twice daily 180 tablet 1   amLODipine  (NORVASC ) 10 MG tablet Take 1 tablet by mouth once daily 90 tablet 0   aspirin  81 MG tablet Take 81 mg by mouth every evening.      atorvastatin  (LIPITOR) 40 MG tablet Take 1 tablet (40 mg total) by mouth daily. 90 tablet 2   baclofen  (LIORESAL ) 10 MG tablet Take 0.5-1 tablets (5-10 mg total) by mouth 3 (three) times daily as needed for  muscle spasms. 30 each 3   bisacodyl  (BISACODYL ) 5 MG EC tablet Take 1 tablet (5 mg total) by mouth daily as needed for moderate constipation. 30 tablet 0   Blood Glucose Monitoring Suppl (ACCU-CHEK AVIVA PLUS) w/Device KIT Check BS BID DX - E11.9 1 kit 1   cloNIDine  (CATAPRES ) 0.2 MG tablet Take 1 tablet by mouth twice daily 180 tablet 1   Continuous Glucose Receiver (FREESTYLE LIBRE 2 READER) DEVI 1 Device by Does not apply route daily at 2 am. 1 each 1   Continuous Glucose Sensor (FREESTYLE LIBRE 2 SENSOR) MISC 1 Device by Does not apply route every 14 (fourteen) days. 2 each 11   doxazosin  (CARDURA ) 4 MG tablet Take 1 tablet (4 mg total) by mouth daily. 90 tablet 0   glucose blood (ACCU-CHEK AVIVA) test strip Check BS BID DX - E11.9 150 each 3   hydrALAZINE  (APRESOLINE ) 25 MG tablet TAKE 2 TABLETS BY MOUTH THREE TIMES DAILY 180 tablet 0   insulin  glargine (LANTUS ) 100 UNIT/ML Solostar Pen Inject 58 Units into the skin daily. 45 mL 3   labetalol  (NORMODYNE ) 300 MG tablet Take 1 tablet (300 mg total) by mouth 2 (two) times daily. 180 tablet 3   Lancets Misc. (ACCU-CHEK FASTCLIX LANCET) KIT Check BS BID DX - E11.9 1 kit 1   lisinopril  (ZESTRIL ) 20 MG tablet Take 1 tablet (20 mg total) by mouth 2 (two) times daily. 180 tablet 3   Multiple Vitamin (MULTIVITAMIN WITH MINERALS) TABS tablet Take 1 tablet by mouth daily.     mupirocin  cream (BACTROBAN ) 2 % Apply 1 Application topically 2 (two) times daily. 15 g 0   mupirocin  ointment (BACTROBAN ) 2 % Apply 1 Application topically 2 (two) times daily. 30 g 0   polyethylene glycol powder (GLYCOLAX /MIRALAX ) powder Take 17 g by mouth daily. 510 g 11   potassium chloride  SA (KLOR-CON  M) 20 MEQ tablet Take 1 tablet (20 mEq total) by mouth daily. 90 tablet 0   silver  sulfADIAZINE  (SILVADENE ) 1 % cream Apply 1 Application topically daily. 50 g 0   sulfamethoxazole -trimethoprim  (BACTRIM  DS) 800-160 MG tablet Take 1 tablet by mouth 2 (two) times daily. 20 tablet  0   traMADol  (ULTRAM ) 50 MG tablet TAKE 1 TABLET BY MOUTH EVERY 6 HOURS AS NEEDED 30 tablet 0   TRULICITY  0.75 MG/0.5ML SOAJ INJECT 0.75MG  (0.5ML) UNDER THE SKIN ONCE A WEEK. 2 mL 0   No current facility-administered medications on file prior to visit.   No Known Allergies Social History   Socioeconomic History   Marital status: Single    Spouse  name: Not on file   Number of children: Not on file   Years of education: Not on file   Highest education level: Not on file  Occupational History   Not on file  Tobacco Use   Smoking status: Former   Smokeless tobacco: Never  Vaping Use   Vaping status: Never Used  Substance and Sexual Activity   Alcohol use: Yes    Comment: Occasional   Drug use: Not Currently    Types: Cocaine, Marijuana   Sexual activity: Not on file  Other Topics Concern   Not on file  Social History Narrative   Not on file   Social Drivers of Health   Financial Resource Strain: Medium Risk (03/24/2020)   Overall Financial Resource Strain (CARDIA)    Difficulty of Paying Living Expenses: Somewhat hard  Food Insecurity: Not on file  Transportation Needs: Not on file  Physical Activity: Not on file  Stress: Not on file  Social Connections: Not on file  Intimate Partner Violence: Not on file      Review of Systems  All other systems reviewed and are negative.      Objective:   Physical Exam Vitals reviewed.  Constitutional:      General: He is not in acute distress.    Appearance: He is well-developed. He is obese. He is not ill-appearing, toxic-appearing or diaphoretic.  HENT:     Head: Normocephalic and atraumatic.  Neck:     Vascular: No carotid bruit.  Cardiovascular:     Rate and Rhythm: Normal rate and regular rhythm.     Heart sounds: Normal heart sounds. No murmur heard.    No friction rub. No gallop.  Pulmonary:     Effort: Pulmonary effort is normal. No respiratory distress.     Breath sounds: Normal breath sounds. No wheezing or  rales.  Chest:     Chest wall: No tenderness.  Musculoskeletal:     Cervical back: Neck supple. No tenderness.     Right lower leg: Edema present.     Left lower leg: Edema present.       Legs:  Lymphadenopathy:     Cervical: No cervical adenopathy.  Skin:    Coloration: Skin is not jaundiced.     Findings: Lesion present. No bruising or erythema.  Neurological:     Mental Status: He is alert and oriented to person, place, and time. Mental status is at baseline.     Motor: Weakness present.     Coordination: Coordination abnormal.     Gait: Gait abnormal.  Psychiatric:        Mood and Affect: Mood normal.        Behavior: Behavior normal.        Thought Content: Thought content normal.        Judgment: Judgment normal.    Cellulitis and abscess of leg  Benign essential HTN - Plan: cloNIDine  (CATAPRES ) 0.2 MG tablet, amLODipine  (NORVASC ) 10 MG tablet, hydrALAZINE  (APRESOLINE ) 25 MG tablet  Chronic kidney disease, stage 3a (HCC) - Plan: cloNIDine  (CATAPRES ) 0.2 MG tablet, potassium chloride  SA (KLOR-CON  M) 20 MEQ tablet, doxazosin  (CARDURA ) 4 MG tablet  Spastic hemiparesis of right dominant side (HCC) - Plan: cloNIDine  (CATAPRES ) 0.2 MG tablet  Essential hypertension - Plan: potassium chloride  SA (KLOR-CON  M) 20 MEQ tablet, doxazosin  (CARDURA ) 4 MG tablet, lisinopril  (ZESTRIL ) 20 MG tablet, labetalol  (NORMODYNE ) 300 MG tablet  Pure hypercholesterolemia - Plan: atorvastatin  (LIPITOR) 40 MG tablet  Type 2 diabetes mellitus  with stage 3a chronic kidney disease, with long-term current use of insulin  (HCC) - Plan: atorvastatin  (LIPITOR) 40 MG tablet Area looks much better today.  No further antibiotics are necessary.  Recommended applying Silvadene  to the area daily until healed to prevent secondary infection.  Recommended putting soft wash rag between the skin and the elastic web of his undergarment to prevent friction and irritation which I believe is what caused the lesion.  The  patient is paralyzed on that side and is unable to tell that the elastic band of the undergarment is cutting into his skin.  However the way he is sitting in his chair, the elastic band is extremely tight directly in the area where the wound is forming.  We also discussed having home health nursing come to his home and have a catheter placed weekly so that he can stop wearing the adult undergarments to allow the lesion to heal.  He declines that at the present time

## 2024-05-13 DIAGNOSIS — E1122 Type 2 diabetes mellitus with diabetic chronic kidney disease: Secondary | ICD-10-CM | POA: Diagnosis not present

## 2024-06-09 ENCOUNTER — Other Ambulatory Visit: Payer: Self-pay | Admitting: Family Medicine

## 2024-06-10 NOTE — Telephone Encounter (Signed)
 Requested Prescriptions  Pending Prescriptions Disp Refills   TRULICITY  0.75 MG/0.5ML SOAJ [Pharmacy Med Name: Trulicity  Subcutaneous Solution Auto-injector 0.75 MG/0.5ML] 6 mL 0    Sig: INJECT 0.75MG  (0.5ML) UNDER THE SKIN ONCE A WEEK.     Endocrinology:  Diabetes - GLP-1 Receptor Agonists Passed - 06/10/2024  4:23 PM      Passed - HBA1C is between 0 and 7.9 and within 180 days    Hgb A1c MFr Bld  Date Value Ref Range Status  03/14/2024 7.0 (H) <5.7 % Final    Comment:    For someone without known diabetes, a hemoglobin A1c value of 6.5% or greater indicates that they may have  diabetes and this should be confirmed with a follow-up  test. . For someone with known diabetes, a value <7% indicates  that their diabetes is well controlled and a value  greater than or equal to 7% indicates suboptimal  control. A1c targets should be individualized based on  duration of diabetes, age, comorbid conditions, and  other considerations. . Currently, no consensus exists regarding use of hemoglobin A1c for diagnosis of diabetes for children. SABRA Amy - Valid encounter within last 6 months    Recent Outpatient Visits           1 month ago Cellulitis and abscess of leg   [place] [facility] Duanne Butler DASEN, MD   2 months ago Cellulitis and abscess of leg   [place] [facility] Duanne, Butler DASEN, MD   2 months ago Chronic kidney disease, stage 3a [facility])   [place] [facility] Duanne Butler DASEN, MD   11 months ago Type 2 diabetes mellitus with stage 3a chronic kidney disease, with long-term current use of insulin  [facility])   [place] [facility] Duanne Butler DASEN, MD   1 year ago Type 2 diabetes mellitus with stage 3a chronic kidney disease, with long-term current use of insulin  [facility])   [place] [facility] Pickard, Butler DASEN, MD

## 2024-06-11 ENCOUNTER — Telehealth: Payer: Self-pay

## 2024-06-11 NOTE — Telephone Encounter (Signed)
 PAP: Patient assistance application for Trulicity through [employer] has been mailed to pt's home address on file. Provider portion of application will be faxed to provider's office.

## 2024-06-24 NOTE — Telephone Encounter (Signed)
 Received patient portion (Eduardo Young) pap application for Trulicity .

## 2024-06-25 ENCOUNTER — Ambulatory Visit

## 2024-06-25 VITALS — Ht 72.0 in | Wt 280.0 lb

## 2024-06-25 DIAGNOSIS — Z0000 Encounter for general adult medical examination without abnormal findings: Secondary | ICD-10-CM

## 2024-06-25 NOTE — Progress Notes (Signed)
 Chief Complaint  Patient presents with   Medicare Wellness     Subjective:   Eduardo Young is a 68 y.o. male who presents for a Medicare Annual Wellness Visit.  Visit info / Clinical Intake: Medicare Wellness Visit Type:: Subsequent Annual Wellness Visit Persons participating in visit and providing information:: patient Medicare Wellness Visit Mode:: Telephone If telephone:: video declined Since this visit was completed virtually, some vitals may be partially provided or unavailable. Missing vitals are due to the limitations of the virtual format.: Documented vitals are patient reported If Telephone or Video please confirm:: I connected with patient using audio/video enable telemedicine. I verified patient identity with two identifiers, discussed telehealth limitations, and patient agreed to proceed. Patient Location:: home Provider Location:: office Interpreter Needed?: No Pre-visit prep was completed: yes AWV questionnaire completed by patient prior to visit?: no Living arrangements:: (!) lives alone Patient's Overall Health Status Rating: good Typical amount of pain: some Does pain affect daily life?: no Are you currently prescribed opioids?: no  Dietary Habits and Nutritional Risks How many meals a day?: 3 Eats fruit and vegetables daily?: yes Most meals are obtained by: preparing own meals; eating out In the last 2 weeks, have you had any of the following?: none Diabetic:: (!) yes Any non-healing wounds?: (!) yes How often do you check your BS?: 2 Would you like to be referred to a Nutritionist or for Diabetic Management? : no  Functional Status Activities of Daily Living (to include ambulation/medication): Independent Ambulation: Independent with device- listed below Home Assistive Devices/Equipment: Wheelchair Medication Administration: Needs assistance (comment) Home Management (perform basic housework or laundry): Independent Manage your own finances?:  yes Primary transportation is: family / friends Concerns about vision?: no *vision screening is required for WTM* Concerns about hearing?: no  Fall Screening Falls in the past year?: 0 Number of falls in past year: 0 Was there an injury with Fall?: 0 Fall Risk Category Calculator: 0 Patient Fall Risk Level: Low Fall Risk  Fall Risk Patient at Risk for Falls Due to: Impaired mobility Fall risk Follow up: Falls prevention discussed; Education provided; Falls evaluation completed  Home and Transportation Safety: All rugs have non-skid backing?: yes All stairs or steps have railings?: yes Grab bars in the bathtub or shower?: yes Have non-skid surface in bathtub or shower?: yes Good home lighting?: yes Regular seat belt use?: yes Hospital stays in the last year:: no  Cognitive Assessment Difficulty concentrating, remembering, or making decisions? : no Will 6CIT or Mini Cog be Completed: no 6CIT or Mini Cog Declined: patient alert, oriented, able to answer questions appropriately and recall recent events  Advance Directives (For Healthcare) Does Patient Have a Medical Advance Directive?: Yes Does patient want to make changes to medical advance directive?: No - Patient declined Type of Advance Directive: Healthcare Power of Attorney Copy of Healthcare Power of Attorney in Chart?: Yes - validated most recent copy scanned in chart (See row information)  Reviewed/Updated  Reviewed/Updated: Reviewed All (Medical, Surgical, Family, Medications, Allergies, Care Teams, Patient Goals)    Allergies (verified) Patient has no known allergies.   Current Medications (verified) Outpatient Encounter Medications as of 06/25/2024  Medication Sig   ACCU-CHEK FASTCLIX LANCETS MISC Check BS BID DX - E11.9   allopurinol  (ZYLOPRIM ) 100 MG tablet Take 1 tablet (100 mg total) by mouth 2 (two) times daily.   amLODipine  (NORVASC ) 10 MG tablet Take 1 tablet (10 mg total) by mouth daily.   aspirin  81  MG tablet  Take 81 mg by mouth every evening.    atorvastatin  (LIPITOR) 40 MG tablet Take 1 tablet (40 mg total) by mouth daily.   baclofen  (LIORESAL ) 10 MG tablet Take 0.5-1 tablets (5-10 mg total) by mouth 3 (three) times daily as needed for muscle spasms.   bisacodyl  (BISACODYL ) 5 MG EC tablet Take 1 tablet (5 mg total) by mouth daily as needed for moderate constipation.   Blood Glucose Monitoring Suppl (ACCU-CHEK AVIVA PLUS) w/Device KIT Check BS BID DX - E11.9   cloNIDine  (CATAPRES ) 0.2 MG tablet Take 1 tablet (0.2 mg total) by mouth 2 (two) times daily.   doxazosin  (CARDURA ) 4 MG tablet Take 1 tablet (4 mg total) by mouth daily.   glucose blood (ACCU-CHEK AVIVA) test strip Check BS BID DX - E11.9   hydrALAZINE  (APRESOLINE ) 25 MG tablet Take 2 tablets (50 mg total) by mouth 3 (three) times daily.   insulin  glargine (LANTUS ) 100 UNIT/ML Solostar Pen Inject 58 Units into the skin daily.   labetalol  (NORMODYNE ) 300 MG tablet Take 1 tablet (300 mg total) by mouth 2 (two) times daily.   Lancets Misc. (ACCU-CHEK FASTCLIX LANCET) KIT Check BS BID DX - E11.9   lisinopril  (ZESTRIL ) 20 MG tablet Take 1 tablet (20 mg total) by mouth 2 (two) times daily.   Multiple Vitamin (MULTIVITAMIN WITH MINERALS) TABS tablet Take 1 tablet by mouth daily.   mupirocin  cream (BACTROBAN ) 2 % Apply 1 Application topically 2 (two) times daily.   mupirocin  ointment (BACTROBAN ) 2 % Apply 1 Application topically 2 (two) times daily.   polyethylene glycol powder (GLYCOLAX /MIRALAX ) powder Take 17 g by mouth daily.   potassium chloride  SA (KLOR-CON  M) 20 MEQ tablet Take 1 tablet (20 mEq total) by mouth daily.   silver  sulfADIAZINE  (SILVADENE ) 1 % cream Apply 1 Application topically daily.   traMADol  (ULTRAM ) 50 MG tablet TAKE 1 TABLET BY MOUTH EVERY 6 HOURS AS NEEDED   TRULICITY  0.75 MG/0.5ML SOAJ INJECT 0.75MG  (0.5ML) UNDER THE SKIN ONCE A WEEK.   Continuous Glucose Receiver (FREESTYLE LIBRE 2 READER) DEVI 1 Device by Does  not apply route daily at 2 am. (Patient not taking: Reported on 06/25/2024)   Continuous Glucose Sensor (FREESTYLE LIBRE 2 SENSOR) MISC 1 Device by Does not apply route every 14 (fourteen) days. (Patient not taking: Reported on 06/25/2024)   sulfamethoxazole -trimethoprim  (BACTRIM  DS) 800-160 MG tablet Take 1 tablet by mouth 2 (two) times daily.   No facility-administered encounter medications on file as of 06/25/2024.    History: Past Medical History:  Diagnosis Date   Chronic venous insufficiency    Diabetes mellitus    GERD (gastroesophageal reflux disease)    Gout    Hemiparesis (HCC)    Hyperlipidemia    Hypertension    Obesity    Renal calculi    Sleep apnea, obstructive    Stroke [facility])    [id number]   Past Surgical History:  Procedure Laterality Date   BIOPSY  09/06/2018   Procedure: BIOPSY;  Surgeon: Albertus Gordy HERO, MD;  Location: [facility];  Service: Gastroenterology;;   COLONOSCOPY WITH PROPOFOL  N/A 09/06/2018   Procedure: COLONOSCOPY WITH PROPOFOL ;  Surgeon: Albertus Gordy HERO, MD;  Location: [facility];  Service: Gastroenterology;  Laterality: N/A;   LITHOTRIPSY     NASAL POLYP EXCISION     Family History  Problem Relation Age of Onset   Heart attack Mother    Hypertension Mother    Hyperlipidemia Mother    Hypertension Father  Hyperlipidemia Father    CVA Father    Heart attack Sister    Lung cancer Brother    Heart attack Brother    Ovarian cancer Sister    Heart attack Sister    CVA Sister    Social History   Occupational History   Not on file  Tobacco Use   Smoking status: Former   Smokeless tobacco: Never  Vaping Use   Vaping status: Never Used  Substance and Sexual Activity   Alcohol use: Yes    Comment: Occasional   Drug use: Not Currently    Types: Cocaine, Marijuana   Sexual activity: Not on file   Tobacco Counseling Counseling given: Not Answered  SDOH Screenings   Food Insecurity: No Food Insecurity (06/25/2024)  Housing: Low Risk   (06/25/2024)  Transportation Needs: No Transportation Needs (06/25/2024)  Utilities: Not At Risk (06/25/2024)  Alcohol Screen: Low Risk  (04/16/2021)  Depression (PHQ2-9): Low Risk  (06/25/2024)  Financial Resource Strain: Medium Risk (03/24/2020)  Physical Activity: Inactive (06/25/2024)  Social Connections: Socially Isolated (06/25/2024)  Stress: No Stress Concern Present (06/25/2024)  Tobacco Use: Medium Risk (06/25/2024)  Health Literacy: Inadequate Health Literacy (06/25/2024)   See flowsheets for full screening details  Depression Screen Depression Screening Exception Documentation Depression Screening Exception:: Patient refusal  PHQ 2 & 9 Depression Scale- Over the past 2 weeks, how often have you been bothered by any of the following problems? Little interest or pleasure in doing things: 0 Feeling down, depressed, or hopeless (PHQ Adolescent also includes...irritable): 0 PHQ-2 Total Score: 0 Trouble falling or staying asleep, or sleeping too much: 0 Feeling tired or having little energy: 0 Poor appetite or overeating (PHQ Adolescent also includes...weight loss): 0 Feeling bad about yourself - or that you are a failure or have let yourself or your family down: 0 Trouble concentrating on things, such as reading the newspaper or watching television (PHQ Adolescent also includes...like school work): 0 Moving or speaking so slowly that other people could have noticed. Or the opposite - being so fidgety or restless that you have been moving around a lot more than usual: 0 Thoughts that you would be better off dead, or of hurting yourself in some way: 0 PHQ-9 Total Score: 0  Depression Treatment Depression Interventions/Treatment : EYV7-0 Score <4 Follow-up Not Indicated     Goals Addressed             This Visit's Progress    Maintain health and independence   On track            Objective:    Today's Vitals   06/25/24 1444  Weight: 280 lb (127 kg)  Height: 6' (1.829 m)    Body mass index is 37.97 kg/m.  Hearing/Vision screen Hearing Screening - Comments:: Patient is able to hear conversational tones without difficulty. No issues reported.   Vision Screening - Comments:: Wears rx glasses - up to date with routine eye exams with Dr. Vernona  Immunizations and Health Maintenance Health Maintenance  Topic Date Due   Diabetic kidney evaluation - Urine ACR  06/28/2016   COVID-19 Vaccine (3 - 2025-26 season) 03/24/2024   Pneumococcal Vaccine: 50+ Years (2 of 2 - PCV) 03/14/2025 (Originally 09/18/2019)   Hepatitis C Screening  03/14/2025 (Originally 03/28/1974)   FOOT EXAM  06/28/2024   HEMOGLOBIN A1C  09/14/2024   OPHTHALMOLOGY EXAM  01/16/2025   Diabetic kidney evaluation - eGFR measurement  03/14/2025   Medicare Annual Wellness (AWV)  06/25/2025  Colonoscopy  09/06/2028   DTaP/Tdap/Td (2 - Td or Tdap) 11/13/2032   Influenza Vaccine  Completed   Zoster Vaccines- Shingrix   Completed   Meningococcal B Vaccine  Aged Out        Assessment/Plan:  This is a routine wellness examination for [facility].  Patient Care Team: Duanne Butler DASEN, MD as PCP - General (Family Medicine) Nicholaus Sherlean CROME, [facility] (Inactive) as Pharmacist (Pharmacist)  I have personally reviewed and noted the following in the patient's chart:   Medical and social history Use of alcohol, tobacco or illicit drugs  Current medications and supplements including opioid prescriptions. Functional ability and status Nutritional status Physical activity Advanced directives List of other physicians Hospitalizations, surgeries, and ER visits in previous 12 months Vitals Screenings to include cognitive, depression, and falls Referrals and appointments  No orders of the defined types were placed in this encounter.  In addition, I have reviewed and discussed with patient certain preventive protocols, quality metrics, and best practice recommendations. A written personalized care plan for  preventive services as well as general preventive health recommendations were provided to patient.   Eduardo Charmaine Browner, LPN   [id number]   Return in 1 year (on 06/25/2025).  After Visit Summary: (Mail) Due to this being a telephonic visit, the after visit summary with patients personalized plan was offered to patient via mail   Nurse Notes: No concerns at this time

## 2024-06-25 NOTE — Patient Instructions (Signed)
 [name],  Thank you for taking the time for your Medicare Wellness Visit. I appreciate your continued commitment to your health goals. Please review the care plan we discussed, and feel free to reach out if I can assist you further.  Please note that Annual Wellness Visits do not include a physical exam. Some assessments may be limited, especially if the visit was conducted virtually. If needed, we may recommend an in-person follow-up with your provider.  Ongoing Care Seeing your primary care provider every 3 to 6 months helps us  monitor your health and provide consistent, personalized care.   Referrals If a referral was made during today's visit and you haven't received any updates within two weeks, please contact the referred provider directly to check on the status.  Recommended Screenings:  Health Maintenance  Topic Date Due   Yearly kidney health urinalysis for diabetes  06/28/2016   Medicare Annual Wellness Visit  04/18/2023   COVID-19 Vaccine (3 - 2025-26 season) 03/24/2024   Pneumococcal Vaccine for age over 61 (2 of 2 - PCV) 03/14/2025*   Hepatitis C Screening  03/14/2025*   Complete foot exam   06/28/2024   Hemoglobin A1C  09/14/2024   Eye exam for diabetics  01/16/2025   Yearly kidney function blood test for diabetes  03/14/2025   Colon Cancer Screening  09/06/2028   DTaP/Tdap/Td vaccine (2 - Td or Tdap) 11/13/2032   Flu Shot  Completed   Zoster (Shingles) Vaccine  Completed   Meningitis B Vaccine  Aged Out  *Topic was postponed. The date shown is not the original due date.       06/25/2024    2:49 PM  Advanced Directives  Does Patient Have a Medical Advance Directive? Yes  Type of Advance Directive Healthcare Power of Attorney  Does patient want to make changes to medical advance directive? No - Patient declined  Copy of Healthcare Power of Attorney in Chart? Yes - validated most recent copy scanned in chart (See row information)   Information on Advanced Care  Planning can be found at [place]  Secretary of [facility] Advance Health Care Directives Advance Health Care Directives ([email or web address]   Vision: Annual vision screenings are recommended for early detection of glaucoma, cataracts, and diabetic retinopathy. These exams can also reveal signs of chronic conditions such as diabetes and high blood pressure.  Dental: Annual dental screenings help detect early signs of oral cancer, gum disease, and other conditions linked to overall health, including heart disease and diabetes.  Please see the attached documents for additional preventive care recommendations.

## 2024-06-25 NOTE — Telephone Encounter (Signed)
 PAP: Application for Trulicity has been submitted to [employer], via fax

## 2024-06-30 NOTE — Telephone Encounter (Signed)
 PAP: Patient assistance application for Trulicity  has been approved by PAP Companies: [employer] from 07/24/2024 to 07/23/2025. Medication should be delivered to PAP Delivery: Home. For further shipping updates, please contact [employer] at [phone]. Patient ID is: [id number]

## 2024-08-20 ENCOUNTER — Other Ambulatory Visit: Payer: Self-pay | Admitting: Family Medicine

## 2024-08-20 DIAGNOSIS — I10 Essential (primary) hypertension: Secondary | ICD-10-CM
